# Patient Record
Sex: Female | Born: 1970 | Race: White | Hispanic: No | Smoking: Current every day smoker
Health system: Southern US, Community
[De-identification: ages and names within clinical notes are randomized; demographics above are authoritative.]

## PROBLEM LIST (undated history)

## (undated) DIAGNOSIS — F32A Depression, unspecified: Secondary | ICD-10-CM

## (undated) DIAGNOSIS — F329 Major depressive disorder, single episode, unspecified: Secondary | ICD-10-CM

## (undated) DIAGNOSIS — I214 Non-ST elevation (NSTEMI) myocardial infarction: Secondary | ICD-10-CM

## (undated) DIAGNOSIS — K08409 Partial loss of teeth, unspecified cause, unspecified class: Secondary | ICD-10-CM

## (undated) DIAGNOSIS — Z9889 Other specified postprocedural states: Secondary | ICD-10-CM

## (undated) DIAGNOSIS — F319 Bipolar disorder, unspecified: Secondary | ICD-10-CM

## (undated) DIAGNOSIS — J449 Chronic obstructive pulmonary disease, unspecified: Secondary | ICD-10-CM

## (undated) DIAGNOSIS — Z5189 Encounter for other specified aftercare: Secondary | ICD-10-CM

## (undated) DIAGNOSIS — F419 Anxiety disorder, unspecified: Secondary | ICD-10-CM

## (undated) DIAGNOSIS — K219 Gastro-esophageal reflux disease without esophagitis: Secondary | ICD-10-CM

## (undated) HISTORY — PX: OTHER SURGICAL HISTORY: SHX169

---

## 2001-05-16 ENCOUNTER — Other Ambulatory Visit: Admission: RE | Admit: 2001-05-16 | Discharge: 2001-05-16 | Payer: Self-pay | Admitting: Obstetrics and Gynecology

## 2001-10-25 ENCOUNTER — Ambulatory Visit (HOSPITAL_COMMUNITY): Admission: RE | Admit: 2001-10-25 | Discharge: 2001-10-25 | Payer: Self-pay | Admitting: Obstetrics and Gynecology

## 2001-11-22 ENCOUNTER — Ambulatory Visit (HOSPITAL_COMMUNITY): Admission: RE | Admit: 2001-11-22 | Discharge: 2001-11-22 | Payer: Self-pay | Admitting: Obstetrics and Gynecology

## 2001-12-05 ENCOUNTER — Inpatient Hospital Stay (HOSPITAL_COMMUNITY): Admission: AD | Admit: 2001-12-05 | Discharge: 2001-12-07 | Payer: Self-pay | Admitting: Obstetrics & Gynecology

## 2002-12-04 ENCOUNTER — Ambulatory Visit (HOSPITAL_COMMUNITY): Admission: RE | Admit: 2002-12-04 | Discharge: 2002-12-04 | Payer: Self-pay | Admitting: Family Medicine

## 2002-12-04 ENCOUNTER — Encounter: Payer: Self-pay | Admitting: Family Medicine

## 2004-01-08 ENCOUNTER — Ambulatory Visit (HOSPITAL_COMMUNITY): Admission: RE | Admit: 2004-01-08 | Discharge: 2004-01-08 | Payer: Self-pay | Admitting: Family Medicine

## 2004-01-23 ENCOUNTER — Ambulatory Visit (HOSPITAL_COMMUNITY): Admission: RE | Admit: 2004-01-23 | Discharge: 2004-01-23 | Payer: Self-pay | Admitting: Family Medicine

## 2004-06-22 DIAGNOSIS — Z5189 Encounter for other specified aftercare: Secondary | ICD-10-CM

## 2004-06-22 DIAGNOSIS — IMO0001 Reserved for inherently not codable concepts without codable children: Secondary | ICD-10-CM

## 2004-06-22 HISTORY — DX: Encounter for other specified aftercare: Z51.89

## 2004-06-22 HISTORY — DX: Reserved for inherently not codable concepts without codable children: IMO0001

## 2004-07-09 ENCOUNTER — Ambulatory Visit (HOSPITAL_COMMUNITY): Admission: AD | Admit: 2004-07-09 | Discharge: 2004-07-09 | Payer: Self-pay | Admitting: Obstetrics and Gynecology

## 2004-08-07 ENCOUNTER — Observation Stay (HOSPITAL_COMMUNITY): Admission: AD | Admit: 2004-08-07 | Discharge: 2004-08-08 | Payer: Self-pay | Admitting: Obstetrics and Gynecology

## 2004-08-19 ENCOUNTER — Ambulatory Visit (HOSPITAL_COMMUNITY): Admission: AD | Admit: 2004-08-19 | Discharge: 2004-08-19 | Payer: Self-pay | Admitting: Obstetrics and Gynecology

## 2004-09-01 ENCOUNTER — Observation Stay (HOSPITAL_COMMUNITY): Admission: AD | Admit: 2004-09-01 | Discharge: 2004-09-02 | Payer: Self-pay | Admitting: Obstetrics and Gynecology

## 2004-09-24 ENCOUNTER — Ambulatory Visit (HOSPITAL_COMMUNITY): Admission: RE | Admit: 2004-09-24 | Discharge: 2004-09-24 | Payer: Self-pay | Admitting: Obstetrics and Gynecology

## 2004-10-08 ENCOUNTER — Inpatient Hospital Stay (HOSPITAL_COMMUNITY): Admission: AD | Admit: 2004-10-08 | Discharge: 2004-10-10 | Payer: Self-pay | Admitting: Obstetrics and Gynecology

## 2004-11-09 ENCOUNTER — Emergency Department (HOSPITAL_COMMUNITY): Admission: EM | Admit: 2004-11-09 | Discharge: 2004-11-09 | Payer: Self-pay | Admitting: Emergency Medicine

## 2004-11-14 ENCOUNTER — Ambulatory Visit: Payer: Self-pay | Admitting: Family Medicine

## 2004-11-27 ENCOUNTER — Ambulatory Visit: Payer: Self-pay | Admitting: Orthopedic Surgery

## 2004-12-02 ENCOUNTER — Encounter (HOSPITAL_COMMUNITY): Admission: RE | Admit: 2004-12-02 | Discharge: 2005-01-01 | Payer: Self-pay | Admitting: Orthopedic Surgery

## 2004-12-25 ENCOUNTER — Ambulatory Visit: Payer: Self-pay | Admitting: Orthopedic Surgery

## 2005-01-06 ENCOUNTER — Encounter (HOSPITAL_COMMUNITY): Admission: RE | Admit: 2005-01-06 | Discharge: 2005-02-05 | Payer: Self-pay | Admitting: Orthopedic Surgery

## 2005-01-08 ENCOUNTER — Ambulatory Visit: Payer: Self-pay | Admitting: Family Medicine

## 2005-01-20 HISTORY — PX: ORIF PROXIMAL HUMERUS FRACTURE: SUR951

## 2005-02-05 ENCOUNTER — Ambulatory Visit: Payer: Self-pay | Admitting: Orthopedic Surgery

## 2005-02-11 ENCOUNTER — Encounter: Payer: Self-pay | Admitting: Orthopedic Surgery

## 2005-02-11 ENCOUNTER — Observation Stay (HOSPITAL_COMMUNITY): Admission: RE | Admit: 2005-02-11 | Discharge: 2005-02-12 | Payer: Self-pay | Admitting: Orthopedic Surgery

## 2005-02-12 ENCOUNTER — Ambulatory Visit: Payer: Self-pay | Admitting: Orthopedic Surgery

## 2005-02-16 ENCOUNTER — Ambulatory Visit: Payer: Self-pay | Admitting: Orthopedic Surgery

## 2005-02-18 ENCOUNTER — Encounter (HOSPITAL_COMMUNITY): Admission: RE | Admit: 2005-02-18 | Discharge: 2005-03-20 | Payer: Self-pay | Admitting: Orthopedic Surgery

## 2005-02-25 ENCOUNTER — Ambulatory Visit: Payer: Self-pay | Admitting: Orthopedic Surgery

## 2005-03-23 ENCOUNTER — Encounter (HOSPITAL_COMMUNITY): Admission: RE | Admit: 2005-03-23 | Discharge: 2005-04-22 | Payer: Self-pay | Admitting: Orthopedic Surgery

## 2005-04-01 ENCOUNTER — Ambulatory Visit: Payer: Self-pay | Admitting: Orthopedic Surgery

## 2005-04-28 ENCOUNTER — Encounter (HOSPITAL_COMMUNITY): Admission: RE | Admit: 2005-04-28 | Discharge: 2005-05-28 | Payer: Self-pay | Admitting: Orthopedic Surgery

## 2005-05-13 ENCOUNTER — Ambulatory Visit: Payer: Self-pay | Admitting: Orthopedic Surgery

## 2005-06-30 ENCOUNTER — Encounter (HOSPITAL_COMMUNITY): Admission: RE | Admit: 2005-06-30 | Discharge: 2005-07-30 | Payer: Self-pay | Admitting: Orthopedic Surgery

## 2005-08-10 ENCOUNTER — Ambulatory Visit: Payer: Self-pay | Admitting: Orthopedic Surgery

## 2005-09-09 ENCOUNTER — Ambulatory Visit: Payer: Self-pay | Admitting: Orthopedic Surgery

## 2005-10-16 ENCOUNTER — Ambulatory Visit (HOSPITAL_COMMUNITY): Admission: RE | Admit: 2005-10-16 | Discharge: 2005-10-16 | Payer: Self-pay | Admitting: Family Medicine

## 2005-10-16 ENCOUNTER — Ambulatory Visit: Payer: Self-pay | Admitting: Family Medicine

## 2006-03-08 ENCOUNTER — Ambulatory Visit: Payer: Self-pay | Admitting: Orthopedic Surgery

## 2006-03-22 DIAGNOSIS — K08409 Partial loss of teeth, unspecified cause, unspecified class: Secondary | ICD-10-CM

## 2006-03-22 HISTORY — PX: MULTIPLE TOOTH EXTRACTIONS: SHX2053

## 2006-03-22 HISTORY — DX: Partial loss of teeth, unspecified cause, unspecified class: K08.409

## 2006-04-22 ENCOUNTER — Ambulatory Visit: Payer: Self-pay | Admitting: Orthopedic Surgery

## 2006-04-27 ENCOUNTER — Ambulatory Visit: Payer: Self-pay | Admitting: Orthopedic Surgery

## 2006-04-27 ENCOUNTER — Ambulatory Visit (HOSPITAL_COMMUNITY): Admission: RE | Admit: 2006-04-27 | Discharge: 2006-04-27 | Payer: Self-pay | Admitting: Orthopedic Surgery

## 2006-04-27 HISTORY — PX: OTHER SURGICAL HISTORY: SHX169

## 2006-04-29 ENCOUNTER — Ambulatory Visit: Payer: Self-pay | Admitting: Orthopedic Surgery

## 2006-05-06 ENCOUNTER — Ambulatory Visit: Payer: Self-pay | Admitting: Orthopedic Surgery

## 2006-06-29 ENCOUNTER — Ambulatory Visit: Payer: Self-pay | Admitting: Orthopedic Surgery

## 2006-07-02 ENCOUNTER — Encounter (HOSPITAL_COMMUNITY): Admission: RE | Admit: 2006-07-02 | Discharge: 2006-08-01 | Payer: Self-pay | Admitting: Orthopedic Surgery

## 2006-08-02 ENCOUNTER — Encounter (HOSPITAL_COMMUNITY): Admission: RE | Admit: 2006-08-02 | Discharge: 2006-09-01 | Payer: Self-pay | Admitting: Orthopedic Surgery

## 2006-08-24 ENCOUNTER — Ambulatory Visit: Payer: Self-pay | Admitting: Orthopedic Surgery

## 2006-09-08 ENCOUNTER — Ambulatory Visit: Admission: RE | Admit: 2006-09-08 | Discharge: 2006-09-08 | Payer: Self-pay | Admitting: Orthopedic Surgery

## 2006-10-20 ENCOUNTER — Ambulatory Visit: Payer: Self-pay | Admitting: Orthopedic Surgery

## 2007-01-26 ENCOUNTER — Ambulatory Visit: Payer: Self-pay | Admitting: Family Medicine

## 2007-02-09 ENCOUNTER — Ambulatory Visit: Payer: Self-pay | Admitting: Orthopedic Surgery

## 2007-02-11 ENCOUNTER — Ambulatory Visit (HOSPITAL_COMMUNITY): Admission: RE | Admit: 2007-02-11 | Discharge: 2007-02-11 | Payer: Self-pay | Admitting: Orthopedic Surgery

## 2007-02-17 ENCOUNTER — Ambulatory Visit: Payer: Self-pay | Admitting: Orthopedic Surgery

## 2007-02-23 ENCOUNTER — Ambulatory Visit: Payer: Self-pay | Admitting: Family Medicine

## 2007-03-02 ENCOUNTER — Encounter (HOSPITAL_COMMUNITY): Admission: RE | Admit: 2007-03-02 | Discharge: 2007-03-22 | Payer: Self-pay | Admitting: Family Medicine

## 2007-03-24 ENCOUNTER — Encounter (HOSPITAL_COMMUNITY): Admission: RE | Admit: 2007-03-24 | Discharge: 2007-04-23 | Payer: Self-pay | Admitting: Family Medicine

## 2007-06-23 ENCOUNTER — Encounter: Payer: Self-pay | Admitting: Family Medicine

## 2007-11-28 ENCOUNTER — Ambulatory Visit: Payer: Self-pay | Admitting: Family Medicine

## 2007-12-02 ENCOUNTER — Encounter: Payer: Self-pay | Admitting: Family Medicine

## 2007-12-02 DIAGNOSIS — M542 Cervicalgia: Secondary | ICD-10-CM | POA: Insufficient documentation

## 2007-12-02 DIAGNOSIS — F411 Generalized anxiety disorder: Secondary | ICD-10-CM | POA: Insufficient documentation

## 2007-12-02 DIAGNOSIS — F319 Bipolar disorder, unspecified: Secondary | ICD-10-CM

## 2007-12-02 LAB — CONVERTED CEMR LAB
BUN: 8 mg/dL (ref 6–23)
CO2: 26 meq/L (ref 19–32)
Calcium: 9.2 mg/dL (ref 8.4–10.5)
Chloride: 102 meq/L (ref 96–112)
Creatinine, Ser: 0.7 mg/dL (ref 0.40–1.20)
Glucose, Bld: 111 mg/dL — ABNORMAL HIGH (ref 70–99)

## 2007-12-07 ENCOUNTER — Ambulatory Visit (HOSPITAL_COMMUNITY): Admission: RE | Admit: 2007-12-07 | Discharge: 2007-12-07 | Payer: Self-pay | Admitting: Family Medicine

## 2007-12-12 ENCOUNTER — Encounter (HOSPITAL_COMMUNITY): Admission: RE | Admit: 2007-12-12 | Discharge: 2008-01-11 | Payer: Self-pay | Admitting: Family Medicine

## 2008-02-07 ENCOUNTER — Ambulatory Visit: Payer: Self-pay | Admitting: Orthopedic Surgery

## 2008-02-07 DIAGNOSIS — S42213A Unspecified displaced fracture of surgical neck of unspecified humerus, initial encounter for closed fracture: Secondary | ICD-10-CM

## 2008-02-07 DIAGNOSIS — G5620 Lesion of ulnar nerve, unspecified upper limb: Secondary | ICD-10-CM

## 2008-02-07 DIAGNOSIS — M25529 Pain in unspecified elbow: Secondary | ICD-10-CM | POA: Insufficient documentation

## 2008-02-08 ENCOUNTER — Ambulatory Visit (HOSPITAL_COMMUNITY): Admission: RE | Admit: 2008-02-08 | Discharge: 2008-02-08 | Payer: Self-pay | Admitting: Orthopedic Surgery

## 2008-02-08 ENCOUNTER — Encounter: Payer: Self-pay | Admitting: Orthopedic Surgery

## 2008-02-16 ENCOUNTER — Other Ambulatory Visit: Admission: RE | Admit: 2008-02-16 | Discharge: 2008-02-16 | Payer: Self-pay | Admitting: Obstetrics and Gynecology

## 2008-02-21 ENCOUNTER — Encounter: Payer: Self-pay | Admitting: Orthopedic Surgery

## 2008-02-21 ENCOUNTER — Telehealth: Payer: Self-pay | Admitting: Orthopedic Surgery

## 2008-02-21 ENCOUNTER — Ambulatory Visit (HOSPITAL_COMMUNITY): Admission: RE | Admit: 2008-02-21 | Discharge: 2008-02-21 | Payer: Self-pay | Admitting: Orthopedic Surgery

## 2008-03-12 ENCOUNTER — Ambulatory Visit: Payer: Self-pay | Admitting: Orthopedic Surgery

## 2008-04-25 ENCOUNTER — Ambulatory Visit: Payer: Self-pay | Admitting: Orthopedic Surgery

## 2008-04-25 ENCOUNTER — Telehealth: Payer: Self-pay | Admitting: Orthopedic Surgery

## 2008-04-25 DIAGNOSIS — M7710 Lateral epicondylitis, unspecified elbow: Secondary | ICD-10-CM | POA: Insufficient documentation

## 2008-05-23 ENCOUNTER — Telehealth: Payer: Self-pay | Admitting: Orthopedic Surgery

## 2008-06-13 ENCOUNTER — Ambulatory Visit: Payer: Self-pay | Admitting: Orthopedic Surgery

## 2008-07-16 ENCOUNTER — Telehealth: Payer: Self-pay | Admitting: Orthopedic Surgery

## 2008-07-23 ENCOUNTER — Ambulatory Visit: Payer: Self-pay | Admitting: Family Medicine

## 2008-07-23 DIAGNOSIS — J209 Acute bronchitis, unspecified: Secondary | ICD-10-CM

## 2008-07-23 DIAGNOSIS — J019 Acute sinusitis, unspecified: Secondary | ICD-10-CM

## 2008-07-24 ENCOUNTER — Ambulatory Visit (HOSPITAL_COMMUNITY): Admission: RE | Admit: 2008-07-24 | Discharge: 2008-07-24 | Payer: Self-pay | Admitting: Family Medicine

## 2008-07-30 ENCOUNTER — Telehealth: Payer: Self-pay | Admitting: Family Medicine

## 2008-08-15 ENCOUNTER — Telehealth: Payer: Self-pay | Admitting: Orthopedic Surgery

## 2008-08-17 ENCOUNTER — Inpatient Hospital Stay (HOSPITAL_COMMUNITY): Admission: EM | Admit: 2008-08-17 | Discharge: 2008-08-25 | Payer: Self-pay | Admitting: Emergency Medicine

## 2008-08-17 ENCOUNTER — Ambulatory Visit: Payer: Self-pay | Admitting: Family Medicine

## 2008-08-17 DIAGNOSIS — R1011 Right upper quadrant pain: Secondary | ICD-10-CM | POA: Insufficient documentation

## 2008-08-22 ENCOUNTER — Ambulatory Visit: Payer: Self-pay | Admitting: Gastroenterology

## 2008-08-22 ENCOUNTER — Encounter: Payer: Self-pay | Admitting: Family Medicine

## 2008-08-23 ENCOUNTER — Ambulatory Visit: Payer: Self-pay | Admitting: Internal Medicine

## 2008-08-24 ENCOUNTER — Encounter: Payer: Self-pay | Admitting: Gastroenterology

## 2008-08-27 ENCOUNTER — Encounter: Payer: Self-pay | Admitting: Gastroenterology

## 2008-08-31 ENCOUNTER — Encounter: Payer: Self-pay | Admitting: Family Medicine

## 2008-08-31 ENCOUNTER — Encounter: Payer: Self-pay | Admitting: Orthopedic Surgery

## 2008-09-04 ENCOUNTER — Encounter: Payer: Self-pay | Admitting: Gastroenterology

## 2008-09-13 ENCOUNTER — Ambulatory Visit: Payer: Self-pay | Admitting: Gastroenterology

## 2008-09-13 DIAGNOSIS — R197 Diarrhea, unspecified: Secondary | ICD-10-CM

## 2008-09-13 DIAGNOSIS — R740 Nonspecific elevation of levels of transaminase and lactic acid dehydrogenase [LDH]: Secondary | ICD-10-CM

## 2008-09-13 DIAGNOSIS — R1084 Generalized abdominal pain: Secondary | ICD-10-CM

## 2008-09-13 DIAGNOSIS — R109 Unspecified abdominal pain: Secondary | ICD-10-CM | POA: Insufficient documentation

## 2008-09-13 DIAGNOSIS — D649 Anemia, unspecified: Secondary | ICD-10-CM

## 2008-09-14 ENCOUNTER — Encounter: Payer: Self-pay | Admitting: Gastroenterology

## 2008-09-18 ENCOUNTER — Ambulatory Visit (HOSPITAL_COMMUNITY): Admission: RE | Admit: 2008-09-18 | Discharge: 2008-09-18 | Payer: Self-pay | Admitting: Pulmonary Disease

## 2008-09-20 ENCOUNTER — Ambulatory Visit: Payer: Self-pay | Admitting: Gastroenterology

## 2008-09-20 ENCOUNTER — Ambulatory Visit (HOSPITAL_COMMUNITY): Admission: RE | Admit: 2008-09-20 | Discharge: 2008-09-20 | Payer: Self-pay | Admitting: Gastroenterology

## 2008-09-20 ENCOUNTER — Encounter: Payer: Self-pay | Admitting: Gastroenterology

## 2008-09-20 LAB — CONVERTED CEMR LAB
ALT: 13 units/L (ref 0–35)
AST: 19 units/L (ref 0–37)
Albumin: 4.6 g/dL (ref 3.5–5.2)
Alkaline Phosphatase: 71 units/L (ref 39–117)
CD4 T Helper %: 57 % (ref 32–62)
Total Bilirubin: 0.3 mg/dL (ref 0.3–1.2)
WBC, lymph enumeration: 10.4 10*3/uL (ref 4.0–10.5)

## 2009-01-16 ENCOUNTER — Ambulatory Visit: Payer: Self-pay | Admitting: Gastroenterology

## 2009-01-16 DIAGNOSIS — Z8639 Personal history of other endocrine, nutritional and metabolic disease: Secondary | ICD-10-CM

## 2009-01-16 DIAGNOSIS — Z862 Personal history of diseases of the blood and blood-forming organs and certain disorders involving the immune mechanism: Secondary | ICD-10-CM

## 2009-01-21 ENCOUNTER — Encounter: Payer: Self-pay | Admitting: Gastroenterology

## 2009-01-22 ENCOUNTER — Encounter: Payer: Self-pay | Admitting: Gastroenterology

## 2009-01-22 DIAGNOSIS — R7989 Other specified abnormal findings of blood chemistry: Secondary | ICD-10-CM | POA: Insufficient documentation

## 2009-01-22 LAB — CONVERTED CEMR LAB
AST: 13 units/L (ref 0–37)
Alkaline Phosphatase: 64 units/L (ref 39–117)
Basophils Relative: 1 % (ref 0–1)
Eosinophils Absolute: 0 10*3/uL (ref 0.0–0.7)
Hemoglobin: 13.3 g/dL (ref 12.0–15.0)
Lymphs Abs: 2 10*3/uL (ref 0.7–4.0)
MCHC: 33.3 g/dL (ref 30.0–36.0)
MCV: 101.8 fL — ABNORMAL HIGH (ref 78.0–100.0)
Monocytes Absolute: 0.4 10*3/uL (ref 0.1–1.0)
Monocytes Relative: 7 % (ref 3–12)
Neutro Abs: 4.1 10*3/uL (ref 1.7–7.7)
Neutrophils Relative %: 63 % (ref 43–77)
RBC: 3.93 M/uL (ref 3.87–5.11)
Total Bilirubin: 0.2 mg/dL — ABNORMAL LOW (ref 0.3–1.2)
WBC: 6.6 10*3/uL (ref 4.0–10.5)

## 2009-03-22 ENCOUNTER — Emergency Department (HOSPITAL_COMMUNITY): Admission: EM | Admit: 2009-03-22 | Discharge: 2009-03-22 | Payer: Self-pay | Admitting: Emergency Medicine

## 2009-04-05 ENCOUNTER — Ambulatory Visit (HOSPITAL_COMMUNITY): Admission: RE | Admit: 2009-04-05 | Discharge: 2009-04-05 | Payer: Self-pay | Admitting: Obstetrics and Gynecology

## 2009-04-05 ENCOUNTER — Other Ambulatory Visit: Admission: RE | Admit: 2009-04-05 | Discharge: 2009-04-05 | Payer: Self-pay | Admitting: Obstetrics and Gynecology

## 2010-03-08 IMAGING — CT CT ANGIO CHEST
1 of 6 series · 19 of 36 positions shown · IV contrast (Omnipaque 300)
Comparison: Plain films chest 08/17/2008 and 10/16/2005.

CLINICAL DATA: Right-sided chest pain.  Pneumonia.  Tobacco abuse.

CT ANGIOGRAPHY CHEST
TECHNIQUE: Multidetector CT imaging of the chest using the
standard protocol during bolus administration of intravenous
contrast. Multiplanar reconstructed images including MIPs were
obtained and reviewed to evaluate the vascular anatomy.
Contrast: 80 ml Smnipaque-AQQ.

[Series 11: thin pacs 1mm · axial · 0.59mm/px · z∈[-266,+12]mm · 19 of 310 slices shown]
[im 16/310  lung]
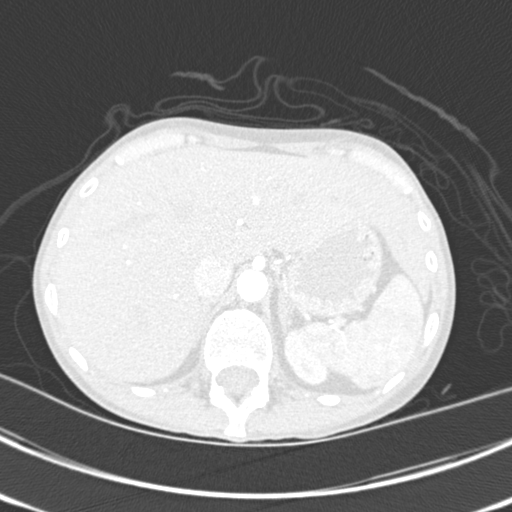
[im 31/310  mediastinal]
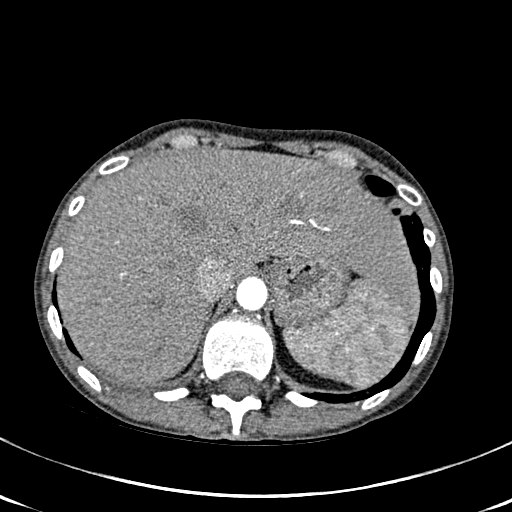
[im 47/310  lung]
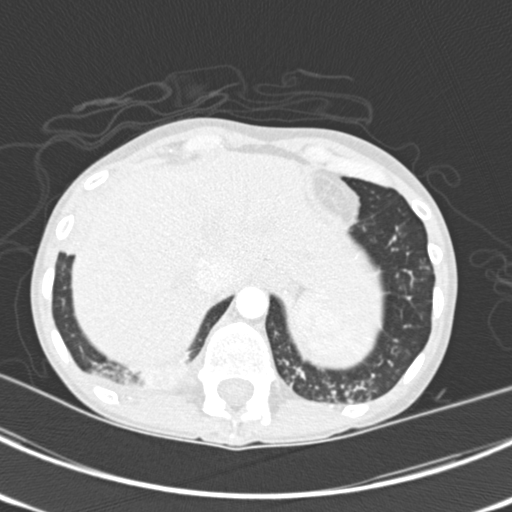
[im 62/310  mediastinal]
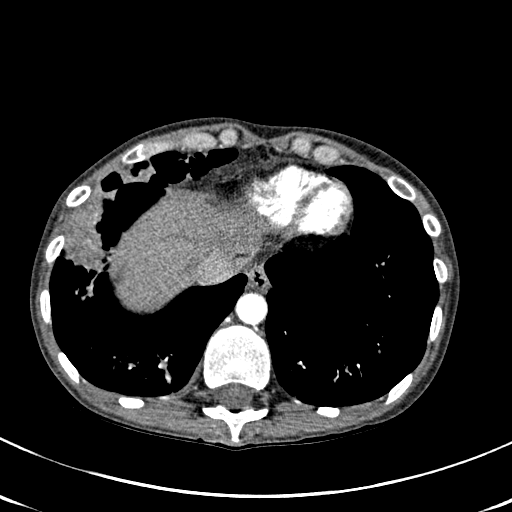
[im 78/310  lung]
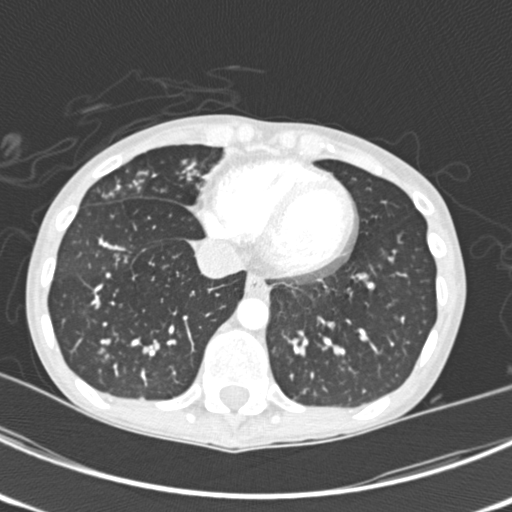
[im 93/310  mediastinal]
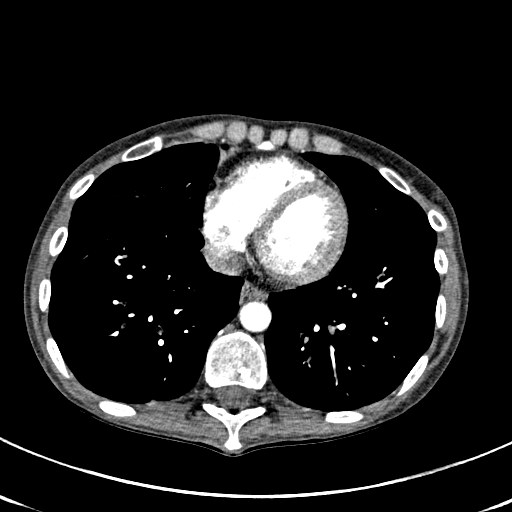
[im 109/310  lung]
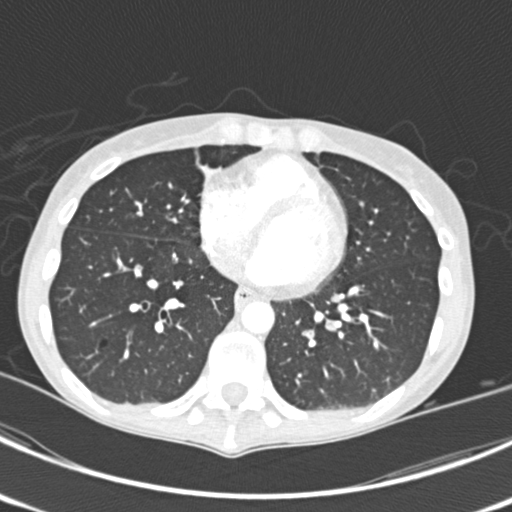
[im 124/310  mediastinal]
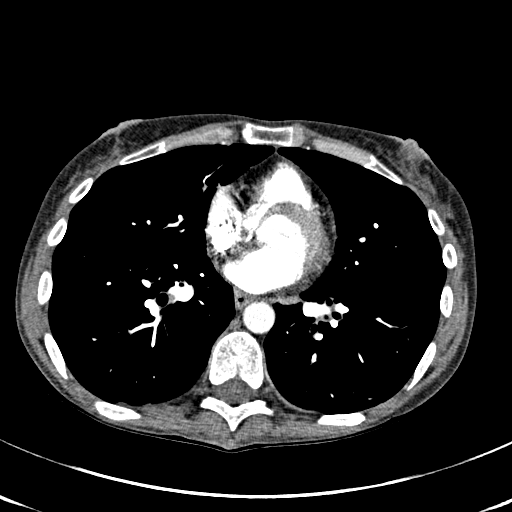
[im 140/310  lung]
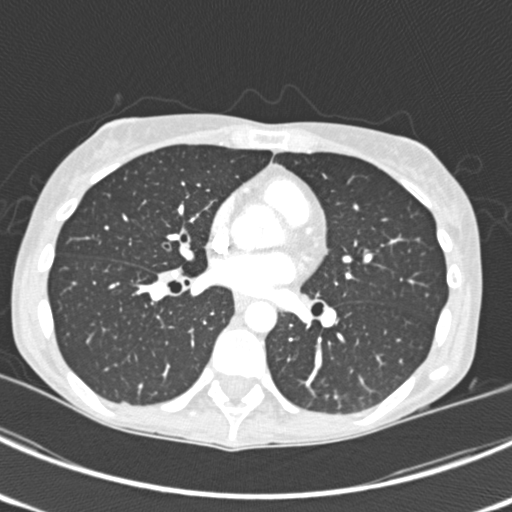
[im 155/310  mediastinal]
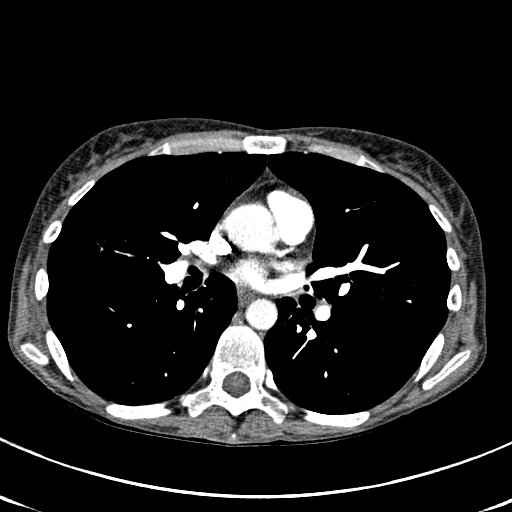
[im 170/310  lung]
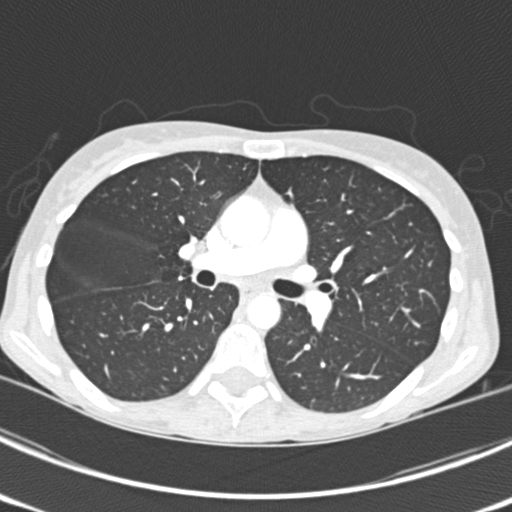
[im 186/310  mediastinal]
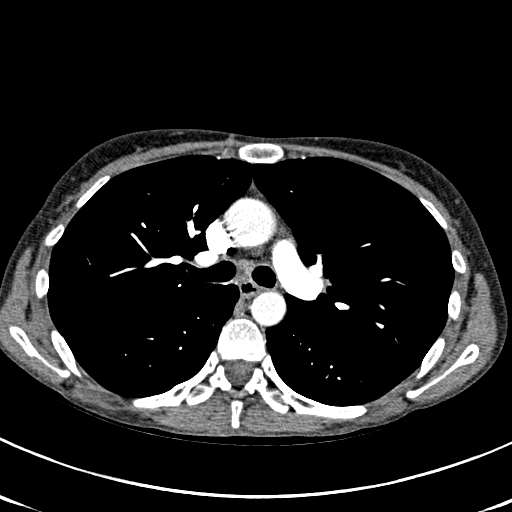
[im 201/310  lung]
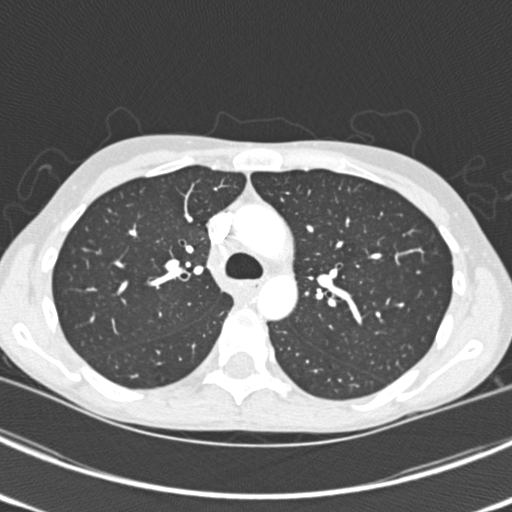
[im 217/310  mediastinal]
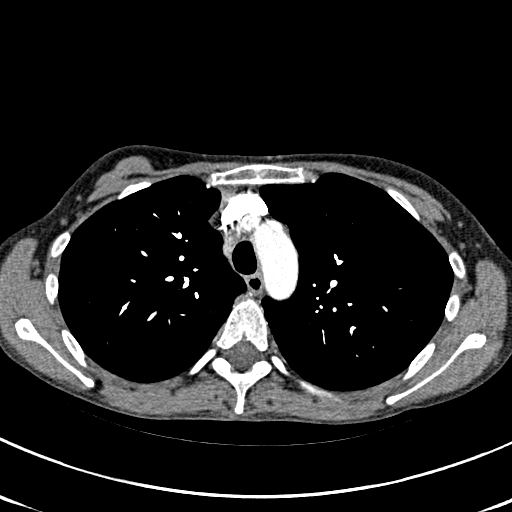
[im 232/310  lung]
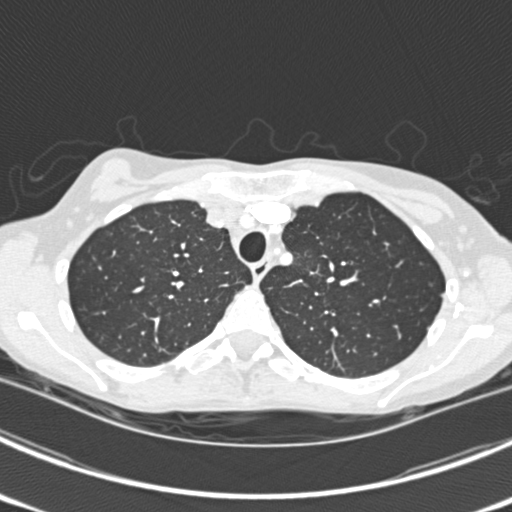
[im 248/310  mediastinal]
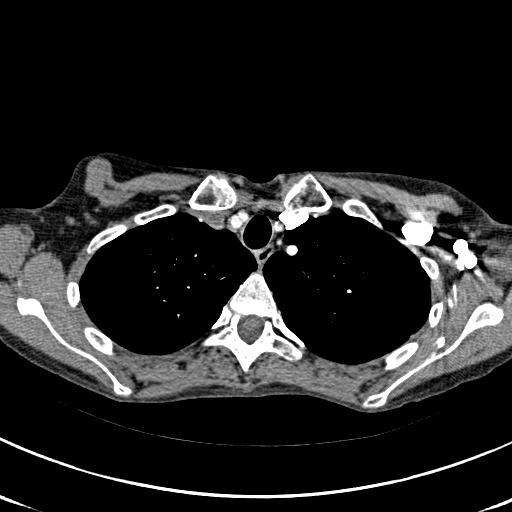
[im 263/310  lung]
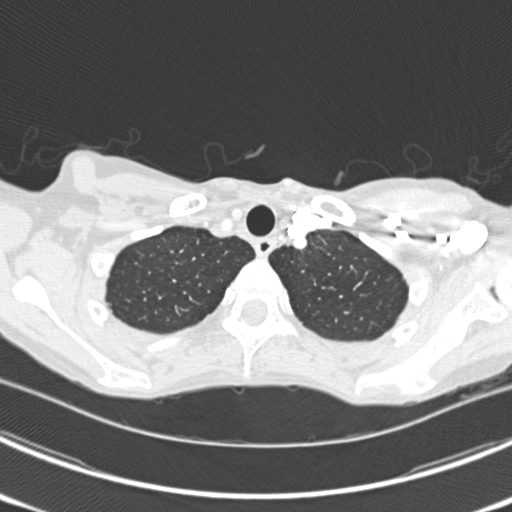
[im 279/310  mediastinal]
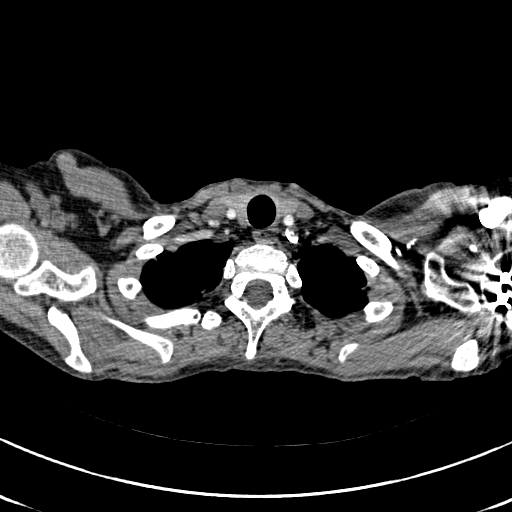
[im 294/310  lung]
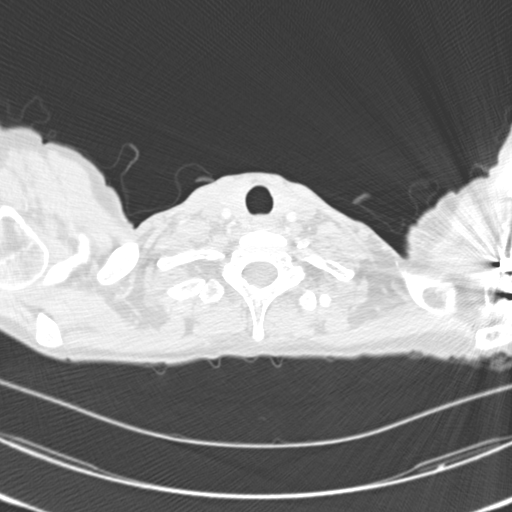

[19 of 36 positions shown; findings below may reference images not displayed]

FINDINGS: There is no CT evidence of pulmonary embolus.  No pleural
or pericardial effusion.  Heart size is normal.  No axillary, hilar
or mediastinal lymphadenopathy.

The lungs demonstrate changes of early emphysema.  There is
airspace disease in the right middle and lower lobes consistent
with pneumonia.  Mild degree of airspace opacity is seen in the
left lower lobe posteriorly.  Peribronchial thickening with mucous
plugging is best seen in the left lower lobe.

Incidentally imaged upper abdomen is unremarkable.
IMPRESSION: 1.  Negative for pulmonary embolus.
2.  Right middle lower lobe airspace disease consistent with
pneumonia.  Recommend plain film follow-up to clearing.
3.  Bronchitic change and mucus plugging best seen in the left
lower lobe.

## 2010-03-10 IMAGING — US US ABDOMEN COMPLETE
1 series · 13 of 25 positions shown · non-contrast
Comparison: 12/07/2007

CLINICAL DATA: Abdominal pain.

ABDOMEN ULTRASOUND
TECHNIQUE: Complete abdominal ultrasound examination was performed
including evaluation of the liver, gallbladder, bile ducts,
pancreas, kidneys, spleen, IVC, and abdominal aorta.

[Series 1: us abdomen complete · 0.28mm/px · 13 of 74 slices shown]
[im 1/74]
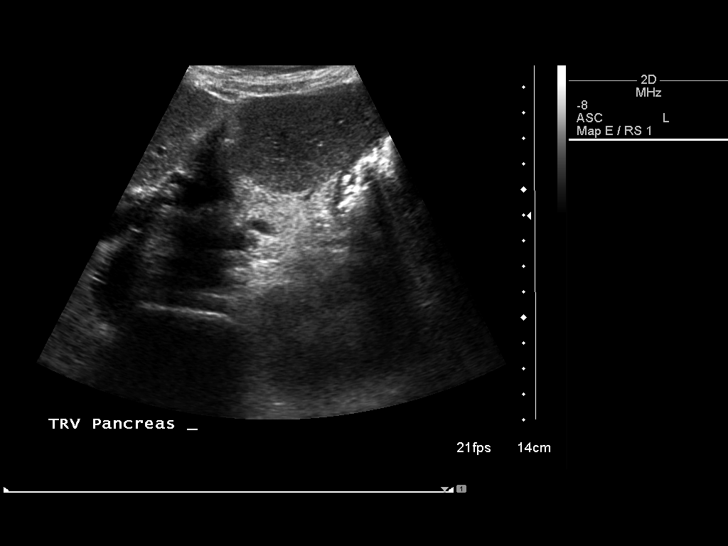
[im 7/74]
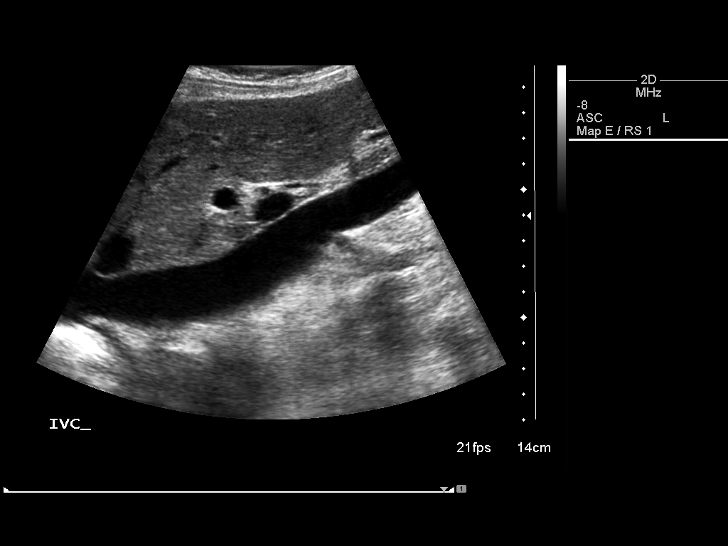
[im 13/74]
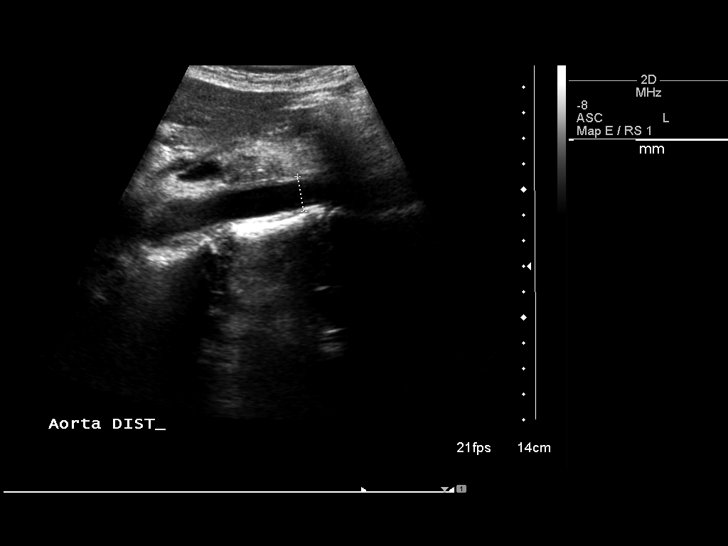
[im 19/74]
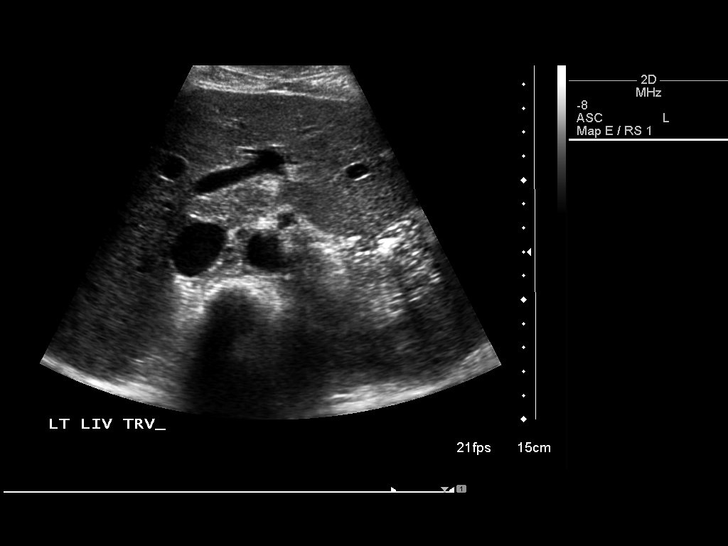
[im 25/74]
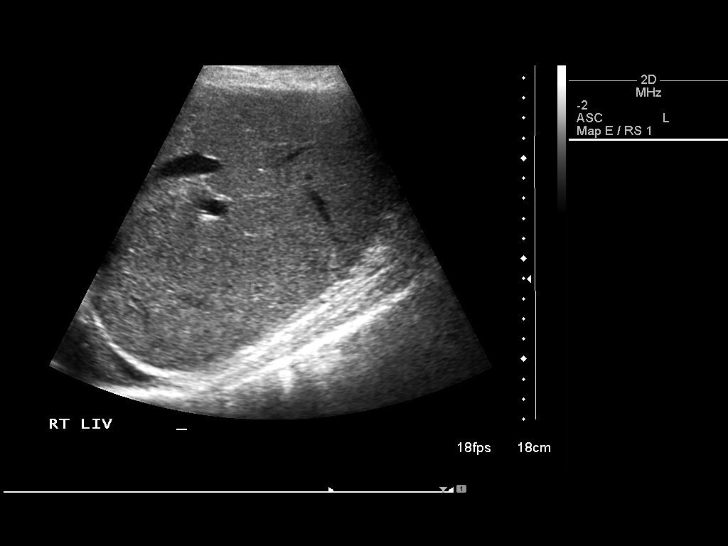
[im 31/74]
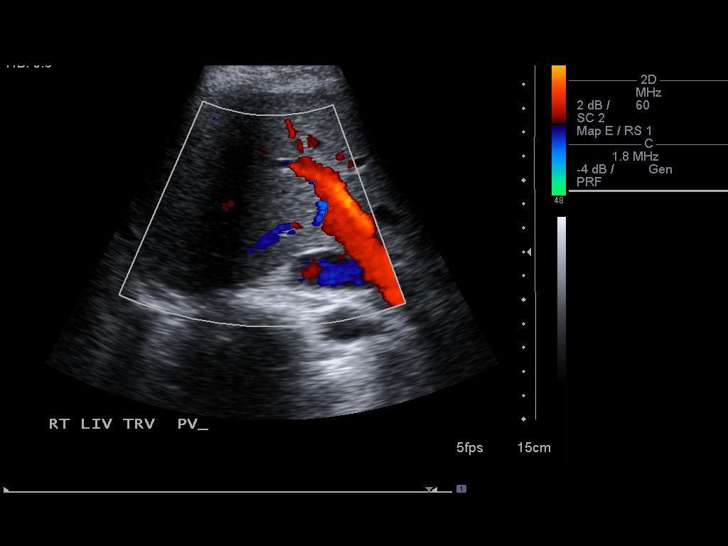
[im 37/74]
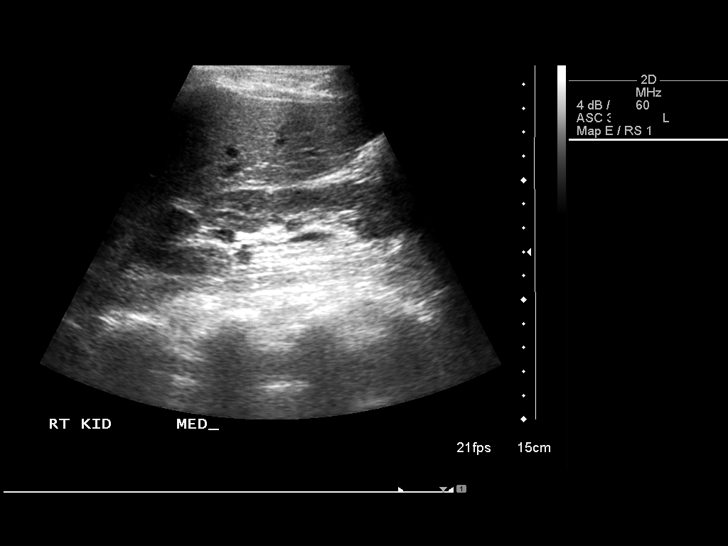
[im 43/74]
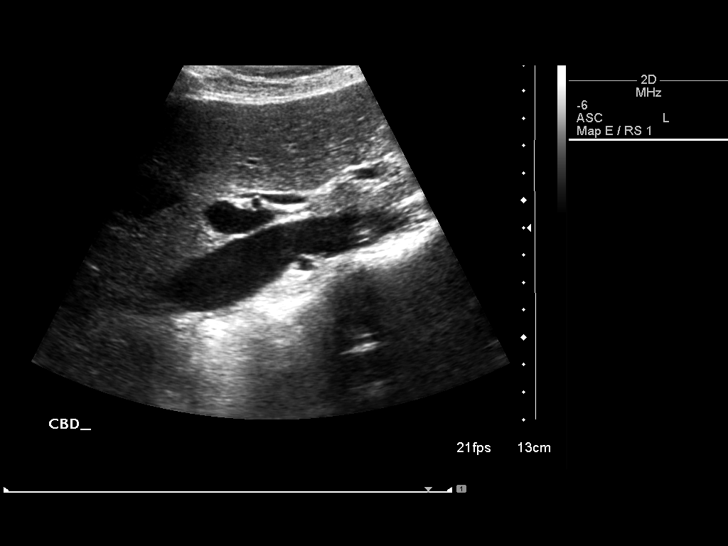
[im 49/74]
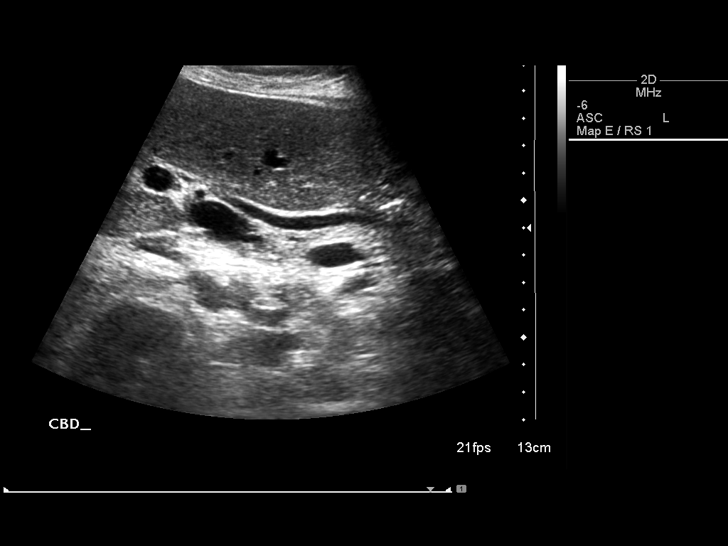
[im 55/74]
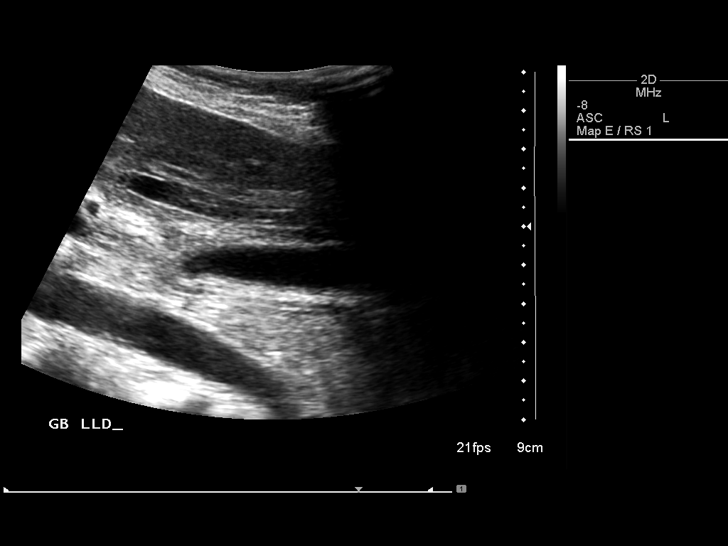
[im 61/74]
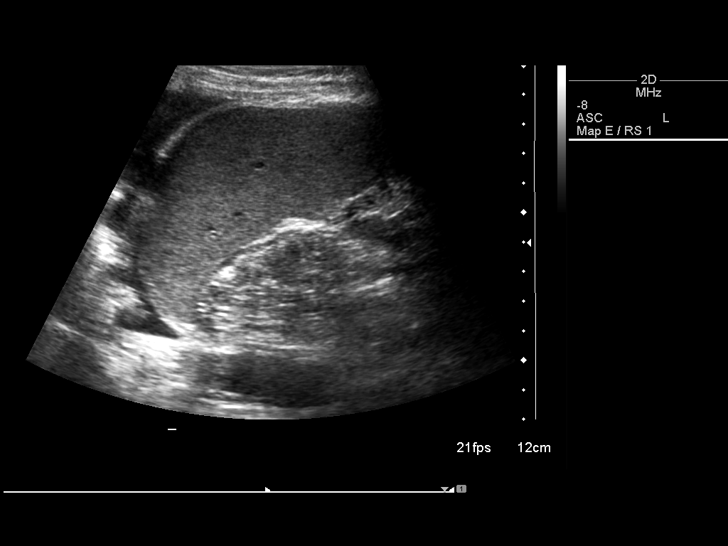
[im 67/74]
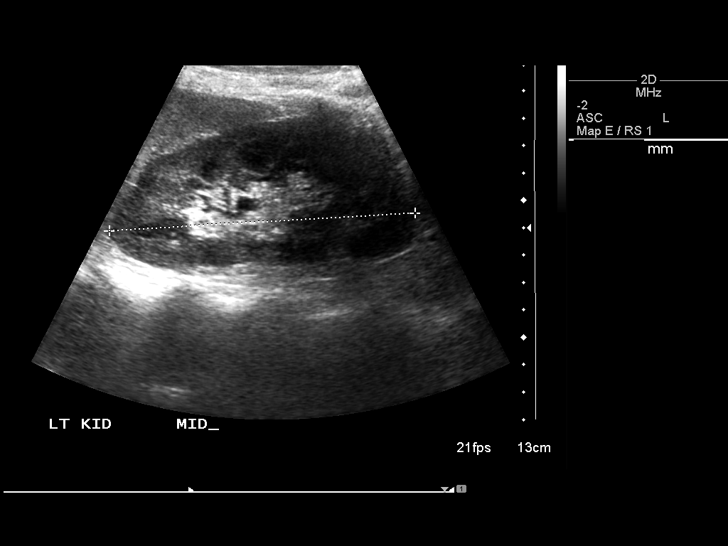
[im 74/74]
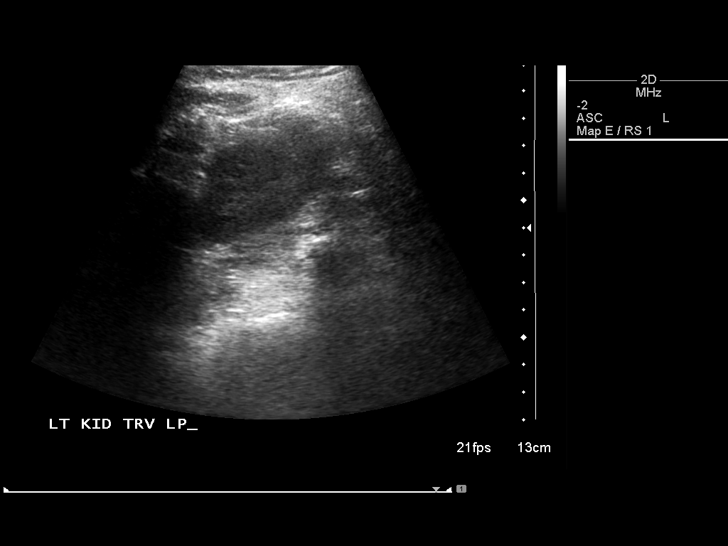

[13 of 25 positions shown; findings below may reference images not displayed]

FINDINGS: The liver is sonographically unremarkable.  No focal
lesions or intrahepatic biliary dilatation.  The common bile duct
is normal in caliber measuring 4.5mm.  The gallbladder appears
partially contracted.  The wall is mildly thickened measuring
mm.  No gallstones or pericholecystic fluid.  Negative sonographic
Murphy's sign.

The pancreas is fairly well visualized and demonstrates no
sonographic abnormalities.  The IVC and aorta are normal in
caliber.

The spleen is normal in size demonstrates normal echogenicity
without focal lesions.  The right kidney measures 12.8 cm and the
left kidney measures 11.2 cm.  Normal renal cortical thickness and
echogenicity.  A parapelvic cyst is noted on the left side.  No
hydronephrosis.
IMPRESSION: 1.  The contracted gallbladder with associated wall thickening.  No
gallstones or pericholecystic fluid.
2.  Normal caliber common bile duct.
3.  Normal sonographic appearance of the liver, pancreas and
spleen.
4.  Parapelvic cyst associated the left kidney.  No hydronephrosis.
5.  Bilateral pleural effusions.

## 2010-04-01 ENCOUNTER — Emergency Department (HOSPITAL_COMMUNITY): Admission: EM | Admit: 2010-04-01 | Discharge: 2010-04-01 | Payer: Self-pay | Admitting: Emergency Medicine

## 2010-07-24 NOTE — Letter (Signed)
Summary: demo  demo   Imported By: Lind Guest 02/05/2010 13:48:48  _____________________________________________________________________  External Attachment:    Type:   Image     Comment:   External Document

## 2010-07-24 NOTE — Letter (Signed)
Summary: history & physical  history & physical   Imported By: Lind Guest 02/05/2010 13:49:26  _____________________________________________________________________  External Attachment:    Type:   Image     Comment:   External Document

## 2010-07-24 NOTE — Letter (Signed)
Summary: phone notes  phone notes   Imported By: Lind Guest 02/05/2010 13:52:03  _____________________________________________________________________  External Attachment:    Type:   Image     Comment:   External Document

## 2010-07-24 NOTE — Letter (Signed)
Summary: misc  misc   Imported By: Lind Guest 02/05/2010 13:51:06  _____________________________________________________________________  External Attachment:    Type:   Image     Comment:   External Document

## 2010-07-24 NOTE — Letter (Signed)
Summary: office notes  office notes   Imported By: Lind Guest 02/05/2010 13:49:51  _____________________________________________________________________  External Attachment:    Type:   Image     Comment:   External Document

## 2010-07-24 NOTE — Letter (Signed)
Summary: labs  labs   Imported By: Lind Guest 02/05/2010 13:54:00  _____________________________________________________________________  External Attachment:    Type:   Image     Comment:   External Document

## 2010-07-24 NOTE — Letter (Signed)
Summary: x rays  x rays   Imported By: Lind Guest 02/05/2010 13:53:18  _____________________________________________________________________  External Attachment:    Type:   Image     Comment:   External Document

## 2010-07-24 NOTE — Letter (Signed)
Summary: consults  consults   Imported By: Lind Guest 02/05/2010 13:52:52  _____________________________________________________________________  External Attachment:    Type:   Image     Comment:   External Document

## 2010-09-25 LAB — DIFFERENTIAL
Basophils Relative: 0 % (ref 0–1)
Eosinophils Absolute: 0 10*3/uL (ref 0.0–0.7)
Eosinophils Relative: 0 % (ref 0–5)
Monocytes Absolute: 0.5 10*3/uL (ref 0.1–1.0)
Monocytes Relative: 5 % (ref 3–12)
Neutro Abs: 8.3 10*3/uL — ABNORMAL HIGH (ref 1.7–7.7)

## 2010-09-25 LAB — CBC
HCT: 34.1 % — ABNORMAL LOW (ref 36.0–46.0)
Hemoglobin: 12 g/dL (ref 12.0–15.0)
MCHC: 35.1 g/dL (ref 30.0–36.0)
MCV: 99.3 fL (ref 78.0–100.0)
RBC: 3.44 MIL/uL — ABNORMAL LOW (ref 3.87–5.11)
RDW: 13.5 % (ref 11.5–15.5)

## 2010-09-25 LAB — POCT CARDIAC MARKERS
CKMB, poc: <1 ng/mL — ABNORMAL LOW (ref 1.0–8.0)
Troponin i, poc: <0.05 ng/mL (ref 0.00–0.09)

## 2010-09-25 LAB — BASIC METABOLIC PANEL
CO2: 23 mEq/L (ref 19–32)
Chloride: 110 mEq/L (ref 96–112)
GFR calc Af Amer: >60 mL/min (ref >60)
Glucose, Bld: 79 mg/dL (ref 70–99)
Sodium: 141 mEq/L (ref 135–145)

## 2010-10-02 LAB — DIFFERENTIAL
Basophils Absolute: 0 10*3/uL (ref 0.0–0.1)
Basophils Absolute: 0 10*3/uL (ref 0.0–0.1)
Basophils Relative: 1 % (ref 0–1)
Basophils Relative: 1 % (ref 0–1)
Basophils Relative: 1 % (ref 0–1)
Eosinophils Absolute: 0.3 10*3/uL (ref 0.0–0.7)
Eosinophils Relative: 3 % (ref 0–5)
Eosinophils Relative: 5 % (ref 0–5)
Lymphocytes Relative: 31 % (ref 12–46)
Lymphs Abs: 1 10*3/uL (ref 0.7–4.0)
Monocytes Relative: 11 % (ref 3–12)
Monocytes Relative: 8 % (ref 3–12)
Neutro Abs: 2.8 10*3/uL (ref 1.7–7.7)
Neutro Abs: 4 10*3/uL (ref 1.7–7.7)
Neutrophils Relative %: 52 % (ref 43–77)
Neutrophils Relative %: 57 % (ref 43–77)
Neutrophils Relative %: 82 % — ABNORMAL HIGH (ref 43–77)

## 2010-10-02 LAB — FUNGUS CULTURE, BLOOD
Culture: NO GROWTH
Report Status: 3102010
Report Status: 3102010

## 2010-10-02 LAB — COMPREHENSIVE METABOLIC PANEL
ALT: 43 U/L — ABNORMAL HIGH (ref 0–35)
Alkaline Phosphatase: 120 U/L — ABNORMAL HIGH (ref 39–117)
Alkaline Phosphatase: 120 U/L — ABNORMAL HIGH (ref 39–117)
Alkaline Phosphatase: 147 U/L — ABNORMAL HIGH (ref 39–117)
BUN: 10 mg/dL (ref 6–23)
BUN: 6 mg/dL (ref 6–23)
CO2: 26 mEq/L (ref 19–32)
CO2: 26 mEq/L (ref 19–32)
Chloride: 102 mEq/L (ref 96–112)
Chloride: 108 mEq/L (ref 96–112)
Creatinine, Ser: 0.71 mg/dL (ref 0.4–1.2)
GFR calc non Af Amer: >60 mL/min (ref >60)
Glucose, Bld: 120 mg/dL — ABNORMAL HIGH (ref 70–99)
Glucose, Bld: 81 mg/dL (ref 70–99)
Glucose, Bld: 85 mg/dL (ref 70–99)
Potassium: 4.6 mEq/L (ref 3.5–5.1)
Potassium: 5.3 mEq/L — ABNORMAL HIGH (ref 3.5–5.1)
Potassium: 5.5 mEq/L — ABNORMAL HIGH (ref 3.5–5.1)
Sodium: 139 mEq/L (ref 135–145)
Total Bilirubin: 0.2 mg/dL — ABNORMAL LOW (ref 0.3–1.2)
Total Bilirubin: 0.3 mg/dL (ref 0.3–1.2)
Total Protein: 5.7 g/dL — ABNORMAL LOW (ref 6.0–8.3)
Total Protein: 6 g/dL (ref 6.0–8.3)

## 2010-10-02 LAB — GLIADIN ANTIBODIES, SERUM: Gliadin IgG: 0.1 U/mL (ref <7)

## 2010-10-02 LAB — CBC
HCT: 28 % — ABNORMAL LOW (ref 36.0–46.0)
HCT: 29.8 % — ABNORMAL LOW (ref 36.0–46.0)
HCT: 37.7 % (ref 36.0–46.0)
Hemoglobin: 10.3 g/dL — ABNORMAL LOW (ref 12.0–15.0)
Hemoglobin: 11 g/dL — ABNORMAL LOW (ref 12.0–15.0)
Hemoglobin: 12.4 g/dL (ref 12.0–15.0)
Hemoglobin: 9.6 g/dL — ABNORMAL LOW (ref 12.0–15.0)
MCV: 100.8 fL — ABNORMAL HIGH (ref 78.0–100.0)
MCV: 99.9 fL (ref 78.0–100.0)
Platelets: 187 10*3/uL (ref 150–400)
RBC: 2.77 MIL/uL — ABNORMAL LOW (ref 3.87–5.11)
RBC: 3.22 MIL/uL — ABNORMAL LOW (ref 3.87–5.11)
RBC: 3.74 MIL/uL — ABNORMAL LOW (ref 3.87–5.11)
RDW: 15.3 % (ref 11.5–15.5)
RDW: 15.3 % (ref 11.5–15.5)
WBC: 3.3 10*3/uL — ABNORMAL LOW (ref 4.0–10.5)
WBC: 4.9 10*3/uL (ref 4.0–10.5)

## 2010-10-02 LAB — BASIC METABOLIC PANEL
BUN: 2 mg/dL — ABNORMAL LOW (ref 6–23)
GFR calc Af Amer: >60 mL/min (ref >60)
GFR calc non Af Amer: >60 mL/min (ref >60)
Potassium: 4.1 mEq/L (ref 3.5–5.1)
Sodium: 140 mEq/L (ref 135–145)

## 2010-10-02 LAB — HIV ANTIBODY (ROUTINE TESTING W REFLEX): HIV: NONREACTIVE

## 2010-10-02 LAB — OCCULT BLOOD (STOOL CUP TO LAB): Fecal Occult Bld: POSITIVE

## 2010-10-02 LAB — IRON AND TIBC
Iron: 39 ug/dL — ABNORMAL LOW (ref 42–135)
TIBC: 312 ug/dL (ref 250–470)

## 2010-10-02 LAB — RETICULIN ANTIBODIES, IGA W TITER

## 2010-10-02 LAB — TISSUE TRANSGLUTAMINASE, IGA: Tissue Transglutaminase Ab, IgA: 0.2 U/mL (ref <7)

## 2010-10-02 LAB — VITAMIN B12: Vitamin B-12: 459 pg/mL (ref 211–911)

## 2010-10-02 LAB — T-HELPER CELLS (CD4) COUNT (NOT AT ARMC): CD4 % Helper T Cell: 37 % (ref 33–55)

## 2010-10-02 LAB — HEMOCCULT GUIAC POC 1CARD (OFFICE): Fecal Occult Bld: NEGATIVE

## 2010-10-02 LAB — LIPASE, BLOOD: Lipase: 23 U/L (ref 11–59)

## 2010-10-02 LAB — RPR: RPR Ser Ql: NONREACTIVE

## 2010-10-07 LAB — DIFFERENTIAL
Basophils Relative: 0 % (ref 0–1)
Basophils Relative: 1 % (ref 0–1)
Eosinophils Absolute: 0.1 10*3/uL (ref 0.0–0.7)
Eosinophils Absolute: 0.1 10*3/uL (ref 0.0–0.7)
Eosinophils Relative: 2 % (ref 0–5)
Eosinophils Relative: 2 % (ref 0–5)
Lymphs Abs: 0.7 10*3/uL (ref 0.7–4.0)
Monocytes Absolute: 0.4 10*3/uL (ref 0.1–1.0)
Monocytes Absolute: 0.4 10*3/uL (ref 0.1–1.0)
Monocytes Relative: 4 % (ref 3–12)
Monocytes Relative: 6 % (ref 3–12)
Monocytes Relative: 7 % (ref 3–12)
Neutro Abs: 7.8 10*3/uL — ABNORMAL HIGH (ref 1.7–7.7)
Neutrophils Relative %: 68 % (ref 43–77)
Neutrophils Relative %: 68 % (ref 43–77)
Neutrophils Relative %: 87 % — ABNORMAL HIGH (ref 43–77)

## 2010-10-07 LAB — BASIC METABOLIC PANEL
BUN: 3 mg/dL — ABNORMAL LOW (ref 6–23)
CO2: 24 mEq/L (ref 19–32)
Calcium: 8.9 mg/dL (ref 8.4–10.5)
Chloride: 105 mEq/L (ref 96–112)
Chloride: 107 mEq/L (ref 96–112)
Creatinine, Ser: 0.44 mg/dL (ref 0.4–1.2)
Creatinine, Ser: 0.59 mg/dL (ref 0.4–1.2)
GFR calc Af Amer: >60 mL/min (ref >60)
Potassium: 4.2 mEq/L (ref 3.5–5.1)
Sodium: 138 mEq/L (ref 135–145)

## 2010-10-07 LAB — LIPASE, BLOOD: Lipase: 13 U/L (ref 11–59)

## 2010-10-07 LAB — MAGNESIUM: Magnesium: 2.2 mg/dL (ref 1.5–2.5)

## 2010-10-07 LAB — CULTURE, RESPIRATORY W GRAM STAIN

## 2010-10-07 LAB — CULTURE, BLOOD (ROUTINE X 2)
Report Status: 3032010
Report Status: 3032010

## 2010-10-07 LAB — CBC
HCT: 29.1 % — ABNORMAL LOW (ref 36.0–46.0)
Hemoglobin: 11.7 g/dL — ABNORMAL LOW (ref 12.0–15.0)
MCHC: 33.5 g/dL (ref 30.0–36.0)
MCV: 100.9 fL — ABNORMAL HIGH (ref 78.0–100.0)
MCV: 102.5 fL — ABNORMAL HIGH (ref 78.0–100.0)
MCV: 102.7 fL — ABNORMAL HIGH (ref 78.0–100.0)
Platelets: 189 10*3/uL (ref 150–400)
RBC: 2.68 MIL/uL — ABNORMAL LOW (ref 3.87–5.11)
RBC: 3.43 MIL/uL — ABNORMAL LOW (ref 3.87–5.11)
WBC: 5.1 10*3/uL (ref 4.0–10.5)
WBC: 8.9 10*3/uL (ref 4.0–10.5)

## 2010-10-07 LAB — BLOOD GAS, ARTERIAL
Acid-base deficit: 5 mmol/L — ABNORMAL HIGH (ref 0.0–2.0)
FIO2: 21 %
O2 Saturation: 95.9 %
TCO2: 17.1 mmol/L (ref 0–100)
pCO2 arterial: 30.2 mmHg — ABNORMAL LOW (ref 35.0–45.0)

## 2010-10-07 LAB — FOLATE: Folate: 3.5 ng/mL

## 2010-10-07 LAB — VITAMIN B12: Vitamin B-12: 376 pg/mL (ref 211–911)

## 2010-10-07 LAB — HEPATIC FUNCTION PANEL
Indirect Bilirubin: 0.2 mg/dL — ABNORMAL LOW (ref 0.3–0.9)
Total Protein: 6.6 g/dL (ref 6.0–8.3)

## 2010-10-07 LAB — LEGIONELLA ANTIGEN, URINE

## 2010-10-07 LAB — COMPREHENSIVE METABOLIC PANEL
ALT: <8 U/L (ref 0–35)
AST: 9 U/L (ref 0–37)
Alkaline Phosphatase: 60 U/L (ref 39–117)
Glucose, Bld: 95 mg/dL (ref 70–99)
Potassium: 4.3 mEq/L (ref 3.5–5.1)
Sodium: 138 mEq/L (ref 135–145)
Total Protein: 5 g/dL — ABNORMAL LOW (ref 6.0–8.3)

## 2010-10-07 LAB — IRON AND TIBC: Iron: 11 ug/dL — ABNORMAL LOW (ref 42–135)

## 2010-10-07 LAB — EXPECTORATED SPUTUM ASSESSMENT W GRAM STAIN, RFLX TO RESP C

## 2010-10-07 LAB — FERRITIN: Ferritin: 47 ng/mL (ref 10–291)

## 2010-10-07 LAB — RAPID URINE DRUG SCREEN, HOSP PERFORMED

## 2010-10-07 LAB — CORTISOL: Cortisol, Plasma: 3.2 ug/dL

## 2010-11-04 NOTE — Group Therapy Note (Signed)
Crystal Stein, Crystal Stein             ACCOUNT NO.:  [id number]   MEDICAL RECORD NO.:  [id number]          PATIENT TYPE:  INP   LOCATION:  A330                          FACILITY:  [facility]   PHYSICIAN:  Edward L. Juanetta Gosling, M.D.DATE OF BIRTH:  Nov 07, 1970   DATE OF PROCEDURE:  DATE OF DISCHARGE:                                 PROGRESS NOTE   Ms. Crystal Stein is patient of the [employer] who was admitted  with COPD and pneumonia.  She also is a very thin and has somewhat dark  skin and she is being worked up for various forms of problems with her  adrenal glands which I think is appropriate.  She has had an HIV screen  which is negative.  She says that she feels better and has no new  complaints.   Her physical examination shows her vital signs are as recorded.  She  looks much more comfortable than she did yesterday.  She does not appear  to be dyspneic now.  She is of course very thin.  Her chest shows  rhonchi bilaterally, but not as much and there was more on the right  than on the left.   Assessment then that she is much more comfortable.  She looks like she  is feeling better and my plan would be for her to continue on her  medications and treatments with no changes.  I think she is getting  better.      Edward L. Juanetta Gosling, M.D.  Electronically Signed     ELH/MEDQ  D:  08/23/2008  T:  08/24/2008  Job:  [id number]

## 2010-11-04 NOTE — Group Therapy Note (Signed)
NAMETERRIANNE, Crystal Stein             ACCOUNT NO.:  [id number]   MEDICAL RECORD NO.:  [id number]          PATIENT TYPE:  INP   LOCATION:  A330                          FACILITY:  [facility]   PHYSICIAN:  Osvaldo Shipper, MD     DATE OF BIRTH:  19-Dec-1970   DATE OF PROCEDURE:  08/18/2008  DATE OF DISCHARGE:                                 PROGRESS NOTE   SUBJECTIVELY:  The patient is still experiencing a lot of pleuritic  chest pain on the right side.  She is experiencing cough, shortness of  breath, she is feeling weak.  Does not feel any better compared to  yesterday.  She denies any bleeding per rectum.  Denies any menstrual  periods at this time.  She denies any dizziness.   OBJECTIVELY:  Temperature 98.6, heart rate 90, respiratory rate is 22,  blood pressure is 83/68, it has been running low overnight, saturations  100% on room air.  GENERAL:  A thin, cachectic white female in discomfort but in no  distress.  HEENT:  There is no pallor, no icterus, oral mucous membranes moist,  oral lesions are noted.  NECK:  Soft and supple, no thyromegaly is appreciated.  LUNGS:  Reveal a few crackles at the right base but otherwise mostly  clear to auscultation.  CARDIOVASCULAR:  S1 - S2 is normal, regular, no murmurs appreciated, no  S3 - S4, no rubs, no bruits.  ABDOMEN:  Tender in the epigastric area, no rebound, rigidity or  guarding.  No tenderness in the right upper quadrant.  Bowel sounds are  present.  No masses or organomegaly is appreciated.  EXTREMITIES:  Show no edema.  MUSCULOSKELETAL:  Unremarkable.  NEUROLOGICALLY:  She is alert, oriented x3.  No focal neurological  deficits are present.   LABORATORY DATA:  Her white count is 5.1, hemoglobin is 9.1, a drop from  11.7 yesterday, MCV is 102.5, platelet count 174, potassium is 4.3,  renal function is normal, albumin is 2.3, total protein 5, LFTs are  normal, lipase is 14, TSH is normal, iron is 11, TIBC is 281, percent  sat is 4%,  B12 - 376, ferritin is 47.  Cultures are all pending.   ASSESSMENT AND PLAN:  1. Pleuritic chest pain with right lower lobe pneumonia.  She is      currently on Levaquin day two.  We will add parenteral narcotic      agent for pain control, although we have to be cautious with her      low blood pressure.  Cultures are all pending at this time.  She is      also on breathing treatments which we will continue.  2. Hypotension.  She is not really symptomatic, heart rate is not      elevated.  I wonder if she is chronically hypotensive.  I will give      her fluid bolus and increase the maintenance fluid.  I will also      check a cortisol level in the morning.  3. Anemia.  Quite a drop from yesterday, however, I think it was  mostly related to dilution.  We will recheck hemoglobin tomorrow.      There is no overt bleeding anywhere at this time.  H and H will be      repeated this afternoon.  4. Weight loss and hypoalbuminemia.  Urinalysis is pending to see if      she has proteinuria.  We will give her Ensure.  I think her low      albumin could be because of her acute illness.  We will follow this      carefully.  5. Macrocytosis.  TSH is normal, B12 is normal.  We will go ahead and      check folic acid level.   So patient still not improved much.  We will monitor her blood pressure  very closely, monitor her hemoglobins very closely.  Continue with  current treatment otherwise and reevaluate her on a daily basis.      Osvaldo Shipper, MD  Electronically Signed     GK/MEDQ  D:  08/18/2008  T:  08/18/2008  Job:  [id number]   cc:   Dr. Lodema Hong

## 2010-11-04 NOTE — Consult Note (Signed)
NAMEYESLIN, Crystal Stein             ACCOUNT NO.:  [id number]   MEDICAL RECORD NO.:  [id number]          PATIENT TYPE:  INP   LOCATION:  A330                          FACILITY:  [facility]   PHYSICIAN:  Barbaraann Barthel, M.D. DATE OF BIRTH:  1971/04/26   DATE OF CONSULTATION:  08/22/2008  DATE OF DISCHARGE:                                 CONSULTATION   NOTE:  Surgery was asked to see this 40 year old white female for right  upper quadrant pain.  The Medical Service was concerned that possibly  there was a gallbladder disease with this patient who presents with a  respiratory problem.   In essence chief complaint is that of essentially continuous right upper  quadrant pain, weight loss, and history of previous pneumonia in  February.  My clinical impression after examining the patient and  reviewing her labs and studies is that her right upper quadrant pain is  caused by her right lower lobe pneumonia.  Her pain is pleuritic in  nature, increases on deep inspiration, and she also has dullness to  percussion and some mildly diminished breath sounds at the right base.  The patient does have a history of pneumonia.  She was treated for this  with penicillin by Dr. Lodema Hong according to her in February.  She states  that she has never really quite got over these symptoms.  She has been  admitted now with an exacerbation of the same.  She did have a CT scan  which showed the right lower lobe pneumonia.  The sonogram of the  gallbladder shows a collapsed gallbladder without stones and a normal  biliary tree.  Liver function studies are also not impressive with AST  of 45, and an ALT of 43, and an alkaline phosphatase of 120.   PHYSICAL EXAMINATION:  GENERAL:  The patient looks and appears  chronically ill.  She is almost appears as if she might be immuno  compromised.  VITAL SIGNS:  Her temperature is 98.0.  Her blood pressure is 90/61,  heart rate is 70, and respirations are 16.  HEAD:   Normocephalic.  EYES:  There is bilateral conjunctive pallor.  NOSE:  Oral mucosa were somewhat dry.  She has dental prosthesis.  NECK:  Jugular veins are fine.  There are no bruits auscultated.  No  cervical adenopathy and no masses or thyromegaly.  CHEST:  As discussed, there is dullness to percussion in the right lower  chest with some mildly diminished breath sounds.  CARDIAC:  The heart rate is regular.  The breasts are small.  No masses  palpated in the axilla bilaterally.  ABDOMEN:  Bowel sounds are present.  There are no masses palpated.  RECTAL:  I did not repeat the rectal exam.  She had stool sent for  guaiac testing.  EXTREMITIES:  There is no peripheral edema.  The patient states she  suffers from the cold and actually has 2 pairs of socks [place]  although she is lying in bed.   REVIEW OF SYSTEMS:  CARDIORESPIRATORY SYSTEM:  The patient has a history  of asthma.  She does  smoke.  She had a recent history of pneumonia.  She  has had a productive cough.  Cultures have been sent.  Blood cultures  have been negative and sputum cultures have not been diagnostic.  She  did have some Candida albicans noted in 1 respiratory culture.  ENDOCRINE SYSTEM:  No history of diabetes or thyroid disease.  GU  SYSTEM:  She has had previous urinary tract infection.  She has no  dysuria at present.  No history of nephrolithiasis.  MUSCULOSKELETAL  SYSTEM:  Grossly within normal limits.  The patient has had left  shoulder and elbow operated on.  OB/GYN HISTORY.  She is a gravida 3,  para 3, abortus 0, cesarean 0 female whose last menstrual period she  states was last week.  She has no family history of carcinoma of the  breast.   IMPRESSION:  I have discussed this with Dr. Elige Radon.  I do not feel this  patient has any gallbladder problems.  I think her right upper quadrant  pain could be completely explained by her right lower lobe pneumonia.  She will be undergoing further diagnostic  testing.  I also shared my  thoughts about possibly needing an HIV test as this patient has been  chronically ill with a history of weight loss.  She states that she has  not been a drug user and she has had no previous transfusions.  At any  rate, the workup from this standpoint will be carried on by the  [facility].  She is not a surgical candidate, and I will follow  from a distance.  I thank you for the consultation.      Barbaraann Barthel, M.D.  Electronically Signed     WB/MEDQ  D:  08/22/2008  T:  08/23/2008  Job:  [id number]   cc:   [facility]   Dr. Lodema Hong

## 2010-11-04 NOTE — H&P (Signed)
Crystal Stein, Crystal Stein             ACCOUNT NO.:  [id number]   MEDICAL RECORD NO.:  [id number]          PATIENT TYPE:  EMS   LOCATION:  ED                            FACILITY:  [facility]   PHYSICIAN:  Osvaldo Shipper, MD     DATE OF BIRTH:  05-Mar-1971   DATE OF ADMISSION:  08/17/2008  DATE OF DISCHARGE:  LH                              HISTORY & PHYSICAL   PRIMARY CARE PHYSICIAN:  Dr. Milus Mallick. Crystal Stein.   ADMISSION DIAGNOSES:  1. Refractory community-acquired pneumonia.  2. Chest pain as a result of number one.  3. Weight loss, probably as the result of number one as well.   CHIEF COMPLAINT:  Right-sided chest pain for two days.   HISTORY OF PRESENT ILLNESS:  The patient is a 40 year old Caucasian  female who has a past medical history of asthma, who was in her usual  state of health until earlier this month, back on 07/23/2008, when she  was diagnosed with pneumonia, by her PMD.  She was given penicillin for  14 days, along with a prescription for prednisone and inhaler.  She  finished all of this medication and actually started feeling better.  At  that time she was having a cough with yellowish expectoration.  She did  have a son who was diagnosed with a Streptococcus throat about one week  ago.  Then about two nights ago she started experiencing chest pain in  the right chest.  The pain was a stabbing-like sensation, also noted in  the left side.  She had this pain more severely when breathing and when  coughing.  She has also noticed upper abdominal pain, as a result of her  cough.  Her symptoms are worse at night when she is lying down.  She is  also short of breath.  The cough has been dry this time.  Denies any  fever or chills.  No nausea or vomiting.   CURRENT HOME MEDICATIONS:  None.  She does have an inhaler that she can  use as needed.   ALLERGIES:  No known drug allergies.   PAST MEDICAL/SURGICAL HISTORY:  1. Diagnosed with chronic obstructive pulmonary disease and  asthma.  2. 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  3. She has also had tendon surgeries to the left arm.  No history of      diabetes or hypertension.   SOCIAL HISTORY:  Lives with her family in [place].  Is currently  unemployed.  Smokes one pack of cigarettes on a daily basis, with a 20-  pack-year history of smoking.  No alcohol use.  No illicit drug use.   FAMILY HISTORY:  Positive for peripheral artery disease, chronic  obstructive pulmonary disease and diabetes.   REVIEW OF SYSTEMS:  GENERAL:  Positive for weakness, malaise, weight  loss of over 6 pounds in the last two weeks.  HEENT:  Unremarkable.  CARDIOVASCULAR:  Unremarkable.  RESPIRATORY:  As in the HPI.  GI:  Unremarkable.  GENITOURINARY:  Unremarkable.  MUSCULOSKELETAL:  Unremarkable.  NEUROLOGIC:  Unremarkable.  PSYCHIATRIC:  Unremarkable.  DERMATOLOGY:  Unremarkable.  Other systems  unremarkable.   PHYSICAL EXAMINATION:  VITAL SIGNS:  Temperature 98.4 degrees, blood  pressure 103/73, heart rate 78, respirations 20, saturation 97% on room  air.  GENERAL:  This is a thin white female, in no distress, appears to be in  moderate discomfort.  HEENT:  No pallor, no icterus.  Oral mucous membranes moist.  No oral  lesions noticed.  NECK:  Soft, supple.  No thyromegaly is appreciated.  LUNGS:  Reveal a few rhonchi at the right base but no definite crackles  are heard.  No wheezing is present.  No dullness to percussion.  CARDIOVASCULAR:  S1 and S2 normal, regular, no murmurs appreciated.  No  rubs, no bruits.  ABDOMEN:  Soft, tenderness in the epigastric area.  No rebound, no  guarding.  No masses appreciated.  Bowel sounds are present.  GENITOURINARY:  Deferred.  EXTREMITIES:  No edema.  MUSCULOSKELETAL:  Unremarkable.  NEUROLOGIC:  She was alert, oriented x3.  No focal neurological deficits  at present.   LABORATORY DATA:  ABG shows a pH of 7.41, pCO2 of 30, pO2 of 81, bicarb  18, saturation 95%.  White  count 8.9, hemoglobin 11.7, MCV 100.9,  platelet count 233.  D-dimer 1.62, potassium 2.8.  Liver function tests:  Albumin, lipase and magnesium levels are pending.   She had a chest x-ray initially which showed evidence for increased  infiltration in the right lung base, suspect pneumonia.  She  subsequently had a CT angio which revealed right middle lower lobe air  space disease, consistent with pneumonia, bronchitic changes and mucus  plugging, also noted in the left lower lobe.   ASSESSMENT:  This is a 40 year old Caucasian female who presents with  right-sided chest pain and is found to have a pneumonia.  She was  apparently diagnosed with pneumonia on 07/23/2008, and was treated with  penicillin.  At this time she has refractory pneumonia which has failed  outpatient treatment.  She now needs to be admitted for IV antibiotics.   PLAN:  1. Community-acquired pneumonia, failing outpatient therapy:  Will      treat her with IV Levaquin.  She has already been given Avelox here      in the emergency department.  We will send urine for Streptococcus      and Legionella antigens.  Sputum cultures will be obtained if      possible; however, she does have a dry cough at this time.      Nebulizer treatments will be given q.6h.  2. Hypokalemia:  Replace via p.o. and IV routes.  Check a magnesium      level.  3. Anemia, macrocytic:  We will check iron profile studies, B12,      folate.  A TSH level will also be checked.  4. Upper abdominal pain:  Likely from her cough.  A PPI will be given.      Lipase will be checked.  Liver function tests will be checked.  5. Weight loss:  Likely a result of her infection.  In a 40 year old I      do not suspect any occult malignancy.  If she continues to lose      weight, this is something that the PMD will need to pursue.  6. Deep venous thrombosis prophylaxis:  Will be initiated.   Further management decisions will depend upon the results of further   testing and the patient's response to treatment.  For pain control we  will provide oxycodone.  Smoking cessation counseling was provided, and  I offered her nicotine patches; however, she is declining at this time.      Osvaldo Shipper, MD  Electronically Signed     GK/MEDQ  D:  08/17/2008  T:  08/17/2008  Job:  [id number]   cc:   Milus Mallick. Lodema Hong, M.D.  Fax: [phone]

## 2010-11-04 NOTE — Op Note (Signed)
NAMELAVANDA, Stein             ACCOUNT NO.:  [id number]   MEDICAL RECORD NO.:  [id number]          PATIENT TYPE:  AMB   LOCATION:  DAY                           FACILITY:  [facility]   PHYSICIAN:  Kassie Mends, M.D.      DATE OF BIRTH:  1971/03/13   DATE OF PROCEDURE:  09/20/2008  DATE OF DISCHARGE:                               OPERATIVE REPORT   REFERRING PHYSICIAN:  Edward L. Juanetta Gosling, M.D.   PROCEDURE:  1. Ileocolonoscopy.  2. Esophagogastroduodenoscopy with cold forceps biopsy of gastric      ulcer and duodenal mucosa.   INDICATIONS FOR PROCEDURE:  Crystal Stein is a 40 year old female who is  underweight and presents with a normocytic anemia.  She was recently  hospitalized for a pneumonia.   FINDINGS:  1. Normal terminal ileum, approximately 10 cm visualized.  2. Normal colon without evidence of polyps, masses, inflammatory      changes, diverticular AVMs.  3. Small internal hemorrhoids, otherwise normal retroflexed view of      the rectum.  4. Normal esophagus without evidence of Barrett's, mass, erosions,      ulceration or stricture.  5. Four gastric ulcers seen.  The gastric ulcers were 3 mm to 6 mm.      Biopsies were obtained via cold forceps to evaluate for      Helicobacter pylori gastritis.  6. Normal duodenal bulb and second portion of the duodenum.  Biopsies      obtained via cold forceps to evaluate for Helicobacter pylori,      celiac sprue as an etiology for her anemia.   DIAGNOSIS:  Gastric ulcers likely contributing to her anemia, biopsies  pending.  No other source for anemia identified.   RECOMMENDATIONS:  1. No aspirin or NSAIDs for 30 days.  She should continue her iron and      her omeprazole.  2. Will call her with the results of her biopsies.  She was given a      handout on ulcers.  3. She should follow a high-fiber diet.  She was given a handout on      high-fiber diet and hemorrhoids.  4. Screening colonoscopy at age 5.  Her next colonoscopy  should be      with propofol.  She required high doses of Demerol and Versed with      the addition of Phenergan to be adequately sedated.   MEDICATIONS:  1. Demerol 150 mg IV.  2. Versed 9 mg IV.  3. Phenergan 25 mg IV.   PROCEDURE TECHNIQUE:  Physical exam was performed.  Informed consent was  obtained from the patient after explaining the benefits, risks and  alternatives to the procedure.  The patient was connected to the monitor  and placed in left lateral position.  Continuous oxygen was provided by  nasal cannula and IV medicine administered through an indwelling  cannula.  After administration of sedation and rectal examination the  patient's rectum was intubated and the scope was advanced under direct  visualization to the distal terminal ileum.  The scope was removed  slowly by  carefully examining the color, texture, anatomy and integrity of the  mucosa on the way out.  The patient was recovered in endoscopy and  discharged home in satisfactory condition.   PATH:  Benign ulcers. Nl duodenum.      Kassie Mends, M.D.  Electronically Signed     SM/MEDQ  D:  09/20/2008  T:  09/20/2008  Job:  [id number]   cc:   Ramon Dredge L. Juanetta Gosling, M.D.  Fax: [phone]

## 2010-11-04 NOTE — Group Therapy Note (Signed)
Crystal Stein, Crystal Stein             ACCOUNT NO.:  [id number]   MEDICAL RECORD NO.:  [id number]          PATIENT TYPE:  INP   LOCATION:  A330                          FACILITY:  [facility]   PHYSICIAN:  Dorris Singh, DO    DATE OF BIRTH:  1971-01-16   DATE OF PROCEDURE:  08/23/2008  DATE OF DISCHARGE:  08/23/2008                                 PROGRESS NOTE   Clinically looks much improved.  She states she feels better.  We are  also testing her for adrenal insufficiency.  I asked questions about her  complexion, she said there have been some pigment changes but really she  has always been a darker complexed individual.  Her father has Native  American in him and he also has a darker complexion.  We will continue  to follow up on these tests.  GI is also seeing her and Dr. Juanetta Gosling is  also following her but clinically today she states she feels a little  bit better.  Her right-sided abdominal pain has improved some.   PHYSICAL EXAMINATION:  VITAL SIGNS:  Temperature 97.7, pulse 95,  respirations 16, blood pressure 82/58.  GENERAL:  The patient is a thin Caucasian female who is well-developed,  well-nourished, no acute stress.  HEART:  Regular rate and rhythm.  LUNGS:  Positive crackles right side with some pain with deep breathing.  ABDOMEN:  Flat, soft, nontender, nondistended.  No organomegaly noted.   LABS:  For today white count of 4.9, hemoglobin of 12.4, hematocrit  37.7, platelet count of 412.  Her sodium was 138, potassium 4.6,  chloride 102, CO2 - 26, glucose 120, BUN 10, creatinine 0.71.  Her AST  is 60 and her ALT is 59.  Her culture for fungus is negative, her HIV is  also negative and nonreactive and her hepatitis antibodies are all  negative and her B12 is negative.   ASSESSMENT/PLAN:  1. Pneumonia.  The patient seems to be improving.  2. Chronic obstructive pulmonary disease, will continue patient on the      nebulizer treatments as well as steroids.  Dr. Juanetta Gosling is on  her      case.  3. Her  malaise and weight loss.  We are checking her for adrenal      insufficiency.  4. Elevated liver enzymes.  The patient is being followed by      gastroenterology.   We will continue to monitor her and change therapy as necessary.      Dorris Singh, DO  Electronically Signed     CB/MEDQ  D:  08/23/2008  T:  08/23/2008  Job:  [id number]

## 2010-11-04 NOTE — Group Therapy Note (Signed)
NAMEBELLE, CHARLIE             ACCOUNT NO.:  [id number]   MEDICAL RECORD NO.:  [id number]         PATIENT TYPE:  PINP   LOCATION:  A330                          FACILITY:  [facility]   PHYSICIAN:  Edward L. Juanetta Gosling, M.D.DATE OF BIRTH:  September 28, 1970   DATE OF PROCEDURE:  08/24/2008  DATE OF DISCHARGE:                                 PROGRESS NOTE   Ms. Prak is admitted with COPD and pneumonia.  She is doing much  better.  She had some elevated liver function testing as well.  She is  very thin and has lost weight, and she is being noted for number of  potential causes of that.  She has had a death in the family and is  requesting to go the funeral and we have arranged for that to be done.  I think she is probably 24-48 hours being ready for discharge.  She does  need to continue with IV medications.      Edward L. Juanetta Gosling, M.D.  Electronically Signed     ELH/MEDQ  D:  08/24/2008  T:  08/24/2008  Job:  [id number]

## 2010-11-04 NOTE — Consult Note (Signed)
NAMEBRACHA, FRANKOWSKI             ACCOUNT NO.:  [id number]   MEDICAL RECORD NO.:  [id number]          PATIENT TYPE:  INP   LOCATION:  A330                          FACILITY:  [facility]   PHYSICIAN:  Edward L. Juanetta Gosling, M.D.DATE OF BIRTH:  Aug 10, 1970   DATE OF CONSULTATION:  DATE OF DISCHARGE:                                 CONSULTATION   A patient of the hospitalist.   REASON FOR CONSULTATION:  Pneumonia, COPD.   HISTORY:  Ms. Nobrega is a 40 year old who has had problems with cough  and congestion for about a month.  She had seen her primary care  physician on February 1 with cough and congestion and had a chest x-ray,  which showed a right basilar infiltrate.  She was started on antibiotics  at that time but did not seem to improve very much.  She had another  chest x-ray done on the 26th that showed increased infiltrate in the  right lower and right middle lobe.  She has continued to cough and be  congested and has continued to have trouble being short of breath.  She  has been hospitalized for this.  Chest x-ray now shows that she may have  some airspace opacity in the left lower lobe as well as the previous  right middle and lower lobe infiltrates.  She has grown Candida albicans  on her sputum culture.   Her past medical history is positive for bipolar disorder, but she is on  no medications.  She has had pneumonia 3 times in the past.  She has a  history of colon polyps.  She has had surgery on her shoulder and elbow.   Her family history is positive for COPD in both parents.  Her father  also has peripheral vascular disease.   SOCIAL HISTORY:  She has about a 30 pack-year smoking history.  She  stopped several days ago.  She has no significant use of alcohol.  No  use of any illicit drugs.   Her review of systems except as mentioned is negative.   Physical examination shows a very thin female who appears to be in  moderate distress and looks acutely ill.  Her pupils are  reactive.  Her  nose and throat are clear.  Her mucous membranes are moist.  Her chest  shows rhonchi bilaterally and minimal wheezes.  Her heart is regular  without gallop.  Her abdomen is soft.  No masses are felt.  Extremities  showed no edema.   Her laboratory work shows that her stool was heme positive.  Her  hemoglobin runs between about 9 and 11.  She has had mildly elevated  liver functions in her chest x-rays as mentioned.   My assessment is that she has multiple medical problems, mostly related  to her lungs now.  She is significantly underweight but now she has  pneumonia, which is on the left and right lung on multiple lobes.  I  agree with current antibiotic treatments, and I would have her get  started on some Mucinex and see if we can clear this up some,  continue  with all of her other treatments and because of her very  positive family history of COPD and her fairly young age with COPD, I  would see if we can get an alpha-1 antitrypsin level to make sure she  does not have deficiency.  Thanks for allowing me to see her with you.      Edward L. Juanetta Gosling, M.D.  Electronically Signed     ELH/MEDQ  D:  08/22/2008  T:  08/23/2008  Job:  [id number]

## 2010-11-04 NOTE — Group Therapy Note (Signed)
Crystal Stein, Crystal Stein             ACCOUNT NO.:  [id number]   MEDICAL RECORD NO.:  [id number]          PATIENT TYPE:  INP   LOCATION:  A330                          FACILITY:  [facility]   PHYSICIAN:  Dorris Singh, DO    DATE OF BIRTH:  [birth year]   DATE OF PROCEDURE:  08/22/2008  DATE OF DISCHARGE:                                 PROGRESS NOTE   The patient is a 40 year old Caucasian female who has been here  originally admitted for pneumonia.  On chest x-ray yesterday, they found  that she has more hyperinflation, possibly emphysema.  With this  knowledge, we will continue her on her current antibiotics but will also  start her on steroids as well.  She still complaining of having  difficulty breathing.  Also, the patient clinically looks very sick, and  she recently had her grandmother die, and there were some issues as to  whether not she wanted to go to see her.  She also mentioned that she  does have a history of bipolar disorder and has not been on any  medications and is asking to be put on something for anxiety as well as  her bipolar due to the fact that she is upset about her grandmother as  well.  I told the patient we were going to test her for some odd  diagnoses and check her for HIV and syphilis as well.  We will also get  Dr. Juanetta Gosling on her case due to her elevated liver enzymes.  She does  mention that she does take a lot of pain medications.  However, we will  get at a hepatitis panel as well as continuing to monitor her liver  enzymes in the morning.  I had a heart-to-heart discussion with the  patient and explained to her I was very concerned about how she  currently looks and that I was concerned that there might be something  more going on with her and explained to her that we needed to continue  investigation, if not here in the hospital, then would need to be  investigated outpatient with that Dr. Lodema Hong as well.  The patient  stated understanding and basically  apologizes for her actions over the  last couple of days.  She has had bouts she states of hat her bipolar  where she has gotten very angry and has been yelling at the staff and  wanting to leave, but today she states that if I can start her on some  medication that may help her as well, and she is ready to try to figure  out what is going on with her.  She is concerned about her weight loss  among many things.  We will continue to monitor her at this point in  time.   Her vitals for today are as follows:  Temperature 98, pulse 70,  respirations 16, blood pressure 90/61.  GENERAL:  The patient is a 38-year Caucasian female who looks clinically  sick.  HEAD:  Is normocephalic, atraumatic.  HEART:  Is regular rate and rhythm.  ABDOMEN:  Soft, nontender, nondistended, positive  CVA tenderness.  LUNGS:  Decreased breath sounds bilaterally.  EXTREMITIES:  Positive pulses.  No edema, ecchymosis or cyanosis.   Her labs for today are as follows:  White count of 5.3, hemoglobin 11.0,  hematocrit 32.5, platelet count 315.  Sodium 139, potassium 5.3,  chloride 107, CO2 26, glucose 85, BUN 8 and creatinine 0.63.  Alkaline  phosphatase 120, AST 45, ALT 43.  Fecal occult is positive.   ASSESSMENT AND PLAN:  1. Exacerbation of chronic obstructive pulmonary disease.  Will start      the patient on IV steroids and will continue to monitor her.  Will      also get Dr. Juanetta Gosling to consult and participate for any new      recommendations he may have regarding her.  2. Abnormal ultrasound of gallbladder.  Surgery has seen her.  They do      not feel like it is surgical at this point in time.  3. Hyperkalemia.  Will go ahead and give her 15 mg of Kayexalate and      change her IV to half normal saline.  4. Anemia.  She has heme positive.  However, the patient has mentioned      that she is on Depo-Provera and does bleed periodically and unsure      if she is bleeding now but will have GI come see her to  evaluate      her, also because of her elevated LFTs and alkaline phosphatase.      Will continue to monitor and change therapy as necessary.      Dorris Singh, DO  Electronically Signed     CB/MEDQ  D:  08/22/2008  T:  08/22/2008  Job:  [phone] [id number]

## 2010-11-04 NOTE — Group Therapy Note (Signed)
Crystal Stein, Crystal Stein             ACCOUNT NO.:  [id number]   MEDICAL RECORD NO.:  [id number]          PATIENT TYPE:  INP   LOCATION:  A330                          FACILITY:  [facility]   PHYSICIAN:  Edward L. Juanetta Gosling, M.D.DATE OF BIRTH:  10-31-1970   DATE OF PROCEDURE:  08/25/2008  DATE OF DISCHARGE:  08/25/2008                                 PROGRESS NOTE   Crystal Stein seems to be better.  She is still somewhat short of breath  when she moves around, but that is not too surprising considering that  she has COPD and pneumonia.   PHYSICAL EXAMINATION:  VITAL SIGNS:  This morning, her vital signs are  as recorded.  CHEST:  Clearer than it has been.  She still has some rhonchi.  ABDOMEN:  Soft.   ASSESSMENT:  She has chronic obstructive pulmonary disease and  pneumonia.  She is much improved and she wants to go home.  I have told  her that I think she is probably okay to go, if okay with attending  hospitalist team.  She is still being worked up for some other problems  and she need to be here for some of that, but at this point, I think  from a strictly pulmonary point of view, she can probably be discharged.      Edward L. Juanetta Gosling, M.D.  Electronically Signed     ELH/MEDQ  D:  08/26/2008  T:  08/26/2008  Job:  [id number]

## 2010-11-04 NOTE — Consult Note (Signed)
NAMEJAKHIA, BUXTON             ACCOUNT NO.:  [id number]   MEDICAL RECORD NO.:  [id number]          PATIENT TYPE:  INP   LOCATION:  A330                          FACILITY:  [facility]   PHYSICIAN:  Kassie Mends, M.D.      DATE OF BIRTH:  1970/12/24   DATE OF CONSULTATION:  08/22/2008  DATE OF DISCHARGE:                                 CONSULTATION   REFERRING PHYSICIAN:  Dorris Singh, DO   PRIMARY CARE PHYSICIAN:  Kerri Perches, MD   REASON FOR CONSULTATION:  Elevated liver enzymes.   HISTORY OF PRESENT ILLNESS:  Ms. Calmes is a 40 year old female who  presented with a week and a half of right-sided pain.  She had a cough  since July 23, 2008.  It was productive.  Chest x-ray on February 2  showed a questionable right basilar infiltrate.  She states she was  placed on penicillin and potassium.  She was also told she had a urinary  tract infection.  She had an ultrasound on February 18 which showed a  normal pancreas and liver with a common bile duct of 4.5 mm.  Her  gallbladder was partially contracted and she had mild thickening of the  wall.  Surgery was consulted and did not feel that surgical intervention  was necessary. On February 26 chest x-ray showed increased infiltrate at  the right lung base and infiltrate involving the right middle lobe.  She  had a CT angiogram of the chest due to her right-sided chest pain and  the CT scan showed no evidence of pulmonary embolus.  She had changes of  early emphysema and airspace disease in the right middle and lower lobe  consistent with pneumonia.  She had a mild degree of airspace opacity in  the left lower lobe as well as peribronchial thickening and mucus  plugging.  She was placed on Levaquin 750 mg daily for her community  acquired pneumonia.  On admission her hemoglobin was 8.9, white count  was 11.7 with an MCV of 100.9 and her liver enzymes were normal except  for an albumin of 3.1.  She had a normal lipase.  TSH and  her ferritin  was 47.  Sputum culture on day two of hospitalization revealed moderate  WBCs, no organisms and moderate Candida albicans.  On March 1 her white  count was noted to be 3.1.  Her MCV was as high as 102.7.  On March 2  she was noted to have an elevated potassium and a total bilirubin of  0.2, alk phos of 120, AST of 46 and ALT of 38 and an albumin of 2.6.  Her lipase was 23.   She reports weighing 96 pounds in Dr. Anthony Sar office on Friday.  She  was measured at 83 pounds in the hospital.  Her body mass index is 14.  Her maximum lifetime weight is 120 pounds.  She currently denies any  fever, chills, nausea or vomiting.  She is having lower abdominal pain.  She had a formed bowel movement yesterday and a formed bowel movement  today.  She has had  no bright red blood per rectum, vomiting blood or  diarrhea.  She has moderate shortness of breath which is better with  breathing treatments.  Her cough is a little more productive.  She has  never had Levaquin in the past.  She is on the depot shot and her last  menstrual period was January 10.  She denies any vaginal bleeding.  She  denies any heartburn, indigestion or problem swallowing.   PAST MEDICAL HISTORY:  1. Bipolar disorder but she is on no medications and receiving no      management because her psychiatrist left.  2. Pneumonia (x3), last episode in 2000.  3. Reported history of colon polyps.   PAST SURGICAL HISTORY:  Left shoulder and left elbow surgery.   ALLERGIES:  No known drug allergies.   MEDICATIONS:  1. Ventolin.  2. Lovenox.  3. Ensure t.i.d.  4. Atrovent.  5. Levaquin 750 mg IV q.24.  6. Nicoderm patch.  7. Protonix 40 mg IV q.24.  8. Senna.   FAMILY HISTORY:  She denies any family history of colon cancer or colon  polyps.   SOCIAL HISTORY:  She is married.  She quit smoking Friday.  She denies  any alcohol use.  She is a stay-at-home mom.  She received blood  transfusions 4 years ago during  childbirth.  She had tattoos placed at  age 89.  She denies snorting cocaine or IV drug use.  She denies sex  with multiple partners.  She says she does not like sex.   REVIEW OF SYSTEMS:  Per the HPI otherwise all systems are negative.   PHYSICAL EXAMINATION:  VITAL SIGNS:  T max 98.7, systolic blood pressure  78-95.GENERAL:  In general she appears ill.  She is alert and  interactive.  HEENT:  Exam is atraumatic, normocephalic.  Pupils equal  and reactive to light.  Her mouth, she has multiple erythematous lesions  in the posterior pharynx.  She does have some white discoloration on her  tongue.  NECK:  Her neck has full range of motion and no  lymphadenopathy.  LUNGS:  Her lungs have decreased breath sounds at the  bases bilaterally.  No end expiratory wheezes.  CARDIOVASCULAR:  Shows  regular rhythm, no murmur.  ABDOMEN:  Bowel sounds are present.  Soft.  Mild tenderness to palpation in the right upper quadrant without rebound  or guarding.  Nontender in the bilateral lower quadrants.  EXTREMITIES:  Have no cyanosis or edema.NEUROLOGICAL:  She has no focal neurologic  deficits.   LABS:  Hemoglobin 9.1-11.7 (no transfusions), heme occult positive  stools x2, potassium 5.3, creatinine 0.63, total bilirubin 0.2, alkaline  phosphatase 120, AST 45, ALT 43, albumin 2.8, lipase 23.   ASSESSMENT:  Ms. Stokely is a 40 year old female who is extremely  underweight.  She does not have any obvious risk factors for HIV.  She  has a sputum culture which had no bacteria but yeast.  It may be that  the findings on the sputum culture were related to prior antibiotic use.  She has a macrocytic anemia but heme positive stools.  The differential  diagnosis includes vitamin B12 deficiency, heme positive stools from  hemorrhoids or gastritis.  She has a low likelihood of having an occult  gastrointestinal malignancy causing her to be underweight.  The  differential diagnosis for her elevated liver  enzymes include medication  side effect (Levaquin), cholestasis associated with infection,  perihepatitis from pneumonia and a low likelihood of  cholecystitis or  micro abscesses in the liver from candidemia.   Thank you for allowing me to see Ms. Agan in consultation.  My  recommendations follow:   RECOMMENDATIONS:  1. Would obtain a CT scan of the abdomen and pelvis with IV and oral      contrast.  2. Continue ensure and increase to q.i.d.  3. Would check a vitamin B12 level as well as a CD-4 count.  Agree      with checking HIV antibody.  4. She should have fungal blood cultures drawn.  5. Will await her acute hepatitis panel.  6. Stop the Levaquin and change to Rocephin and Zithromax.  Agree with      pulmonary consult.  7. Weigh now and then weekly.  8. Colonoscopy plus or minus an EGD as an outpatient.   ADDENDUM 16109:  CD4 Ct 310 (<400, abnl) in presence of multilobar PNA,  HIV neg, B12 nl, Viral and celiac serologies: nl. Fungal Cx pending. OPV  3/25 w/ [facility] and will recheck liver enzymes, HIV viral load and consider  Allergy/Immunology v. ID Consult. Schedule endo after issues with low  CD4 ct resolved.Kassie Mends, M.D.  Electronically Signed     SM/MEDQ  D:  08/22/2008  T:  08/22/2008  Job:  [id number]   cc:   Milus Mallick. Lodema Hong, M.D.  Fax: [phone]

## 2010-11-04 NOTE — Group Therapy Note (Signed)
[name], [name]             ACCOUNT NO.:  [id number]   MEDICAL RECORD NO.:  [id number]          PATIENT TYPE:  INP   LOCATION:  A330                          FACILITY:  [facility]   PHYSICIAN:  Dorris Singh, DO    DATE OF BIRTH:  1970-10-22   DATE OF PROCEDURE:  08/19/2008  DATE OF DISCHARGE:                                 PROGRESS NOTE   The patient is seen today, very uncomfortable, having a lot of  complaints of pain, looks like it is more pleurisy, every time she takes  a deep breath it hurts and any time she moves her right lower chest it  hurts.  She also had an ultrasound scheduled for today which was  completed, which demonstrated contracted gallbladder with associated  wall thickening, no gallstones or pericolic fluid, normal caliber bile  duct, normal sonographic appearance of the liver, pancreas and spleen,  parapelvic cyst noted in the left kidney.  No hydronephrosis.  Bilateral  pleural effusions.   PHYSICAL EXAMINATION:  VITAL SIGNS:  Her vitals are as follows,  temperature 99.1, pulse 73, respirations 18, blood pressure 78/51.  GENERAL:  The patient is a 40 year old Caucasian female who is well-  developed, well-nourished and in no acute distress.  She is thin.  HEART:  Regular rate and rhythm.  LUNGS:  Decreased breath sounds and hard to take a deep breath due to  pain.  ABDOMEN:  Soft, nontender, nondistended.  EXTREMITIES:  Positive pulses.  No edema, ecchymosis, cyanosis.   CURRENT LABS:  White count of 4.4, hemoglobin 9.8, hematocrit 29.1,  platelet count 189 and her BMET is within normal limits.   ASSESSMENT AND PLAN:  1. Chest pain with right lower lobe pneumonia.  The patient is on IV      Levaquin and the patient is still on IV narcotic pain medications.      Will try Tylenol No. 3 to see if that helps due to the side effects      of the pain medicine and see how she does.  2. Hypotension.  The patient states that her blood pressure is  typically low but will continue to monitor her as well.  3. Anemia.  This might be due to dilution.  She seemed to be doing      okay.  4. Weight loss and hypoalbuminemia.  The patient states over the last      couple of weeks she has been eating less.  Has noticed that she      lost 6 pounds over the last 3 weeks but I explained to her to that      if she is not eating a lot      due to stress and then she gets sick that that would probably be      the cause so she currently is drinking a lot of Ensure shakes.  We      will continue to monitor as soon as we get her pleuritic chest pain      under control she will start to feel better so we will try to work  and do that and continue to monitor her with labs.      Dorris Singh, DO  Electronically Signed     CB/MEDQ  D:  08/19/2008  T:  08/19/2008  Job:  [id number]

## 2010-11-04 NOTE — Discharge Summary (Signed)
NAMEJENILLE, Crystal Stein             ACCOUNT NO.:  [id number]   MEDICAL RECORD NO.:  [id number]          PATIENT TYPE:  INP   LOCATION:  A330                          FACILITY:  [facility]   PHYSICIAN:  Dorris Singh, DO    DATE OF BIRTH:  10/28/1970   DATE OF ADMISSION:  08/17/2008  DATE OF DISCHARGE:  03/06/2010LH                               DISCHARGE SUMMARY   PRIMARY CARE PHYSICIAN:  Dr. Lodema Hong.   ADMITTING DIAGNOSES:  1. Refractory community acquired pneumonia.  2. Chest pain as a result of #1.  3. Weight loss probably a result of #1 as well.   DISCHARGE DIAGNOSES:  1. Community acquired pneumonia.  2. Chronic obstructive pulmonary disease.  3. Bipolar disorder.  4. Chest pain.  5. Gastroesophageal reflux disease.  6. Low cortisol levels.  7. Tobacco abuse.  8. Weight loss.   Her H and P was done by Dr. Rito Ehrlich and her hospital course is as  follows.   HOSPITAL COURSE:  The patient was admitted with the above diagnoses in  the service of [facility].  She was started on Levaquin.  She had already  failed outpatient therapy from the emergency department and she was  cultured for Streptococcus and Legionella which were negative.  Her  sputum cultures were also negative and she was started on nebulizer  treatments.  She was found to be hypokalemic which was replaced and also  anemic and it was recommended that she be placed on NuIron as well.  She  had chest pain and upper right abdominal pain.  There was some concern  as to whether or not she had had gallbladder issues or this was the  result of her pneumonia.  On February 26 she had a chest x-ray which  demonstrated increased infiltrate at the right base, suspect right  middle lobe, underlying chronic COPD and asthmatic changes.  She had CT  angiogram on that same day which showed negative for pulmonary embolus,  right lower lobe airspace disease consistent pneumonia.  Recommend a  plain film followup for clearing,  bronchitic changes and mucous plugging  to the left lower lobe.  Due to her abdominal upper abdominal pain she  had an ultrasound of her abdomen which showed a contracted gallbladder  with associated wall thickening.  No gallstones or pericolic fluid.  Normal caliber common bile duct.  Normal sonogram with appearance of  liver, pancreas and spleen, parapelvic cyst associated with left kidney,  no hydronephrosis, bilateral pleural effusions.  She had a two view  chest on March 3 which showed small bilateral pleural effusions,  hyperinflated inflated lungs.  She had a CT of the abdomen with contrast  and increased pleural effusions, right greater than left.  No acute  abdominal process.  Right renal cyst.  She had of her pelvis no acute  pelvic process.  The patient continued to complain about weight loss  that had been going on for the last few months.  There was some concern  as to whether or not she had an adrenal disorder.  She has darker skin  and according to her  her skin tone has not changed but she has always  been one of the darker family members due to she states Native American  heritage but her mom did comment on her skin color so there was some  concern about possible adrenal insufficiency.  So cortisone levels were  checked which were low.  However, we will have her follow up with this  outpatient with her primary care doctor.   She continued on, on February 24 she actually looked more clinically  improved.  Due to her having some blood in her stool we also got GI to  see her and also surgery saw her for the possibility of having  gallbladder disease.  They concluded that they would continue to monitor  her and at this point in time did not feel that her gallbladder needed  to be taken out.  GI has concluded they would like to see her outpatient  for possible colonoscopy in the future once her pneumonia is resolved.  She actually left on the March 5 to go to her grandmother's  funeral who  died while she was in the hospital.  She returned for 1 more day of IV  antibiotics.  Today she looks clinically improved.   PHYSICAL EXAMINATION:  VITAL SIGNS:  Temperature 98.1, pulse 69,  respirations 18, blood pressure 105/66.  GENERAL:  She is well-developed, well-nourished, thin, but in no acute  distress.  HEART:  Regular rate and rhythm.  LUNGS:  Clear to auscultation bilaterally.  ABDOMEN:  Soft and nontender, nondistended.  EXTREMITIES:  Positive pulses.  No edema, ecchymosis or cyanosis.   MEDICATIONS:  1. Her medications she will be sent home on include NuIron 150 mg p.o.      daily.  2. Albuterol 2.5 solution used with nebs q.i.d. #25.  3. Medrol Dosepak use as directed.  4. Levaquin 750 p.o. daily times 7 days.  5. Tessalon Perles 100 mg p.o. t.i.d. with cough.  6. Seroquel 25 mg p.o. b.i.d.  7. Xanax 0.5 mg p.o. t.i.d. #20.  8. Omeprazole 20 mg p.o. daily #30.  9. Vicodin HP one p.o. q.4-6 hours #20.   DISCHARGE INSTRUCTIONS:  1. She is to increase her activity slowly.  2. She is to have no restrictions for eating.  3. She is recommended to stop smoking.   We will give her the number of the following doctors.  Dr. Juanetta Gosling for  followup on pneumonia and COPD.  Dr. Margo Aye.  She has asked for another  name for a primary care doctor and to follow up with endocrinologist to  be evaluated for adrenal insufficiency and also for GI to set up for a  colonoscopy in the future.  She needs to use her medications as  recommended and we also recommend for her to continue with the Ensure  shakes for weight gain.  The patient understands how important it is to  follow up on the adrenal aspect of this and she has agreed to and follow  up with her primary care physician and all her other recommended  physicians.     Dorris Singh, DO  Electronically Signed    CB/MEDQ  D:  08/25/2008  T:  08/25/2008  Job:  [id number]

## 2010-11-21 HISTORY — PX: TUBAL LIGATION: SHX77

## 2010-11-21 HISTORY — PX: DILATION AND CURETTAGE OF UTERUS: SHX78

## 2010-11-27 ENCOUNTER — Other Ambulatory Visit: Payer: Self-pay | Admitting: Obstetrics and Gynecology

## 2010-11-27 ENCOUNTER — Other Ambulatory Visit: Payer: Self-pay | Admitting: Infectious Diseases

## 2010-11-27 ENCOUNTER — Encounter (HOSPITAL_COMMUNITY): Payer: Medicaid Other

## 2010-11-27 LAB — HCG, QUANTITATIVE, PREGNANCY: hCG, Beta Chain, Quant, S: <1 m[IU]/mL (ref <5)

## 2010-11-27 LAB — COMPREHENSIVE METABOLIC PANEL
Albumin: 3.8 g/dL (ref 3.5–5.2)
Alkaline Phosphatase: 91 U/L (ref 39–117)
BUN: 11 mg/dL (ref 6–23)
Calcium: 9.6 mg/dL (ref 8.4–10.5)
Glucose, Bld: 90 mg/dL (ref 70–99)
Potassium: 3.8 mEq/L (ref 3.5–5.1)
Total Protein: 6.9 g/dL (ref 6.0–8.3)

## 2010-11-27 LAB — CBC
HCT: 37.8 % (ref 36.0–46.0)
MCHC: 33.6 g/dL (ref 30.0–36.0)
MCV: 95 fL (ref 78.0–100.0)
Platelets: 276 10*3/uL (ref 150–400)
RDW: 13.4 % (ref 11.5–15.5)
WBC: 10.7 10*3/uL — ABNORMAL HIGH (ref 4.0–10.5)

## 2010-11-27 LAB — SURGICAL PCR SCREEN
MRSA, PCR: NEGATIVE
Staphylococcus aureus: NEGATIVE

## 2010-12-02 ENCOUNTER — Ambulatory Visit (HOSPITAL_COMMUNITY)
Admission: RE | Admit: 2010-12-02 | Discharge: 2010-12-02 | Disposition: A | Discharge status: Home / Self Care | Payer: Medicaid Other | Source: Ambulatory Visit | Attending: Obstetrics and Gynecology | Admitting: Obstetrics and Gynecology

## 2010-12-02 DIAGNOSIS — Z01812 Encounter for preprocedural laboratory examination: Secondary | ICD-10-CM | POA: Insufficient documentation

## 2010-12-02 DIAGNOSIS — J449 Chronic obstructive pulmonary disease, unspecified: Secondary | ICD-10-CM | POA: Insufficient documentation

## 2010-12-02 DIAGNOSIS — Z01818 Encounter for other preprocedural examination: Secondary | ICD-10-CM | POA: Insufficient documentation

## 2010-12-02 DIAGNOSIS — Z302 Encounter for sterilization: Secondary | ICD-10-CM | POA: Insufficient documentation

## 2010-12-02 DIAGNOSIS — J4489 Other specified chronic obstructive pulmonary disease: Secondary | ICD-10-CM | POA: Insufficient documentation

## 2010-12-05 NOTE — H&P (Addendum)
  NAMEMarland Kitchen  ZANOVIA, ROTZ NO.:  [id number]  MEDICAL RECORD NO.:  [id number]  LOCATION:                                 FACILITY:  PHYSICIAN:  Tilda Burrow, M.D. DATE OF BIRTH:  [birth year]  DATE OF ADMISSION: DATE OF DISCHARGE:  LH                             HISTORY & PHYSICAL   December 02, 2010 at 7:30 a.m. at [facility].  ADMISSION DIAGNOSIS:  Menorrhagia, desire for permanent sterilization.  HISTORY OF PRESENT ILLNESS:  This is a 40 year old female, gravida 3, para 3, status post 8 years of continuous Depo-Provera utilization, is admitted for hysteroscopy, D and C, endometrial ablation, to be combined with laparoscopic tubal sterilization and Falope rings to address her eversion to her heavy periods and to eliminate the use of long-term Depo- Provera suppression.  Permanency of the tubal sterilization is explained, reviewed, and meets the patient's desires.  Technical success rate for endometrial ablation is quoted at 90%.  Ultrasound is performed in the office Nov 20, 2010, which shows an anteflexed uterus 6.3 cm length, 1.8 cm AP depth, 4.6 cm transverse, with a 2-mm endometrial stripe with no suspicion of polyps or other abnormalities.  The left ovary is 2.8 x 1.4 x 1.2 cm and right ovary is tiny and not distinctly identifiable.  There is no cul-de-sac fluid or other gross abnormalities.  PAST MEDICAL HISTORY:  Notable for panic attacks and carpal tunnel syndrome.  She has had a history of condyloma.  SURGICAL HISTORY:  Positive for left elbow surgery x2, old shoulder surgery replacement, Dr. Romeo Apple.  The patient has restricted movement of the left shoulder status post surgery.  PHYSICAL EXAMINATION:  GENERAL:  She is a healthy, alert, oriented Caucasian female, weight 125, height 5 feet 5 inches, blood pressure 110/60. HEENT:  Pupils are equal, round, and reactive.  Extraocular movements intact. NECK:   Supple. CARDIOVASCULAR:  Unremarkable. ABDOMEN:  Nontender without masses or hernias. EXTERNAL GENITALIA:  Multiparous female with relaxed introitus, status post deliveries vaginal.  Pelvic support is considered adequate.  The patient denies any rectocele complaints.  Uterus is anteflexed, tiny, normal as previously described.  See HPI. EXTREMITIES:  Normal.  IMPRESSION:  Menorrhagia, desire permanent sterilization.  PLAN:  Laparoscopic tubal sterilization and Falope rings followed by hysteroscopy, D and C, endometrial ablation at 7:30 a.m. on December 02, 2010.     Tilda Burrow, M.D.     JVF/MEDQ  D:  11/20/2010  T:  11/21/2010  Job:  [id number]  cc:   [facility]  Electronically Signed by Christin Bach M.D. on 12/05/2010 07:36:31 PM

## 2010-12-05 NOTE — Op Note (Signed)
Crystal Stein, Crystal Stein             ACCOUNT NO.:  [id number]  MEDICAL RECORD NO.:  [id number]  LOCATION:  DAYP                          FACILITY:  [facility]  PHYSICIAN:  Tilda Burrow, M.D. DATE OF BIRTH:  10-11-1970  DATE OF PROCEDURE:  12/02/2010 DATE OF DISCHARGE:                              OPERATIVE REPORT   PREOPERATIVE DIAGNOSES:  Desire for elective sterilization, menometrorrhagia.  POSTOPERATIVE DIAGNOSES:  Desire for elective sterilization, menometrorrhagia.  PROCEDURE:  Laparoscopic tubal sterilization Falope rings hysteroscopy, discontinued with discontinuation of plans for endometrial ablation.  SURGEON:  Tilda Burrow, MD  ASSISTANT:  None.  ANESTHESIA:  General.  COMPLICATIONS:  Uterine perforation with uterine sound during preparation for endometrial ablation proceeding with ablation at this time.  INDICATIONS:  A 40 year old female with long-term Depo-Provera use due to avoidance of unacceptably heavy periods who was scheduled for both endometrial was scheduled for both endometrial ablation to address her periods and tubal sterilization to allow her to get off all hormone therapies.  DETAILS OF THE PROCEDURE:  The patient was taken to the operating room, prepped and draped for combined abdominal and vaginal procedure.  A Hulka uterine manipulator was placed on the uterus and attention directed to the abdomen where an infraumbilical 1-cm incision was made as well as a transverse suprapubic 8-mm incision.  Attention was directed to the umbilicus.  The Veress needle was used to achieve pneumoperitoneum with water droplet testing performed prior to insufflation and 3 liters of CO2 was insufflated and the 5-mm umbilical trocar inserted without difficulty.  Inspection of the abdomen showed no evidence of trauma or bleeding.  A suprapubic trocar was placed under direct visualization and attention directed to the tubes.  With uterine Hulka manipulator, we  were able to elevate the uterus anteriorly and grasped the left fallopian tube apply the Falope ring and confirm its proper location and then infiltrated in the incarcerated knuckle of tube with Marcaine solution as well as in the mesosalpinx below the Falope ring.  Attention was then directed to the other side where similar Falope ring technique was performed with the uterus deviated to the patient's left by the Hulka manipulator.  Deflation of the abdomen followed and removal of laparoscopic instruments and then subcuticular closure of the skin incisions were performed.  At no time during the manipulation did we appreciate a perforation of the uterine cavity though the uterus was noted to be quite small and the fundus the when manipulated could show that the Hulka manipulator was visible by its upper tip as a bump on the uterus itself.  Again, there was no perforation.  Attention was then directed to the vaginal portion of the case.  The cervix was grasped with single-tooth tenaculum.  Uterus sounded to 6.5 cm, small and was then dilated to with 11, 13, 15, 17, and 19 mm dilators.  Upon passage of a 19 mm dilator of the anticipated resistance of the uterine fundus was not encountered and then dilator extended a couple of centimeters longer than I expected.  The uterine perforation was suspected.  No curettage was performed.  The hysteroscope was carefully introduced during the photo document that there was a cornual perforation  just cephalad to the left tubal egress site on the patient's left cornual area of the uterus.  This was visualized under low pressure and showed no evidence of bleeding.  Nonetheless, it was felt that proceeding with an endometrial ablation was contraindicated.  Inspection of the uterine endometrial cavity showed very thin atrophic endometrium throughout.  Removal of the equipment was then followed by allowing the patient to go to recovery room in good  condition without discomfort.  Sponge and needle counts were correct.  ADDENDUM:  Procedure, complications, and anticipated future plans as well as plans for monitoring of this evening at home were discussed with family.     Tilda Burrow, M.D.     JVF/MEDQ  D:  12/02/2010  T:  12/03/2010  Job:  [id number]  Electronically Signed by Christin Bach M.D. on 12/05/2010 07:35:46 PM

## 2011-01-19 ENCOUNTER — Other Ambulatory Visit: Payer: Self-pay | Admitting: Obstetrics and Gynecology

## 2011-01-20 ENCOUNTER — Encounter (HOSPITAL_COMMUNITY): Payer: Self-pay

## 2011-01-20 NOTE — Patient Instructions (Addendum)
20 Crystal Stein  01/20/2011   Your procedure is scheduled on:  Tuesday, 01/27/11  Report to [facility] at 10:00 AM.  Call this number if you have problems the morning of surgery: [phone]   Remember:   Do not eat food:After Midnight.  Do not drink clear liquids: After Midnight.  Take these medicines the morning of surgery with A SIP OF WATER: xanax, paxil, cogentin, remeron, prilosec, geodon   Do not wear jewelry, make-up or nail polish.  Do not wear lotions, powders, or perfumes. You may wear deodorant.  Do not shave 48 hours prior to surgery.  Do not bring valuables to the hospital.  Contacts, dentures or bridgework may not be worn into surgery.  Leave suitcase in the car. After surgery it may be brought to your room.  For patients admitted to the hospital, checkout time is 11:00 AM the day of discharge.   Patients discharged the day of surgery will not be allowed to drive home.  Name and phone number of your driver: driver  Special Instructions: CHG Shower Use Special Wash: 1/2 bottle night before surgery and 1/2 bottle morning of surgery.   Please read over the following fact sheets that you were given: Pain Booklet, MRSA Information, Surgical Site Infection Prevention, Anesthesia Post-op Instructions and Care and Recovery After Surgery   General Instructions for Surgery These instructions are generic. They cover multiple surgeries and are a general pre-surgical guideline. They do not apply to all procedures. You may have questions. Answers will be provided by your caregiver. PREPARING FOR SURGERY  Stop smoking at least two weeks prior to surgery. This lowers risk during surgery. Ask your caregiver for help with this if needed. The benefits are well worth it. This is a good time for a healthy lifestyle change.   Your caregiver may advise that you stop taking certain medications that may affect the outcome of the surgery and your ability to heal. For example, you may need to  stop taking anti-inflammatories, such as aspirin or ibuprofen because of possible bleeding problems. Other medications may have interactions with anesthesia.   BE SURE TO LET YOUR CAREGIVER KNOW IF YOU HAVE BEEN ON STEROIDS FOR LONG PERIODS OF TIME. THIS IS CRITICAL.   Your caregiver will discuss possible risks and complications with you before surgery. In addition to the usual risks of anesthesia, other common risks and complications include blood loss and replacement (not applicable to minor surgical procedures), temporary increase in pain due to surgery, uncorrected pain or problems the surgery was meant to correct, infection, or new damage.  BEFORE SURGERY Arrive @  prior to your procedure or as your caregiver suggests. Check in at the admissions desk to fill out necessary forms if you are not pre-registered. There will be consent forms to sign prior to the procedure. There is a waiting area for your family while you are having your procedure.  LET YOUR CAREGIVERS KNOW ABOUT THE FOLLOWING:  Allergies.  Medications taken including herbs, eye drops, over the counter medications, and creams.   Use of steroids (by mouth or creams).   Previous problems with anesthetics or novocaine.   Possibility of pregnancy, if this applies.  History of blood clots (thrombophlebitis).   History of bleeding or blood problems.   Previous surgery.   Other health problems.   FOLLOWING SURGERY You will be taken to the recovery area where a nurse will watch and check your progress. Once you are awake, stable, and taking fluids well,  barring other problems you will be allowed to go home. Once home, an ice pack wrapped in a light towel applied to your operative site may help with discomfort and keep the swelling down. Follow instructions as suggested by your caregiver. HOME CARE INSTRUCTIONS  Follow your caregiver's instructions as to activities, exercises, physical therapy, or driving a car.   Weight  reduction may be helpful depending upon the type of surgery.   Daily exercise is helpful. Maintain strength and range of motion as instructed.   Only take over-the-counter or prescription medicines for pain, discomfort, or fever as directed by your caregiver.  SEEK MEDICAL CARE IF:  You notice increased bleeding (more than a small spot) from the wound.   You soak more than four pads following a uterine procedure unless instructed otherwise.   There is redness, swelling, or increasing pain in the wound.   You notice pus coming from wound.   An unexplained oral temperature over 101 develops, or as your caregiver suggests.   You notice a foul smell coming from the wound or dressing.   You become light headed or if you pass out (faint).  SEEK IMMEDIATE MEDICAL CARE IF:  You develop a rash.   You have difficulty breathing.   You develop any allergic problems.  Document Released: 09/14/2000 Document Re-Released: 09/04/2008 [facility] Patient Information 2011 [place], [place].

## 2011-01-21 ENCOUNTER — Encounter (HOSPITAL_COMMUNITY): Payer: Self-pay

## 2011-01-21 ENCOUNTER — Encounter (HOSPITAL_COMMUNITY)
Admission: RE | Admit: 2011-01-21 | Discharge: 2011-01-21 | Disposition: A | Discharge status: Home / Self Care | Payer: Medicaid Other | Source: Ambulatory Visit | Attending: Obstetrics and Gynecology | Admitting: Obstetrics and Gynecology

## 2011-01-21 HISTORY — DX: Encounter for other specified aftercare: Z51.89

## 2011-01-21 HISTORY — DX: Gastro-esophageal reflux disease without esophagitis: K21.9

## 2011-01-21 HISTORY — DX: Anxiety disorder, unspecified: F41.9

## 2011-01-21 HISTORY — DX: Bipolar disorder, unspecified: F31.9

## 2011-01-21 HISTORY — DX: Major depressive disorder, single episode, unspecified: F32.9

## 2011-01-21 HISTORY — DX: Partial loss of teeth, unspecified cause, unspecified class: K08.409

## 2011-01-21 HISTORY — DX: Chronic obstructive pulmonary disease, unspecified: J44.9

## 2011-01-21 HISTORY — DX: Depression, unspecified: F32.A

## 2011-01-21 LAB — SURGICAL PCR SCREEN
MRSA, PCR: NEGATIVE
Staphylococcus aureus: POSITIVE — AB

## 2011-01-21 LAB — BASIC METABOLIC PANEL
BUN: 8 mg/dL (ref 6–23)
Chloride: 103 mEq/L (ref 96–112)
GFR calc Af Amer: >60 mL/min (ref >60)
GFR calc non Af Amer: >60 mL/min (ref >60)
Potassium: 4.6 mEq/L (ref 3.5–5.1)
Sodium: 141 mEq/L (ref 135–145)

## 2011-01-21 LAB — CBC
MCHC: 33.7 g/dL (ref 30.0–36.0)
Platelets: 337 10*3/uL (ref 150–400)
RDW: 13.7 % (ref 11.5–15.5)
WBC: 13.9 10*3/uL — ABNORMAL HIGH (ref 4.0–10.5)

## 2011-01-21 LAB — HCG, SERUM, QUALITATIVE: Preg, Serum: NEGATIVE

## 2011-01-21 MED ORDER — LACTATED RINGERS IV SOLN: INTRAVENOUS | Status: DC

## 2011-01-27 ENCOUNTER — Encounter (HOSPITAL_COMMUNITY)
Admission: RE | Disposition: A | Discharge status: Home / Self Care | Payer: Self-pay | Source: Ambulatory Visit | Attending: Obstetrics and Gynecology

## 2011-01-27 ENCOUNTER — Encounter (HOSPITAL_COMMUNITY): Payer: Self-pay

## 2011-01-27 ENCOUNTER — Other Ambulatory Visit: Payer: Self-pay | Admitting: Obstetrics and Gynecology

## 2011-01-27 ENCOUNTER — Encounter (HOSPITAL_COMMUNITY): Payer: Self-pay | Admitting: Anesthesiology

## 2011-01-27 ENCOUNTER — Ambulatory Visit (HOSPITAL_COMMUNITY): Payer: Medicaid Other | Admitting: Anesthesiology

## 2011-01-27 ENCOUNTER — Ambulatory Visit (HOSPITAL_COMMUNITY)
Admission: RE | Admit: 2011-01-27 | Discharge: 2011-01-27 | Disposition: A | Discharge status: Home / Self Care | Payer: Medicaid Other | Source: Ambulatory Visit | Attending: Obstetrics and Gynecology | Admitting: Obstetrics and Gynecology

## 2011-01-27 ENCOUNTER — Encounter (HOSPITAL_COMMUNITY): Payer: Self-pay | Admitting: Obstetrics and Gynecology

## 2011-01-27 ENCOUNTER — Other Ambulatory Visit (HOSPITAL_COMMUNITY)
Admission: RE | Admit: 2011-01-27 | Discharge: 2011-01-27 | Disposition: A | Discharge status: Home / Self Care | Payer: Medicaid Other | Source: Ambulatory Visit | Attending: Obstetrics and Gynecology | Admitting: Obstetrics and Gynecology

## 2011-01-27 DIAGNOSIS — Z01419 Encounter for gynecological examination (general) (routine) without abnormal findings: Secondary | ICD-10-CM | POA: Insufficient documentation

## 2011-01-27 DIAGNOSIS — Z8742 Personal history of other diseases of the female genital tract: Secondary | ICD-10-CM

## 2011-01-27 DIAGNOSIS — J449 Chronic obstructive pulmonary disease, unspecified: Secondary | ICD-10-CM | POA: Insufficient documentation

## 2011-01-27 DIAGNOSIS — Z79899 Other long term (current) drug therapy: Secondary | ICD-10-CM | POA: Insufficient documentation

## 2011-01-27 DIAGNOSIS — J4489 Other specified chronic obstructive pulmonary disease: Secondary | ICD-10-CM | POA: Insufficient documentation

## 2011-01-27 DIAGNOSIS — N920 Excessive and frequent menstruation with regular cycle: Secondary | ICD-10-CM | POA: Insufficient documentation

## 2011-01-27 DIAGNOSIS — Z01812 Encounter for preprocedural laboratory examination: Secondary | ICD-10-CM | POA: Insufficient documentation

## 2011-01-27 HISTORY — PX: HYSTEROSCOPY: SHX211

## 2011-01-27 SURGERY — HYSTEROSCOPY
Anesthesia: General | Wound class: Clean Contaminated

## 2011-01-27 MED ORDER — FENTANYL CITRATE 0.05 MG/ML IJ SOLN
INTRAMUSCULAR | Status: AC
  Filled 2011-01-27: qty 2

## 2011-01-27 MED ORDER — SUCCINYLCHOLINE CHLORIDE 20 MG/ML IJ SOLN
INTRAMUSCULAR | Status: AC
  Filled 2011-01-27: qty 1

## 2011-01-27 MED ORDER — ONDANSETRON HCL 4 MG/2ML IJ SOLN
4.0000 mg | Freq: Once | INTRAMUSCULAR | Status: AC
  Administered 2011-01-27: 4 mg via INTRAVENOUS

## 2011-01-27 MED ORDER — CEFAZOLIN SODIUM 1-5 GM-% IV SOLN
INTRAVENOUS | Status: AC
  Filled 2011-01-27: qty 50

## 2011-01-27 MED ORDER — MIDAZOLAM HCL 2 MG/2ML IJ SOLN
INTRAMUSCULAR | Status: AC
  Filled 2011-01-27: qty 2

## 2011-01-27 MED ORDER — LACTATED RINGERS IV SOLN
INTRAVENOUS | Status: DC
  Administered 2011-01-27: 10:00:00 via INTRAVENOUS

## 2011-01-27 MED ORDER — FENTANYL CITRATE 0.05 MG/ML IJ SOLN
INTRAMUSCULAR | Status: DC | PRN
  Administered 2011-01-27 (×2): 50 ug via INTRAVENOUS

## 2011-01-27 MED ORDER — PROPOFOL 10 MG/ML IV EMUL
INTRAVENOUS | Status: DC | PRN
  Administered 2011-01-27: 120 mg via INTRAVENOUS

## 2011-01-27 MED ORDER — GLYCOPYRROLATE 0.2 MG/ML IJ SOLN
INTRAMUSCULAR | Status: DC | PRN
  Administered 2011-01-27: .4 mg via INTRAVENOUS

## 2011-01-27 MED ORDER — BUPIVACAINE-EPINEPHRINE PF 0.5-1:200000 % IJ SOLN
INTRAMUSCULAR | Status: AC
  Filled 2011-01-27: qty 10

## 2011-01-27 MED ORDER — PROPOFOL 10 MG/ML IV EMUL
INTRAVENOUS | Status: AC
  Filled 2011-01-27: qty 20

## 2011-01-27 MED ORDER — NEOSTIGMINE METHYLSULFATE 1 MG/ML IJ SOLN
INTRAMUSCULAR | Status: AC
  Filled 2011-01-27: qty 10

## 2011-01-27 MED ORDER — NEOSTIGMINE METHYLSULFATE 1 MG/ML IJ SOLN
INTRAMUSCULAR | Status: DC | PRN
  Administered 2011-01-27: 3 mg via INTRAMUSCULAR

## 2011-01-27 MED ORDER — CEFAZOLIN SODIUM 1-5 GM-% IV SOLN
INTRAVENOUS | Status: DC | PRN
  Administered 2011-01-27: 1 g via INTRAVENOUS

## 2011-01-27 MED ORDER — ONDANSETRON HCL 4 MG/2ML IJ SOLN: 4.0000 mg | Freq: Once | INTRAMUSCULAR | Status: DC | PRN

## 2011-01-27 MED ORDER — LIDOCAINE HCL (PF) 1 % IJ SOLN
INTRAMUSCULAR | Status: AC
  Filled 2011-01-27: qty 5

## 2011-01-27 MED ORDER — LACTATED RINGERS IV SOLN
INTRAVENOUS | Status: DC | PRN
  Administered 2011-01-27: 10:00:00 via INTRAVENOUS

## 2011-01-27 MED ORDER — LIDOCAINE HCL 1 % IJ SOLN
INTRAMUSCULAR | Status: DC | PRN
  Administered 2011-01-27 (×2): 50 mg via INTRADERMAL

## 2011-01-27 MED ORDER — ONDANSETRON HCL 4 MG/2ML IJ SOLN
INTRAMUSCULAR | Status: AC
  Administered 2011-01-27: 4 mg via INTRAVENOUS
  Filled 2011-01-27: qty 2

## 2011-01-27 MED ORDER — SUCCINYLCHOLINE CHLORIDE 20 MG/ML IJ SOLN
INTRAMUSCULAR | Status: DC | PRN
  Administered 2011-01-27: 100 mg via INTRAVENOUS

## 2011-01-27 MED ORDER — MIDAZOLAM HCL 2 MG/2ML IJ SOLN
1.0000 mg | INTRAMUSCULAR | Status: DC | PRN
  Administered 2011-01-27: 2 mg via INTRAVENOUS

## 2011-01-27 MED ORDER — ROCURONIUM BROMIDE 50 MG/5ML IV SOLN
INTRAVENOUS | Status: AC
  Filled 2011-01-27: qty 1

## 2011-01-27 MED ORDER — BUPIVACAINE-EPINEPHRINE 0.5% -1:200000 IJ SOLN
INTRAMUSCULAR | Status: DC | PRN
  Administered 2011-01-27: 10 mL

## 2011-01-27 MED ORDER — ROCURONIUM BROMIDE 100 MG/10ML IV SOLN
INTRAVENOUS | Status: DC | PRN
  Administered 2011-01-27: 20 mg via INTRAVENOUS

## 2011-01-27 MED ORDER — CEFAZOLIN SODIUM 1-5 GM-% IV SOLN: 1.0000 g | INTRAVENOUS | Status: DC

## 2011-01-27 MED ORDER — GLYCOPYRROLATE 0.2 MG/ML IJ SOLN
INTRAMUSCULAR | Status: AC
  Filled 2011-01-27: qty 1

## 2011-01-27 MED ORDER — FENTANYL CITRATE 0.05 MG/ML IJ SOLN: 25.0000 ug | INTRAMUSCULAR | Status: DC | PRN

## 2011-01-27 SURGICAL SUPPLY — 27 items
BAG DECANTER FOR FLEXI CONT (MISCELLANEOUS) ×1 IMPLANT
BAG HAMPER (MISCELLANEOUS) ×2 IMPLANT
CATH ROBINSON RED A/P 16FR (CATHETERS) ×1 IMPLANT
CATH THERMACHOICE III (CATHETERS) IMPLANT
CLOTH BEACON ORANGE TIMEOUT ST (SAFETY) ×2 IMPLANT
COVER LIGHT HANDLE STERIS (MISCELLANEOUS) ×4 IMPLANT
FORMALIN 10 PREFIL 480ML (MISCELLANEOUS) IMPLANT
GLOVE BIOGEL PI IND STRL 7.5 (GLOVE) IMPLANT
GLOVE BIOGEL PI INDICATOR 7.5 (GLOVE) ×1
GLOVE ECLIPSE 7.0 STRL STRAW (GLOVE) ×1 IMPLANT
GLOVE ECLIPSE 7.5 STRL STRAW (GLOVE) ×1 IMPLANT
GLOVE ECLIPSE 9.0 STRL (GLOVE) ×2 IMPLANT
GLOVE INDICATOR STER SZ 9 (GLOVE) ×2 IMPLANT
GOWN BRE IMP SLV AUR XL STRL (GOWN DISPOSABLE) ×2 IMPLANT
GOWN STRL REIN 3XL LVL4 (GOWN DISPOSABLE) ×2 IMPLANT
INST SET HYSTEROSCOPY (KITS) ×2 IMPLANT
IV D5W 500ML (IV SOLUTION) ×1 IMPLANT
IV NS IRRIG 3000ML ARTHROMATIC (IV SOLUTION) ×2 IMPLANT
KIT ROOM TURNOVER AP CYSTO (KITS) ×2 IMPLANT
MANIFOLD NEPTUNE II (INSTRUMENTS) ×2 IMPLANT
MANIFOLD NEPTUNE WASTE (CANNULA) ×2 IMPLANT
NS IRRIG 1000ML POUR BTL (IV SOLUTION) ×2 IMPLANT
PACK PERI GYN (CUSTOM PROCEDURE TRAY) ×2 IMPLANT
PAD ARMBOARD 7.5X6 YLW CONV (MISCELLANEOUS) ×2 IMPLANT
PAD TELFA 3X4 1S STER (GAUZE/BANDAGES/DRESSINGS) ×2 IMPLANT
SET BASIN LINEN APH (SET/KITS/TRAYS/PACK) ×2 IMPLANT
SET IRRIG Y TYPE TUR BLADDER L (SET/KITS/TRAYS/PACK) ×2 IMPLANT

## 2011-01-27 NOTE — H&P (Signed)
Crystal Stein is an 40 y.o. female. Admitted for hysteroscopy, Dilation and Currettage, Endometrial ablation.   She has been rescheduled after June 12 combined tubal ligation and Hysteroscopy, Dilation and CUrrettage, Endometrial Ablation was complicated by uterine fundus perforation by uterine manipulator during Tubal ligation portion of procedure, making it necessary to reschedule endometrial ablation to this later date. Patient has had preoperative placement of a Cervical Laminaria in the office this morning to assist and ease dilation of the cervix.   Patient is aware that ablation will be completed only if the uterine perforation site shows full healing at time of hysteroscopy.  Risks of procedure such as uterine perforation, inability to complete procedure, injury to adjacent organs have been discussed with patient. Pertinent Gynecological History: Menses: recent menses suppressed with long-term use of Depo-Provera. Ablation is planned to allow patient to eliminate long term use of Depo-Provera. Bleeding: none Contraception: tubal ligation DES exposure: denies Blood transfusions: none Sexually transmitted diseases: no past history Previous GYN Procedures: tubal ligation,12/02/2010  Last mammogram: n/a Date:  Last pap: normal Date: 03/2009 OB History: G3, P3   Menstrual History: Menarche age:  No LMP recorded.    Past Medical History  Diagnosis Date  . Anxiety   . Depression   . Asthma   . 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 COPD (chronic obstructive pulmonary disease)   . Blood transfusion   . GERD (gastroesophageal reflux disease)   . Bipolar disorder   . Bipolar disorder   . Bipolar disorder     Past Surgical History  Procedure Date  . Tennis elbow release 04/27/06    left; Crystal Stein, [facility]  . Multiple tooth extractions 10/07    full upper  . Orif proximal humerus fracture 08/06    left,  . Dilation and curettage of uterus June 2012    failed  ablation  . Tubal ligation June 2012    Crystal Stein  . Bipolar disorder   Carpal Tunnel syndrome, s/p shoulder surgery-Crystal [facility] at [facility]  Family History  Problem Relation Age of Onset  . Anesthesia problems Neg Hx   . Hypotension Neg Hx   . Malignant hyperthermia Neg Hx   . Pseudochol deficiency Neg Hx     Social History:  reports that she has been smoking Cigarettes.  She has been smoking about 1 pack per day. She does not have any smokeless tobacco history on file. She reports that she does not drink alcohol or use illicit drugs.  Allergies: No Known Allergies  No prescriptions prior to admission    ROSReview of Systems - Negative except for hx of panic attacks,   There were no vitals taken for this visit. Physical ExamPhysical Examination: General appearance - alert, well appearing, and in no distress, oriented to person, place, and time, normal appearing weight and mental status Heart - normal rate and regular rhythm Abdomen - soft, nontender, nondistended, no masses or organomegaly Well healed umbilical scar Pelvic - normal external genitalia, vulva, vagina, cervix, uterus and adnexa, CERVIX: normal appearing cervix without discharge or lesions, no discharge noted, laminaria placed this am in office, Uterus:  Small, anteflexed nontender,  Adnexa:  No masses or tenderness Extremities - peripheral pulses normal, no pedal edema, no clubbing or cyanosis Skin - normal coloration and turgor, no rashes, no suspicious skin lesions noted    Assess:  Menorrhagia, long term depo-Provera Use, status post discontinued ablation procedure 12/02/2010  Due to uterine perforation at time of  uterine manipulation during concurrent tubal ligation.  Cervical laminaria in place.   Plan:    Hysteroscopy, dilation and Currettage, Endometrial ablation.  No results found for this or any previous visit (from the past 24 hour(s)).  No results found.  Assessment/Plan: See above  Crystal Stein  V 01/27/2011, 8:01 AM

## 2011-01-27 NOTE — Op Note (Signed)
Crystal Stein, [name]             ACCOUNT NO.:  [id number]  MEDICAL RECORD NO.:  [id number]  LOCATION:  [facility]                          FACILITY:  [facility]  PHYSICIAN:  Tilda Burrow, M.D. DATE OF BIRTH:  [birth year]  DATE OF PROCEDURE:  01/27/2011 DATE OF DISCHARGE:  01/27/2011                              OPERATIVE REPORT   PREOPERATIVE DIAGNOSIS:  Menorrhagia, long-term use of Depo-Provera.  POSTOPERATIVE DIAGNOSIS:  Menorrhagia, long-term use of Depo-Provera.  PROCEDURE:  Hysteroscopy, discontinued without endometrial ablation.  SURGEON:  Tilda Burrow, MD  ASSISTANT:  Marlinda Mike, RN and [place], [place].  ANESTHESIA:  General with intubation, paracervical block, Marcaine 10 mL.  COMPLICATIONS:  Right cornual perforation during cervical dilation, photo documented, resulting in discontinuation of endometrial ablation procedure.  DETAILS OF PROCEDURE:  The patient was taken to the operating room, prepped and draped in usual fashion for vaginal procedure.  Time-out was conducted.  IV Ancef 1 g administered and procedure confirmed by involved parties.  Low lithotomy position, leg support were in place. After placement of a bivalve speculum, the cervix could be identified and grasped with single-tooth tenaculum.  Prior to the onset of the procedure, the laminaria that had been placed in the office earlier this a.m. was easily extracted.  The laminaria was 5 cm in length and had dilated to during its 2 hours of insertion to approximately 15 mm maximum diameter.  Once the cervix was grasped with single-tooth tenaculum, the uterus was gently sounded using a 15-mm uterine sound to a depth of 6 cm.  Since the previous perforation had been in the left underlying toward left cornual area, care was taken to orient the tip of the sound to the patient's right side.  The depth of 6 cm was confirmed and then dilation of the cervix easily and slowly and carefully performed with both the  17 and 19-mm dilator.  There was minimal lateral resistance encountered and the anticipated resistance of the uterine fundus was encountered and the upward motion immediately stopped.  We then proceeded with the 21-mm dilator and upon reaching 6 cm in depth again orienting the tip slightly toward the right cornual area, there was virtually no resistance met at 6 cm past quite significantly greater depth.  Uterine perforation was suspected once again.  Hysteroscopy was then performed to assess the endometrial cavity. Hysteroscopy revealed a smooth endometrial cavity consistent with her chronic suppression with Depo-Provera and showed the right tubal ostia area to be dilated with evidence of a perforation.  Photo documentation was done of this right cornual area.  The left cornual area was also photo documented.  The previous perforation during the prior case, which had occurred at the left cornual area was healed and there was no opening other than the tubal ostia itself, which was quite small.  The hysteroscopy pressure was let off and the uterine fundus inspected, and there was no bleeding coming from the right cornual area.  Photo #3 documented the absence of oozing or bleeding from the right cornual area.  Photo 4 once again confirms the defect in the right cornual area. Given the very thin-appearing thickness to the uterine fundus in this area, it was  felt that once again endometrial ablation could not be considered.  Consideration was given to perform the procedure in the laparoscopic guidance, but concern over potential for thermal damage occurring deeper than was desired into the tissues possibly affecting the surface of the uterus was considered too high to proceed with ablation.  Decision was made to abandon the plans to proceed with endometrial ablation.  Photo 5 shows the uterine cavity once again in a smooth form. There was no bleeding.  Procedure was then discontinued and  the patient inspected and there was no bleeding per the cervix.  The patient will be allowed to go to the recovery room and then go from there home as per previous instructions.  The patient had prior to this procedure stated that if ablation could not be performed at this time that she wished to be considered for hysterectomy.  We will assess this as an outpatient basis once the Depo-Provera has worn out of her system and we assess her menstrual flow in the future.     Tilda Burrow, M.D.     JVF/MEDQ  D:  01/27/2011  T:  01/27/2011  Job:  [id number]  cc:   [facility]

## 2011-01-27 NOTE — Brief Op Note (Signed)
01/27/2011  11:42 AM  PATIENT:  Crystal Stein  40 y.o. female  PRE-OPERATIVE DIAGNOSIS:  menometrorrhagia  POST-OPERATIVE DIAGNOSIS:  menometrorrhagia; procedure complication right cornual perforation of uterus  PROCEDURE:  Procedure(s): HYSTEROSCOPY  SURGEON:  Surgeon(s): Tilda Burrow, MD  PHYSICIAN ASSISTANT:   ASSISTANTS: none   ANESTHESIA:   local and General  ESTIMATED BLOOD LOSS: minimal   BLOOD ADMINISTERED:none  DRAINS: none   LOCAL MEDICATIONS USED:  MARCAINE 10CC  SPECIMEN:  No Specimen  DISPOSITION OF SPECIMEN:  N/A  COUNTS:  YES  TOURNIQUET:  * No tourniquets in log *  DICTATION #: dictattion number lost, will add later  PLAN OF CARE: home as outpatient  PATIENT DISPOSITION:  PACU - hemodynamically stable.   Delay start of Pharmacological VTE agent (>24hrs) due to surgical blood loss or risk of bleeding:  yes

## 2011-01-27 NOTE — Op Note (Addendum)
See dictated operative note 01/27/2011

## 2011-01-27 NOTE — Anesthesia Preprocedure Evaluation (Addendum)
Anesthesia Evaluation  Name, MR# and DOB Patient awake  General Assessment Comment  Reviewed: Allergy & Precautions, H&P , NPO status , Patient's Chart, lab work & pertinent test results  History of Anesthesia Complications Negative for: history of anesthetic complications  Airway Mallampati: I  Neck ROM: Full    Dental  (+) Edentulous Upper and Edentulous Lower   Pulmonary  asthmaCOPD  clear to auscultation  breath sounds clear to auscultation none    Cardiovascular Regular Normal    Neuro/Psych    (+) Anxiety, Depression, Bipolar Disorder,    GI/Hepatic/Renal (+)  GERD Medicated and Controlled     Endo/Other    Abdominal   Musculoskeletal   Hematology   Peds  Reproductive/Obstetrics    Anesthesia Other Findings             Anesthesia Physical Anesthesia Plan  ASA: II  Anesthesia Plan: General   Post-op Pain Management:    Induction: Intravenous, Rapid sequence and Cricoid pressure planned  Airway Management Planned: Oral ETT  Additional Equipment:   Intra-op Plan:   Post-operative Plan:   Informed Consent: I have reviewed the patients History and Physical, chart, labs and discussed the procedure including the risks, benefits and alternatives for the proposed anesthesia with the patient or authorized representative who has indicated his/her understanding and acceptance.     Plan Discussed with: CRNA  Anesthesia Plan Comments:         Anesthesia Quick Evaluation

## 2011-01-27 NOTE — Anesthesia Procedure Notes (Signed)
Procedure Name: Intubation Date/Time: 01/27/2011 10:54 AM Performed by: Despina Hidden Pre-anesthesia Checklist: Patient identified, Patient being monitored, Emergency Drugs available, Timeout performed and Suction available Patient Re-evaluated:Patient Re-evaluated prior to inductionOxygen Delivery Method: Circle System Utilized Preoxygenation: Pre-oxygenation with 100% oxygen Intubation Type: IV induction, Rapid sequence and Circoid Pressure applied Ventilation: Mask ventilation without difficulty Laryngoscope Size: Mac and 3 Grade View: Grade I Tube type: Oral Tube size: 7.0 mm Number of attempts: 1 Airway Equipment and Method: stylet Placement Confirmation: ETT inserted through vocal cords under direct vision,  breath sounds checked- equal and bilateral and positive ETCO2 Secured at: 22 cm Tube secured with: Tape Dental Injury: Teeth and Oropharynx as per pre-operative assessment

## 2011-01-27 NOTE — Transfer of Care (Signed)
Immediate Anesthesia Transfer of Care Note  Patient: Crystal Stein  Procedure(s) Performed:  HYSTEROSCOPY  Patient Location: PACU  Anesthesia Type: General  Level of Consciousness: awake, alert , oriented and patient cooperative  Airway & Oxygen Therapy: Patient Spontanous Breathing and Patient connected to face mask oxygen  Post-op Assessment: Report given to PACU RN, Post -op Vital signs reviewed and stable and Patient moving all extremities  Post vital signs: stable  Complications: No apparent anesthesia complications

## 2011-01-27 NOTE — Brief Op Note (Signed)
Pt states she doesn't have to urinate and doesn't want to stay. Will call or come to ED for difficulty.

## 2011-01-27 NOTE — Anesthesia Postprocedure Evaluation (Signed)
  Anesthesia Post-op Note  Patient: Crystal Stein  Procedure(s) Performed:  HYSTEROSCOPY  Patient Location: PACU  Anesthesia Type: General  Level of Consciousness: awake, alert , oriented and patient cooperative  Airway and Oxygen Therapy: Patient Spontanous Breathing and Patient connected to face mask oxygen  Post-op Pain: none  Post-op Assessment: Post-op Vital signs reviewed, Patient's Cardiovascular Status Stable, Respiratory Function Stable and No signs of Nausea or vomiting  Post-op Vital Signs: stable  Complications: No apparent anesthesia complications

## 2011-01-28 NOTE — Brief Op Note (Signed)
Pt states she is not having trouble urinating. Only having mild abd. Cramping. No vaginal bleeding.

## 2011-02-04 ENCOUNTER — Encounter (HOSPITAL_COMMUNITY): Payer: Self-pay | Admitting: Obstetrics and Gynecology

## 2011-02-06 ENCOUNTER — Other Ambulatory Visit: Payer: Self-pay | Admitting: Obstetrics and Gynecology

## 2011-02-06 DIAGNOSIS — Z79899 Other long term (current) drug therapy: Secondary | ICD-10-CM

## 2011-02-11 ENCOUNTER — Ambulatory Visit (HOSPITAL_COMMUNITY)
Admission: RE | Admit: 2011-02-11 | Discharge: 2011-02-11 | Disposition: A | Discharge status: Home / Self Care | Payer: Medicaid Other | Source: Ambulatory Visit | Attending: Obstetrics and Gynecology | Admitting: Obstetrics and Gynecology

## 2011-02-11 DIAGNOSIS — Z79899 Other long term (current) drug therapy: Secondary | ICD-10-CM | POA: Insufficient documentation

## 2011-02-11 DIAGNOSIS — Z13820 Encounter for screening for osteoporosis: Secondary | ICD-10-CM | POA: Insufficient documentation

## 2011-05-12 ENCOUNTER — Other Ambulatory Visit: Payer: Self-pay | Admitting: Obstetrics and Gynecology

## 2011-05-13 ENCOUNTER — Encounter (HOSPITAL_COMMUNITY): Payer: Self-pay | Admitting: Pharmacy Technician

## 2011-05-13 ENCOUNTER — Encounter (HOSPITAL_COMMUNITY): Payer: Self-pay

## 2011-05-13 ENCOUNTER — Encounter (HOSPITAL_COMMUNITY)
Admission: RE | Admit: 2011-05-13 | Discharge: 2011-05-13 | Disposition: A | Discharge status: Home / Self Care | Payer: Medicaid Other | Source: Ambulatory Visit | Attending: Obstetrics and Gynecology | Admitting: Obstetrics and Gynecology

## 2011-05-13 LAB — URINALYSIS, ROUTINE W REFLEX MICROSCOPIC
Glucose, UA: NEGATIVE mg/dL
Ketones, ur: NEGATIVE mg/dL
pH: 6 (ref 5.0–8.0)

## 2011-05-13 LAB — URINE MICROSCOPIC-ADD ON

## 2011-05-13 LAB — CBC
Hemoglobin: 12.8 g/dL (ref 12.0–15.0)
MCH: 32.1 pg (ref 26.0–34.0)
MCV: 99 fL (ref 78.0–100.0)
RBC: 3.99 MIL/uL (ref 3.87–5.11)

## 2011-05-13 LAB — BASIC METABOLIC PANEL
CO2: 30 mEq/L (ref 19–32)
Chloride: 103 mEq/L (ref 96–112)
Creatinine, Ser: 0.61 mg/dL (ref 0.50–1.10)
Potassium: 3.7 mEq/L (ref 3.5–5.1)

## 2011-05-13 NOTE — Patient Instructions (Addendum)
20 Crystal Stein  05/13/2011   Your procedure is scheduled on:  05/19/2011  Report to [facility] at 06:15 AM.  Call this number if you have problems the morning of surgery: [phone]   Remember:   Do not eat food:After Midnight.  Do not drink clear liquids: After Midnight.  Take these medicines the morning of surgery with A SIP OF WATER: Xanax, Paxil, Lorcet, Prilosec, Geodon, and Remeron.   Do not wear jewelry, make-up or nail polish.  Do not wear lotions, powders, or perfumes. You may wear deodorant.  Do not shave 48 hours prior to surgery.  Do not bring valuables to the hospital.  Contacts, dentures or bridgework may not be worn into surgery.  Leave suitcase in the car. After surgery it may be brought to your room.  For patients admitted to the hospital, checkout time is 11:00 AM the day of discharge.   Patients discharged the day of surgery will not be allowed to drive home.  Name and phone number of your driver:   Special Instructions: CHG Shower Use Special Wash: 1/2 bottle night before surgery and 1/2 bottle morning of surgery.   Please read over the following fact sheets that you were given: Pain Booklet, MRSA Information, Surgical Site Infection Prevention, Anesthesia Post-op Instructions and Care and Recovery After Surgery      Hysterectomy A hysterectomy is a procedure where your womb (uterus) is surgically taken out. It will no longer be possible to have menstrual periods or to become pregnant. Removal of the tubes and ovaries (bilateral salpingo-oopherectomy) can be done during this operation as well.   An abdominal hysterectomy is done through a large cut (incision) in the abdomen made by the surgeon.   A vaginal hysterectomy is done through the vagina. There are no abdominal incisions, but there will be incisions on the inside of the vagina.   A laparoscopic assisted vaginal hysterectomy is done through 2 or 3 small incisions in the abdomen, but the uterus is  removed and passed through the vagina.  Women who are going to have a hysterectomy should be tested first to make sure there is no cancer of the cervix or in the uterus. INDICATIONS FOR HYSTERECTOMY:  Persistent abnormal bleeding.   Lasting (chronic) pelvic pain.   Endometriosis. This is when the lining of the uterus (endometrium) is misplaced outside of its normal location.   Adenomyosis. This is when the endometrium tissue grows in the muscle of the uterus.   Uterine prolapse. This is when the uterus falls down into the vagina.   Cancer of the uterus or cervix that requires a radical hysterectomy, removal of the uterus, tubes, ovaries, and surrounding lymph nodes.  LET YOUR CAREGIVER KNOW ABOUT:  Allergies (especially to medicines).   Medications taken including herbs, eye drops, over the counter medications, and creams.   Use of steroids (by mouth or creams).   Past problems with anesthetics or numbing medication.   Possibility of pregnancy, if this applies.   History of blood clots (thrombophlebitis).   History of bleeding or blood problems.   Past surgery.   Other health problems.  RISKS AND COMPLICATIONS All surgeries can have risks. Some of these risks are:  A lot of bleeding.   Injury to surrounding organs.   Infection.   Blood clots of the leg, heart, or lung.   Problems with anesthesia.   Early menopause.  BEFORE THE PROCEDURE  Do not take aspirin or blood thinners for a week  before surgery, or as directed by your caregiver.   Do not eat or drink anything after midnight the night before surgery, or as directed by your caregiver.   Let your caregiver know if you get a cold or other infectious problems before surgery.   If you are being admitted the day of surgery, you should be present 60 minutes before your procedure or as told by your caregiver.  PROCEDURE   An IV (intravenous) will be placed in your arm. You will be given a drug to make you sleep  (anesthetic) during surgery. You may be given a shot in the spine (spinal anesthesia) that will numb your body from the waist down. This will keep you pain-free during surgery.   When you wake from surgery, you will have the IV and a long, narrow, hollow tube (urinary catheter) draining the bladder for 1 or 2 days after surgery. This will make passing your urine easier. It also helps by keeping your bladder empty during surgery.   After surgery, you will be taken to the recovery area where a nurse will watch and check your progress. Once you wake up, stable and taking fluids well, without other problems, you will be allowed to return to your room. Usually you will remain in the hospital 3 to 5 days. You may be given an antibiotic during and after the surgery and when you go home. Pain medication will be ordered by your caregiver while you are in the hospital and when you go home.  HOME CARE INSTRUCTIONS  Healing will take time. You will have discomfort, tenderness, swelling, and bruising at the operative site for a couple of weeks. This is normal and will get better as time goes on.   Only take over-the-counter or prescription medicines for pain, discomfort, or fever as directed by your caregiver.   Do not take aspirin. It can cause bleeding.   Do not drive when taking pain medication.   Follow your caregiver's advice regarding diet, exercise, lifting, driving, and general activities.   Resume your usual diet as directed and allowed.   Get plenty of rest and sleep.   Do not douche, use tampons, or have sexual intercourse until your caregiver gives you permission.   Change your bandages (dressings) as directed.   Take your temperature twice a day. Write it down.   Your caregiver may recommend showers instead of baths for a few weeks.   Do not drink alcohol until your caregiver gives you permission.   If you develop constipation, you may take a mild laxative with your caregiver's  permission. Bran foods and drinking fluids helps with constipation problems.   Try to have someone home with you for a week or two to help with the household activities.   Make sure you and your family understands everything about your operation and recovery.   Do not sign any legal documents until you feel normal again.   Keep all your follow-up appointments as recommended by your caregiver.  SEEK MEDICAL CARE IF:   There is swelling, redness, or increasing pain in the wound area.   Pus is coming from the wound.   You notice a bad smell from the wound or surgical dressing.   You have pain, redness, and swelling from the intravenous site.   The wound is breaking open (the edges are not staying together).   You feel dizzy or feel like fainting.   You develop pain or bleeding when you urinate.   You  develop diarrhea.   You develop nausea and vomiting.   You develop abnormal vaginal discharge.   You develop a rash.   You have any type of abnormal reaction or develop an allergy to your medication.   You need stronger pain medication for your pain.  SEEK IMMEDIATE MEDICAL CARE IF:  You have a fever.   You develop abdominal pain.   You develop chest pain.   You develop shortness of breath.   You pass out.   You develop pain, swelling, or redness of your leg.   You develop heavy vaginal bleeding with or without blood clots.  Document Released: 12/02/2000 Document Revised: 02/18/2011 Document Reviewed: 10/20/2007 [facility] Patient Information 2012 [place], [place].

## 2011-05-18 NOTE — H&P (Addendum)
Crystal Stein is an 40 y.o. female. She is admitted for vaginal hysterectomy. She is now 3 months status post-year-old endometrial ablation attempted 01/27/2011 when the very small uterus was perforated at the cornual area requiring discontinuation of procedure. The patient has a long history of unacceptably heavy periods which she is managed for years with long-term Depo-Provera utilization. Tubal sterilization has been performed she has recently resumed having periods which she finds unacceptably heavy and uncomfortable. After discussion of treatment options, she has decided to proceed with vaginal hysterectomy. Alternatives are well discussed with the patient and she rejects sure solutions. She is a recent Pap smear normal 01/27/2011  Pertinent Gynecological History: Menses: flow is moderate and Unacceptable to the patient Bleeding: Has resumed since discontinuation of Depo-Provera Contraception: abstinence and tubal ligation DES exposure: denies Blood transfusions: none Sexually transmitted diseases: no past history Previous GYN Procedures: Tubal ligation 12/02/2010 with discontinued endometrial ablation attempt to 2 left cornual uterine perforation  Last mammogram: normal Date:? Last pap: normal Date: 01/27/2011 OB History: G3, P3   Menstrual History: Menarche age: ? No LMP recorded. Patient has had an injection.   LMP: 04/11/2011  Past Medical History  Diagnosis Date  . Anxiety   . Depression   . Asthma   . 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 COPD (chronic obstructive pulmonary disease)   . GERD (gastroesophageal reflux disease)   . Bipolar disorder   . Blood transfusion 2006    Past Surgical History  Procedure Date  . Tennis elbow release 04/27/06    left; Romeo Apple, [facility]  . Multiple tooth extractions 10/07    full upper  . Orif proximal humerus fracture 08/06    left,  . Dilation and curettage of uterus June 2012    failed ablation  . Tubal  ligation June 2012    Crystal Stein  . Bipolar disorder   . Hysteroscopy 01/27/2011    Procedure: HYSTEROSCOPY;  Surgeon: Tilda Burrow, MD;  Location: [facility];  Service: Gynecology;  Laterality: N/A;    Family History  Problem Relation Age of Onset  . Anesthesia problems Neg Hx   . Hypotension Neg Hx   . Malignant hyperthermia Neg Hx   . Pseudochol deficiency Neg Hx     Social History:  reports that she has been smoking Cigarettes.  She has a 22 pack-year smoking history. She does not have any smokeless tobacco history on file. She reports that she does not drink alcohol or use illicit drugs.  Allergies: No Known Allergies  No prescriptions prior to admission    ROS negative as listed above in history of present illness  There were no vitals taken for this visit. Physical ExamPhysical Examination: General appearance - alert, well appearing, and in no distress and oriented to person, place, and time Mental status - alert, oriented to person, place, and time, normal mood, behavior, speech, dress, motor activity, and thought processes Mouth - mucous membranes moist, pharynx normal without lesions and Good oral airway Chest - clear to auscultation, no wheezes, rales or rhonchi, symmetric air entry Heart - normal rate and regular rhythm Abdomen - soft, nontender, nondistended, no masses or organomegaly scars from previous incisions intact Pelvic - VULVA: normal appearing vulva with no masses, tenderness or lesions, VAGINA: normal appearing vagina with normal color and discharge, no lesions, CERVIX: normal appearing cervix without discharge or lesions, UTERUS: uterus is normal size, shape, consistency and nontender, anteverted, ADNEXA: normal adnexa in size, nontender  and no masses, no masses Extremities - peripheral pulses normal, no pedal edema, no clubbing or cyanosis  No results found for this or any previous visit (from the past 24 hour(s)).  No results  found.  Assessment/Plan: Dysmenorrhea, menorrhagia unacceptable to the patient requesting hysterectomy planned as vaginal hysterectomy. Risks of procedure including bleeding infection or need for subsequent surgeries well discussed with patient plans for ovarian preservation are planneed. Plan vaginal hysterectomy [id number] 2012 Crystal Stein 05/18/2011, 9:02 PM I have evaluated Crystal Stein preoperatively, and have identified no interval change in the medical condition or plan of care since the history and physical of record. She insists that if any difficulties are encountered with vaginal approach, that abdominal approach be utilized to complete procedure. Risks of procedure well known to patient from prior outpatient discussions

## 2011-05-19 ENCOUNTER — Other Ambulatory Visit: Payer: Self-pay | Admitting: Obstetrics and Gynecology

## 2011-05-19 ENCOUNTER — Ambulatory Visit (HOSPITAL_COMMUNITY): Payer: Medicaid Other | Admitting: Anesthesiology

## 2011-05-19 ENCOUNTER — Encounter (HOSPITAL_COMMUNITY)
Admission: RE | Disposition: A | Discharge status: Home / Self Care | Payer: Self-pay | Source: Ambulatory Visit | Attending: Obstetrics and Gynecology

## 2011-05-19 ENCOUNTER — Ambulatory Visit (HOSPITAL_COMMUNITY)
Admission: RE | Admit: 2011-05-19 | Discharge: 2011-05-20 | Disposition: A | Discharge status: Home / Self Care | Payer: Medicaid Other | Source: Ambulatory Visit | Attending: Obstetrics and Gynecology | Admitting: Obstetrics and Gynecology

## 2011-05-19 ENCOUNTER — Encounter (HOSPITAL_COMMUNITY): Payer: Self-pay | Admitting: Anesthesiology

## 2011-05-19 ENCOUNTER — Encounter (HOSPITAL_COMMUNITY): Payer: Self-pay | Admitting: *Deleted

## 2011-05-19 DIAGNOSIS — N949 Unspecified condition associated with female genital organs and menstrual cycle: Secondary | ICD-10-CM | POA: Insufficient documentation

## 2011-05-19 DIAGNOSIS — N938 Other specified abnormal uterine and vaginal bleeding: Secondary | ICD-10-CM | POA: Insufficient documentation

## 2011-05-19 DIAGNOSIS — N944 Primary dysmenorrhea: Secondary | ICD-10-CM | POA: Diagnosis present

## 2011-05-19 DIAGNOSIS — N946 Dysmenorrhea, unspecified: Secondary | ICD-10-CM | POA: Insufficient documentation

## 2011-05-19 HISTORY — PX: VAGINAL HYSTERECTOMY: SHX2639

## 2011-05-19 SURGERY — HYSTERECTOMY, VAGINAL
Anesthesia: General | Wound class: Clean Contaminated

## 2011-05-19 MED ORDER — PAROXETINE HCL ER 37.5 MG PO TB24
75.0000 mg | ORAL_TABLET | ORAL | Status: DC
Start: 2011-05-20 — End: 2011-05-20
  Administered 2011-05-20: 75 mg via ORAL
  Filled 2011-05-19: qty 2

## 2011-05-19 MED ORDER — BUPIVACAINE-EPINEPHRINE 0.5% -1:200000 IJ SOLN
INTRAMUSCULAR | Status: DC | PRN
  Administered 2011-05-19: 18 mL
  Administered 2011-05-19: 9 mL

## 2011-05-19 MED ORDER — MIDAZOLAM HCL 5 MG/5ML IJ SOLN
INTRAMUSCULAR | Status: DC | PRN
  Administered 2011-05-19: 2 mg via INTRAVENOUS

## 2011-05-19 MED ORDER — HYDROMORPHONE 0.3 MG/ML IV SOLN
INTRAVENOUS | Status: DC
  Administered 2011-05-19: 6 mg via INTRAVENOUS
  Administered 2011-05-19: 5 mg via INTRAVENOUS
  Administered 2011-05-19: 7.5 mg via INTRAVENOUS
  Administered 2011-05-20: 0.3 mg via INTRAVENOUS
  Administered 2011-05-20: 2 mg via INTRAVENOUS

## 2011-05-19 MED ORDER — LIDOCAINE HCL 1 % IJ SOLN
INTRAMUSCULAR | Status: DC | PRN
  Administered 2011-05-19: 25 mg via INTRADERMAL

## 2011-05-19 MED ORDER — MIRTAZAPINE 30 MG PO TABS
30.0000 mg | ORAL_TABLET | Freq: Every day | ORAL | Status: DC
  Administered 2011-05-19: 30 mg via ORAL
  Filled 2011-05-19: qty 1

## 2011-05-19 MED ORDER — BIOTENE DRY MOUTH MT LIQD
15.0000 mL | Freq: Two times a day (BID) | OROMUCOSAL | Status: DC
  Administered 2011-05-19 – 2011-05-20 (×2): 15 mL via OROMUCOSAL

## 2011-05-19 MED ORDER — HYDROMORPHONE 0.3 MG/ML IV SOLN
INTRAVENOUS | Status: AC
  Filled 2011-05-19: qty 25

## 2011-05-19 MED ORDER — PANTOPRAZOLE SODIUM 40 MG IV SOLR
40.0000 mg | Freq: Every day | INTRAVENOUS | Status: DC
  Administered 2011-05-19: 40 mg via INTRAVENOUS
  Filled 2011-05-19: qty 40

## 2011-05-19 MED ORDER — LIDOCAINE HCL (PF) 1 % IJ SOLN
INTRAMUSCULAR | Status: AC
  Filled 2011-05-19: qty 5

## 2011-05-19 MED ORDER — FENTANYL CITRATE 0.05 MG/ML IJ SOLN
25.0000 ug | INTRAMUSCULAR | Status: DC | PRN
  Administered 2011-05-19 (×3): 25 ug via INTRAVENOUS

## 2011-05-19 MED ORDER — ONDANSETRON HCL 4 MG/2ML IJ SOLN: 4.0000 mg | Freq: Once | INTRAMUSCULAR | Status: DC | PRN

## 2011-05-19 MED ORDER — FENTANYL CITRATE 0.05 MG/ML IJ SOLN
INTRAMUSCULAR | Status: DC | PRN
  Administered 2011-05-19 (×3): 50 ug via INTRAVENOUS

## 2011-05-19 MED ORDER — BUPIVACAINE-EPINEPHRINE PF 0.5-1:200000 % IJ SOLN
INTRAMUSCULAR | Status: AC
  Filled 2011-05-19: qty 10

## 2011-05-19 MED ORDER — LORAZEPAM 2 MG/ML IJ SOLN: 0.5000 mg | Freq: Once | INTRAMUSCULAR | Status: DC

## 2011-05-19 MED ORDER — GLYCOPYRROLATE 0.2 MG/ML IJ SOLN
0.2000 mg | Freq: Once | INTRAMUSCULAR | Status: AC | PRN
  Administered 2011-05-19: 0.2 mg via INTRAVENOUS

## 2011-05-19 MED ORDER — KCL IN DEXTROSE-NACL 20-5-0.45 MEQ/L-%-% IV SOLN
INTRAVENOUS | Status: DC
  Administered 2011-05-19 (×2): via INTRAVENOUS
  Administered 2011-05-20: 1000 mL via INTRAVENOUS

## 2011-05-19 MED ORDER — MIDAZOLAM HCL 2 MG/2ML IJ SOLN
INTRAMUSCULAR | Status: AC
  Administered 2011-05-19: 2 mg via INTRAVENOUS
  Filled 2011-05-19: qty 2

## 2011-05-19 MED ORDER — ALPRAZOLAM 1 MG PO TABS: 1.0000 mg | ORAL_TABLET | Freq: Four times a day (QID) | ORAL | Status: DC | PRN

## 2011-05-19 MED ORDER — ZOLPIDEM TARTRATE 5 MG PO TABS: 5.0000 mg | ORAL_TABLET | Freq: Every evening | ORAL | Status: DC | PRN

## 2011-05-19 MED ORDER — SODIUM CHLORIDE 0.9 % IJ SOLN: 9.0000 mL | INTRAMUSCULAR | Status: DC | PRN

## 2011-05-19 MED ORDER — FENTANYL CITRATE 0.05 MG/ML IJ SOLN
INTRAMUSCULAR | Status: AC
  Administered 2011-05-19: 25 ug via INTRAVENOUS
  Filled 2011-05-19: qty 2

## 2011-05-19 MED ORDER — KETOROLAC TROMETHAMINE 30 MG/ML IJ SOLN: 30.0000 mg | Freq: Four times a day (QID) | INTRAMUSCULAR | Status: DC

## 2011-05-19 MED ORDER — KETOROLAC TROMETHAMINE 30 MG/ML IJ SOLN
30.0000 mg | Freq: Four times a day (QID) | INTRAMUSCULAR | Status: DC
  Administered 2011-05-19 – 2011-05-20 (×2): 30 mg via INTRAVENOUS
  Filled 2011-05-19 (×3): qty 1

## 2011-05-19 MED ORDER — MIDAZOLAM HCL 2 MG/2ML IJ SOLN
1.0000 mg | INTRAMUSCULAR | Status: DC | PRN
  Administered 2011-05-19: 2 mg via INTRAVENOUS

## 2011-05-19 MED ORDER — OXYCODONE-ACETAMINOPHEN 5-325 MG PO TABS: 1.0000 | ORAL_TABLET | ORAL | Status: DC | PRN

## 2011-05-19 MED ORDER — GLYCOPYRROLATE 0.2 MG/ML IJ SOLN
INTRAMUSCULAR | Status: AC
  Administered 2011-05-19: 0.2 mg via INTRAVENOUS
  Filled 2011-05-19: qty 1

## 2011-05-19 MED ORDER — LACTATED RINGERS IV SOLN
INTRAVENOUS | Status: DC
  Administered 2011-05-19 (×2): via INTRAVENOUS

## 2011-05-19 MED ORDER — CEFAZOLIN SODIUM 1-5 GM-% IV SOLN: 1.0000 g | INTRAVENOUS | Status: DC

## 2011-05-19 MED ORDER — DIPHENHYDRAMINE HCL 12.5 MG/5ML PO ELIX: 12.5000 mg | ORAL_SOLUTION | Freq: Four times a day (QID) | ORAL | Status: DC | PRN

## 2011-05-19 MED ORDER — ONDANSETRON HCL 4 MG/2ML IJ SOLN: 4.0000 mg | Freq: Four times a day (QID) | INTRAMUSCULAR | Status: DC | PRN

## 2011-05-19 MED ORDER — ROCURONIUM BROMIDE 50 MG/5ML IV SOLN
INTRAVENOUS | Status: AC
  Filled 2011-05-19: qty 1

## 2011-05-19 MED ORDER — CEFAZOLIN SODIUM 1-5 GM-% IV SOLN
INTRAVENOUS | Status: AC
  Filled 2011-05-19: qty 50

## 2011-05-19 MED ORDER — PROMETHAZINE HCL 25 MG/ML IJ SOLN: 12.5000 mg | INTRAMUSCULAR | Status: DC | PRN

## 2011-05-19 MED ORDER — PROPOFOL 10 MG/ML IV EMUL
INTRAVENOUS | Status: DC | PRN
  Administered 2011-05-19: 130 mL via INTRAVENOUS

## 2011-05-19 MED ORDER — MIDAZOLAM HCL 2 MG/2ML IJ SOLN
INTRAMUSCULAR | Status: AC
  Filled 2011-05-19: qty 2

## 2011-05-19 MED ORDER — CEFAZOLIN SODIUM 1-5 GM-% IV SOLN
INTRAVENOUS | Status: DC | PRN
  Administered 2011-05-19: 1 g via INTRAVENOUS

## 2011-05-19 MED ORDER — FENTANYL CITRATE 0.05 MG/ML IJ SOLN
INTRAMUSCULAR | Status: AC
  Administered 2011-05-19: 25 ug via INTRAVENOUS
  Filled 2011-05-19: qty 5

## 2011-05-19 MED ORDER — PROPOFOL 10 MG/ML IV EMUL
INTRAVENOUS | Status: AC
  Filled 2011-05-19: qty 20

## 2011-05-19 MED ORDER — DIPHENHYDRAMINE HCL 50 MG/ML IJ SOLN: 12.5000 mg | Freq: Four times a day (QID) | INTRAMUSCULAR | Status: DC | PRN

## 2011-05-19 MED ORDER — ZIPRASIDONE HCL 40 MG PO CAPS
40.0000 mg | ORAL_CAPSULE | Freq: Two times a day (BID) | ORAL | Status: DC
  Administered 2011-05-19 – 2011-05-20 (×2): 40 mg via ORAL
  Filled 2011-05-19 (×2): qty 1

## 2011-05-19 MED ORDER — NALOXONE HCL 0.4 MG/ML IJ SOLN: 0.4000 mg | INTRAMUSCULAR | Status: DC | PRN

## 2011-05-19 MED ORDER — KETOROLAC TROMETHAMINE 30 MG/ML IJ SOLN: 30.0000 mg | Freq: Once | INTRAMUSCULAR | Status: DC

## 2011-05-19 MED ORDER — ONDANSETRON HCL 4 MG/2ML IJ SOLN
4.0000 mg | Freq: Once | INTRAMUSCULAR | Status: AC
  Administered 2011-05-19: 4 mg via INTRAVENOUS

## 2011-05-19 MED ORDER — ROCURONIUM BROMIDE 100 MG/10ML IV SOLN
INTRAVENOUS | Status: DC | PRN
  Administered 2011-05-19: 35 mg via INTRAVENOUS

## 2011-05-19 MED ORDER — ONDANSETRON HCL 4 MG/2ML IJ SOLN
INTRAMUSCULAR | Status: AC
  Administered 2011-05-19: 4 mg via INTRAVENOUS
  Filled 2011-05-19: qty 2

## 2011-05-19 SURGICAL SUPPLY — 39 items
BAG HAMPER (MISCELLANEOUS) ×2 IMPLANT
CLOTH BEACON ORANGE TIMEOUT ST (SAFETY) ×2 IMPLANT
COVER LIGHT HANDLE STERIS (MISCELLANEOUS) ×4 IMPLANT
COVER MAYO STAND XLG (DRAPE) ×1 IMPLANT
DECANTER SPIKE VIAL GLASS SM (MISCELLANEOUS) ×2 IMPLANT
DRAPE STERI URO 9X17 APER PCH (DRAPES) ×2 IMPLANT
ELECT REM PT RETURN 9FT ADLT (ELECTROSURGICAL) ×2
ELECTRODE REM PT RTRN 9FT ADLT (ELECTROSURGICAL) ×1 IMPLANT
FORMALIN 10 PREFIL 480ML (MISCELLANEOUS) ×2 IMPLANT
GAUZE PACKING 2X5 YD STERILE (GAUZE/BANDAGES/DRESSINGS) ×2 IMPLANT
GLOVE BIOGEL PI IND STRL 7.0 (GLOVE) IMPLANT
GLOVE BIOGEL PI INDICATOR 7.0 (GLOVE) ×2
GLOVE ECLIPSE 6.5 STRL STRAW (GLOVE) ×1 IMPLANT
GLOVE ECLIPSE 7.0 STRL STRAW (GLOVE) ×4 IMPLANT
GLOVE ECLIPSE 9.0 STRL (GLOVE) ×3 IMPLANT
GLOVE INDICATOR STER SZ 9 (GLOVE) ×2 IMPLANT
GOWN STRL REIN 3XL LVL4 (GOWN DISPOSABLE) ×2 IMPLANT
GOWN STRL REIN XL XLG (GOWN DISPOSABLE) ×3 IMPLANT
IV NS IRRIG 3000ML ARTHROMATIC (IV SOLUTION) ×2 IMPLANT
KIT ROOM TURNOVER AP CYSTO (KITS) ×2 IMPLANT
MANIFOLD NEPTUNE II (INSTRUMENTS) ×2 IMPLANT
NS IRRIG 1000ML POUR BTL (IV SOLUTION) ×2 IMPLANT
PACK PERI GYN (CUSTOM PROCEDURE TRAY) ×2 IMPLANT
PAD ARMBOARD 7.5X6 YLW CONV (MISCELLANEOUS) ×2 IMPLANT
RETRACTOR LONRSTAR 16.6X16.6CM (MISCELLANEOUS) ×1 IMPLANT
RETRACTOR STAY HOOK 5MM (MISCELLANEOUS) ×1 IMPLANT
RETRACTOR STER APS 16.6X16.6CM (MISCELLANEOUS)
SET BASIN LINEN APH (SET/KITS/TRAYS/PACK) ×2 IMPLANT
SET IV ADMIN VERSALIGHT (MISCELLANEOUS) ×2 IMPLANT
SUT CHROMIC 0 CT 1 (SUTURE) ×16 IMPLANT
SUT CHROMIC 2 0 CT 1 (SUTURE) ×4 IMPLANT
SUT CHROMIC GUT BROWN 0 54 (SUTURE) IMPLANT
SUT CHROMIC GUT BROWN 0 54IN (SUTURE)
SUT PROLENE 2 0 SH 30 (SUTURE) ×1 IMPLANT
SUT VIC AB 0 CT2 8-18 (SUTURE) IMPLANT
SYR CONTROL 10ML LL (SYRINGE) ×1 IMPLANT
TRAY FOLEY CATH 14FR (SET/KITS/TRAYS/PACK) ×1 IMPLANT
VERSALIGHT (MISCELLANEOUS) ×2 IMPLANT
WATER STERILE IRR 1000ML POUR (IV SOLUTION) ×2 IMPLANT

## 2011-05-19 NOTE — Op Note (Signed)
POSTOP NOTE: Please refer to the brief OP note which includes the dictation of all surgical details. This constitutes the entire operative note.

## 2011-05-19 NOTE — OR Nursing (Signed)
RN witnessed surgeon update/re-examination performed this morning but appears in notes as done last evening at 9pm.  Surgeon aware.

## 2011-05-19 NOTE — Anesthesia Procedure Notes (Signed)
Procedure Name: Intubation Date/Time: 05/19/2011 7:55 AM Performed by: Despina Hidden Pre-anesthesia Checklist: Emergency Drugs available, Suction available, Patient identified and Patient being monitored Patient Re-evaluated:Patient Re-evaluated prior to inductionOxygen Delivery Method: Circle System Utilized Preoxygenation: Pre-oxygenation with 100% oxygen Intubation Type: IV induction and Circoid Pressure applied Ventilation: Mask ventilation without difficulty Laryngoscope Size: 3 and Mac Grade View: Grade I Tube type: Oral Tube size: 7.0 mm Number of attempts: 1 Airway Equipment and Method: stylet Placement Confirmation: ETT inserted through vocal cords under direct vision,  breath sounds checked- equal and bilateral and positive ETCO2 Secured at: 21 cm Tube secured with: Tape Dental Injury: Teeth and Oropharynx as per pre-operative assessment

## 2011-05-19 NOTE — Brief Op Note (Signed)
05/19/2011  10:05 AM  PATIENT:  Crystal Stein  40 y.o. female  PRE-OPERATIVE DIAGNOSIS:  Dysmenorrhea Dysfunctional Uterine Bleeding  POST-OPERATIVE DIAGNOSIS:  Dysmenorrhea; Dysfunctional Uterine Bleeding  PROCEDURE:  Procedure(s): HYSTERECTOMY VAGINAL  SURGEON:  Surgeon(s): Tilda Burrow, MD  PHYSICIAN ASSISTANT:   ASSISTANTS: Pervis Hocking, RnFA   ANESTHESIA:   local and general  EBL:  Total I/O In: 1400 [I.V.:1400] Out: 100 [Blood:100]  BLOOD ADMINISTERED:none  DRAINS: Urinary Catheter (Foley)   LOCAL MEDICATIONS USED:  MARCAINE 27CC  SPECIMEN:  Source of Specimen:  uterus and cervix  DISPOSITION OF SPECIMEN:  PATHOLOGY  COUNTS:  YES  TOURNIQUET:  * No tourniquets in log *  DICTATION: .Dragon Dictation patient was taken to the operating room prepped and draped for vaginal procedure timeout was conducted and procedure confirmed by  operative team. Ancef 1 g IV administered preprocedure. Weighted speculum was inserted in the vagina and cervix circumscribed with the cautery after Marcaine was injected in the cervico- vesicle Space.    Bladder was easily elevated off of the the lower uterine segment. Posterior colpotomy incision was easily performed left uterosacral ligament was clamped cut and suture ligated as was the right ligament. His were tagged and held, being of 0 chromic suture lower cardinal ligaments were clamped cut and suture ligated in similar fashion. Upper cardinal ligaments were similarly treated and hemostasis was good. Many portions of broad ligament were marched up in small bites clamping cutting and suture ligating using Zeppelin clamps, Mayo scissors and 0 chromic suture ligature. Utero-ovarian ligament and fallopian tube were clamped cut and suture ligated as was the round ligament. These pedicles were tagged. Bowel was held out of the way during the upper portion of the case by a moist laparotomy tape. Upon complete removal of the uterus the  pedicles were inspected and hemostasis was quite good except for some bleeding from the posterior cuff which was able to be addressed during cuff closure. The laparotomy clip was removed and, to 0 Prolene used to incorporate a small portion of the cul-de-sac to the medial aspect of the uterosacral ligaments sewing from lateral to medial on each side, to reduce potential for enterocele. Bladder flap peritoneum was loosely tagged to the cul-de-sac, and then the cuff closed in a transverse fashion rapid sutures of 0 chromic. Foley catheter inserted draining 200 cc of clear urine sponge and needle counts were correct patient went to recovery in stable condition Marcaine was injected into the uterosacral ligament prior to vaginal packing sponge and needle counts correct. PLAN OF CARE: Admit for overnight observation  PATIENT DISPOSITION:  PACU - hemodynamically stable.   Delay start of Pharmacological VTE agent (>24hrs) due to surgical blood loss or risk of bleeding:  {YES/NO/NOT APPLICABLE:20182

## 2011-05-19 NOTE — Transfer of Care (Signed)
Immediate Anesthesia Transfer of Care Note  Patient: Crystal Stein  Procedure(s) Performed:  HYSTERECTOMY VAGINAL  Patient Location: PACU  Anesthesia Type: General  Level of Consciousness: sedated and patient cooperative  Airway & Oxygen Therapy: Patient Spontanous Breathing and Patient connected to face mask oxygen  Post-op Assessment: Report given to PACU RN, Post -op Vital signs reviewed and stable and Patient moving all extremities  Post vital signs: Reviewed and stable  Complications: No apparent anesthesia complications

## 2011-05-19 NOTE — Anesthesia Postprocedure Evaluation (Addendum)
  Anesthesia Post-op Note  Patient: Crystal Stein  Procedure(s) Performed:  HYSTERECTOMY VAGINAL  Patient Location: PACU  Anesthesia Type: General  Level of Consciousness: awake, alert , oriented and patient cooperative  Airway and Oxygen Therapy: Patient Spontanous Breathing  Post-op Pain: 2 /10, mild  Post-op Assessment: Post-op Vital signs reviewed, Patient's Cardiovascular Status Stable, Respiratory Function Stable, Patent Airway, No signs of Nausea or vomiting and Pain level controlled  Post-op Vital Signs: Reviewed and stable  Complications: No apparent anesthesia complications 05/20/11  1130,  Patient discharged today.  No apparent anesthesia complications.

## 2011-05-19 NOTE — Anesthesia Preprocedure Evaluation (Signed)
Anesthesia Evaluation  Patient identified by MRN, date of birth, ID band Patient awake    Reviewed: Allergy & Precautions, H&P , NPO status , Patient's Chart, lab work & pertinent test results  History of Anesthesia Complications Negative for: history of anesthetic complications  Airway Mallampati: I  Neck ROM: Full    Dental  (+) Edentulous Upper and Edentulous Lower   Pulmonary asthma , COPD (am cough) clear to auscultation        Cardiovascular neg cardio ROS Regular Normal    Neuro/Psych PSYCHIATRIC DISORDERS Anxiety Depression Bipolar Disorder    GI/Hepatic GERD-  Medicated and Controlled,  Endo/Other    Renal/GU      Musculoskeletal   Abdominal   Peds  Hematology   Anesthesia Other Findings   Reproductive/Obstetrics                           Anesthesia Physical Anesthesia Plan  ASA: II  Anesthesia Plan: General   Post-op Pain Management:    Induction: Intravenous, Rapid sequence and Cricoid pressure planned  Airway Management Planned:   Additional Equipment:   Intra-op Plan:   Post-operative Plan: Extubation in OR  Informed Consent: I have reviewed the patients History and Physical, chart, labs and discussed the procedure including the risks, benefits and alternatives for the proposed anesthesia with the patient or authorized representative who has indicated his/her understanding and acceptance.     Plan Discussed with:   Anesthesia Plan Comments:         Anesthesia Quick Evaluation

## 2011-05-20 LAB — CBC
HCT: 36.9 % (ref 36.0–46.0)
Hemoglobin: 11.9 g/dL — ABNORMAL LOW (ref 12.0–15.0)
MCH: 32.2 pg (ref 26.0–34.0)
MCHC: 32.2 g/dL (ref 30.0–36.0)
MCV: 100 fL (ref 78.0–100.0)

## 2011-05-20 MED ORDER — HYDROMORPHONE 0.3 MG/ML IV SOLN
INTRAVENOUS | Status: AC
  Administered 2011-05-20: 7.5 mg
  Filled 2011-05-20: qty 25

## 2011-05-20 MED ORDER — HYDROCODONE-ACETAMINOPHEN 7.5-650 MG PO TABS: 1.0000 | ORAL_TABLET | Freq: Three times a day (TID) | ORAL | Status: DC | PRN

## 2011-05-20 NOTE — Progress Notes (Signed)
UR Chart Review Completed  

## 2011-05-20 NOTE — Addendum Note (Signed)
Addendum  created 05/20/11 1133 by Glynn Octave   Modules edited:Notes Section

## 2011-05-20 NOTE — Discharge Summary (Signed)
Physician Discharge Summary  Patient ID: Crystal Stein MRN: [id number] DOB/AGE: 12/14/1970 40 y.o.  Admit date: 05/19/2011 Discharge date: 05/20/2011  Admission Diagnoses:dysmenorrhea  Discharge Diagnoses: dysmenorrhea, resolved, s/p hysterectomy Active Problems:  * No active hospital problems. *    Discharged Condition: good  Hospital Course: Admitted thru day surgery, uncomplicated vaginal hysterectomy, discharge on day 1. Low O2 sats thru the night due to smoking history. CBC    Component Value Date/Time   WBC 11.4* 05/20/2011 0533   RBC 3.69* 05/20/2011 0533   HGB 11.9* 05/20/2011 0533   HCT 36.9 05/20/2011 0533   PLT 285 05/20/2011 0533   MCV 100.0 05/20/2011 0533   MCH 32.2 05/20/2011 0533   MCHC 32.2 05/20/2011 0533   RDW 14.4 05/20/2011 0533   LYMPHSABS 1.8 03/22/2009 1320   MONOABS 0.5 03/22/2009 1320   EOSABS 0.0 03/22/2009 1320   BASOSABS 0.0 03/22/2009 1320      Consults: none  Significant Diagnostic Studies: none  Treatments: surgery: Vaginal Hysterectomy  Discharge Exam: Blood pressure 90/60, pulse 71, temperature 98.6 F (37 C), temperature source Oral, resp. rate 16, height 5\' 4"  (1.626 m), weight 63.957 kg (141 lb), SpO2 96.00%. Resp: clear to auscultation bilaterally GI: soft, non-tender; bowel sounds normal; no masses,  no organomegaly   Disposition: Home or Self Care  Discharge Orders    Future Orders Please Complete By Expires   Diet - low sodium heart healthy      Increase activity slowly      Discharge instructions      Comments:   General Gynecological Post-Operative Instructions You may expect to feel dizzy, weak, and drowsy for as long as 24 hours after receiving the medicine that made you sleep (anesthetic). The following information pertains to your recovery period for the first 24 hours following surgery.  Do not drive a car, ride a bicycle, participate in physical activities, or take public transportation until you are done  taking narcotic pain  medicines or as directed by your caregiver.  Do not drink alcohol or take tranquilizers.  Do not take medicine that has not been prescribed by your caregiver.  Do not sign important papers or make important decisions while on narcotic pain medicines.  Have a responsible person with you.  CARE OF INCISION  Keep incision clean and dry. Take showers instead of baths until your caregiver gives you permission to take baths. Check with your caregiver if you have tubes coming from the wound site.  Avoid heavy lifting (more than 10 pounds/4.5 kilograms), pushing, or pulling.  Avoid activities that may risk injury to your surgical site.  Only take over-the-counter or prescription medicines for pain, discomfort, or fever as directed by your caregiver. Do not take aspirin. It can make you bleed. Take medicines (antibiotics) that kill germs as directed.  Call the office or go to the MAU if:  You feel sick to your stomach (nauseous).  You start to throw up (vomit).  You have trouble eating or drinking.  You have an oral temperature above 100.4.  You have constipation that is not helped by adjusting diet or increasing fluid intake. Pain medicines are a common cause of constipation.  SEEK IMMEDIATE MEDICAL CARE IF:  You have persistent dizziness.  You have difficulty breathing or a congested sounding (croupy) cough.  You have an oral temperature above 102.5, not controlled by medicine.  There is increasing pain or tenderness near or in the surgical site.  ExitCare Patient Information 2011 [place], [place].  YOUR CIGARETTE SMOKING IS DAMAGING YOUR LUNGS SIGNIFICANTLY.  PLEASE WORK WITH YOUR HUSBAND AND Crystal Stein TO STOP SMOKING.  NICOTINE PATCHES AND CHANTIX ARE TWO WAY TO REDUCE THE DIFFICULTY OF QUITTING SMOKING.  PLEASE LET me KNOW IF WE AT [facility] CAN ASSIST.   Driving Restrictions      Comments:   1 WEEK   Lifting restrictions      Comments:   NO LIFTING OVER 15 POUNDS X  4 WEEKS   Sexual Activity Restrictions      Comments:   NO SEX X 6 WEEKS   No wound care      Call MD for:      Scheduling Instructions:   PROBLEMS LISTED ABOVE     Current Discharge Medication List    CONTINUE these medications which have CHANGED   Details  hydrocodone-acetaminophen (LORCET PLUS) 7.5-650 MG per tablet Take 1 tablet by mouth every 8 (eight) hours as needed for pain. Pain  Qty: 40 tablet, Refills: 1      CONTINUE these medications which have NOT CHANGED   Details  ALPRAZolam (XANAX) 1 MG tablet Take 1 mg by mouth every 6 (six) hours as needed. anxiety     guaifenesin (HUMIBID E) 400 MG TABS Take 400 mg by mouth every 4 (four) hours. congestion     mirtazapine (REMERON) 30 MG tablet Take 30 mg by mouth at bedtime.      omeprazole (PRILOSEC) 20 MG capsule Take 20 mg by mouth daily.      PARoxetine (PAXIL-CR) 37.5 MG 24 hr tablet Take 75 mg by mouth every morning.      ziprasidone (GEODON) 40 MG capsule Take 40 mg by mouth 2 (two) times daily with a meal.         Follow-up Information    Follow up with Crystal Burrow, MD. (APPOINTMENT Jun 26 2010)    Contact information:   [facility] [address], [address] [place] [address] [phone]          Signed: [name] 05/20/2011, 9:08 AM

## 2011-05-20 NOTE — Progress Notes (Signed)
Pt discharged home via family; Pt and family given and explained all discharge instructions, carenotes, and prescriptions, especially on cigarrette smoking; pt and family stated understanding and denied questions/concerns; all f/u appointments in place; IV removed without complicaitons; pt stable at time of discharge

## 2011-05-20 NOTE — Progress Notes (Signed)
1 Day Post-Op Procedure(s): HYSTERECTOMY VAGINAL  Subjective: Patient reports incisional pain and tolerating PO.    Objective: I have reviewed patient's vital signs, intake and output and labs. Hemoglobin & Hematocrit     Component Value Date/Time   HGB 11.9* 05/20/2011 0533   HCT 36.9 05/20/2011 0533    General: alert, mild distress and rates pain as variable, but appears calm, mildly sedated GI: soft, non-tender; bowel sounds normal; no masses,  no organomegaly and vag pack still in place. foley to be removed Extremities: extremities normal, atraumatic, no cyanosis or edema and Homans sign is negative, no sign of DVT requiring o2 due to sedation and cigarrette history  Assessment: s/p Procedure(s): HYSTERECTOMY VAGINAL: stable, progressing well and cigarette abuse effects noticable  Plan: Advance diet Advance to PO medication Discontinue IV fluids Discharge home once voiding documented  LOS: 1 day    Crystal Stein 05/20/2011, 8:46 AM

## 2011-05-20 NOTE — Progress Notes (Signed)
Removed patients foley and vaginal packing, pt tolerated well.  Removed patients oxygen and ambulated in hallway, patient is very congested, O2 sats 85%. Pt encouraged to cough and deep breath, pt began coughing, sats 88-91%; pt coughed up large amount of mucus, pts sats up to 94%. Encouraged patient to deep breath, cough, and quit smoking. Pts sats maintained 88-93%. Pt wants to go home; notified MD who agreed patient was stable, but needed encouragement to quit smoking. MD wrote prescription for nicotine patches and gave care notes on smoking.

## 2011-05-22 LAB — TYPE AND SCREEN
DAT, IgG: NEGATIVE
PT AG Type: NEGATIVE
Unit division: 0
Unit division: 0
Unit division: 0

## 2011-05-26 ENCOUNTER — Encounter (HOSPITAL_COMMUNITY): Payer: Self-pay | Admitting: Obstetrics and Gynecology

## 2011-08-23 ENCOUNTER — Encounter (HOSPITAL_COMMUNITY): Payer: Self-pay

## 2011-08-23 ENCOUNTER — Emergency Department (HOSPITAL_COMMUNITY): Payer: Medicaid Other

## 2011-08-23 ENCOUNTER — Emergency Department (HOSPITAL_COMMUNITY)
Admission: EM | Admit: 2011-08-23 | Discharge: 2011-08-23 | Disposition: A | Discharge status: Home / Self Care | Payer: Medicaid Other | Attending: Emergency Medicine | Admitting: Emergency Medicine

## 2011-08-23 DIAGNOSIS — Z79899 Other long term (current) drug therapy: Secondary | ICD-10-CM | POA: Insufficient documentation

## 2011-08-23 DIAGNOSIS — R059 Cough, unspecified: Secondary | ICD-10-CM | POA: Insufficient documentation

## 2011-08-23 DIAGNOSIS — IMO0001 Reserved for inherently not codable concepts without codable children: Secondary | ICD-10-CM | POA: Insufficient documentation

## 2011-08-23 DIAGNOSIS — R000 Tachycardia, unspecified: Secondary | ICD-10-CM | POA: Insufficient documentation

## 2011-08-23 DIAGNOSIS — R05 Cough: Secondary | ICD-10-CM | POA: Insufficient documentation

## 2011-08-23 DIAGNOSIS — J4489 Other specified chronic obstructive pulmonary disease: Secondary | ICD-10-CM | POA: Insufficient documentation

## 2011-08-23 DIAGNOSIS — R51 Headache: Secondary | ICD-10-CM | POA: Insufficient documentation

## 2011-08-23 DIAGNOSIS — F319 Bipolar disorder, unspecified: Secondary | ICD-10-CM | POA: Insufficient documentation

## 2011-08-23 DIAGNOSIS — R6883 Chills (without fever): Secondary | ICD-10-CM | POA: Insufficient documentation

## 2011-08-23 DIAGNOSIS — K219 Gastro-esophageal reflux disease without esophagitis: Secondary | ICD-10-CM | POA: Insufficient documentation

## 2011-08-23 DIAGNOSIS — R070 Pain in throat: Secondary | ICD-10-CM | POA: Insufficient documentation

## 2011-08-23 DIAGNOSIS — F341 Dysthymic disorder: Secondary | ICD-10-CM | POA: Insufficient documentation

## 2011-08-23 DIAGNOSIS — J449 Chronic obstructive pulmonary disease, unspecified: Secondary | ICD-10-CM

## 2011-08-23 DIAGNOSIS — J3489 Other specified disorders of nose and nasal sinuses: Secondary | ICD-10-CM | POA: Insufficient documentation

## 2011-08-23 DIAGNOSIS — J189 Pneumonia, unspecified organism: Secondary | ICD-10-CM | POA: Insufficient documentation

## 2011-08-23 MED ORDER — ALBUTEROL SULFATE HFA 108 (90 BASE) MCG/ACT IN AERS
2.0000 | INHALATION_SPRAY | RESPIRATORY_TRACT | Status: DC
  Administered 2011-08-23: 2 via RESPIRATORY_TRACT
  Filled 2011-08-23: qty 6.7

## 2011-08-23 MED ORDER — LEVOFLOXACIN 500 MG PO TABS
ORAL_TABLET | ORAL | Status: AC
  Administered 2011-08-23: 500 mg
  Filled 2011-08-23: qty 1

## 2011-08-23 MED ORDER — ALBUTEROL SULFATE (5 MG/ML) 0.5% IN NEBU
5.0000 mg | INHALATION_SOLUTION | Freq: Once | RESPIRATORY_TRACT | Status: AC
  Administered 2011-08-23: 5 mg via RESPIRATORY_TRACT
  Filled 2011-08-23: qty 1

## 2011-08-23 MED ORDER — CEFIXIME 400 MG PO TABS: 400.0000 mg | ORAL_TABLET | Freq: Every day | ORAL | Status: AC

## 2011-08-23 MED ORDER — HYDROCOD POLST-CHLORPHEN POLST 10-8 MG/5ML PO LQCR
5.0000 mL | Freq: Once | ORAL | Status: AC
  Administered 2011-08-23: 5 mL via ORAL
  Filled 2011-08-23: qty 5

## 2011-08-23 MED ORDER — PREDNISONE 10 MG PO TABS: ORAL_TABLET | ORAL | Status: DC

## 2011-08-23 MED ORDER — PSEUDOEPHEDRINE HCL 60 MG PO TABS: ORAL_TABLET | ORAL | Status: DC

## 2011-08-23 MED ORDER — LEVOFLOXACIN 500 MG PO TABS: 500.0000 mg | ORAL_TABLET | Freq: Once | ORAL | Status: DC

## 2011-08-23 MED ORDER — IPRATROPIUM BROMIDE 0.02 % IN SOLN
0.5000 mg | Freq: Once | RESPIRATORY_TRACT | Status: AC
  Administered 2011-08-23: 0.5 mg via RESPIRATORY_TRACT
  Filled 2011-08-23: qty 2.5

## 2011-08-23 MED ORDER — PREDNISONE 20 MG PO TABS
60.0000 mg | ORAL_TABLET | Freq: Once | ORAL | Status: AC
  Administered 2011-08-23: 60 mg via ORAL
  Filled 2011-08-23: qty 3

## 2011-08-23 MED ORDER — PROMETHAZINE-CODEINE 6.25-10 MG/5ML PO SYRP: ORAL_SOLUTION | ORAL | Status: DC

## 2011-08-23 NOTE — ED Provider Notes (Signed)
History     CSN: [id number]  Arrival date & time 08/23/11  1847   None     Chief Complaint  Patient presents with  . Cough  . Nasal Congestion  . Wheezing  . Chills  . Generalized Body Aches    (Consider location/radiation/quality/duration/timing/severity/associated sxs/prior treatment) Patient is a 41 y.o. female presenting with cough and wheezing. The history is provided by the patient.  Cough This is a new problem. The current episode started more than 1 week ago. The problem occurs hourly. The problem has been gradually worsening. The cough is productive of sputum. There has been no fever. Associated symptoms include chills, headaches, rhinorrhea, sore throat and wheezing. Pertinent negatives include no chest pain and no shortness of breath. Her past medical history is significant for bronchitis and pneumonia.  Wheezing  Associated symptoms include rhinorrhea, sore throat, cough and wheezing. Pertinent negatives include no chest pain and no shortness of breath.    Past Medical History  Diagnosis Date  . Anxiety   . Depression   . Asthma   . 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 COPD (chronic obstructive pulmonary disease)   . GERD (gastroesophageal reflux disease)   . Bipolar disorder   . Blood transfusion 2006    Past Surgical History  Procedure Date  . Tennis elbow release 04/27/06    left; Romeo Apple, [facility]  . Multiple tooth extractions 10/07    full upper  . Orif proximal humerus fracture 08/06    left,  . Dilation and curettage of uterus June 2012    failed ablation  . Tubal ligation June 2012    [place]  . Bipolar disorder   . Hysteroscopy 01/27/2011    Procedure: HYSTEROSCOPY;  Surgeon: Tilda Burrow, MD;  Location: [facility];  Service: Gynecology;  Laterality: N/A;  . Vaginal hysterectomy 05/19/2011    Procedure: HYSTERECTOMY VAGINAL;  Surgeon: Tilda Burrow, MD;  Location: [facility];  Service: Gynecology;  Laterality: N/A;    Family  History  Problem Relation Age of Onset  . Anesthesia problems Neg Hx   . Hypotension Neg Hx   . Malignant hyperthermia Neg Hx   . Pseudochol deficiency Neg Hx     History  Substance Use Topics  . Smoking status: Current Everyday Smoker -- 1.0 packs/day for 22 years    Types: Cigarettes  . Smokeless tobacco: Not on file  . Alcohol Use: No    OB History    Grav Para Term Preterm Abortions TAB SAB Ect Mult Living                  Review of Systems  Constitutional: Positive for chills. Negative for activity change.       All ROS Neg except as noted in HPI  HENT: Positive for sore throat and rhinorrhea. Negative for nosebleeds and neck pain.   Eyes: Negative for photophobia and discharge.  Respiratory: Positive for cough and wheezing. Negative for shortness of breath.   Cardiovascular: Negative for chest pain and palpitations.  Gastrointestinal: Negative for abdominal pain and blood in stool.  Genitourinary: Negative for dysuria, frequency and hematuria.  Musculoskeletal: Negative for back pain and arthralgias.  Skin: Negative.   Neurological: Positive for headaches. Negative for dizziness, seizures and speech difficulty.  Psychiatric/Behavioral: Negative for hallucinations and confusion.    Allergies  Review of patient's allergies indicates no known allergies.  Home Medications   Current Outpatient Rx  Name Route Sig Dispense  Refill  . ALPRAZOLAM 1 MG PO TABS Oral Take 1 mg by mouth every 6 (six) hours as needed. anxiety     . GUAIFENESIN 400 MG PO TABS Oral Take 400 mg by mouth every 4 (four) hours. congestion     . HYDROCODONE-ACETAMINOPHEN 7.5-650 MG PO TABS Oral Take 1 tablet by mouth every 8 (eight) hours as needed for pain. Pain  40 tablet 1  . MIRTAZAPINE 30 MG PO TABS Oral Take 30 mg by mouth at bedtime.      . OMEPRAZOLE 20 MG PO CPDR Oral Take 20 mg by mouth daily.      Marland Kitchen PAROXETINE HCL ER 37.5 MG PO TB24 Oral Take 75 mg by mouth every morning.      Marland Kitchen  ZIPRASIDONE HCL 40 MG PO CAPS Oral Take 40 mg by mouth 2 (two) times daily with a meal.        BP 115/76  Pulse 102  Temp(Src) 98.1 F (36.7 C) (Oral)  Resp 20  Ht 5' 4.5" (1.638 m)  Wt 145 lb (65.772 kg)  BMI 24.50 kg/m2  SpO2 96%  LMP 04/23/2011  Physical Exam  Nursing note and vitals reviewed. Constitutional: She is oriented to person, place, and time. She appears well-developed and well-nourished.  Non-toxic appearance.  HENT:  Head: Normocephalic.  Right Ear: Tympanic membrane and external ear normal.  Left Ear: Tympanic membrane and external ear normal.  Eyes: EOM and lids are normal. Pupils are equal, round, and reactive to light.  Neck: Normal range of motion. Neck supple. Carotid bruit is not present.  Cardiovascular: Regular rhythm, normal heart sounds, intact distal pulses and normal pulses.  Tachycardia present.   Pulmonary/Chest: No respiratory distress. She has wheezes. She has rhonchi.       Inspiratory and expiratory wheezes bilaterally. Deep and prolonged cough present.  Abdominal: Soft. Bowel sounds are normal. There is no tenderness. There is no guarding.  Musculoskeletal: Normal range of motion.  Lymphadenopathy:       Head (right side): No submandibular adenopathy present.       Head (left side): No submandibular adenopathy present.    She has no cervical adenopathy.  Neurological: She is alert and oriented to person, place, and time. She has normal strength. No cranial nerve deficit or sensory deficit.  Skin: Skin is warm and dry.  Psychiatric: She has a normal mood and affect. Her speech is normal.    ED Course  Procedures (including critical care time) Oximetry 96% on room air. Within normal limits by my interpretation. Labs Reviewed - No data to display No results found.  Dx: Bilateral Pneumonia    MDM  I have reviewed nursing notes, vital signs, and all appropriate lab and imaging results for this patient.  Patient continues to have  significant amount of cough,wheezing, and congestion even after narcotic cough medication, and albuterol inhaler (2 puffs). Albuterol and Atrovent nebulizer treatment has been ordered. Chest x-ray suggestive bronchitis changes. There is also noted a vague mass in the right lung. In spite of albuterol inhaler, Atrovent and albuterol nebulizer, and cough medication, the patient continues to have inspiratory and expiratory wheezing, some shortness of breath at times, as well as a deep prolonged cough. X-ray discussed with radiologist, and CT scan has been ordered for this patient. CT scans suggest the unusual area on the right was an infiltrate. There is also an early infiltrate on the left. The CT scan results have been discussed with the patient. The case  has been reviewed with Dr Estell Harpin. Ambulated pt 150 ft in hall with out change in wheezing or cough. No weakness or syncope. Prescription for Suprax 400 mg one daily, Deltasone tapering dose, Sudafed 60 mg 3 times daily, and promethazine cough medicine every 6 hours given to the patient. Albuterol 2 puffs q4h given to pt. Patient strongly advised to stop smoking. Patient to to see her primary physician in 48 hours. Patient to return to the emergency department immediately if any changes, problems, or concerns.    Kathie Dike, PA 08/23/11 2148  Kathie Dike, [place] 08/23/11 2149

## 2011-08-23 NOTE — ED Notes (Signed)
Pt presents with cough, chills, nasal congestion, wheezing, chills, and body aches since 08/07/2011

## 2011-08-23 NOTE — Discharge Instructions (Signed)
Your x-rays suggest bilateral pneumonia. It is very important that she stop smoking police. Please use promethazine cough medications 10 mL every 6 hours as needed for cough, this medication contains narcotic, and may cause drowsiness. Please use with caution. Deltasone as directed daily, please take with food. Suprax 400 mg daily until all taken. Sudafed 60 mg 3 times daily for congestion. Please increase fluids. Please wash hands frequently. Please see her primary physician in the next 48 hours. Please return to the emergency department immediately if any changes, problems, or concerns.Pneumonia, Adult Pneumonia is an infection of the lungs.  CAUSES Pneumonia may be caused by bacteria or a virus. Usually, these infections are caused by breathing infectious particles into the lungs (respiratory tract). SYMPTOMS   Cough.   Fever.   Chest pain.   Increased rate of breathing.   Wheezing.   Mucus production.  DIAGNOSIS  If you have the common symptoms of pneumonia, your caregiver will typically confirm the diagnosis with a chest X-ray. The X-ray will show an abnormality in the lung (pulmonary infiltrate) if you have pneumonia. Other tests of your blood, urine, or sputum may be done to find the specific cause of your pneumonia. Your caregiver may also do tests (blood gases or pulse oximetry) to see how well your lungs are working. TREATMENT  Some forms of pneumonia may be spread to other people when you cough or sneeze. You may be asked to wear a mask before and during your exam. Pneumonia that is caused by bacteria is treated with antibiotic medicine. Pneumonia that is caused by the influenza virus may be treated with an antiviral medicine. Most other viral infections must run their course. These infections will not respond to antibiotics.  PREVENTION A pneumococcal shot (vaccine) is available to prevent a common bacterial cause of pneumonia. This is usually suggested for:  People over 8 years  old.   Patients on chemotherapy.   People with chronic lung problems, such as bronchitis or emphysema.   People with immune system problems.  If you are over 65 or have a high risk condition, you may receive the pneumococcal vaccine if you have not received it before. In some countries, a routine influenza vaccine is also recommended. This vaccine can help prevent some cases of pneumonia.You may be offered the influenza vaccine as part of your care. If you smoke, it is time to quit. You may receive instructions on how to stop smoking. Your caregiver can provide medicines and counseling to help you quit. HOME CARE INSTRUCTIONS   Cough suppressants may be used if you are losing too much rest. However, coughing protects you by clearing your lungs. You should avoid using cough suppressants if you can.   Your caregiver may have prescribed medicine if he or she thinks your pneumonia is caused by a bacteria or influenza. Finish your medicine even if you start to feel better.   Your caregiver may also prescribe an expectorant. This loosens the mucus to be coughed up.   Only take over-the-counter or prescription medicines for pain, discomfort, or fever as directed by your caregiver.   Do not smoke. Smoking is a common cause of bronchitis and can contribute to pneumonia. If you are a smoker and continue to smoke, your cough may last several weeks after your pneumonia has cleared.   A cold steam vaporizer or humidifier in your room or home may help loosen mucus.   Coughing is often worse at night. Sleeping in a semi-upright position in  a recliner or using a couple pillows under your head will help with this.   Get rest as you feel it is needed. Your body will usually let you know when you need to rest.  SEEK IMMEDIATE MEDICAL CARE IF:   Your illness becomes worse. This is especially true if you are elderly or weakened from any other disease.   You cannot control your cough with suppressants and  are losing sleep.   You begin coughing up blood.   You develop pain which is getting worse or is uncontrolled with medicines.   You have a fever.   Any of the symptoms which initially brought you in for treatment are getting worse rather than better.   You develop shortness of breath or chest pain.  MAKE SURE YOU:   Understand these instructions.   Will watch your condition.   Will get help right away if you are not doing well or get worse.  Document Released: 06/08/2005 Document Revised: 05/28/2011 Document Reviewed: 08/28/2010 [facility] Patient Information 2012 [place], [place].

## 2011-08-23 NOTE — ED Provider Notes (Signed)
Medical screening examination/treatment/procedure(s) were performed by non-physician practitioner and as supervising physician I was immediately available for consultation/collaboration.   Benny Lennert, MD 08/23/11 2250

## 2011-10-18 ENCOUNTER — Emergency Department (HOSPITAL_COMMUNITY)
Admission: EM | Admit: 2011-10-18 | Discharge: 2011-10-19 | Disposition: A | Discharge status: Home / Self Care | Payer: Medicaid Other | Attending: Emergency Medicine | Admitting: Emergency Medicine

## 2011-10-18 ENCOUNTER — Emergency Department (HOSPITAL_COMMUNITY): Payer: Medicaid Other

## 2011-10-18 ENCOUNTER — Encounter (HOSPITAL_COMMUNITY): Payer: Self-pay | Admitting: *Deleted

## 2011-10-18 DIAGNOSIS — Y92009 Unspecified place in unspecified non-institutional (private) residence as the place of occurrence of the external cause: Secondary | ICD-10-CM | POA: Insufficient documentation

## 2011-10-18 DIAGNOSIS — W108XXA Fall (on) (from) other stairs and steps, initial encounter: Secondary | ICD-10-CM | POA: Insufficient documentation

## 2011-10-18 DIAGNOSIS — S93402A Sprain of unspecified ligament of left ankle, initial encounter: Secondary | ICD-10-CM

## 2011-10-18 DIAGNOSIS — J4489 Other specified chronic obstructive pulmonary disease: Secondary | ICD-10-CM | POA: Insufficient documentation

## 2011-10-18 DIAGNOSIS — J449 Chronic obstructive pulmonary disease, unspecified: Secondary | ICD-10-CM | POA: Insufficient documentation

## 2011-10-18 DIAGNOSIS — S93409A Sprain of unspecified ligament of unspecified ankle, initial encounter: Secondary | ICD-10-CM | POA: Insufficient documentation

## 2011-10-18 DIAGNOSIS — F319 Bipolar disorder, unspecified: Secondary | ICD-10-CM | POA: Insufficient documentation

## 2011-10-18 DIAGNOSIS — Z79899 Other long term (current) drug therapy: Secondary | ICD-10-CM | POA: Insufficient documentation

## 2011-10-18 DIAGNOSIS — F341 Dysthymic disorder: Secondary | ICD-10-CM | POA: Insufficient documentation

## 2011-10-18 MED ORDER — OXYCODONE-ACETAMINOPHEN 5-325 MG PO TABS
1.0000 | ORAL_TABLET | Freq: Once | ORAL | Status: AC
  Administered 2011-10-18: 1 via ORAL
  Filled 2011-10-18: qty 1

## 2011-10-18 MED ORDER — IBUPROFEN 800 MG PO TABS
800.0000 mg | ORAL_TABLET | Freq: Once | ORAL | Status: AC
  Administered 2011-10-18: 800 mg via ORAL
  Filled 2011-10-18 (×2): qty 1

## 2011-10-18 NOTE — ED Provider Notes (Signed)
History     CSN: [id number]  Arrival date & time 10/18/11  2229   First MD Initiated Contact with Patient 10/18/11 2238      Chief Complaint  Patient presents with  . Ankle Pain    (Consider location/radiation/quality/duration/timing/severity/associated sxs/prior treatment) Patient is a 41 y.o. female presenting with ankle pain. The history is provided by the patient. No language interpreter was used.  Ankle Pain  The incident occurred less than 1 hour ago. The incident occurred at home. There was no injury mechanism (walking down stairs and slipped inverting L ankle.  no other injuries.). The pain is present in the left ankle. The pain is severe. The pain has been constant since onset. Associated symptoms include inability to bear weight. Pertinent negatives include no numbness, no loss of motion, no loss of sensation and no tingling. She reports no foreign bodies present. The symptoms are aggravated by bearing weight and palpation. She has tried nothing for the symptoms.    Past Medical History  Diagnosis Date  . Anxiety   . Depression   . Asthma   . 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 COPD (chronic obstructive pulmonary disease)   . GERD (gastroesophageal reflux disease)   . Bipolar disorder   . Blood transfusion 2006    Past Surgical History  Procedure Date  . Tennis elbow release 04/27/06    left; Romeo Apple, [facility]  . Multiple tooth extractions 10/07    full upper  . Orif proximal humerus fracture 08/06    left,  . Dilation and curettage of uterus June 2012    failed ablation  . Tubal ligation June 2012    [place]  . Bipolar disorder   . Hysteroscopy 01/27/2011    Procedure: HYSTEROSCOPY;  Surgeon: Tilda Burrow, MD;  Location: [facility];  Service: Gynecology;  Laterality: N/A;  . Vaginal hysterectomy 05/19/2011    Procedure: HYSTERECTOMY VAGINAL;  Surgeon: Tilda Burrow, MD;  Location: [facility];  Service: Gynecology;  Laterality: N/A;    Family  History  Problem Relation Age of Onset  . Anesthesia problems Neg Hx   . Hypotension Neg Hx   . Malignant hyperthermia Neg Hx   . Pseudochol deficiency Neg Hx     History  Substance Use Topics  . Smoking status: Current Everyday Smoker -- 1.0 packs/day for 22 years    Types: Cigarettes  . Smokeless tobacco: Not on file  . Alcohol Use: No    OB History    Grav Para Term Preterm Abortions TAB SAB Ect Mult Living                  Review of Systems  Musculoskeletal:       Ankle injury  Neurological: Negative for tingling, weakness and numbness.  All other systems reviewed and are negative.    Allergies  Review of patient's allergies indicates no known allergies.  Home Medications   Current Outpatient Rx  Name Route Sig Dispense Refill  . ALPRAZOLAM 1 MG PO TABS Oral Take 2 mg by mouth 3 (three) times daily as needed. anxiety    . GUAIFENESIN 400 MG PO TABS Oral Take 400 mg by mouth every 4 (four) hours. congestion     . HYDROCODONE-ACETAMINOPHEN 7.5-650 MG PO TABS Oral Take 1 tablet by mouth every 8 (eight) hours as needed for pain. Pain  40 tablet 1  . MIRTAZAPINE 30 MG PO TABS Oral Take 30 mg by mouth at  bedtime.      . OMEPRAZOLE 20 MG PO CPDR Oral Take 20 mg by mouth daily.      Marland Kitchen PAROXETINE HCL ER 37.5 MG PO TB24 Oral Take 75 mg by mouth every morning.      Marland Kitchen PREDNISONE 10 MG PO TABS  6,5,4,3,2,1 - take with food 21 tablet 0  . PROMETHAZINE-CODEINE 6.25-10 MG/5ML PO SYRP  10ml po q6h prn cough 200 mL 0  . PSEUDOEPHEDRINE HCL 60 MG PO TABS  1 po tid for congestion 30 tablet 0  . ZIPRASIDONE HCL 40 MG PO CAPS Oral Take 40 mg by mouth 2 (two) times daily with a meal.        BP 124/83  Pulse 102  Temp(Src) 98.5 F (36.9 C) (Oral)  Resp 20  Ht 5\' 4"  (1.626 m)  Wt 140 lb (63.504 kg)  BMI 24.03 kg/m2  SpO2 99%  LMP 04/23/2011  Physical Exam  Nursing note and vitals reviewed. Constitutional: She is oriented to person, place, and time. She appears well-developed  and well-nourished. No distress.  HENT:  Head: Normocephalic and atraumatic.  Eyes: EOM are normal.  Neck: Normal range of motion.  Cardiovascular: Normal rate, regular rhythm and normal heart sounds.   Pulmonary/Chest: Effort normal and breath sounds normal.  Abdominal: Soft. She exhibits no distension. There is no tenderness.  Musculoskeletal: She exhibits tenderness.       Left ankle: She exhibits decreased range of motion and swelling. She exhibits no ecchymosis, no deformity, no laceration and normal pulse. tenderness. Lateral malleolus tenderness found.       Feet:  Neurological: She is alert and oriented to person, place, and time.  Skin: Skin is warm and dry.  Psychiatric: She has a normal mood and affect. Judgment normal.    ED Course  Procedures (including critical care time)  Labs Reviewed - No data to display Dg Ankle Complete Left  10/19/2011  *RADIOLOGY REPORT*  Clinical Data: Post fall, now with swelling of the lateral aspect of the left ankle  LEFT ANKLE COMPLETE - 3+ VIEW  Comparison: None.  Findings: There is soft tissue swelling about the anterior and lateral aspect of the ankle without associated fracture.  The ankle mortise is preserved.  Joint spaces are preserved.  No definite joint effusion.  No radiopaque foreign body.  IMPRESSION: Soft tissue swelling about the anterior lateral aspect of the ankle without associated fracture.  Original Report Authenticated By: Waynard Reeds, M.D.     1. Left ankle sprain       MDM  X-rays are normal.  F/u with dr. Aviva Kluver Paul Half, PA 10/19/11 [phone]

## 2011-10-18 NOTE — ED Notes (Signed)
Pt reports she slipped on the wet steps and twisted her left ankle, obvious swelling noted

## 2011-10-19 NOTE — ED Provider Notes (Signed)
Medical screening examination/treatment/procedure(s) were performed by non-physician practitioner and as supervising physician I was immediately available for consultation/collaboration.  Cote Mayabb S. Danaye Sobh, MD 10/19/11 1610 

## 2011-10-19 NOTE — Discharge Instructions (Signed)
Ankle Sprain An ankle sprain is an injury to the strong, fibrous tissues (ligaments) that hold the bones of your ankle joint together.  CAUSES Ankle sprain usually is caused by a fall or by twisting your ankle. People who participate in sports are more prone to these types of injuries.  SYMPTOMS  Symptoms of ankle sprain include:  Pain in your ankle. The pain may be present at rest or only when you are trying to stand or walk.   Swelling.   Bruising. Bruising may develop immediately or within 1 to 2 days after your injury.   Difficulty standing or walking.  DIAGNOSIS  Your caregiver will ask you details about your injury and perform a physical exam of your ankle to determine if you have an ankle sprain. During the physical exam, your caregiver will press and squeeze specific areas of your foot and ankle. Your caregiver will try to move your ankle in certain ways. An X-ray exam may be done to be sure a bone was not broken or a ligament did not separate from one of the bones in your ankle (avulsion).  TREATMENT  Certain types of braces can help stabilize your ankle. Your caregiver can make a recommendation for this. Your caregiver may recommend the use of medication for pain. If your sprain is severe, your caregiver may refer you to a surgeon who helps to restore function to parts of your skeletal system (orthopedist) or a physical therapist. HOME CARE INSTRUCTIONS  Apply ice to your injury for 1 to 2 days or as directed by your caregiver. Applying ice helps to reduce inflammation and pain.  Put ice in a plastic bag.   Place a towel between your skin and the bag.   Leave the ice on for 15 to 20 minutes at a time, every 2 hours while you are awake.   Take over-the-counter or prescription medicines for pain, discomfort, or fever only as directed by your caregiver.   Keep your injured leg elevated, when possible, to lessen swelling.   If your caregiver recommends crutches, use them as  instructed. Gradually, put weight on the affected ankle. Continue to use crutches or a cane until you can walk without feeling pain in your ankle.   If you have a plaster splint, wear the splint as directed by your caregiver. Do not rest it on anything harder than a pillow the first 24 hours. Do not put weight on it. Do not get it wet. You may take it off to take a shower or bath.   You may have been given an elastic bandage to wear around your ankle to provide support. If the elastic bandage is too tight (you have numbness or tingling in your foot or your foot becomes cold and blue), adjust the bandage to make it comfortable.   If you have an air splint, you may blow more air into it or let air out to make it more comfortable. You may take your splint off at night and before taking a shower or bath.   Wiggle your toes in the splint several times per day if you are able.  SEEK MEDICAL CARE IF:   You have an increase in bruising, swelling, or pain.   Your toes feel cold.   Pain relief is not achieved with medication.  SEEK IMMEDIATE MEDICAL CARE IF: Your toes are numb or blue or you have severe pain. MAKE SURE YOU:   Understand these instructions.   Will watch your condition.     Will get help right away if you are not doing well or get worse.  Document Released: 06/08/2005 Document Revised: 05/28/2011 Document Reviewed: 01/11/2008 [facility] Patient Information 2012 [place], [place].Crutch Use You have been prescribed crutches to take weight off one of your lower legs or feet (extremities). When using crutches, make sure you are not putting pressure on the armpit (axilla). This could cause damage to the nerves that extend from your axilla to the hand and arm. When fitted properly the crutches should be 2 to 3 finger widths below the axilla. Your weight should be supported by your hand, and not by resting upon the crutch with the axilla. When walking, first step with the crutches, then swing  the healthy leg through and slightly ahead. When going up stairs, first step up with the healthy leg and then follow with the crutches and injured leg up to the same step, and so forth. If there is a handrail, hold both crutches in one hand, place your other hand on the handrail, and while placing your weight on your arms, lift your good leg to the step, then bring the crutches and the injured leg up to that step. Repeat for each step. When going down stairs, first step with the injured leg and crutches, following down with the healthy leg to the same step. Be very careful, as going down stairs with crutches is very challenging. If you feel wobbly or nervous, sit down and inch yourself down the stairs on your butt. To get up from a chair, hold injured leg forward, grab armrest with one hand and the top of the crutches with the other hand. Using these supports, pull yourself up to a standing position. Reverse this procedure for sitting. See your caregiver for follow up as suggested. If you are discharged in an ace wrap and develop numbness, tingling, swelling, or increased pain, loosen the ace wrap and re-wrap looser. If these problems persist, see your caregiver as needed. If you have been instructed to use partial weight bearing, bear (apply) the amount of weight as suggested by your caregiver. Do not bear weight in an amount that causes pain on the area of injury. Document Released: 06/05/2000 Document Revised: 05/28/2011 Document Reviewed: 08/13/2008 [facility] Patient Information 2012 [place], [place].Cryotherapy Cryotherapy means treatment with cold. Ice or gel packs can be used to reduce both pain and swelling. Ice is the most helpful within the first 24 to 48 hours after an injury or flareup from overusing a muscle or joint. Sprains, strains, spasms, burning pain, shooting pain, and aches can all be eased with ice. Ice can also be used when recovering from surgery. Ice is effective, has very few side  effects, and is safe for most people to use. PRECAUTIONS  Ice is not a safe treatment option for people with:  Raynaud's phenomenon. This is a condition affecting small blood vessels in the extremities. Exposure to cold may cause your problems to return.   Cold hypersensitivity. There are many forms of cold hypersensitivity, including:   Cold urticaria. Red, itchy hives appear on the skin when the tissues begin to warm after being iced.   Cold erythema. This is a red, itchy rash caused by exposure to cold.   Cold hemoglobinuria. Red blood cells break down when the tissues begin to warm after being iced. The hemoglobin that carry oxygen are passed into the urine because they cannot combine with blood proteins fast enough.   Numbness or altered sensitivity in the area being iced.  If you have any of the following conditions, do not use ice until you have discussed cryotherapy with your caregiver:  Heart conditions, such as arrhythmia, angina, or chronic heart disease.   High blood pressure.   Healing wounds or open skin in the area being iced.   Current infections.   Rheumatoid arthritis.   Poor circulation.   Diabetes.  Ice slows the blood flow in the region it is applied. This is beneficial when trying to stop inflamed tissues from spreading irritating chemicals to surrounding tissues. However, if you expose your skin to cold temperatures for too long or without the proper protection, you can damage your skin or nerves. Watch for signs of skin damage due to cold. HOME CARE INSTRUCTIONS Follow these tips to use ice and cold packs safely.  Place a dry or damp towel between the ice and skin. A damp towel will cool the skin more quickly, so you may need to shorten the time that the ice is used.   For a more rapid response, add gentle compression to the ice.   Ice for no more than 10 to 20 minutes at a time. The bonier the area you are icing, the less time it will take to get the  benefits of ice.   Check your skin after 5 minutes to make sure there are no signs of a poor response to cold or skin damage.   Rest 20 minutes or more in between uses.   Once your skin is numb, you can end your treatment. You can test numbness by very lightly touching your skin. The touch should be so light that you do not see the skin dimple from the pressure of your fingertip. When using ice, most people will feel these normal sensations in this order: cold, burning, aching, and numbness.   Do not use ice on someone who cannot communicate their responses to pain, such as small children or people with dementia.  HOW TO MAKE AN ICE PACK Ice packs are the most common way to use ice therapy. Other methods include ice massage, ice baths, and cryo-sprays. Muscle creams that cause a cold, tingly feeling do not offer the same benefits that ice offers and should not be used as a substitute unless recommended by your caregiver. To make an ice pack, do one of the following:  Place crushed ice or a bag of frozen vegetables in a sealable plastic bag. Squeeze out the excess air. Place this bag inside another plastic bag. Slide the bag into a pillowcase or place a damp towel between your skin and the bag.   Mix 3 parts water with 1 part rubbing alcohol. Freeze the mixture in a sealable plastic bag. When you remove the mixture from the freezer, it will be slushy. Squeeze out the excess air. Place this bag inside another plastic bag. Slide the bag into a pillowcase or place a damp towel between your skin and the bag.  SEEK MEDICAL CARE IF:  You develop white spots on your skin. This may give the skin a blotchy (mottled) appearance.   Your skin turns blue or pale.   Your skin becomes waxy or hard.   Your swelling gets worse.  MAKE SURE YOU:   Understand these instructions.   Will watch your condition.   Will get help right away if you are not doing well or get worse.  Document Released: 02/02/2011  Document Revised: 05/28/2011 Document Reviewed: 02/02/2011 [facility] Patient Information 2012 [place], [place].  Take your pain medicine as directed.  Take ibuprofen up to 600 mg every 8 hrs with food.  Wear the aso splint for 2-3 weeks.  Apply ice several times daily and elevate foot as much as possible.  Use the crutches as needed and bear weight as tolerated.  Follow up with dr. Romeo Apple.

## 2011-11-04 ENCOUNTER — Encounter: Payer: Self-pay | Admitting: Orthopedic Surgery

## 2011-11-04 ENCOUNTER — Ambulatory Visit (INDEPENDENT_AMBULATORY_CARE_PROVIDER_SITE_OTHER): Payer: Medicaid Other | Admitting: Orthopedic Surgery

## 2011-11-04 VITALS — BP 92/64 | Ht 64.0 in | Wt 140.0 lb

## 2011-11-04 DIAGNOSIS — S93409A Sprain of unspecified ligament of unspecified ankle, initial encounter: Secondary | ICD-10-CM | POA: Insufficient documentation

## 2011-11-04 NOTE — Patient Instructions (Signed)
WEAR ASO BRACE  X 6 WEEKS

## 2011-11-04 NOTE — Progress Notes (Signed)
  Subjective:    Crystal Stein is a 41 y.o. female who presents with pain and swelling in the left ankle after injuring it on April 28 stepping off a step at her home. She has severe swelling and presents with dull throbbing 8/10 constant pain which is worse when she's walking. She tried an ASO brace she did not like the way it made her foot look and it was causing some blistering. She is on ibuprofen and oxycodone for pain.  She has a history of waking cough swelling of the joints itching of the skin anxiety and depression easy bruising and seasonal allergies.   The following portions of the patient's history were reviewed and updated as appropriate: allergies, current medications, past family history, past medical history, past social history, past surgical history and problem list.    Objective:    BP 92/64  Ht 5\' 4"  (1.626 m)  Wt 140 lb (63.504 kg)  BMI 24.03 kg/m2  LMP 04/23/2011  Vital signs are stable as recorded  General appearance is normal  The patient is alert and oriented x3  The patient's mood and affect are normal  Gait assessment: Ambulatory with a slight limp The cardiovascular exam reveals normal pulses and temperature without edema swelling.  The lymphatic system is negative for palpable lymph nodes  The sensory exam is normal.  There are no pathologic reflexes.  Balance is normal.  Skin ankle area intact  Right ankle:   normal  Left ankle:   Swelling is noted still with moderate. Tenderness anterolateral ligaments with a +1 for sign restricted passive and active range of motion and weakness in plantarflexion dorsiflexion and eversion   Imaging: X-ray of left ankle show soft tissue swelling no fracture is done at the hospital  Assessment:    Ankle sprain    Plan:    Recommend ASO brace for 6 weeks in the brace can be removed

## 2012-06-12 ENCOUNTER — Emergency Department (HOSPITAL_COMMUNITY)
Admission: EM | Admit: 2012-06-12 | Discharge: 2012-06-13 | Disposition: A | Discharge status: Home / Self Care | Payer: Medicaid Other | Attending: Emergency Medicine | Admitting: Emergency Medicine

## 2012-06-12 ENCOUNTER — Encounter (HOSPITAL_COMMUNITY): Payer: Self-pay | Admitting: *Deleted

## 2012-06-12 ENCOUNTER — Emergency Department (HOSPITAL_COMMUNITY): Payer: Medicaid Other

## 2012-06-12 DIAGNOSIS — J449 Chronic obstructive pulmonary disease, unspecified: Secondary | ICD-10-CM | POA: Insufficient documentation

## 2012-06-12 DIAGNOSIS — J45909 Unspecified asthma, uncomplicated: Secondary | ICD-10-CM | POA: Insufficient documentation

## 2012-06-12 DIAGNOSIS — Y929 Unspecified place or not applicable: Secondary | ICD-10-CM | POA: Insufficient documentation

## 2012-06-12 DIAGNOSIS — Z9889 Other specified postprocedural states: Secondary | ICD-10-CM | POA: Insufficient documentation

## 2012-06-12 DIAGNOSIS — J4489 Other specified chronic obstructive pulmonary disease: Secondary | ICD-10-CM | POA: Insufficient documentation

## 2012-06-12 DIAGNOSIS — Z79899 Other long term (current) drug therapy: Secondary | ICD-10-CM | POA: Insufficient documentation

## 2012-06-12 DIAGNOSIS — F319 Bipolar disorder, unspecified: Secondary | ICD-10-CM | POA: Insufficient documentation

## 2012-06-12 DIAGNOSIS — F411 Generalized anxiety disorder: Secondary | ICD-10-CM | POA: Insufficient documentation

## 2012-06-12 DIAGNOSIS — Z7901 Long term (current) use of anticoagulants: Secondary | ICD-10-CM | POA: Insufficient documentation

## 2012-06-12 DIAGNOSIS — S52502A Unspecified fracture of the lower end of left radius, initial encounter for closed fracture: Secondary | ICD-10-CM

## 2012-06-12 DIAGNOSIS — K219 Gastro-esophageal reflux disease without esophagitis: Secondary | ICD-10-CM | POA: Insufficient documentation

## 2012-06-12 DIAGNOSIS — Y939 Activity, unspecified: Secondary | ICD-10-CM | POA: Insufficient documentation

## 2012-06-12 DIAGNOSIS — S52599A Other fractures of lower end of unspecified radius, initial encounter for closed fracture: Secondary | ICD-10-CM | POA: Insufficient documentation

## 2012-06-12 DIAGNOSIS — F17200 Nicotine dependence, unspecified, uncomplicated: Secondary | ICD-10-CM | POA: Insufficient documentation

## 2012-06-12 DIAGNOSIS — W19XXXA Unspecified fall, initial encounter: Secondary | ICD-10-CM | POA: Insufficient documentation

## 2012-06-12 MED ORDER — OXYCODONE-ACETAMINOPHEN 5-325 MG PO TABS
2.0000 | ORAL_TABLET | Freq: Once | ORAL | Status: AC
  Administered 2012-06-12: 2 via ORAL
  Filled 2012-06-12: qty 2

## 2012-06-12 MED ORDER — LIDOCAINE-EPINEPHRINE (PF) 2 %-1:200000 IJ SOLN
INTRAMUSCULAR | Status: AC
  Administered 2012-06-12
  Filled 2012-06-12: qty 20

## 2012-06-12 NOTE — ED Notes (Signed)
Pt c/o left wrist pain.

## 2012-06-12 NOTE — ED Notes (Signed)
Pt given an ice pack  °

## 2012-06-12 NOTE — ED Provider Notes (Signed)
History   This chart was scribed for Lyanne Co, MD by Leone Payor, ED Scribe. This patient was seen in room APA12/APA12 and the patient's care was started at 2305.   CSN: [id number]  Arrival date & time 06/12/12  1949   First MD Initiated Contact with Patient 06/12/12 2305      Chief Complaint  Patient presents with  . Wrist Pain     The history is provided by the patient. No language interpreter was used.   Crystal Stein is a 41 y.o. female who presents to the Emergency Department complaining of a new, constant, gradually worsening left wrist pain starting 4 hours ago. Pt has pain to her left hand after a fall earlier this evening. Pt does not have any associated symptoms. She reports being other wise healthy. Pt denies having any numbness, tingling.   Pt has h/o COPD, GERD, asthma.  Pt is a current everyday smoker but denies alcohol use. Past Medical History  Diagnosis Date  . Anxiety   . Depression   . Asthma   . 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 COPD (chronic obstructive pulmonary disease)   . GERD (gastroesophageal reflux disease)   . Bipolar disorder   . Blood transfusion 2006    Past Surgical History  Procedure Date  . Tennis elbow release 04/27/06    left; Romeo Apple, [facility]  . Multiple tooth extractions 10/07    full upper  . Orif proximal humerus fracture 08/06    left,  . Dilation and curettage of uterus June 2012    failed ablation  . Tubal ligation June 2012    [facility]  . Bipolar disorder   . Hysteroscopy 01/27/2011    Procedure: HYSTEROSCOPY;  Surgeon: Tilda Burrow, MD;  Location: [facility];  Service: Gynecology;  Laterality: N/A;  . Vaginal hysterectomy 05/19/2011    Procedure: HYSTERECTOMY VAGINAL;  Surgeon: Tilda Burrow, MD;  Location: [facility];  Service: Gynecology;  Laterality: N/A;    Family History  Problem Relation Age of Onset  . Anesthesia problems Neg Hx   . Hypotension Neg Hx   . Malignant hyperthermia Neg Hx    . Pseudochol deficiency Neg Hx   . Diabetes      History  Substance Use Topics  . Smoking status: Current Every Day Smoker -- 1.0 packs/day for 22 years    Types: Cigarettes  . Smokeless tobacco: Not on file  . Alcohol Use: No    No OB history provided.   Review of Systems A complete 10 system review of systems was obtained and all systems are negative except as noted in the HPI and PMH.   Allergies  Review of patient's allergies indicates no known allergies.  Home Medications   Current Outpatient Rx  Name  Route  Sig  Dispense  Refill  . ALPRAZOLAM 1 MG PO TABS   Oral   Take 2 mg by mouth 3 (three) times daily as needed. anxiety         . GUAIFENESIN 400 MG PO TABS   Oral   Take 400 mg by mouth every 4 (four) hours. congestion          . HYDROCODONE-ACETAMINOPHEN 7.5-650 MG PO TABS   Oral   Take 1 tablet by mouth every 8 (eight) hours as needed for pain. Pain    40 tablet   1   . MIRTAZAPINE 30 MG PO TABS   Oral  Take 30 mg by mouth at bedtime.           . OMEPRAZOLE 20 MG PO CPDR   Oral   Take 20 mg by mouth daily.           Marland Kitchen PAROXETINE HCL ER 37.5 MG PO TB24   Oral   Take 75 mg by mouth every morning.           Marland Kitchen PREDNISONE 10 MG PO TABS      6,5,4,3,2,1 - take with food   21 tablet   0   . PROMETHAZINE-CODEINE 6.25-10 MG/5ML PO SYRP      10ml po q6h prn cough   200 mL   0   . PSEUDOEPHEDRINE HCL 60 MG PO TABS      1 po tid for congestion   30 tablet   0   . ZIPRASIDONE HCL 40 MG PO CAPS   Oral   Take 40 mg by mouth 2 (two) times daily with a meal.           . AMBIEN PO   Oral   Take by mouth.           BP 106/65  Pulse 113  Temp 98.4 F (36.9 C)  Resp 20  Ht 5' 4.5" (1.638 m)  Wt 149 lb (67.586 kg)  BMI 25.18 kg/m2  SpO2 98%  LMP 04/23/2011  Physical Exam  Constitutional: She is oriented to person, place, and time. She appears well-developed and well-nourished.  HENT:  Head: Normocephalic.  Eyes: EOM  are normal.  Neck: Normal range of motion.  Cardiovascular:       Normal left radial pulse.  Pulmonary/Chest: Effort normal.  Abdominal: She exhibits no distension.  Musculoskeletal: Normal range of motion.       Swelling over left distal radius.  Normal grip over left hand.  Small abrasions over 3rd and 5th distal metacarpals.   Neurological: She is alert and oriented to person, place, and time.  Psychiatric: She has a normal mood and affect.    ED Course  Reduction of fracture Performed by: Lyanne Co Authorized by: Lyanne Co Consent: Verbal consent obtained. Risks and benefits: risks, benefits and alternatives were discussed Consent given by: patient Patient identity confirmed: verbally with patient Time out: Immediately prior to procedure a "time out" was called to verify the correct patient, procedure, equipment, support staff and site/side marked as required. Preparation: Patient was prepped and draped in the usual sterile fashion. Local anesthesia used: Hematoma block (see note) Patient sedated: no Patient tolerance: Patient tolerated the procedure well with no immediate complications. Comments: Reduction of left distal radius angulated fracture.  Performed using manipulation and traction  SPLINT APPLICATION Performed by: Lyanne Co Authorized by: Lyanne Co Consent: Verbal consent obtained. Consent given by: patient Location details: left arm Splint type: sugar tong Supplies used: cotton padding, elastic bandage and Ortho-Glass Post-procedure: The splinted body part was neurovascularly unchanged following the procedure. Patient tolerance: Patient tolerated the procedure well with no immediate complications.   HEMATOMA BLOCK Verbal consent obtained Sterile technique utilized Anesthesia: 2% Lidocaine with epi Amount: 10 cc Procedure: 25-gauge needle used to inject the hematoma around the fracture I entered the wrist at the dorsal surface of  the left distal radius Patient tolerated the procedure well No complications   DIAGNOSTIC STUDIES: Oxygen Saturation is 98% on room air, normal by my interpretation.    COORDINATION OF CARE:  11:26 PM Discussed treatment  plan which includes pain medication with pt at bedside and pt agreed to plan.     Labs Reviewed - No data to display Dg Wrist Complete Left  06/12/2012  *RADIOLOGY REPORT*  Clinical Data: Fall, pain.  LEFT WRIST - COMPLETE 3+ VIEW  Comparison: None.  Findings: The patient has an impacted distal radius fracture with mild dorsal angulation and displacement.  No other fracture is identified.  No dislocation.  Soft tissue swelling noted.  IMPRESSION: Mildly impacted distal radius fracture.   Original Report Authenticated By: Holley Dexter, M.D.    I personally reviewed the imaging tests through PACS system I reviewed available ER/hospitalization records through the EMR   1. Distal radius fracture, left       MDM  Patient with evidence of left distal radius angulated and impacted fracture.  Hematoma block at the bedside.  Reduced at bedside.  Splinted at the bedside.  The patient will followup with orthopedic surgeon.  Post reduction films pending.  Patient has a pain contract and therefore use her pain medicine at home.  She understands return to ER for new or worsening symptoms      I personally performed the services described in this documentation, which was scribed in my presence. The recorded information has been reviewed and is accurate.      Lyanne Co, MD 06/13/12 [phone]

## 2012-06-13 ENCOUNTER — Emergency Department (HOSPITAL_COMMUNITY): Payer: Medicaid Other

## 2012-06-16 ENCOUNTER — Encounter (HOSPITAL_COMMUNITY): Payer: Self-pay

## 2012-06-16 ENCOUNTER — Ambulatory Visit (INDEPENDENT_AMBULATORY_CARE_PROVIDER_SITE_OTHER): Payer: Medicaid Other | Admitting: Orthopedic Surgery

## 2012-06-16 ENCOUNTER — Encounter: Payer: Self-pay | Admitting: Orthopedic Surgery

## 2012-06-16 VITALS — BP 100/70 | Ht 64.5 in | Wt 150.0 lb

## 2012-06-16 DIAGNOSIS — M24549 Contracture, unspecified hand: Secondary | ICD-10-CM

## 2012-06-16 DIAGNOSIS — S52509A Unspecified fracture of the lower end of unspecified radius, initial encounter for closed fracture: Secondary | ICD-10-CM

## 2012-06-16 DIAGNOSIS — S52599A Other fractures of lower end of unspecified radius, initial encounter for closed fracture: Secondary | ICD-10-CM

## 2012-06-16 NOTE — Patient Instructions (Addendum)
Surgery Friday  Be at the hospital at 730  Do not eat after midnight except for medications and sip of water

## 2012-06-16 NOTE — Progress Notes (Signed)
Patient ID: Crystal Stein, female   DOB: 02-13-1971, 41 y.o.   MRN: [id number] Chief Complaint  Patient presents with  . Arm Pain    left wrist pain since injury 06/12/12    Past Medical History  Diagnosis Date  . Anxiety   . Depression   . Asthma   . 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 COPD (chronic obstructive pulmonary disease)   . GERD (gastroesophageal reflux disease)   . Bipolar disorder   . Blood transfusion 2006    Past Surgical History  Procedure Date  . Tennis elbow release 04/27/06    left; Romeo Apple, [facility]  . Multiple tooth extractions 10/07    full upper  . Orif proximal humerus fracture 08/06    left,  . Dilation and curettage of uterus June 2012    failed ablation  . Tubal ligation June 2012    [place]  . Bipolar disorder   . Hysteroscopy 01/27/2011    Procedure: HYSTEROSCOPY;  Surgeon: Tilda Burrow, MD;  Location: [facility];  Service: Gynecology;  Laterality: N/A;  . Vaginal hysterectomy 05/19/2011    Procedure: HYSTERECTOMY VAGINAL;  Surgeon: Tilda Burrow, MD;  Location: [facility];  Service: Gynecology;  Laterality: N/A;    History   Social History  . Marital Status: Married    Spouse Name: N/A    Number of Children: N/A  . Years of Education: N/A   Occupational History  . Not on file.   Social History Main Topics  . Smoking status: Current Every Day Smoker -- 1.0 packs/day for 22 years    Types: Cigarettes  . Smokeless tobacco: Not on file  . Alcohol Use: No  . Drug Use: No  . Sexually Active:    Other Topics Concern  . Not on file   Social History Narrative  . No narrative on file   History   Social History Narrative  . No narrative on file   Family History  Problem Relation Age of Onset  . Anesthesia problems Neg Hx   . Hypotension Neg Hx   . Malignant hyperthermia Neg Hx   . Pseudochol deficiency Neg Hx   . Diabetes     BP 100/70  Ht 5' 4.5" (1.638 m)  Wt 150 lb (68.04 kg)  BMI 25.35 kg/m2  LMP  04/23/2011 Physical Exam  Nursing note and vitals reviewed. Constitutional: She is oriented to person, place, and time. She appears well-developed and well-nourished.       Thin, small  HENT:  Head: Normocephalic and atraumatic.  Eyes: Pupils are equal, round, and reactive to light.  Neck: Normal range of motion. Neck supple. No JVD present. No tracheal deviation present. No thyromegaly present.  Cardiovascular: Normal rate and intact distal pulses.   Respiratory: Effort normal.  GI: Soft.  Lymphadenopathy:    She has no cervical adenopathy.  Neurological: She is alert and oriented to person, place, and time. She has normal reflexes.  Skin: Skin is warm and dry. No rash noted. No erythema. No pallor.  Psychiatric: She has a normal mood and affect. Her behavior is normal. Judgment and thought content normal.   Ortho Exam Lower extremity exam  Ambulation is normal.  The right and left lower extremity:  Inspection and palpation revealed no tenderness or abnormality in alignment in the lower extremities. Range of motion is full.  Strength is grade 5.  All joints are stable.   Upper extremity exam  The  right upper extremity:   Inspection and palpation revealed no abnormalities in the upper extremities.   Range of motion is full without contracture.  Motor exam is normal with grade 5 strength.  The joints are fully reduced without subluxation.  There is no atrophy or tremor and muscle tone is normal.  All joints are stable.   LEFT upper extremity is in a splint. Fingers are very pink, painful range of motion. Further examination deferred. Skin is intact.  X-ray show dorsally angulated, shortened distal radius fracture LEFT wrist.  The patient will require open reduction, internal fixation. Risk benefit ratio, explained to the patient.  Patient now for surgical treatment.  Recommend volar plating, LEFT wrist for LEFT distal radius fracture, closed, metaphyseal,  diaphyseal area, mild comminution with dorsal tilt and apex volar angulation

## 2012-06-17 ENCOUNTER — Ambulatory Visit (HOSPITAL_COMMUNITY): Payer: Medicaid Other | Admitting: Anesthesiology

## 2012-06-17 ENCOUNTER — Encounter (HOSPITAL_COMMUNITY)
Admission: RE | Disposition: A | Discharge status: Home / Self Care | Payer: Self-pay | Source: Ambulatory Visit | Attending: Orthopedic Surgery

## 2012-06-17 ENCOUNTER — Ambulatory Visit (HOSPITAL_COMMUNITY): Payer: Medicaid Other

## 2012-06-17 ENCOUNTER — Ambulatory Visit (HOSPITAL_COMMUNITY)
Admission: RE | Admit: 2012-06-17 | Discharge: 2012-06-17 | Disposition: A | Discharge status: Home / Self Care | Payer: Medicaid Other | Source: Ambulatory Visit | Attending: Orthopedic Surgery | Admitting: Orthopedic Surgery

## 2012-06-17 ENCOUNTER — Encounter (HOSPITAL_COMMUNITY): Payer: Self-pay | Admitting: Anesthesiology

## 2012-06-17 ENCOUNTER — Encounter (HOSPITAL_COMMUNITY): Payer: Self-pay

## 2012-06-17 DIAGNOSIS — S52599A Other fractures of lower end of unspecified radius, initial encounter for closed fracture: Secondary | ICD-10-CM | POA: Insufficient documentation

## 2012-06-17 DIAGNOSIS — S52509A Unspecified fracture of the lower end of unspecified radius, initial encounter for closed fracture: Secondary | ICD-10-CM

## 2012-06-17 DIAGNOSIS — X58XXXA Exposure to other specified factors, initial encounter: Secondary | ICD-10-CM | POA: Insufficient documentation

## 2012-06-17 DIAGNOSIS — G5600 Carpal tunnel syndrome, unspecified upper limb: Secondary | ICD-10-CM

## 2012-06-17 DIAGNOSIS — J45909 Unspecified asthma, uncomplicated: Secondary | ICD-10-CM | POA: Insufficient documentation

## 2012-06-17 HISTORY — PX: CARPAL TUNNEL RELEASE: SHX101

## 2012-06-17 HISTORY — PX: ORIF WRIST FRACTURE: SHX2133

## 2012-06-17 LAB — BASIC METABOLIC PANEL
BUN: 8 mg/dL (ref 6–23)
GFR calc Af Amer: >90 mL/min (ref >90)
GFR calc non Af Amer: >90 mL/min (ref >90)
Potassium: 2.6 mEq/L — CL (ref 3.5–5.1)

## 2012-06-17 LAB — CBC
Hemoglobin: 13.4 g/dL (ref 12.0–15.0)
MCHC: 35.3 g/dL (ref 30.0–36.0)
Platelets: 180 10*3/uL (ref 150–400)
RDW: 15.3 % (ref 11.5–15.5)

## 2012-06-17 SURGERY — OPEN REDUCTION INTERNAL FIXATION (ORIF) WRIST FRACTURE
Anesthesia: General | Site: Wrist | Laterality: Left | Wound class: Clean

## 2012-06-17 MED ORDER — FENTANYL CITRATE 0.05 MG/ML IJ SOLN
25.0000 ug | INTRAMUSCULAR | Status: DC | PRN
  Administered 2012-06-17 (×2): 50 ug via INTRAVENOUS

## 2012-06-17 MED ORDER — MIDAZOLAM HCL 2 MG/2ML IJ SOLN
1.0000 mg | INTRAMUSCULAR | Status: DC | PRN
  Administered 2012-06-17: 2 mg via INTRAVENOUS

## 2012-06-17 MED ORDER — MUPIROCIN 2 % EX OINT
TOPICAL_OINTMENT | Freq: Once | CUTANEOUS | Status: AC
  Administered 2012-06-17: 1 via NASAL

## 2012-06-17 MED ORDER — MIDAZOLAM HCL 2 MG/2ML IJ SOLN
INTRAMUSCULAR | Status: AC
  Filled 2012-06-17: qty 2

## 2012-06-17 MED ORDER — SUCCINYLCHOLINE CHLORIDE 20 MG/ML IJ SOLN
INTRAMUSCULAR | Status: AC
  Filled 2012-06-17: qty 1

## 2012-06-17 MED ORDER — LIDOCAINE HCL (PF) 1 % IJ SOLN
INTRAMUSCULAR | Status: AC
  Filled 2012-06-17: qty 5

## 2012-06-17 MED ORDER — ONDANSETRON HCL 4 MG/2ML IJ SOLN
4.0000 mg | Freq: Once | INTRAMUSCULAR | Status: AC
Start: 2012-06-17 — End: 2012-06-17
  Administered 2012-06-17: 4 mg via INTRAVENOUS

## 2012-06-17 MED ORDER — FENTANYL CITRATE 0.05 MG/ML IJ SOLN
INTRAMUSCULAR | Status: AC
  Filled 2012-06-17: qty 2

## 2012-06-17 MED ORDER — CEFAZOLIN SODIUM-DEXTROSE 2-3 GM-% IV SOLR
INTRAVENOUS | Status: AC
  Filled 2012-06-17: qty 50

## 2012-06-17 MED ORDER — ARTIFICIAL TEARS OP OINT
TOPICAL_OINTMENT | OPHTHALMIC | Status: AC
  Filled 2012-06-17: qty 3.5

## 2012-06-17 MED ORDER — LACTATED RINGERS IV SOLN
INTRAVENOUS | Status: DC | PRN
  Administered 2012-06-17 (×2): via INTRAVENOUS

## 2012-06-17 MED ORDER — FENTANYL CITRATE 0.05 MG/ML IJ SOLN
INTRAMUSCULAR | Status: DC | PRN
  Administered 2012-06-17 (×3): 50 ug via INTRAVENOUS
  Administered 2012-06-17: 100 ug via INTRAVENOUS
  Administered 2012-06-17: 50 ug via INTRAVENOUS

## 2012-06-17 MED ORDER — LIDOCAINE HCL (CARDIAC) 10 MG/ML IV SOLN
INTRAVENOUS | Status: DC | PRN
  Administered 2012-06-17: 50 mg via INTRAVENOUS

## 2012-06-17 MED ORDER — ALBUTEROL SULFATE HFA 108 (90 BASE) MCG/ACT IN AERS
INHALATION_SPRAY | RESPIRATORY_TRACT | Status: AC
  Filled 2012-06-17: qty 6.7

## 2012-06-17 MED ORDER — PROPOFOL 10 MG/ML IV EMUL
INTRAVENOUS | Status: AC
  Filled 2012-06-17: qty 20

## 2012-06-17 MED ORDER — CHLORHEXIDINE GLUCONATE 4 % EX LIQD: 60.0000 mL | Freq: Once | CUTANEOUS | Status: DC

## 2012-06-17 MED ORDER — ONDANSETRON HCL 4 MG/2ML IJ SOLN
4.0000 mg | Freq: Once | INTRAMUSCULAR | Status: AC | PRN
  Administered 2012-06-17: 4 mg via INTRAVENOUS

## 2012-06-17 MED ORDER — CEFAZOLIN SODIUM-DEXTROSE 2-3 GM-% IV SOLR
INTRAVENOUS | Status: DC | PRN
  Administered 2012-06-17: 2 g via INTRAVENOUS

## 2012-06-17 MED ORDER — ONDANSETRON HCL 4 MG/2ML IJ SOLN
INTRAMUSCULAR | Status: AC
  Filled 2012-06-17: qty 2

## 2012-06-17 MED ORDER — PROPOFOL 10 MG/ML IV EMUL
INTRAVENOUS | Status: DC | PRN
  Administered 2012-06-17: 120 mg via INTRAVENOUS
  Administered 2012-06-17: 30 mg via INTRAVENOUS

## 2012-06-17 MED ORDER — MUPIROCIN 2 % EX OINT
TOPICAL_OINTMENT | CUTANEOUS | Status: AC
  Filled 2012-06-17: qty 22

## 2012-06-17 MED ORDER — CEFAZOLIN SODIUM-DEXTROSE 2-3 GM-% IV SOLR: 2.0000 g | INTRAVENOUS | Status: DC

## 2012-06-17 MED ORDER — BUPIVACAINE-EPINEPHRINE PF 0.5-1:200000 % IJ SOLN
INTRAMUSCULAR | Status: AC
  Filled 2012-06-17: qty 10

## 2012-06-17 MED ORDER — LACTATED RINGERS IV SOLN
INTRAVENOUS | Status: DC
  Administered 2012-06-17: 11:00:00 via INTRAVENOUS

## 2012-06-17 MED ORDER — MIDAZOLAM HCL 5 MG/5ML IJ SOLN
INTRAMUSCULAR | Status: DC | PRN
  Administered 2012-06-17: 2 mg via INTRAVENOUS

## 2012-06-17 MED ORDER — SODIUM CHLORIDE 0.9 % IR SOLN
Status: DC | PRN
  Administered 2012-06-17: 1000 mL

## 2012-06-17 MED ORDER — SUCCINYLCHOLINE CHLORIDE 20 MG/ML IJ SOLN
INTRAMUSCULAR | Status: DC | PRN
  Administered 2012-06-17: 100 mg via INTRAVENOUS

## 2012-06-17 MED ORDER — ALBUTEROL SULFATE HFA 108 (90 BASE) MCG/ACT IN AERS
INHALATION_SPRAY | RESPIRATORY_TRACT | Status: DC | PRN
  Administered 2012-06-17: 6 via RESPIRATORY_TRACT

## 2012-06-17 SURGICAL SUPPLY — 52 items
BAG HAMPER (MISCELLANEOUS) ×3 IMPLANT
BANDAGE ELASTIC 3 VELCRO NS (GAUZE/BANDAGES/DRESSINGS) ×2 IMPLANT
BANDAGE ELASTIC 3 VELCRO ST LF (GAUZE/BANDAGES/DRESSINGS) IMPLANT
BANDAGE ESMARK 4X12 BL STRL LF (DISPOSABLE) ×2 IMPLANT
BIT DRILL 2 FAST STEP (BIT) ×1 IMPLANT
BIT DRILL 2.5X4 QC (BIT) ×1 IMPLANT
BLADE SURG SZ10 CARB STEEL (BLADE) ×3 IMPLANT
BNDG CMPR 12X4 ELC STRL LF (DISPOSABLE) ×2
BNDG COHESIVE 4X5 TAN NS LF (GAUZE/BANDAGES/DRESSINGS) ×3 IMPLANT
BNDG ESMARK 4X12 BLUE STRL LF (DISPOSABLE) ×3
CHLORAPREP W/TINT 26ML (MISCELLANEOUS) ×3 IMPLANT
CLOTH BEACON ORANGE TIMEOUT ST (SAFETY) ×3 IMPLANT
COVER LIGHT HANDLE STERIS (MISCELLANEOUS) ×6 IMPLANT
COVER MAYO STAND XLG (DRAPE) ×2 IMPLANT
COVER PROBE W GEL 5X96 (DRAPES) ×3 IMPLANT
CUFF TOURNIQUET SINGLE 18IN (TOURNIQUET CUFF) ×3 IMPLANT
DRAPE C-ARM FOLDED MOBILE STRL (DRAPES) ×3 IMPLANT
DRSG XEROFORM 1X8 (GAUZE/BANDAGES/DRESSINGS) ×1 IMPLANT
ELECT REM PT RETURN 9FT ADLT (ELECTROSURGICAL) ×3
ELECTRODE REM PT RTRN 9FT ADLT (ELECTROSURGICAL) ×2 IMPLANT
GLOVE BIOGEL PI IND STRL 7.0 (GLOVE) IMPLANT
GLOVE BIOGEL PI INDICATOR 7.0 (GLOVE) ×1
GLOVE EXAM NITRILE MD LF STRL (GLOVE) ×2 IMPLANT
GLOVE SKINSENSE NS SZ8.0 LF (GLOVE) ×1
GLOVE SKINSENSE STRL SZ8.0 LF (GLOVE) ×2 IMPLANT
GLOVE SS N UNI LF 8.5 STRL (GLOVE) ×3 IMPLANT
GOWN STRL REIN XL XLG (GOWN DISPOSABLE) ×12 IMPLANT
INST SET MINOR BONE (KITS) ×3 IMPLANT
KIT ROOM TURNOVER APOR (KITS) ×3 IMPLANT
MANIFOLD NEPTUNE II (INSTRUMENTS) ×3 IMPLANT
NEEDLE HYPO 21X1.5 SAFETY (NEEDLE) ×3 IMPLANT
NS IRRIG 1000ML POUR BTL (IV SOLUTION) ×3 IMPLANT
PACK BASIC LIMB (CUSTOM PROCEDURE TRAY) ×3 IMPLANT
PAD ARMBOARD 7.5X6 YLW CONV (MISCELLANEOUS) ×3 IMPLANT
PAD CAST 3X4 CTTN HI CHSV (CAST SUPPLIES) ×2 IMPLANT
PADDING CAST COTTON 3X4 STRL (CAST SUPPLIES) ×3
PEG THREADED 2.5MMX16MM LONG (Peg) ×2 IMPLANT
PEG THREADED 2.5MMX20MM LONG (Peg) ×6 IMPLANT
PLATE SHORT 21.6X48.9 NRRW LT (Plate) ×1 IMPLANT
SCREW BN 12X3.5XNS CORT TI (Screw) ×2 IMPLANT
SCREW CORT 3.5X12 (Screw) ×6 IMPLANT
SET BASIN LINEN APH (SET/KITS/TRAYS/PACK) ×3 IMPLANT
SPLINT IMMOBILIZER J 3INX20FT (CAST SUPPLIES)
SPLINT J IMMOBILIZER 3X20FT (CAST SUPPLIES) ×2 IMPLANT
SPONGE GAUZE 4X4 12PLY (GAUZE/BANDAGES/DRESSINGS) ×3 IMPLANT
STAPLER VISISTAT (STAPLE) ×3 IMPLANT
STAPLER VISISTAT 35W (STAPLE) ×1 IMPLANT
SUT ETHILON 3 0 FSL (SUTURE) ×1 IMPLANT
SUT MON AB 2-0 CT1 36 (SUTURE) ×1 IMPLANT
SYR BULB IRRIGATION 50ML (SYRINGE) ×1 IMPLANT
SYR CONTROL 10ML LL (SYRINGE) ×3 IMPLANT
WATER STERILE IRR 1000ML POUR (IV SOLUTION) ×3 IMPLANT

## 2012-06-17 NOTE — Brief Op Note (Addendum)
06/17/2012  12:01 PM  PATIENT:  Crystal Stein  41 y.o. female  PRE-OPERATIVE DIAGNOSIS:  left wrist fracture  POST-OPERATIVE DIAGNOSIS:  left wrist fracture  PROCEDURE:  Procedure(s) (LRB) with comments: OPEN REDUCTION INTERNAL FIXATION (ORIF) WRIST FRACTURE (Left) CARPAL TUNNEL RELEASE (Left)  FINDINGS: Comminuted fracture of the left distal radius with angulation and displacement. The fracture was closed and secondary to trauma, secondary to a fall.  SU RGEON:  Surgeon(s) and Role:    * Vickki Hearing, MD - Primary  PHYSICIAN ASSISTANT:   ASSISTANTS: Maceo Pro   ANESTHESIA:   general  EBL:  Total I/O In: 1000 [I.V.:1000] Out: -   BLOOD ADMINISTERED:none  DRAINS: none   LOCAL MEDICATIONS USED:  MARCAINE   , Amount: 30 ml and OTHER epi  SPECIMEN:  No Specimen  DISPOSITION OF SPECIMEN:  N/A  COUNTS:  YES  TOURNIQUET:   Total Tourniquet Time Documented: Upper Arm (Left) - 53 minutes  DICTATION: .[name] Dictation  PLAN OF CARE: Discharge to home after PACU  PATIENT DISPOSITION:  PACU - hemodynamically stable.   Delay start of Pharmacological VTE agent (>24hrs) due to surgical blood loss or risk of bleeding: not applicable

## 2012-06-17 NOTE — Transfer of Care (Signed)
  Anesthesia Post-op Note  Patient: Crystal Stein  Procedure(s) Performed: Procedure(s) (LRB) with comments: OPEN REDUCTION INTERNAL FIXATION (ORIF) WRIST FRACTURE (Left) CARPAL TUNNEL RELEASE (Left)  Patient Location: PACU  Anesthesia Type: General  Level of Consciousness: awake, alert , oriented and patient cooperative  Airway and Oxygen Therapy: Patient Spontanous Breathing and Patient connected to face mask oxygen  Post-op Pain: mild  Post-op Assessment: Post-op Vital signs reviewed, Patient's Cardiovascular Status Stable, Respiratory Function Stable, Patent Airway and No signs of Nausea or vomiting  Post-op Vital Signs: Reviewed and stable  Complications: No apparent anesthesia complications

## 2012-06-17 NOTE — Anesthesia Preprocedure Evaluation (Addendum)
Anesthesia Evaluation  Patient identified by MRN, date of birth, ID band Patient awake    Reviewed: Allergy & Precautions, H&P , NPO status , Patient's Chart, lab work & pertinent test results  History of Anesthesia Complications Negative for: history of anesthetic complications  Airway Mallampati: I  Neck ROM: Full    Dental  (+) Edentulous Upper and Edentulous Lower   Pulmonary asthma , COPD (am cough) breath sounds clear to auscultation        Cardiovascular negative cardio ROS  Rhythm:Regular Rate:Normal     Neuro/Psych PSYCHIATRIC DISORDERS Anxiety Depression Bipolar Disorder    GI/Hepatic GERD-  Medicated and Controlled,  Endo/Other    Renal/GU      Musculoskeletal   Abdominal   Peds  Hematology   Anesthesia Other Findings   Reproductive/Obstetrics                           Anesthesia Physical Anesthesia Plan  ASA: II  Anesthesia Plan: General   Post-op Pain Management:    Induction: Intravenous, Rapid sequence and Cricoid pressure planned  Airway Management Planned:   Additional Equipment:   Intra-op Plan:   Post-operative Plan: Extubation in OR  Informed Consent: I have reviewed the patients History and Physical, chart, labs and discussed the procedure including the risks, benefits and alternatives for the proposed anesthesia with the patient or authorized representative who has indicated his/her understanding and acceptance.     Plan Discussed with:   Anesthesia Plan Comments:         Anesthesia Quick Evaluation

## 2012-06-17 NOTE — Preoperative (Signed)
Beta Blockers   Reason not to administer Beta Blockers:Not Applicable 

## 2012-06-17 NOTE — Anesthesia Postprocedure Evaluation (Signed)
  Anesthesia Post-op Note  Patient: Crystal Stein  Procedure(s) Performed: Procedure(s) (LRB) with comments: OPEN REDUCTION INTERNAL FIXATION (ORIF) WRIST FRACTURE (Left) CARPAL TUNNEL RELEASE (Left)  Patient Location: PACU  Anesthesia Type: General  Level of Consciousness: awake, alert , oriented and patient cooperative  Airway and Oxygen Therapy: Patient Spontanous Breathing and Patient connected to face mask oxygen  Post-op Pain: mild  Post-op Assessment: Post-op Vital signs reviewed, Patient's Cardiovascular Status Stable, Respiratory Function Stable, Patent Airway and No signs of Nausea or vomiting  Post-op Vital Signs: Reviewed and stable  Complications: No apparent anesthesia complications  

## 2012-06-17 NOTE — H&P (Signed)
Chief Complaint   Patient presents with   .  Arm Pain       left wrist pain since injury 06/12/12   She c/o severe non radiating aching left wrist pain after fall at home and she has a closed left distal radius fracture,( distal, metaphyseal, angulated mildly displaced, comminuted).  ROS: ANXIETY, NERVOUSNESS   Past Medical History   Diagnosis  Date   .  Anxiety     .  Depression     .  Asthma     .  Edentulism, partial  10/07       full upper extraction    .  COPD (chronic obstructive pulmonary disease)     .  GERD (gastroesophageal reflux disease)     .  Bipolar disorder     .  Blood transfusion  2006     Past Surgical History   Procedure  Date   .  Tennis elbow release  04/27/06       left; Romeo Apple, [facility]   .  Multiple tooth extractions  10/07       full upper   .  Orif proximal humerus fracture  08/06       left,   .  Dilation and curettage of uterus  June 2012       failed ablation   .  Tubal ligation  June 2012       [facility]   .  Bipolar disorder     .  Hysteroscopy  01/27/2011       Procedure: HYSTEROSCOPY;  Surgeon: Tilda Burrow, MD;  Location: [facility];  Service: Gynecology;  Laterality: N/A;   .  Vaginal hysterectomy  05/19/2011       Procedure: HYSTERECTOMY VAGINAL;  Surgeon: Tilda Burrow, MD;  Location: [facility];  Service: Gynecology;  Laterality: N/A;    History       Social History   .  Marital Status:  Married       Spouse Name:  N/A       Number of Children:  N/A   .  Years of Education:  N/A      Occupational History   .  Not on file.     Social History Main Topics   .  Smoking status:  Current Every Day Smoker -- 1.0 packs/day for 22 years       Types:  Cigarettes   .  Smokeless tobacco:  Not on file   .  Alcohol Use:  No   .  Drug Use:  No   .  Sexually Active:      Family History   Problem  Relation  Age of Onset   .  Anesthesia problems  Neg Hx     .  Hypotension  Neg Hx     .  Malignant hyperthermia  Neg Hx     .  Pseudochol  deficiency  Neg Hx     .  Diabetes         PHYSICAL EXAM  BP 100/70  Ht 5' 4.5" (1.638 m)  Wt 150 lb (68.04 kg)  BMI 25.35 kg/m2  LMP 04/23/2011 Physical Exam  Nursing note and vitals reviewed. Constitutional: She is oriented to person, place, and time. She appears well-developed and well-nourished.       Thin, small  HENT:   Head: Normocephalic and atraumatic.  Eyes: Pupils are equal, round, and reactive to light.  Neck: Normal range of motion. Neck supple.  No JVD present. No tracheal deviation present. No thyromegaly present.  Cardiovascular: Normal rate and intact distal pulses.   Respiratory: Effort normal.  GI: Soft.  Lymphadenopathy:    She has no cervical adenopathy.  Neurological: She is alert and oriented to person, place, and time. She has normal reflexes.  Skin: Skin is warm and dry. No rash noted. No erythema. No pallor.  Psychiatric: She has a normal mood and affect. Her behavior is normal. Judgment and thought content normal.    Ortho Exam Lower extremity exam  Ambulation is normal.  The right and left lower extremity:  Inspection and palpation revealed no tenderness or abnormality in alignment in the lower extremities. Range of motion is full.  Strength is grade 5.  All joints are stable.  Upper extremity exam  The right upper extremity:    Inspection and palpation revealed no abnormalities in the upper extremities.   Range of motion is full without contracture. Motor exam is normal with grade 5 strength. The joints are fully reduced without subluxation. There is no atrophy or tremor and muscle tone is normal.  All joints are stable.  LEFT upper extremity is in a splint. Fingers are very pink, painful range of motion. Further examination deferred. Skin is intact.   X-ray show dorsally angulated, shortened distal radius fracture LEFT wrist.   The patient will require open reduction, internal fixation. Risk benefit ratio, explained to the  patient.   Patient now for surgical treatment.   Recommend volar plating, LEFT wrist for LEFT distal radius fracture, closed, metaphyseal, diaphyseal area, mild comminution with dorsal tilt and apex volar angulation

## 2012-06-17 NOTE — Anesthesia Procedure Notes (Signed)
Procedure Name: Intubation Date/Time: 06/17/2012 10:44 AM Performed by: Carolyne Littles, Cardelia Sassano L Pre-anesthesia Checklist: Patient identified, Patient being monitored, Timeout performed, Emergency Drugs available and Suction available Patient Re-evaluated:Patient Re-evaluated prior to inductionOxygen Delivery Method: Circle System Utilized Preoxygenation: Pre-oxygenation with 100% oxygen Intubation Type: IV induction, Rapid sequence and Cricoid Pressure applied Laryngoscope Size: 3 and Miller Grade View: Grade I Tube type: Oral Tube size: 7.0 mm Number of attempts: 1 Airway Equipment and Method: stylet Placement Confirmation: ETT inserted through vocal cords under direct vision,  positive ETCO2 and breath sounds checked- equal and bilateral Secured at: 21 cm Tube secured with: Tape Dental Injury: Teeth and Oropharynx as per pre-operative assessment

## 2012-06-17 NOTE — Op Note (Signed)
06/17/2012  12:01 PM  PATIENT:  Crystal Stein  41 y.o. female  PRE-OPERATIVE DIAGNOSIS:  left wrist fracture  POST-OPERATIVE DIAGNOSIS:  left wrist fracture  PROCEDURE:  Procedure(s) (LRB) with comments: OPEN REDUCTION INTERNAL FIXATION (ORIF) WRIST FRACTURE (Left) CARPAL TUNNEL RELEASE (Left)  FINDINGS: Comminuted fracture of the left distal radius with angulation and displacement. The fracture was closed and secondary to trauma, secondary to a fall.  SU RGEON:  Surgeon(s) and Role:    * Vickki Hearing, MD - Primary  PHYSICIAN ASSISTANT:   ASSISTANTS: Maceo Pro   ANESTHESIA:   general  EBL:  Total I/O In: 1000 [I.V.:1000] Out: -   BLOOD ADMINISTERED:none  DRAINS: none   LOCAL MEDICATIONS USED:  MARCAINE   , Amount: 30 ml and OTHER epi  SPECIMEN:  No Specimen  DISPOSITION OF SPECIMEN:  N/A  COUNTS:  YES  TOURNIQUET:   Total Tourniquet Time Documented: Upper Arm (Left) - 53 minutes  DICTATION: .[name] Dictation  PLAN OF CARE: Discharge to home after PACU  PATIENT DISPOSITION:  PACU - hemodynamically stable.   Delay start of Pharmacological VTE agent (>24hrs) due to surgical blood loss or risk of bleeding: not applicable  Details of procedure. The patient was seen in the preoperative area and the surgical site was confirmed and marked. Proper chart update was completed. Patient was taken to the operating room and given weight appropriate dose of IV Ancef. After general anesthesia the left upper extremity was prepped and draped sterilely.  A midline incision was made over the carpal tunnel extended across the wrist into the distal forearm subcutaneous tissue was divided. The median nerve was identified after carpal tunnel release and retracted and protected.  Blunt dissection was carried down to the fracture site. The pronator quadratus was incised and retracted with a periosteal elevator. The fracture was then manipulated into place and a DVR narrow  plate was placed using 5 takes distally and 2 screws proximally.  The first peg was placed in and was marginal in terms of its position related to the joint lines of the plate was move proximally under the pedicle was drilled and then sequential pedicles were placed. The angular screw from the radial styloid was not placed in that peg guide was removed. The final cortical screw was placed. Final x-rays showed fracture reduction with restoration of length and radial a quantitation angle as well as articular surface angle.  The wound is irrigated and closed with 2-0 nylon and staples. We used 30 cc of Marcaine with epinephrine and the wound edges for assistance with postoperative anesthesia  After sterile dressing and volar splint was placed with the wrist at 20 extension  The patient be discharged home

## 2012-06-17 NOTE — Interval H&P Note (Signed)
History and Physical Interval Note:  06/17/2012 9:11 AM  Crystal Stein  has presented today for surgery, with the diagnosis of left wrist fracture  The various methods of treatment have been discussed with the patient and family. After consideration of risks, benefits and other options for treatment, the patient has consented to  Procedure(s) (LRB) with comments: OPEN REDUCTION INTERNAL FIXATION (ORIF) WRIST FRACTURE (Left) as a surgical intervention .  The patient's history has been reviewed, patient examined, no change in status, stable for surgery.  I have reviewed the patient's chart and labs.  Questions were answered to the patient's satisfaction.     Fuller Canada

## 2012-06-20 ENCOUNTER — Encounter (HOSPITAL_COMMUNITY): Payer: Self-pay | Admitting: Orthopedic Surgery

## 2012-06-20 ENCOUNTER — Ambulatory Visit (INDEPENDENT_AMBULATORY_CARE_PROVIDER_SITE_OTHER): Payer: Medicaid Other | Admitting: Orthopedic Surgery

## 2012-06-20 VITALS — BP 90/60 | Ht 64.5 in | Wt 150.0 lb

## 2012-06-20 DIAGNOSIS — S52509A Unspecified fracture of the lower end of unspecified radius, initial encounter for closed fracture: Secondary | ICD-10-CM

## 2012-06-20 DIAGNOSIS — S52599A Other fractures of lower end of unspecified radius, initial encounter for closed fracture: Secondary | ICD-10-CM

## 2012-06-20 NOTE — Patient Instructions (Signed)
START FINGER EXERCISES AND KEEP HAND ELEVATED

## 2012-06-20 NOTE — Progress Notes (Signed)
Patient ID: Crystal Stein, female   DOB: 11-04-1970, 41 y.o.   MRN: [id number] Chief Complaint  Patient presents with  . Follow-up    post op #1 left wrist fracture from injury 06/12/12 DOS 06/17/12   Current Outpatient Prescriptions on File Prior to Visit  Medication Sig Dispense Refill  . alprazolam (XANAX) 2 MG tablet Take 2 mg by mouth 3 (three) times daily.      Marland Kitchen omeprazole (PRILOSEC) 20 MG capsule Take 20 mg by mouth daily.        Marland Kitchen PARoxetine (PAXIL-CR) 12.5 MG 24 hr tablet Take 12.5 mg by mouth daily. Takes every morning along with a 37.5mg  tablet      . PARoxetine (PAXIL-CR) 37.5 MG 24 hr tablet Take 75 mg by mouth 2 (two) times daily.       Marland Kitchen tiZANidine (ZANAFLEX) 4 MG capsule Take 4 mg by mouth 3 (three) times daily.      . ziprasidone (GEODON) 40 MG capsule Take 40 mg by mouth daily.        1. Radius distal fracture     And internal fixation, LEFT distal radius fracture with DVR plate. 3 carpal tunnel approach with carpal tunnel release.  Dressing change and splint reapplication.  The bone looks clean except for some sanguinous drainage. Her fingers are very much swollen and stiff.  We reviewed passive finger extension exercises.  Return for staple removal and x-ray and re\re splinting

## 2012-06-30 ENCOUNTER — Telehealth: Payer: Self-pay | Admitting: *Deleted

## 2012-06-30 ENCOUNTER — Ambulatory Visit (INDEPENDENT_AMBULATORY_CARE_PROVIDER_SITE_OTHER): Payer: Medicaid Other | Admitting: Orthopedic Surgery

## 2012-06-30 ENCOUNTER — Ambulatory Visit (INDEPENDENT_AMBULATORY_CARE_PROVIDER_SITE_OTHER): Payer: Medicaid Other

## 2012-06-30 VITALS — BP 94/60 | Ht 64.5 in | Wt 150.0 lb

## 2012-06-30 DIAGNOSIS — S52509A Unspecified fracture of the lower end of unspecified radius, initial encounter for closed fracture: Secondary | ICD-10-CM

## 2012-06-30 DIAGNOSIS — S52599A Other fractures of lower end of unspecified radius, initial encounter for closed fracture: Secondary | ICD-10-CM

## 2012-06-30 DIAGNOSIS — S62109A Fracture of unspecified carpal bone, unspecified wrist, initial encounter for closed fracture: Secondary | ICD-10-CM

## 2012-06-30 NOTE — Patient Instructions (Addendum)
Splint   Elevate the hand more, elevate elevate elevate !  Start OT   You have to move your fingers ! Straighten them out 10 x a day

## 2012-06-30 NOTE — Telephone Encounter (Signed)
Patient called and wanted to know when she would be able to start driving?

## 2012-06-30 NOTE — Telephone Encounter (Signed)
When she can hold the wheel

## 2012-07-01 ENCOUNTER — Telehealth: Payer: Self-pay | Admitting: *Deleted

## 2012-07-01 ENCOUNTER — Ambulatory Visit (HOSPITAL_COMMUNITY)
Admission: RE | Admit: 2012-07-01 | Discharge: 2012-07-01 | Disposition: A | Discharge status: Home / Self Care | Payer: Medicaid Other | Source: Ambulatory Visit | Attending: Orthopedic Surgery | Admitting: Orthopedic Surgery

## 2012-07-01 DIAGNOSIS — S52509A Unspecified fracture of the lower end of unspecified radius, initial encounter for closed fracture: Secondary | ICD-10-CM

## 2012-07-01 DIAGNOSIS — M6281 Muscle weakness (generalized): Secondary | ICD-10-CM | POA: Insufficient documentation

## 2012-07-01 DIAGNOSIS — M25532 Pain in left wrist: Secondary | ICD-10-CM | POA: Insufficient documentation

## 2012-07-01 DIAGNOSIS — IMO0001 Reserved for inherently not codable concepts without codable children: Secondary | ICD-10-CM | POA: Insufficient documentation

## 2012-07-01 DIAGNOSIS — M25639 Stiffness of unspecified wrist, not elsewhere classified: Secondary | ICD-10-CM | POA: Insufficient documentation

## 2012-07-01 DIAGNOSIS — M25539 Pain in unspecified wrist: Secondary | ICD-10-CM | POA: Insufficient documentation

## 2012-07-01 DIAGNOSIS — T8140XA Infection following a procedure, unspecified, initial encounter: Secondary | ICD-10-CM | POA: Insufficient documentation

## 2012-07-01 DIAGNOSIS — Y838 Other surgical procedures as the cause of abnormal reaction of the patient, or of later complication, without mention of misadventure at the time of the procedure: Secondary | ICD-10-CM | POA: Insufficient documentation

## 2012-07-01 DIAGNOSIS — R600 Localized edema: Secondary | ICD-10-CM | POA: Insufficient documentation

## 2012-07-01 NOTE — Telephone Encounter (Signed)
[name] from occupational therapy had a question regarding a splint for Crystal Stein DOB [birth year]. Said she was going to overhead page you but wanted [place] to put a note up to make sure you spoke with her.

## 2012-07-01 NOTE — Telephone Encounter (Signed)
Called patient and informed her she could start driving when she was able to hold the wheel with both hands.

## 2012-07-01 NOTE — Evaluation (Signed)
Occupational Therapy Evaluation  Patient Details  Name: Crystal Stein MRN: [id number] Date of Birth: September 17, 1970  Today's Date: 07/01/2012 Time: 4098-1191 OT Time Calculation (min): 38 min OT Evaluation 38' Visit#: 1  of 18   Re-eval: 07/29/12   Diagnosis: S/P Left Distal Radius Fracture with ORIF Surgical Date: 06/17/12 Next MD Visit: 07/07/12 Prior Therapy: not for this diagnosis  Authorization: Medicaid requested 3 visits  Authorization Time Period: 3 visits  Authorization Visit#: 0  of 3    Past Medical History:  Past Medical History  Diagnosis Date  . Anxiety   . Depression   . Asthma   . 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 COPD (chronic obstructive pulmonary disease)   . GERD (gastroesophageal reflux disease)   . Bipolar disorder   . Blood transfusion 2006   Past Surgical History:  Past Surgical History  Procedure Date  . Tennis elbow release 04/27/06    left; Romeo Apple, [facility]  . Multiple tooth extractions 10/07    full upper  . Orif proximal humerus fracture 08/06    left,  . Dilation and curettage of uterus June 2012    failed ablation  . Tubal ligation June 2012    [facility]  . Bipolar disorder   . Hysteroscopy 01/27/2011    Procedure: HYSTEROSCOPY;  Surgeon: Tilda Burrow, MD;  Location: [facility];  Service: Gynecology;  Laterality: N/A;  . Vaginal hysterectomy 05/19/2011    Procedure: HYSTERECTOMY VAGINAL;  Surgeon: Tilda Burrow, MD;  Location: [facility];  Service: Gynecology;  Laterality: N/A;  . Orif wrist fracture 06/17/2012    Procedure: OPEN REDUCTION INTERNAL FIXATION (ORIF) WRIST FRACTURE;  Surgeon: Vickki Hearing, MD;  Location: [facility];  Service: Orthopedics;  Laterality: Left;  . Carpal tunnel release 06/17/2012    Procedure: CARPAL TUNNEL RELEASE;  Surgeon: Vickki Hearing, MD;  Location: [facility];  Service: Orthopedics;  Laterality: Left;    Subjective Symptoms/Limitations Symptoms: S:  I fell leaving church and  landed on my left arm.  I broke my wrist.  The MD wants me to move it more.  Pertinent History: Crystal Stein states that she tripped and fell, suffering a left distal radius fracture on 06/12/12.  She went to the ER and followed up with Dr. Romeo Apple.  She had surgery on 06/17/12 to repair the fracture.  She has been referred to occupational therapy for AA/PROM of her digits and for edema control.   Limitations: AA/PROM of digits into flexion and extension.  Patient must come to therapy 3 times a week, Call MD if she does not attend therapy. Patient Stated Goals: "I want to be able to straighten my fingers out." Pain Assessment Currently in Pain?: Yes Pain Score: 10-Worst pain ever Pain Location: Hand Pain Orientation: Left Pain Type: Acute pain  Precautions/Restrictions  Precautions Precautions:  (AA/PROM of digits only)  Prior Functioning  Home Living Lives With: Family Prior Function Level of Independence: Independent with basic ADLs;Independent with homemaking with ambulation Driving: Yes Vocation: Unemployed Leisure: Hobbies-yes (Comment) Comments: spending time with her family  Assessment ADL/Vision/Perception ADL ADL Comments: She is not able to use her LUE as an active assist with daily activities.  She is not able to straighten her left index and long fingers.  Dominant Hand: Right  Cognition/Observation Cognition Orientation Level: Oriented X4  Sensation/Coordination/Edema Sensation Light Touch: Appears Intact Coordination Gross Motor Movements are Fluid and Coordinated: Yes Fine Motor Movements are Fluid and Coordinated:  No (Deferred Nine Hole Peg Test) Edema Edema: assessed at MCPJ right 17.0 cm left 18.8 cm and at IPJ of thumb right 5.7 cm left 5.7 cm  Additional Assessments LUE AROM (degrees) Left Composite Finger Extension:  (t 0/0, i +26/+40/+12, l +24/+50/0, r +34/+24/0, s 0/0/0) Left Composite Finger Flexion:  (t 18/30, i 30/70/32, l 50/72/22, r  50/80/28, s 30/72/40) Palpation Palpation: max edema, flexor tendons of thumb, index, and long digits are very tight     Exercise/Treatments    Manual Therapy Manual Therapy: Massage Edema Management: Retograde massage and elevation of left hand Massage: Massage and PROM/joint blocking at each digit and each joint to increase mobility and decrease edema in left hand.  Splinting Splinting: Fabricate left wrist extension splint at next visit.  Fabricate gutter splint for IF and LF for sleeping.  Occupational Therapy Assessment and Plan OT Assessment and Plan Clinical Impression Statement: A:  42 year old patient presents with increased pain, edema, fascial restrictions and decreased A/PROM of left hand digits and functional use of LUE s/p left wrist fracture.  Skilled OT intervention is indicated to increase functional use of left hand and decrease pain, edema, fascial restriction, and tendon restrictions. Pt will benefit from skilled therapeutic intervention in order to improve on the following deficits: Decreased range of motion;Decreased coordination;Decreased skin integrity;Increased fascial restricitons;Increased muscle spasms;Decreased strength;Impaired UE functional use;Pain Rehab Potential: Good OT Frequency: Min 3X/week OT Duration: 6 weeks OT Treatment/Interventions: Self-care/ADL training;Therapeutic exercise;Therapeutic activities;Splinting;Manual therapy;Patient/family education OT Plan: P:  Skilled OT intervention to increase A/PROM, functional use, coordination of left hand and decrease pain, fascial restrictions, edema.  Treatment Plan:  Massage, edema reduction, AA/PROM of left hand digits.  Functional tasks with left hand.  Fabricated wrist splint and IF and LF gutter splint at next treatment session.    Goals Short Term Goals Time to Complete Short Term Goals: 4 weeks Short Term Goal 1: Patient will be educated on HEP for left hand AAROM and edema control. Short Term Goal  2: Patient will decrease edema in her left hand by .75 cm for increased ability to move her hand during functional activities.   Short Term Goal 3: Fabricate and educate patient on use of left wrist extension and IF and LF gutter splints.   Short Term Goal 4: Patient will increase PROM of digit flexion and extension in left hand to [facility] for increased ability to use left hand as an active assist during functional activities.  Long Term Goals Time to Complete Long Term Goals: 8 weeks Long Term Goal 1: Patient will use left hand actively with all daily activities.  Long Term Goal 2: Patient will decrease edema in her left hand by 2 cm for increased mobility in left hand during functional activtiies. Long Term Goal 3: Patient will increase AAROM in her left hand to [facility] for use of left hand with grasping items when completing household chores.   Long Term Goal 4: Assess left wrist AROM, strength, grip strength, pinch strength, and coordination when indicated by MD.  Problem List Patient Active Problem List  Diagnosis  . ANEMIA  . BIPOLAR DISORDER UNSPECIFIED  . ANXIETY, CHRONIC  . NICOTINE ADDICTION  . CUBITAL TUNNEL SYNDROME  . ACUTE SINUSITIS, UNSPECIFIED  . ACUTE BRONCHITIS  . Pain in Joint, Upper Arm  . NECK PAIN, CHRONIC  . LATERAL EPICONDYLITIS  . DIARRHEA NOS  . RUQ PAIN  . ABDOMINAL PAIN, GENERALIZED  . ABDOMINAL PAIN, LOWER  . ABNORMAL TRANSAMINASE, (LFT'S)  .  OTHER ABNORMAL BLOOD CHEMISTRY  . CLOSED FRACTURE OF SURGICAL NECK OF HUMERUS  . LIVER FUNCTION TESTS, ABNORMAL, HX OF  . Ankle sprain  . Radius distal fracture  . Wrist pain, left  . Edema of hand    End of Session Activity Tolerance: Patient tolerated treatment well General Behavior During Session: [facility] for tasks performed Cognition: [facility] for tasks performed OT Plan of Care OT Home Exercise Plan: edema management, tendon glildes, joint blocking Consulted and Agree with Plan of Care: Patient  GO    Shirlean Mylar, OTR/L  07/01/2012, 1:25 PM  Physician Documentation Your signature is required to indicate approval of the treatment plan as stated above.  Please sign and either send electronically or make a copy of this report for your files and return this physician signed original.  Please mark one 1.__approve of plan  2. ___approve of plan with the following conditions.   ______________________________                                                          _____________________ Physician Signature                                                                                                             Date

## 2012-07-04 ENCOUNTER — Ambulatory Visit (HOSPITAL_COMMUNITY)
Admission: RE | Admit: 2012-07-04 | Discharge: 2012-07-04 | Disposition: A | Discharge status: Home / Self Care | Payer: Medicaid Other | Source: Ambulatory Visit | Attending: Orthopedic Surgery | Admitting: Orthopedic Surgery

## 2012-07-04 DIAGNOSIS — M25532 Pain in left wrist: Secondary | ICD-10-CM

## 2012-07-04 DIAGNOSIS — R600 Localized edema: Secondary | ICD-10-CM

## 2012-07-04 NOTE — Progress Notes (Signed)
Occupational Therapy Treatment Patient Details  Name: Crystal Stein MRN: [id number] Date of Birth: February 08, 1971  Today's Date: 07/04/2012 Time: 1340-1420 OT Time Calculation (min): 40 min Splinting 1340-1400 20' Manual Therapy 1400-1410 10' Therapeutic Exercises 1410-1420 10' Visit#: 2  of 18   Re-eval: 07/29/12    Authorization: Medicaid requested 3 visits   Authorization Time Period: 3 visits   Authorization Visit#: 1  of 3   Subjective  S:  I did my exercises this weekend.  They hurt, but I did them.   Limitations: AA/PROM of digits into flexion and extension. Patient must come to therapy 3 times a week, Call MD if she does not attend therapy.  Pain Assessment Currently in Pain?: Yes Pain Score:   4 Pain Location: Hand Pain Orientation: Left Pain Type: Acute pain  Precautions/Restrictions   AA/PROM of digits into flexion and extension. Patient must come to therapy 3 times a week, Call MD if she does not attend therapy.   Exercise/Treatments Hand Exercises MCPJ Flexion: PROM;AAROM;5 reps MCPJ Extension: PROM;AAROM;5 reps PIPJ Flexion: PROM;AAROM;5 reps PIPJ Extension: PROM;AAROM;5 reps DIPJ Flexion: PROM;AAROM;5 reps DIPJ Extension: PROM;AAROM;5 reps Joint Blocking Exercises: A/PROM at each joint x 5 Thumb Opposition: to ring finger x 5 reps each digit Digit Abduction/Adduction: AROM x 10 Sponges: picked up 5, 6, 6 Tendon Glides: x 10 reps     Manual Therapy Edema Management: Retograde massage and elevation of left hand  Massage: Massage and PROM/joint blocking at each digit and each joint to increase mobility and decrease edema in left hand.  Splinting Splinting: Fabricated left dorsal based wrist splint with wrist extended approximately 20 degrees, as half cast was positioned.  Splint allows for full use of all digits and thumb.  Patient was educated to wear splint at all times, removing for hygiene purposes only.  Patient was educated on proper donning and  doffing technique as well as contraindications  Patient was educated to wear splint over top of edema glove that was also issued this date.  Did not fabricate gutter splints for IF and LF, as patient now has WFL extension at MCPJ with 0 tendon tightness noted.    Occupational Therapy Assessment and Plan OT Assessment and Plan Clinical Impression Statement: A:  Patient presents with significant decrease in edema this date, increased PROM of digits to [facility], and increased AAROM to 90%.  Patient had significant decrease in pain level this date, as well.    Patient demonstrated I with donning and doffing splint this date. OT Plan: P:  Increase AAROM to WNL, decrease edema in digits and hand by .5 cm for increased mobility and decreased pain.  Follow up on compliance with use of splint.   Goals Short Term Goals Time to Complete Short Term Goals: 4 weeks Short Term Goal 1: Patient will be educated on HEP for left hand AAROM and edema control. Short Term Goal 1 Progress: Progressing toward goal Short Term Goal 2: Patient will decrease edema in her left hand by .75 cm for increased ability to move her hand during functional activities.   Short Term Goal 2 Progress: Progressing toward goal Short Term Goal 3: Fabricate and educate patient on use of left wrist extension and IF and LF gutter splints.   Short Term Goal 3 Progress: Progressing toward goal Short Term Goal 4: Patient will increase PROM of digit flexion and extension in left hand to [facility] for increased ability to use left hand as an active assist during functional activities.  Short Term Goal 4 Progress: Progressing toward goal Long Term Goals Time to Complete Long Term Goals: 8 weeks Long Term Goal 1: Patient will use left hand actively with all daily activities.  Long Term Goal 1 Progress: Progressing toward goal Long Term Goal 2: Patient will decrease edema in her left hand by 2 cm for increased mobility in left hand during functional  activtiies. Long Term Goal 2 Progress: Progressing toward goal Long Term Goal 3: Patient will increase AAROM in her left hand to [facility] for use of left hand with grasping items when completing household chores.   Long Term Goal 3 Progress: Progressing toward goal Long Term Goal 4: Assess left wrist AROM, strength, grip strength, pinch strength, and coordination when indicated by MD. Long Term Goal 4 Progress: Progressing toward goal  Problem List Patient Active Problem List  Diagnosis  . ANEMIA  . BIPOLAR DISORDER UNSPECIFIED  . ANXIETY, CHRONIC  . NICOTINE ADDICTION  . CUBITAL TUNNEL SYNDROME  . ACUTE SINUSITIS, UNSPECIFIED  . ACUTE BRONCHITIS  . Pain in Joint, Upper Arm  . NECK PAIN, CHRONIC  . LATERAL EPICONDYLITIS  . DIARRHEA NOS  . RUQ PAIN  . ABDOMINAL PAIN, GENERALIZED  . ABDOMINAL PAIN, LOWER  . ABNORMAL TRANSAMINASE, (LFT'S)  . OTHER ABNORMAL BLOOD CHEMISTRY  . CLOSED FRACTURE OF SURGICAL NECK OF HUMERUS  . LIVER FUNCTION TESTS, ABNORMAL, HX OF  . Ankle sprain  . Radius distal fracture  . Wrist pain, left  . Edema of hand    End of Session Activity Tolerance: Patient tolerated treatment well General Behavior During Session: [facility] for tasks performed Cognition: [facility] for tasks performed  GO    Shirlean Mylar, OTR/L  07/04/2012, 3:56 PM

## 2012-07-06 ENCOUNTER — Ambulatory Visit (HOSPITAL_COMMUNITY)
Admission: RE | Admit: 2012-07-06 | Discharge: 2012-07-06 | Disposition: A | Discharge status: Home / Self Care | Payer: Medicaid Other | Source: Ambulatory Visit | Attending: Orthopedic Surgery | Admitting: Orthopedic Surgery

## 2012-07-06 DIAGNOSIS — R600 Localized edema: Secondary | ICD-10-CM

## 2012-07-06 DIAGNOSIS — M25532 Pain in left wrist: Secondary | ICD-10-CM

## 2012-07-06 NOTE — Progress Notes (Signed)
Occupational Therapy Treatment Patient Details  Name: SHELBIE FRANKEN MRN: [id number] Date of Birth: 13-Apr-1971  Today's Date: 07/06/2012 Time: 4403-4742 OT Time Calculation (min): 40 min Massage 800-815 15' Therex 595-638 25"  Visit#: 3  of 18   Re-eval: 07/29/12    Authorization: Medicaid requested 3 visits  Authorization Time Period: 3 visits  Authorization Visit#:   of    Subjective Symptoms/Limitations Symptoms: S: My fingers feel so weird.  Pain Assessment Currently in Pain?: No/denies  Precautions/Restrictions  Precautions Precautions:  (AA/PROM of digits only)  Exercise/Treatments Hand Exercises MCPJ Flexion: PROM;AAROM;5 reps MCPJ Extension: PROM;AAROM;5 reps PIPJ Flexion: PROM;AAROM;5 reps PIPJ Extension: PROM;AAROM;5 reps DIPJ Flexion: PROM;AAROM;5 reps DIPJ Extension: PROM;AAROM;5 reps Joint Blocking Exercises: A/PROM at each joint x 5 Theraputty: Roll;Grip (very gentle) Thumb Opposition: to ring finger x 5 reps each digit Digit Abduction/Adduction: AROM x 10 Sponges: picked up 5, 6, 6 Tendon Glides: x 10 reps     Manual Therapy Manual Therapy: Edema management Edema Management: Retograde massage and elevation of left hand  Massage: Massage and PROM/joint blocking at each digit and each joint to increase mobility and decrease edema in left hand.   Occupational Therapy Assessment and Plan OT Assessment and Plan Clinical Impression Statement: A: No pain this visit. Decreased edema this date. Patient did not wear splint yesterday due to velcro anchor coming off. Velcro has been reapplied this date. OT Plan: P: Increase AAROM to WNL, decrease edema in digits and hand by .5 cm for increased mobility and decreased pain.    Goals Short Term Goals Time to Complete Short Term Goals: 4 weeks Short Term Goal 1: Patient will be educated on HEP for left hand AAROM and edema control. Short Term Goal 1 Progress: Met Short Term Goal 2: Patient will decrease  edema in her left hand by .75 cm for increased ability to move her hand during functional activities.   Short Term Goal 2 Progress: Progressing toward goal Short Term Goal 3: Fabricate and educate patient on use of left wrist extension and IF and LF gutter splints.   Short Term Goal 3 Progress: Progressing toward goal Short Term Goal 4: Patient will increase PROM of digit flexion and extension in left hand to [facility] for increased ability to use left hand as an active assist during functional activities.  Short Term Goal 4 Progress: Progressing toward goal Long Term Goals Time to Complete Long Term Goals: 8 weeks Long Term Goal 1: Patient will use left hand actively with all daily activities.  Long Term Goal 1 Progress: Progressing toward goal Long Term Goal 2: Patient will decrease edema in her left hand by 2 cm for increased mobility in left hand during functional activtiies. Long Term Goal 2 Progress: Progressing toward goal Long Term Goal 3: Patient will increase AAROM in her left hand to [facility] for use of left hand with grasping items when completing household chores.   Long Term Goal 3 Progress: Progressing toward goal Long Term Goal 4: Assess left wrist AROM, strength, grip strength, pinch strength, and coordination when indicated by MD. Long Term Goal 4 Progress: Progressing toward goal  Problem List Patient Active Problem List  Diagnosis  . ANEMIA  . BIPOLAR DISORDER UNSPECIFIED  . ANXIETY, CHRONIC  . NICOTINE ADDICTION  . CUBITAL TUNNEL SYNDROME  . ACUTE SINUSITIS, UNSPECIFIED  . ACUTE BRONCHITIS  . Pain in Joint, Upper Arm  . NECK PAIN, CHRONIC  . LATERAL EPICONDYLITIS  . DIARRHEA NOS  . RUQ PAIN  .  ABDOMINAL PAIN, GENERALIZED  . ABDOMINAL PAIN, LOWER  . ABNORMAL TRANSAMINASE, (LFT'S)  . OTHER ABNORMAL BLOOD CHEMISTRY  . CLOSED FRACTURE OF SURGICAL NECK OF HUMERUS  . LIVER FUNCTION TESTS, ABNORMAL, HX OF  . Ankle sprain  . Radius distal fracture  . Wrist pain, left  .  Edema of hand    End of Session Activity Tolerance: Patient tolerated treatment well General Behavior During Session: [facility] for tasks performed Cognition: [facility] for tasks performed   Limmie Patricia, OTR/L 07/06/2012, 8:46 AM

## 2012-07-07 ENCOUNTER — Ambulatory Visit (INDEPENDENT_AMBULATORY_CARE_PROVIDER_SITE_OTHER): Payer: Medicaid Other | Admitting: Orthopedic Surgery

## 2012-07-07 VITALS — Ht 64.5 in | Wt 150.0 lb

## 2012-07-07 DIAGNOSIS — T8149XA Infection following a procedure, other surgical site, initial encounter: Secondary | ICD-10-CM

## 2012-07-07 DIAGNOSIS — T8140XA Infection following a procedure, unspecified, initial encounter: Secondary | ICD-10-CM

## 2012-07-07 MED ORDER — CEPHALEXIN 500 MG PO CAPS: 500.0000 mg | ORAL_CAPSULE | Freq: Two times a day (BID) | ORAL | Status: DC

## 2012-07-07 NOTE — Progress Notes (Signed)
Patient ID: Crystal Stein, female   DOB: [birth year], 42 y.o.   MRN: [id number]  Chief Complaint  Patient presents with  . Follow-up    1 week recheck on left wrist wound. DOS 06-17-12.   Patient volar plating via carpal tunnel approach, she's developed wound infection with eschar forming over the volar aspect of the carpal tunnel. She is improved. Her range of motion significantly and is continued in a, brace. We'll start wet-to-dry dressing changes and Keflex. Come back one week for wound check

## 2012-07-07 NOTE — Patient Instructions (Signed)
Start keflex 500 mg twice a day

## 2012-07-08 ENCOUNTER — Encounter: Payer: Self-pay | Admitting: Orthopedic Surgery

## 2012-07-08 ENCOUNTER — Ambulatory Visit (HOSPITAL_COMMUNITY)
Admission: RE | Admit: 2012-07-08 | Discharge: 2012-07-08 | Disposition: A | Discharge status: Home / Self Care | Payer: Medicaid Other | Source: Ambulatory Visit | Attending: Orthopedic Surgery | Admitting: Orthopedic Surgery

## 2012-07-08 DIAGNOSIS — R600 Localized edema: Secondary | ICD-10-CM

## 2012-07-08 DIAGNOSIS — M25532 Pain in left wrist: Secondary | ICD-10-CM

## 2012-07-08 NOTE — Progress Notes (Signed)
Occupational Therapy Treatment Patient Details  Name: TAMBRA MULLER MRN: [id number] Date of Birth: August 27, 1970  Today's Date: 07/08/2012 Time: 1019-1100 OT Time Calculation (min): 41 min Wound care 1019-1050 31' Therapeutic Exercises 1050-1100 10' Visit#: 4  of 18   Re-eval: 07/29/12    Authorization: Medicaid 3 visits - Patient signed responsibility of payment form this date, as she has utilized the 3 visits authorized by [employer].  Authorization Time Period: 3 visits  Authorization Visit#: 3  of 3   Subjective  S:  I went to the MD, and he removed the staples.  I have an infection, and he wants me to have every other day dressing changes.   Pertinent History: Reviewed MD note from 07/07/12, ordered every other day dressing changes with wet to dry dressing.   Limitations: AA/PROM of digits into flexion and extension. Patient must come to therapy 3 times a week, Call MD if she does not attend therapy.  Pain Assessment Currently in Pain?: Yes Pain Score:   5 Pain Location: Wrist Pain Orientation: Left Pain Type: Acute pain  Precautions/Restrictions   AA/PROM of digits into flexion and extension. Patient must come to therapy 3 times a week, Call MD if she does not attend therapy.    Exercise/Treatments Hand Exercises MCPJ Flexion: PROM;AROM;5 reps MCPJ Extension: PROM;AROM;5 reps PIPJ Flexion: PROM;AROM;5 reps PIPJ Extension: PROM;AROM;5 reps DIPJ Flexion: PROM;AROM;5 reps DIPJ Extension: PROM;AROM;5 reps Joint Blocking Exercises: A/PROM at each joint x 5 Thumb Opposition: to small finger PIPJ x 5 reps Digit Abduction/Adduction: AROM x 10     Manual Therapy Manual Therapy: Massage Edema Management: Use of edema glove.   Massage: Massage and PROM/joint blocking at each digit and each joint to increase mobility and decrease edema in left hand. Woundcare:  Assessed wound:  wound is on left volar wrist over carpal tunnel.  Wound size is 4 cm in length, 1 cm in width, and  .5 cm in depth.  Wound has moderate yellow slough.  OTR/L debrided wound and surrounding area, removing dead/dry skin, loosened yellow slough with scalpel, was not able to remove any slough this date.  Applied medihoney gel to wound bed, vaseline to periarea of wound and entire surgical scar.  Placed dry 2x2 over wound and wrapped with guaze.  Educated patient on contraidications, precautions of wound and dressing.    Occupational Therapy Assessment and Plan OT Assessment and Plan Clinical Impression Statement: A:  Evaluated wound and initiated wound care this date to left volar wrist wound per MD order.  Patient wilth minimal to trace edema and full PROM of digits this date.  OT Plan: P:  Continue wound care, decreasing wound size by .25 cm squared or better.  Improve AROM of digits to WNL.    Goals Short Term Goals Time to Complete Short Term Goals: 4 weeks Short Term Goal 1: Patient will be educated on HEP for left hand AAROM and edema control. Short Term Goal 2: Patient will decrease edema in her left hand by .75 cm for increased ability to move her hand during functional activities.   Short Term Goal 3: Fabricate and educate patient on use of left wrist extension and IF and LF gutter splints.   Short Term Goal 4: Patient will increase PROM of digit flexion and extension in left hand to [facility] for increased ability to use left hand as an active assist during functional activities.  Short Term Goal 5: New Goal 07/08/12:  Patients wound size will decrease to  3cmx.5cm with a .25 cm depth. Additional Short Term Goals?: Yes Short Term Goal 6: New Goal 07/08/12:  Patient will be educated on precautions and contraindications of wound care. Long Term Goals Time to Complete Long Term Goals: 8 weeks Long Term Goal 1: Patient will use left hand actively with all daily activities.  Long Term Goal 2: Patient will decrease edema in her left hand by 2 cm for increased mobility in left hand during functional  activtiies. Long Term Goal 3: Patient will increase AAROM in her left hand to [facility] for use of left hand with grasping items when completing household chores.   Long Term Goal 4: Assess left wrist AROM, strength, grip strength, pinch strength, and coordination when indicated by MD. Long Term Goal 5: New goal 07/08/12:  Patients wound will be 100% healed with 100% ganualation.   Problem List Patient Active Problem List  Diagnosis  . ANEMIA  . BIPOLAR DISORDER UNSPECIFIED  . ANXIETY, CHRONIC  . NICOTINE ADDICTION  . CUBITAL TUNNEL SYNDROME  . ACUTE SINUSITIS, UNSPECIFIED  . ACUTE BRONCHITIS  . Pain in Joint, Upper Arm  . NECK PAIN, CHRONIC  . LATERAL EPICONDYLITIS  . DIARRHEA NOS  . RUQ PAIN  . ABDOMINAL PAIN, GENERALIZED  . ABDOMINAL PAIN, LOWER  . ABNORMAL TRANSAMINASE, (LFT'S)  . OTHER ABNORMAL BLOOD CHEMISTRY  . CLOSED FRACTURE OF SURGICAL NECK OF HUMERUS  . LIVER FUNCTION TESTS, ABNORMAL, HX OF  . Ankle sprain  . Radius distal fracture  . Wrist pain, left  . Edema of hand    End of Session Activity Tolerance: Patient tolerated treatment well General Behavior During Session: [facility] for tasks performed Cognition: [facility] for tasks performed  GO    Shirlean Mylar, OTR/L  07/08/2012, 11:33 AM

## 2012-07-08 NOTE — Progress Notes (Signed)
Patient ID: Crystal Stein, female   DOB: 10-19-1970, 42 y.o.   MRN: [id number] Chief Complaint  Patient presents with  . Follow-up    post op left wrist DOS 06/17/12    Postop visit staples are removed. X-rays will be taken next visit.  Mid palmar portion of wound opening up    Treat with dressing changes if no improvement add ATBX

## 2012-07-11 ENCOUNTER — Inpatient Hospital Stay (HOSPITAL_COMMUNITY): Admission: RE | Admit: 2012-07-11 | Payer: Medicaid Other | Source: Ambulatory Visit

## 2012-07-11 ENCOUNTER — Telehealth: Payer: Self-pay | Admitting: Orthopedic Surgery

## 2012-07-11 ENCOUNTER — Ambulatory Visit (HOSPITAL_COMMUNITY)
Admission: RE | Admit: 2012-07-11 | Discharge: 2012-07-11 | Disposition: A | Discharge status: Home / Self Care | Payer: Medicaid Other | Source: Ambulatory Visit | Attending: Orthopedic Surgery | Admitting: Orthopedic Surgery

## 2012-07-11 NOTE — Telephone Encounter (Signed)
Mandy from [facility] Department, Physical Therapy, ph # [phone],  states patient did not show for her appointment today, and that they have not yet heard from her. States Dr Romeo Apple requested to be notified if she did not attend therapy.  Her next scheduled appointment is Thursday, 07/14/12. Patient's ph# is [phone] (Home)

## 2012-07-11 NOTE — Progress Notes (Signed)
Occupational Therapy Treatment Patient Details  Name: FAIRY ASHLOCK MRN: [id number] Date of Birth: 1971/04/01  Today's Date: 07/11/2012 Time: 1515-1600 OT Time Calculation (min): 45 min Wound Care 1478-2956 30' Therex 2130-8657 15'  Visit#: 5  of 18   Re-eval: 07/29/12    Authorization: Medicaid 3 visits - Patient signed responsibility of payment form this date, as she has utilized the 3 visits authorized by [employer].  Authorization Time Period:    Authorization Visit#:   of    Subjective Symptoms/Limitations Symptoms: S: My guaze fell off yesterday so my momma and daddy had to help re-wrap it. They didn't do a very good job. Pain Assessment Currently in Pain?: No/denies  Precautions/Restrictions  Precautions Precautions:  (AA/PROM of digits only.)  Exercise/Treatments Hand Exercises MCPJ Flexion: AROM;5 reps MCPJ Extension: AROM;5 reps PIPJ Flexion: AROM;5 reps PIPJ Extension: AROM;5 reps DIPJ Flexion: AROM;5 reps DIPJ Extension: AROM;5 reps Joint Blocking Exercises: A/PROM at each joint x 5 Thumb Opposition: to small finger PIPJ x 5 reps Digit Abduction/Adduction: AROM x 10 Sponges: 5,6,8 Tendon Glides: x 10 reps     Manual Therapy Other Manual Therapy: Woundcare: Wound has moderate yellow slough. PT debrided wound and surrounding area, removing dead/dry skin, loosened yellow slough with scalpel and removed min-mod amount this date. Applied Acticoat Silcryst to wound bed, vaseline to periarea of wound and entire surgical scar. Placed moistened 2x2 over wound and wrapped with gauze.   Occupational Therapy Assessment and Plan OT Assessment and Plan Clinical Impression Statement: A: Able to remove yellow slough from wound this date.  OT Plan: P: Continue wound care, decreasing wound size by .25 cm squared or better. Improve AROM of digits to WNL.    Goals Short Term Goals Time to Complete Short Term Goals: 4 weeks Short Term Goal 1: Patient will be educated on  HEP for left hand AAROM and edema control. Short Term Goal 2: Patient will decrease edema in her left hand by .75 cm for increased ability to move her hand during functional activities.   Short Term Goal 2 Progress: Progressing toward goal Short Term Goal 3: Fabricate and educate patient on use of left wrist extension and IF and LF gutter splints.   Short Term Goal 3 Progress: Progressing toward goal Short Term Goal 4: Patient will increase PROM of digit flexion and extension in left hand to [facility] for increased ability to use left hand as an active assist during functional activities.  Short Term Goal 4 Progress: Progressing toward goal Short Term Goal 5: New Goal 07/08/12:  Patients wound size will decrease to 3cmx.5cm with a .25 cm depth. Short Term Goal 5 Progress: Progressing toward goal Additional Short Term Goals?: Yes Short Term Goal 6: New Goal 07/08/12:  Patient will be educated on precautions and contraindications of wound care. Short Term Goal 6 Progress: Progressing toward goal Long Term Goals Time to Complete Long Term Goals: 8 weeks Long Term Goal 1: Patient will use left hand actively with all daily activities.  Long Term Goal 1 Progress: Progressing toward goal Long Term Goal 2: Patient will decrease edema in her left hand by 2 cm for increased mobility in left hand during functional activtiies. Long Term Goal 2 Progress: Progressing toward goal Long Term Goal 3: Patient will increase AAROM in her left hand to [facility] for use of left hand with grasping items when completing household chores.   Long Term Goal 3 Progress: Progressing toward goal Long Term Goal 4: Assess left wrist AROM,  strength, grip strength, pinch strength, and coordination when indicated by MD. Long Term Goal 4 Progress: Progressing toward goal Long Term Goal 5: New goal 07/08/12:  Patients wound will be 100% healed with 100% ganualation.  Long Term Goal 5 Progress: Progressing toward goal  Problem List Patient  Active Problem List  Diagnosis  . ANEMIA  . BIPOLAR DISORDER UNSPECIFIED  . ANXIETY, CHRONIC  . NICOTINE ADDICTION  . CUBITAL TUNNEL SYNDROME  . ACUTE SINUSITIS, UNSPECIFIED  . ACUTE BRONCHITIS  . Pain in Joint, Upper Arm  . NECK PAIN, CHRONIC  . LATERAL EPICONDYLITIS  . DIARRHEA NOS  . RUQ PAIN  . ABDOMINAL PAIN, GENERALIZED  . ABDOMINAL PAIN, LOWER  . ABNORMAL TRANSAMINASE, (LFT'S)  . OTHER ABNORMAL BLOOD CHEMISTRY  . CLOSED FRACTURE OF SURGICAL NECK OF HUMERUS  . LIVER FUNCTION TESTS, ABNORMAL, HX OF  . Ankle sprain  . Radius distal fracture  . Wrist pain, left  . Edema of hand    End of Session Activity Tolerance: Patient tolerated treatment well General Behavior During Session: [facility] for tasks performed Cognition: [facility] for tasks performed   Limmie Patricia, OTR/L 07/11/2012, 4:12 PM

## 2012-07-13 ENCOUNTER — Ambulatory Visit (HOSPITAL_COMMUNITY)
Admission: RE | Admit: 2012-07-13 | Discharge: 2012-07-13 | Disposition: A | Discharge status: Home / Self Care | Payer: Medicaid Other | Source: Ambulatory Visit | Attending: Orthopedic Surgery | Admitting: Orthopedic Surgery

## 2012-07-13 DIAGNOSIS — M25532 Pain in left wrist: Secondary | ICD-10-CM

## 2012-07-13 DIAGNOSIS — R600 Localized edema: Secondary | ICD-10-CM

## 2012-07-13 NOTE — Progress Notes (Addendum)
Occupational Therapy Treatment Patient Details  Name: Crystal Stein MRN: [id number] Date of Birth: 10-28-1970  Today's Date: 07/13/2012 Time: 1030-1100 OT Time Calculation (min): 30 min Manual Therapy 10' Wound Care 15' Therapeutic Exercises 5' Visit#: 6  of 18   Re-eval: 07/29/12    Authorization: medicaid visits utilized has signed responsibility of payment form.  Authorization Time Period: n/a  Authorization Visit#:   of    Subjective Symptoms/Limitations Symptoms: S:  I have been a little itchy on my hand, I guess that means its healing. Pain Assessment Currently in Pain?: No/denies  Precautions/Restrictions   AA/PROM of digits into flexion and extension. Patient must come to therapy 3 times a week, Call MD if she does not attend therapy. Exercise/Treatments Hand Exercises MCPJ Flexion: AROM;5 reps MCPJ Extension: AROM;5 reps PIPJ Flexion: AROM;5 reps PIPJ Extension: AROM;5 reps DIPJ Flexion: AROM;5 reps DIPJ Extension: AROM;5 reps Joint Blocking Exercises: A/PROM at each joint x 5 Thumb Opposition: time Digit Abduction/Adduction: AROM x 10 Sponges: 6, 7, 7 Tendon Glides: time     Manual Therapy Manual Therapy: Other (comment) Massage: Massage and PROM/joint blocking at each digit and each joint to increase mobility and decrease edema in left hand. Other Manual Therapy: Woundcare:  Wound has moderate yellow soft slough.  OT debrided wound and surround area, removing dead/dry skin, loosened yellow slough with forceps and removed min-mod amounts with forceps and scalpel.  Applied acticoat silicryst to wound care to promote softening of slough.  applied vaseline to periarea of wound and entire surgical scar.  Place moistened 2 x 2 over wound area and wrapped with guaze.    Occupational Therapy Assessment and Plan OT Assessment and Plan Clinical Impression Statement: A:  WFL PROM and AAROM this date.  Wound is 3 cm in length by .8 cm in width, .3 cm depth.   OT  Plan: P:  Increase wound healing and decrease wound size by 25%.  Add large pegboard with wrist splint on for increased functional use of left hand with functional activities.     Goals Short Term Goals Time to Complete Short Term Goals: 4 weeks Short Term Goal 1: Patient will be educated on HEP for left hand AAROM and edema control. Short Term Goal 2: Patient will decrease edema in her left hand by .75 cm for increased ability to move her hand during functional activities.   Short Term Goal 3: Fabricate and educate patient on use of left wrist extension and IF and LF gutter splints.   Short Term Goal 4: Patient will increase PROM of digit flexion and extension in left hand to [facility] for increased ability to use left hand as an active assist during functional activities.  Short Term Goal 5: New Goal 07/08/12:  Patients wound size will decrease to 3cmx.5cm with a .25 cm depth. Additional Short Term Goals?: Yes Short Term Goal 6: New Goal 07/08/12:  Patient will be educated on precautions and contraindications of wound care. Long Term Goals Time to Complete Long Term Goals: 8 weeks Long Term Goal 1: Patient will use left hand actively with all daily activities.  Long Term Goal 2: Patient will decrease edema in her left hand by 2 cm for increased mobility in left hand during functional activtiies. Long Term Goal 3: Patient will increase AAROM in her left hand to [facility] for use of left hand with grasping items when completing household chores.   Long Term Goal 4: Assess left wrist AROM, strength, grip strength, pinch strength,  and coordination when indicated by MD. Long Term Goal 5: New goal 07/08/12:  Patients wound will be 100% healed with 100% ganualation.   Problem List Patient Active Problem List  Diagnosis  . ANEMIA  . BIPOLAR DISORDER UNSPECIFIED  . ANXIETY, CHRONIC  . NICOTINE ADDICTION  . CUBITAL TUNNEL SYNDROME  . ACUTE SINUSITIS, UNSPECIFIED  . ACUTE BRONCHITIS  . Pain in Joint, Upper Arm   . NECK PAIN, CHRONIC  . LATERAL EPICONDYLITIS  . DIARRHEA NOS  . RUQ PAIN  . ABDOMINAL PAIN, GENERALIZED  . ABDOMINAL PAIN, LOWER  . ABNORMAL TRANSAMINASE, (LFT'S)  . OTHER ABNORMAL BLOOD CHEMISTRY  . CLOSED FRACTURE OF SURGICAL NECK OF HUMERUS  . LIVER FUNCTION TESTS, ABNORMAL, HX OF  . Ankle sprain  . Radius distal fracture  . Wrist pain, left  . Edema of hand    End of Session Activity Tolerance: Patient tolerated treatment well General Behavior During Session: [facility] for tasks performed Cognition: [facility] for tasks performed   Shirlean Mylar, OTR/L  07/13/2012, 11:57 AM

## 2012-07-14 ENCOUNTER — Ambulatory Visit (INDEPENDENT_AMBULATORY_CARE_PROVIDER_SITE_OTHER): Payer: Medicaid Other | Admitting: Orthopedic Surgery

## 2012-07-14 VITALS — BP 70/58 | Ht 65.5 in | Wt 150.0 lb

## 2012-07-14 DIAGNOSIS — S52599A Other fractures of lower end of unspecified radius, initial encounter for closed fracture: Secondary | ICD-10-CM

## 2012-07-14 DIAGNOSIS — S52509A Unspecified fracture of the lower end of unspecified radius, initial encounter for closed fracture: Secondary | ICD-10-CM

## 2012-07-14 NOTE — Progress Notes (Signed)
Patient ID: Crystal Stein, female   DOB: 08/24/1970, 42 y.o.   MRN: [id number] Chief Complaint  Patient presents with  . Wound Check    Surgery date December 27 of treatment internal fixation left distal radius fracture/wound check    1. Radius distal fracture     Status post wound care doing well range of motion is improved to near normal.  There is a linear incision with a linear wound about a centimeter and a half to 2 cm long by 3-4 mm wide which is shrinking  Continue wound care splinting followup in a week for wound check

## 2012-07-14 NOTE — Telephone Encounter (Signed)
Patient was seen for appointment today. Noted.

## 2012-07-14 NOTE — Patient Instructions (Signed)
Continue splint  Continue therapy

## 2012-07-15 ENCOUNTER — Ambulatory Visit (HOSPITAL_COMMUNITY)
Admission: RE | Admit: 2012-07-15 | Discharge: 2012-07-15 | Disposition: A | Discharge status: Home / Self Care | Payer: Medicaid Other | Source: Ambulatory Visit | Attending: Orthopedic Surgery | Admitting: Orthopedic Surgery

## 2012-07-15 DIAGNOSIS — R600 Localized edema: Secondary | ICD-10-CM

## 2012-07-15 DIAGNOSIS — M25532 Pain in left wrist: Secondary | ICD-10-CM

## 2012-07-15 NOTE — Progress Notes (Signed)
Occupational Therapy Treatment Patient Details  Name: TRANIYA PRICHETT MRN: [id number] Date of Birth: [birth year]  Today's Date: 07/15/2012 Time: 4098-1191 OT Time Calculation (min): 25 min Wound care 25' Visit#: 7  of 18   Re-eval: 07/29/12    Authorization: medicaid visits utilized has signed responsibility of payment form.  Subjective  S:  I went to the MD he said keep wearing my splint.  (patient does not have splint on this date and I reminded her of the importance of use of splint to allow for wrist fracture to heal correctly.) Pain Assessment Currently in Pain?: No/denies  Precautions/Restrictions   splint to be worn at all times.  Exercise/Treatments    Wound Care: Wound has moderate dry, tought yellow slough this date.  OT debrided wound and surrounding area, removing dead/dry skin, loosed  min yellow slough and removed with forcps and scalpel.  Applied acticoat silicryst with hydragel to promote softening of slough.  Applied vaseline to periarea of wound and entire surgical scar.  Placed moistened  2 x 2 over wound area and wrapped with gauze.    Occupational Therapy Assessment and Plan OT Assessment and Plan Clinical Impression Statement: A:  AROM of digits is WFL this date.  Patient did not bring splint and did not complete functional activities as planned to avoid injuring her wrist.  Slough no longer moist, applied silver dressing in hopes of softening slough for easier removal to promote 100% granulation.  OT Plan: P: Increase wound healing and decrease wound size by 25%. Add large pegboard with wrist splint on for increased functional use of left hand with functional activities.   Goals Short Term Goals Time to Complete Short Term Goals: 4 weeks Short Term Goal 1: Patient will be educated on HEP for left hand AAROM and edema control. Short Term Goal 2: Patient will decrease edema in her left hand by .75 cm for increased ability to move her hand during functional  activities.   Short Term Goal 3: Fabricate and educate patient on use of left wrist extension and IF and LF gutter splints.   Short Term Goal 4: Patient will increase PROM of digit flexion and extension in left hand to [facility] for increased ability to use left hand as an active assist during functional activities.  Short Term Goal 5: New Goal 07/08/12:  Patients wound size will decrease to 3cmx.5cm with a .25 cm depth. Additional Short Term Goals?: Yes Short Term Goal 6: New Goal 07/08/12:  Patient will be educated on precautions and contraindications of wound care. Long Term Goals Time to Complete Long Term Goals: 8 weeks Long Term Goal 1: Patient will use left hand actively with all daily activities.  Long Term Goal 2: Patient will decrease edema in her left hand by 2 cm for increased mobility in left hand during functional activtiies. Long Term Goal 3: Patient will increase AAROM in her left hand to [facility] for use of left hand with grasping items when completing household chores.   Long Term Goal 4: Assess left wrist AROM, strength, grip strength, pinch strength, and coordination when indicated by MD. Long Term Goal 5: New goal 07/08/12:  Patients wound will be 100% healed with 100% ganualation.   Problem List Patient Active Problem List  Diagnosis  . ANEMIA  . BIPOLAR DISORDER UNSPECIFIED  . ANXIETY, CHRONIC  . NICOTINE ADDICTION  . CUBITAL TUNNEL SYNDROME  . ACUTE SINUSITIS, UNSPECIFIED  . ACUTE BRONCHITIS  . Pain in Joint, Upper Arm  .  NECK PAIN, CHRONIC  . LATERAL EPICONDYLITIS  . DIARRHEA NOS  . RUQ PAIN  . ABDOMINAL PAIN, GENERALIZED  . ABDOMINAL PAIN, LOWER  . ABNORMAL TRANSAMINASE, (LFT'S)  . OTHER ABNORMAL BLOOD CHEMISTRY  . CLOSED FRACTURE OF SURGICAL NECK OF HUMERUS  . LIVER FUNCTION TESTS, ABNORMAL, HX OF  . Ankle sprain  . Radius distal fracture  . Wrist pain, left  . Edema of hand    End of Session Activity Tolerance: Patient tolerated treatment  well General Behavior During Session: [facility] for tasks performed Cognition: [facility] for tasks performed  GO    Shirlean Mylar, OTR/L  07/15/2012, 3:46 PM

## 2012-07-17 ENCOUNTER — Encounter (HOSPITAL_COMMUNITY): Payer: Self-pay | Admitting: *Deleted

## 2012-07-17 ENCOUNTER — Emergency Department (HOSPITAL_COMMUNITY)
Admission: EM | Admit: 2012-07-17 | Discharge: 2012-07-18 | Disposition: A | Discharge status: Home / Self Care | Payer: Medicaid Other | Attending: Emergency Medicine | Admitting: Emergency Medicine

## 2012-07-17 DIAGNOSIS — Z8719 Personal history of other diseases of the digestive system: Secondary | ICD-10-CM | POA: Insufficient documentation

## 2012-07-17 DIAGNOSIS — R112 Nausea with vomiting, unspecified: Secondary | ICD-10-CM | POA: Insufficient documentation

## 2012-07-17 DIAGNOSIS — J449 Chronic obstructive pulmonary disease, unspecified: Secondary | ICD-10-CM | POA: Insufficient documentation

## 2012-07-17 DIAGNOSIS — J4489 Other specified chronic obstructive pulmonary disease: Secondary | ICD-10-CM | POA: Insufficient documentation

## 2012-07-17 DIAGNOSIS — R55 Syncope and collapse: Secondary | ICD-10-CM | POA: Insufficient documentation

## 2012-07-17 DIAGNOSIS — F17200 Nicotine dependence, unspecified, uncomplicated: Secondary | ICD-10-CM | POA: Insufficient documentation

## 2012-07-17 DIAGNOSIS — K219 Gastro-esophageal reflux disease without esophagitis: Secondary | ICD-10-CM | POA: Insufficient documentation

## 2012-07-17 DIAGNOSIS — F319 Bipolar disorder, unspecified: Secondary | ICD-10-CM | POA: Insufficient documentation

## 2012-07-17 DIAGNOSIS — F411 Generalized anxiety disorder: Secondary | ICD-10-CM | POA: Insufficient documentation

## 2012-07-17 DIAGNOSIS — J45909 Unspecified asthma, uncomplicated: Secondary | ICD-10-CM | POA: Insufficient documentation

## 2012-07-17 DIAGNOSIS — R5381 Other malaise: Secondary | ICD-10-CM | POA: Insufficient documentation

## 2012-07-17 DIAGNOSIS — Z8781 Personal history of (healed) traumatic fracture: Secondary | ICD-10-CM | POA: Insufficient documentation

## 2012-07-17 DIAGNOSIS — N390 Urinary tract infection, site not specified: Secondary | ICD-10-CM

## 2012-07-17 DIAGNOSIS — Z3202 Encounter for pregnancy test, result negative: Secondary | ICD-10-CM | POA: Insufficient documentation

## 2012-07-17 DIAGNOSIS — E860 Dehydration: Secondary | ICD-10-CM

## 2012-07-17 DIAGNOSIS — Z79899 Other long term (current) drug therapy: Secondary | ICD-10-CM | POA: Insufficient documentation

## 2012-07-17 LAB — CBC WITH DIFFERENTIAL/PLATELET
Basophils Relative: 1 % (ref 0–1)
Eosinophils Absolute: 0.3 10*3/uL (ref 0.0–0.7)
Eosinophils Relative: 5 % (ref 0–5)
Lymphs Abs: 2.1 10*3/uL (ref 0.7–4.0)
MCH: 33.6 pg (ref 26.0–34.0)
MCHC: 33.4 g/dL (ref 30.0–36.0)
MCV: 100.6 fL — ABNORMAL HIGH (ref 78.0–100.0)
Monocytes Relative: 7 % (ref 3–12)
Platelets: 314 10*3/uL (ref 150–400)
RBC: 3.45 MIL/uL — ABNORMAL LOW (ref 3.87–5.11)

## 2012-07-17 LAB — URINALYSIS, ROUTINE W REFLEX MICROSCOPIC
Bilirubin Urine: NEGATIVE
Hgb urine dipstick: NEGATIVE
Ketones, ur: NEGATIVE mg/dL
Protein, ur: NEGATIVE mg/dL
Urobilinogen, UA: 0.2 mg/dL (ref 0.0–1.0)

## 2012-07-17 LAB — BASIC METABOLIC PANEL
BUN: 6 mg/dL (ref 6–23)
Calcium: 8.6 mg/dL (ref 8.4–10.5)
GFR calc non Af Amer: >90 mL/min (ref >90)
Glucose, Bld: 75 mg/dL (ref 70–99)
Sodium: 133 mEq/L — ABNORMAL LOW (ref 135–145)

## 2012-07-17 LAB — GLUCOSE, CAPILLARY: Glucose-Capillary: 80 mg/dL (ref 70–99)

## 2012-07-17 MED ORDER — ONDANSETRON HCL 4 MG/2ML IJ SOLN
4.0000 mg | Freq: Once | INTRAMUSCULAR | Status: AC
  Administered 2012-07-17: 4 mg via INTRAVENOUS
  Filled 2012-07-17: qty 2

## 2012-07-17 MED ORDER — ONDANSETRON HCL 4 MG/2ML IJ SOLN: 4.0000 mg | Freq: Once | INTRAMUSCULAR | Status: AC

## 2012-07-17 MED ORDER — SODIUM CHLORIDE 0.9 % IV SOLN
INTRAVENOUS | Status: DC
  Administered 2012-07-17: 22:00:00 via INTRAVENOUS

## 2012-07-17 MED ORDER — SODIUM CHLORIDE 0.9 % IV BOLUS (SEPSIS)
1000.0000 mL | Freq: Once | INTRAVENOUS | Status: AC
  Administered 2012-07-17: 1000 mL via INTRAVENOUS

## 2012-07-17 NOTE — ED Notes (Signed)
Pt states she has been nauseated and vomiting x 3 wks. Pt states she has seen her PCP but wasn't given any nausea meds. Pt also c/o being disoriented while driving to the hospital, also c/o being "achy" and hot ans cold. Pt had surgery in her left wrist, states she had a  plate placed in wrist. Area where staples were, looks infected. Pt states she is being treated for an infection in her left wrist.

## 2012-07-17 NOTE — ED Notes (Signed)
Asked patient to collect urine sample. Patient states she is unable to urinate at this time because she "just went to the bathroom before she got called back" RN notified

## 2012-07-17 NOTE — ED Notes (Signed)
Pt c/o nausea and vomiting x 3 weeks.  States the symptoms started about the same time she got a plate placed in her left wrist.  Her wrist became infected and she was placed on antibiotics roughly a week ago.  States she is now experiencing episodes of chills and states "I feel real disoriented". Pt is alert and orientated x4.

## 2012-07-17 NOTE — ED Provider Notes (Signed)
History   This chart was scribed for Flint Melter, MD by Leone Payor, ED Scribe. This patient was seen in room APA19/[facility]19 and the patient's care was started 9:48 PM.   CSN: [id number]  Arrival date & time 07/17/12  1919   None     Chief Complaint  Patient presents with  . Emesis  . Cough  . Urinary Retention    The history is provided by the patient. No language interpreter was used.    Crystal Stein is a 42 y.o. female who presents to the Emergency Department complaining of ongoing, unchanged 1-2 episodes of nausea and vomiting daily starting 3 weeks ago. Pt has associated dizziness, weakness, cough. She denies sneezing, coughing, diarrhea, abdominal pain. Pt has a left wrist fracture for which she had surgery performed by Dr. Romeo Apple on 06/17/12. Pt is currently taking antibiotics because of an infection associated with the surgery.   Pt has h/o asthma, GERD, COPD.  Pt is a current everyday smoker but denies alcohol use.  Past Medical History  Diagnosis Date  . Anxiety   . Depression   . Asthma   . 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 COPD (chronic obstructive pulmonary disease)   . GERD (gastroesophageal reflux disease)   . Bipolar disorder   . Blood transfusion 2006    Past Surgical History  Procedure Date  . Tennis elbow release 04/27/06    left; Romeo Apple, [facility]  . Multiple tooth extractions 10/07    full upper  . Orif proximal humerus fracture 08/06    left,  . Dilation and curettage of uterus June 2012    failed ablation  . Tubal ligation June 2012    [place]  . Bipolar disorder   . Hysteroscopy 01/27/2011    Procedure: HYSTEROSCOPY;  Surgeon: Tilda Burrow, MD;  Location: [facility];  Service: Gynecology;  Laterality: N/A;  . Vaginal hysterectomy 05/19/2011    Procedure: HYSTERECTOMY VAGINAL;  Surgeon: Tilda Burrow, MD;  Location: [facility];  Service: Gynecology;  Laterality: N/A;  . Orif wrist fracture 06/17/2012    Procedure: OPEN  REDUCTION INTERNAL FIXATION (ORIF) WRIST FRACTURE;  Surgeon: Vickki Hearing, MD;  Location: [facility];  Service: Orthopedics;  Laterality: Left;  . Carpal tunnel release 06/17/2012    Procedure: CARPAL TUNNEL RELEASE;  Surgeon: Vickki Hearing, MD;  Location: [facility];  Service: Orthopedics;  Laterality: Left;    Family History  Problem Relation Age of Onset  . Anesthesia problems Neg Hx   . Hypotension Neg Hx   . Malignant hyperthermia Neg Hx   . Pseudochol deficiency Neg Hx   . Diabetes      History  Substance Use Topics  . Smoking status: Current Every Day Smoker -- 1.0 packs/day for 22 years    Types: Cigarettes  . Smokeless tobacco: Not on file  . Alcohol Use: No    No OB history provided.   Review of Systems  A complete 10 system review of systems was obtained and all systems are negative except as noted in the HPI and PMH.    Allergies  Review of patient's allergies indicates no known allergies.  Home Medications   Current Outpatient Rx  Name  Route  Sig  Dispense  Refill  . ALPRAZOLAM 2 MG PO TABS   Oral   Take 2 mg by mouth 3 (three) times daily.         . CEPHALEXIN 500 MG  PO CAPS   Oral   Take 1 capsule (500 mg total) by mouth 2 (two) times daily.   32 capsule   0   . HYDROCODONE-ACETAMINOPHEN 10-325 MG PO TABS   Oral   Take 1 tablet by mouth every 4 (four) hours as needed. Pain         . OMEPRAZOLE 20 MG PO CPDR   Oral   Take 20 mg by mouth daily.           Marland Kitchen PAROXETINE HCL ER 12.5 MG PO TB24   Oral   Take 12.5 mg by mouth daily. Takes every morning along with a 37.5mg  tablet         . PAROXETINE HCL ER 37.5 MG PO TB24   Oral   Take 75 mg by mouth 2 (two) times daily.          Marland Kitchen TIZANIDINE HCL 4 MG PO CAPS   Oral   Take 4 mg by mouth 3 (three) times daily.         Marland Kitchen ZIPRASIDONE HCL 40 MG PO CAPS   Oral   Take 40 mg by mouth daily.            BP 113/79  Pulse 104  Temp 98 F (36.7 C) (Oral)  Resp 20  Ht 5' 4.5"  (1.638 m)  Wt 134 lb (60.782 kg)  BMI 22.65 kg/m2  SpO2 98%  LMP 04/23/2011  Physical Exam  Nursing note and vitals reviewed. Constitutional: She is oriented to person, place, and time. She appears well-developed and well-nourished.  HENT:  Head: Normocephalic and atraumatic.  Eyes: Conjunctivae normal and EOM are normal. Pupils are equal, round, and reactive to light.  Neck: Normal range of motion and phonation normal. Neck supple.  Cardiovascular: Normal rate, regular rhythm and intact distal pulses.   Pulmonary/Chest: Effort normal and breath sounds normal. She exhibits no tenderness.  Abdominal: Soft. She exhibits no distension. There is no tenderness. There is no guarding.  Musculoskeletal: Normal range of motion.       Upper left back tenderness to palpation.   Left hand there is a slight dehissed wound with mild swelling but no erythema, fluctuance, drainage, or streaking. Fair ROM hand and wrist.   Neurological: She is alert and oriented to person, place, and time. She has normal strength. She exhibits normal muscle tone.  Skin: Skin is warm and dry.  Psychiatric: She has a normal mood and affect. Her behavior is normal. Judgment and thought content normal.    ED Course  Procedures (including critical care time)  DIAGNOSTIC STUDIES: Oxygen Saturation is 98% on room air, normal by my interpretation.    COORDINATION OF CARE:  10:00 PM Discussed treatment plan which includes CBC panel, basic metabolic panel, UA with pt at bedside and pt agreed to plan.      Reevaluation: 00:20- she feels better, and has a sense of needing to void  Labs Reviewed  URINALYSIS, ROUTINE W REFLEX MICROSCOPIC - Abnormal; Notable for the following:    Specific Gravity, Urine <1.005 (*)     Leukocytes, UA SMALL (*)     All other components within normal limits  CBC WITH DIFFERENTIAL - Abnormal; Notable for the following:    RBC 3.45 (*)     Hemoglobin 11.6 (*)     HCT 34.7 (*)     MCV  100.6 (*)     RDW 16.6 (*)     All other components within normal  limits  BASIC METABOLIC PANEL - Abnormal; Notable for the following:    Sodium 133 (*)     All other components within normal limits  URINE MICROSCOPIC-ADD ON - Abnormal; Notable for the following:    Squamous Epithelial / LPF MANY (*)     Bacteria, UA FEW (*)     All other components within normal limits  GLUCOSE, CAPILLARY  POCT PREGNANCY, URINE  URINE CULTURE   Nursing notes, applicable records and vitals reviewed.  Radiologic Images/Reports reviewed.    1. Dehydration   2. UTI (lower urinary tract infection)       MDM  Evaluation is consistent with dehydration. Patient improved, with treatment in the ED.Doubt metabolic instability, serious bacterial infection or impending vascular collapse; the patient is stable for discharge. Patient is currently taking Keflex. She's been on for 2 days. If she has a UTI, Keflex, likely, treated. Will not add antibiotics until urine culture returned      I personally performed the services described in this documentation, which was scribed in my presence. The recorded information has been reviewed and is accurate.      Plan: Home Medications- Zofran; Home Treatments-push fluids; Recommended follow up- PCP, when necessary    Flint Melter, MD 07/18/12 [phone]

## 2012-07-18 ENCOUNTER — Ambulatory Visit (HOSPITAL_COMMUNITY)
Admission: RE | Admit: 2012-07-18 | Discharge: 2012-07-18 | Disposition: A | Discharge status: Home / Self Care | Payer: Medicaid Other | Source: Ambulatory Visit | Attending: Orthopedic Surgery | Admitting: Orthopedic Surgery

## 2012-07-18 DIAGNOSIS — M25532 Pain in left wrist: Secondary | ICD-10-CM

## 2012-07-18 DIAGNOSIS — R600 Localized edema: Secondary | ICD-10-CM

## 2012-07-18 NOTE — Progress Notes (Signed)
Occupational Therapy Treatment Patient Details  Name: Crystal Stein MRN: [id number] Date of Birth: 08-24-1970  Today's Date: 07/18/2012 Time: 0950-1010 OT Time Calculation (min): 20 min Debridement 20' Visit#: 8  of 18   Re-eval: 07/29/12    Authorization: medicaid visits utilized has signed responsibility of payment form.   Subjective S:  I had to go to the ED last night.  I am dehydrated because of a urinary tract infection.  (Patient did not bring her splint, therefore, could not complete hand exercises) Pain Assessment Currently in Pain?: No/denies  Precautions/Restrictions   Hand AROM and woundcare only  Exercise/Treatments    Woundcare: Wound size is .8 cm in width by 2.0 cm in length, .2cm depth.  Wound has moderate dry tough slough this date.  OT debrided wound and surrounding area, removing dead/dry skin, a loosened min-mod slough, which was removed iwth forceps and scissors.  Applied medihoney gel to wound bed, vaseline to entire surgical scar and wound periarea, placed moistened 2x2 over wound bed, and wrapped with gauze.    Occupational Therapy Assessment and Plan OT Assessment and Plan Clinical Impression Statement: A:  Wound has decreased in size to .8 cm x 2.0 cm.  Reiterated the importance of wearing her splint at all times, bringing/wearing to therapy sessions so that we can continue to work on digit AROM. OT Plan: P:  Complete Hand AROM exercises if patient brings splint.    Goals Short Term Goals Time to Complete Short Term Goals: 4 weeks Short Term Goal 1: Patient will be educated on HEP for left hand AAROM and edema control. Short Term Goal 1 Progress: Met Short Term Goal 2: Patient will decrease edema in her left hand by .75 cm for increased ability to move her hand during functional activities.   Short Term Goal 2 Progress: Met Short Term Goal 3: Fabricate and educate patient on use of left wrist extension and IF and LF gutter splints.   Short Term  Goal 3 Progress: Met Short Term Goal 4: Patient will increase PROM of digit flexion and extension in left hand to [facility] for increased ability to use left hand as an active assist during functional activities.  Short Term Goal 4 Progress: Met Short Term Goal 5: New Goal 07/08/12:  Patients wound size will decrease to 3cmx.5cm with a .25 cm depth. Short Term Goal 5 Progress: Met Additional Short Term Goals?: Yes Short Term Goal 6: New Goal 07/08/12:  Patient will be educated on precautions and contraindications of wound care. Short Term Goal 6 Progress: Met Long Term Goals Time to Complete Long Term Goals: 8 weeks Long Term Goal 1: Patient will use left hand actively with all daily activities.  Long Term Goal 1 Progress: Progressing toward goal Long Term Goal 2: Patient will decrease edema in her left hand by 2 cm for increased mobility in left hand during functional activtiies. Long Term Goal 2 Progress: Progressing toward goal Long Term Goal 3: Patient will increase AAROM in her left hand to [facility] for use of left hand with grasping items when completing household chores.   Long Term Goal 3 Progress: Progressing toward goal Long Term Goal 4: Assess left wrist AROM, strength, grip strength, pinch strength, and coordination when indicated by MD. Long Term Goal 4 Progress: Progressing toward goal Long Term Goal 5: New goal 07/08/12:  Patients wound will be 100% healed with 100% ganualation.  Long Term Goal 5 Progress: Progressing toward goal  Problem List Patient Active Problem  List  Diagnosis  . ANEMIA  . BIPOLAR DISORDER UNSPECIFIED  . ANXIETY, CHRONIC  . NICOTINE ADDICTION  . CUBITAL TUNNEL SYNDROME  . ACUTE SINUSITIS, UNSPECIFIED  . ACUTE BRONCHITIS  . Pain in Joint, Upper Arm  . NECK PAIN, CHRONIC  . LATERAL EPICONDYLITIS  . DIARRHEA NOS  . RUQ PAIN  . ABDOMINAL PAIN, GENERALIZED  . ABDOMINAL PAIN, LOWER  . ABNORMAL TRANSAMINASE, (LFT'S)  . OTHER ABNORMAL BLOOD CHEMISTRY  . CLOSED  FRACTURE OF SURGICAL NECK OF HUMERUS  . LIVER FUNCTION TESTS, ABNORMAL, HX OF  . Ankle sprain  . Radius distal fracture  . Wrist pain, left  . Edema of hand    End of Session Activity Tolerance: Patient tolerated treatment well General Behavior During Session: [facility] for tasks performed Cognition: [facility] for tasks performed  GO    Jacqualine Code 07/18/2012, 10:17 AM

## 2012-07-19 LAB — URINE CULTURE

## 2012-07-20 ENCOUNTER — Ambulatory Visit (HOSPITAL_COMMUNITY): Payer: Medicaid Other | Admitting: Specialist

## 2012-07-21 ENCOUNTER — Ambulatory Visit: Payer: Medicaid Other | Admitting: Orthopedic Surgery

## 2012-07-21 ENCOUNTER — Encounter: Payer: Self-pay | Admitting: Orthopedic Surgery

## 2012-07-22 ENCOUNTER — Ambulatory Visit (HOSPITAL_COMMUNITY)
Admission: RE | Admit: 2012-07-22 | Discharge: 2012-07-22 | Disposition: A | Discharge status: Home / Self Care | Payer: Medicaid Other | Source: Ambulatory Visit | Attending: Orthopedic Surgery | Admitting: Orthopedic Surgery

## 2012-07-22 DIAGNOSIS — M25532 Pain in left wrist: Secondary | ICD-10-CM

## 2012-07-22 DIAGNOSIS — R600 Localized edema: Secondary | ICD-10-CM

## 2012-07-22 NOTE — Progress Notes (Signed)
Occupational Therapy Treatment Patient Details  Name: Crystal Stein MRN: [id number] Date of Birth: [birth year]  Today's Date: 07/22/2012 Time: 9147-8295 OT Time Calculation (min): 30 min Wound Care 1115-1130 15' Therapeutic Exercises 1130-1145 15' Visit#: 9  of 18   Re-eval: 07/29/12    Authorization: medicaid visits utilized has signed responsibility of payment form.     Subjective S:  I can use my hand alot more, its not nearly as sore.   Pain Assessment Currently in Pain?: No/denies  Precautions/Restrictions   Digit AROM only  Exercise/Treatments Hand Exercises Sponges: with splint on completed x 3 attempts picking up 7 sponges each time.  Large Pegboard: 20 pegs with moderate difficulty, once removed glove and was able to complete easily .       Manual Therapy Other Manual Therapy: Wound size is .6 cm in width by 1.6 cm in length.  Wound has moderate soft yellow slough this date.  OT debrided wound and surrounding area, removing dead/dry skin, removed  moderate soft yellow slough with forceps and scissors.  Applied medihoney gel to wound bed, vaseline to entire surgical scar and wound periarea.  Placed moistened 2 x 2 over wound bed and wrapped with gauze.    Occupational Therapy Assessment and Plan OT Assessment and Plan Clinical Impression Statement: A:  Wound size has decreased to .6cm x .16 cm.  Resumed hand exercises, as patient brought splint with her today.  OTR/L placed foam on webspace of splint to decrease amount of irritation on her thumb from the splint.  OT Plan: P: Decrease wound size by 25% and increase ability to complete functional activities with left hand to WNL.    Goals Short Term Goals Time to Complete Short Term Goals: 4 weeks Short Term Goal 1: Patient will be educated on HEP for left hand AAROM and edema control. Short Term Goal 2: Patient will decrease edema in her left hand by .75 cm for increased ability to move her hand during functional  activities.   Short Term Goal 3: Fabricate and educate patient on use of left wrist extension and IF and LF gutter splints.   Short Term Goal 4: Patient will increase PROM of digit flexion and extension in left hand to [facility] for increased ability to use left hand as an active assist during functional activities.  Short Term Goal 5: New Goal 07/08/12:  Patients wound size will decrease to 3cmx.5cm with a .25 cm depth. Additional Short Term Goals?: Yes Short Term Goal 6: New Goal 07/08/12:  Patient will be educated on precautions and contraindications of wound care. Long Term Goals Time to Complete Long Term Goals: 8 weeks Long Term Goal 1: Patient will use left hand actively with all daily activities.  Long Term Goal 2: Patient will decrease edema in her left hand by 2 cm for increased mobility in left hand during functional activtiies. Long Term Goal 3: Patient will increase AAROM in her left hand to [facility] for use of left hand with grasping items when completing household chores.   Long Term Goal 4: Assess left wrist AROM, strength, grip strength, pinch strength, and coordination when indicated by MD. Long Term Goal 5: New goal 07/08/12:  Patients wound will be 100% healed with 100% ganualation.   Problem List Patient Active Problem List  Diagnosis  . ANEMIA  . BIPOLAR DISORDER UNSPECIFIED  . ANXIETY, CHRONIC  . NICOTINE ADDICTION  . CUBITAL TUNNEL SYNDROME  . ACUTE SINUSITIS, UNSPECIFIED  . ACUTE BRONCHITIS  .  Pain in Joint, Upper Arm  . NECK PAIN, CHRONIC  . LATERAL EPICONDYLITIS  . DIARRHEA NOS  . RUQ PAIN  . ABDOMINAL PAIN, GENERALIZED  . ABDOMINAL PAIN, LOWER  . ABNORMAL TRANSAMINASE, (LFT'S)  . OTHER ABNORMAL BLOOD CHEMISTRY  . CLOSED FRACTURE OF SURGICAL NECK OF HUMERUS  . LIVER FUNCTION TESTS, ABNORMAL, HX OF  . Ankle sprain  . Radius distal fracture  . Wrist pain, left  . Edema of hand    End of Session Activity Tolerance: Patient tolerated treatment  well General Behavior During Session: [facility] for tasks performed Cognition: [facility] for tasks performed OT Plan of Care Consulted and Agree with Plan of Care: Patient  Shirlean Mylar, OTR/L  07/22/2012, 12:06 PM

## 2012-07-25 ENCOUNTER — Ambulatory Visit (HOSPITAL_COMMUNITY)
Admission: RE | Admit: 2012-07-25 | Discharge: 2012-07-25 | Disposition: A | Discharge status: Home / Self Care | Payer: Medicaid Other | Source: Ambulatory Visit | Attending: Orthopedic Surgery | Admitting: Orthopedic Surgery

## 2012-07-25 DIAGNOSIS — Y838 Other surgical procedures as the cause of abnormal reaction of the patient, or of later complication, without mention of misadventure at the time of the procedure: Secondary | ICD-10-CM | POA: Insufficient documentation

## 2012-07-25 DIAGNOSIS — M25532 Pain in left wrist: Secondary | ICD-10-CM

## 2012-07-25 DIAGNOSIS — IMO0001 Reserved for inherently not codable concepts without codable children: Secondary | ICD-10-CM | POA: Insufficient documentation

## 2012-07-25 DIAGNOSIS — M25539 Pain in unspecified wrist: Secondary | ICD-10-CM | POA: Insufficient documentation

## 2012-07-25 DIAGNOSIS — T8140XA Infection following a procedure, unspecified, initial encounter: Secondary | ICD-10-CM | POA: Insufficient documentation

## 2012-07-25 DIAGNOSIS — R600 Localized edema: Secondary | ICD-10-CM

## 2012-07-25 DIAGNOSIS — M25639 Stiffness of unspecified wrist, not elsewhere classified: Secondary | ICD-10-CM | POA: Insufficient documentation

## 2012-07-25 DIAGNOSIS — M6281 Muscle weakness (generalized): Secondary | ICD-10-CM | POA: Insufficient documentation

## 2012-07-25 NOTE — Progress Notes (Signed)
Occupational Therapy Treatment Patient Details  Name: Crystal Stein MRN: [id number] Date of Birth: 1970-12-11  Today's Date: 07/25/2012 Time: 0900-0920 OT Time Calculation (min): 20 min Wound care 20' Visit#: 10  of 18   Re-eval: 07/29/12    Authorization: medicaid visits utilized has signed responsibility of payment form   Subjective S:  I had to rewrap it, the gauze came loose. Pain Assessment Currently in Pain?: No/denies  Precautions/Restrictions   AROM to digits only.  Exercise/Treatments    Manual Therapy Manual Therapy: Other (comment) Other Manual Therapy: Wound size is .5 cm in width by 1.5 cm in length.  Wound has moderate soft yellow slough this date.  OT debrided wound and surrounding area, removing dead/dry skin, removed minimal soft yellow slough wiht forceps.  wound bed beginning to have trace granulation and scant bleeding with debridement.  Applied medihoney gel to wound bed, vaseline to entire surgical scar and wound periarea.  Placed moistened 2 x 2 over wound bed and wrapped with gauze.    Occupational Therapy Assessment and Plan OT Assessment and Plan Clinical Impression Statement: A:  Wound size has decreased to .5cm x 1.5 cm.  Declined hand exercises this date. OT Plan: P: Decrease wound size by 25% and increase ability to complete functional activities with left hand to WNL.    Goals Short Term Goals Time to Complete Short Term Goals: 4 weeks Short Term Goal 1: Patient will be educated on HEP for left hand AAROM and edema control. Short Term Goal 2: Patient will decrease edema in her left hand by .75 cm for increased ability to move her hand during functional activities.   Short Term Goal 3: Fabricate and educate patient on use of left wrist extension and IF and LF gutter splints.   Short Term Goal 4: Patient will increase PROM of digit flexion and extension in left hand to [facility] for increased ability to use left hand as an active assist during  functional activities.  Short Term Goal 5: New Goal 07/08/12:  Patients wound size will decrease to 3cmx.5cm with a .25 cm depth. Additional Short Term Goals?: Yes Short Term Goal 6: New Goal 07/08/12:  Patient will be educated on precautions and contraindications of wound care. Long Term Goals Time to Complete Long Term Goals: 8 weeks Long Term Goal 1: Patient will use left hand actively with all daily activities.  Long Term Goal 2: Patient will decrease edema in her left hand by 2 cm for increased mobility in left hand during functional activtiies. Long Term Goal 3: Patient will increase AAROM in her left hand to [facility] for use of left hand with grasping items when completing household chores.   Long Term Goal 4: Assess left wrist AROM, strength, grip strength, pinch strength, and coordination when indicated by MD. Long Term Goal 5: New goal 07/08/12:  Patients wound will be 100% healed with 100% ganualation.   Problem List Patient Active Problem List  Diagnosis  . ANEMIA  . BIPOLAR DISORDER UNSPECIFIED  . ANXIETY, CHRONIC  . NICOTINE ADDICTION  . CUBITAL TUNNEL SYNDROME  . ACUTE SINUSITIS, UNSPECIFIED  . ACUTE BRONCHITIS  . Pain in Joint, Upper Arm  . NECK PAIN, CHRONIC  . LATERAL EPICONDYLITIS  . DIARRHEA NOS  . RUQ PAIN  . ABDOMINAL PAIN, GENERALIZED  . ABDOMINAL PAIN, LOWER  . ABNORMAL TRANSAMINASE, (LFT'S)  . OTHER ABNORMAL BLOOD CHEMISTRY  . CLOSED FRACTURE OF SURGICAL NECK OF HUMERUS  . LIVER FUNCTION TESTS, ABNORMAL, HX  OF  . Ankle sprain  . Radius distal fracture  . Wrist pain, left  . Edema of hand    End of Session Activity Tolerance: Patient tolerated treatment well General Behavior During Session: [facility] for tasks performed Cognition: [facility] for tasks performed  GO    Shirlean Mylar, OTR/L  07/25/2012, 9:26 AM

## 2012-07-27 ENCOUNTER — Inpatient Hospital Stay (HOSPITAL_COMMUNITY): Admission: RE | Admit: 2012-07-27 | Payer: Medicaid Other | Source: Ambulatory Visit | Admitting: Specialist

## 2012-07-28 ENCOUNTER — Ambulatory Visit (HOSPITAL_COMMUNITY)
Admission: RE | Admit: 2012-07-28 | Discharge: 2012-07-28 | Disposition: A | Discharge status: Home / Self Care | Payer: Medicaid Other | Source: Ambulatory Visit | Attending: Orthopedic Surgery | Admitting: Orthopedic Surgery

## 2012-07-28 ENCOUNTER — Ambulatory Visit (INDEPENDENT_AMBULATORY_CARE_PROVIDER_SITE_OTHER): Payer: Medicaid Other | Admitting: Orthopedic Surgery

## 2012-07-28 VITALS — BP 94/80 | Ht 65.5 in | Wt 136.0 lb

## 2012-07-28 DIAGNOSIS — R609 Edema, unspecified: Secondary | ICD-10-CM

## 2012-07-28 DIAGNOSIS — R600 Localized edema: Secondary | ICD-10-CM

## 2012-07-28 DIAGNOSIS — S52599A Other fractures of lower end of unspecified radius, initial encounter for closed fracture: Secondary | ICD-10-CM

## 2012-07-28 DIAGNOSIS — M25532 Pain in left wrist: Secondary | ICD-10-CM

## 2012-07-28 DIAGNOSIS — S52509A Unspecified fracture of the lower end of unspecified radius, initial encounter for closed fracture: Secondary | ICD-10-CM

## 2012-07-28 NOTE — Progress Notes (Signed)
Occupational Therapy Treatment Patient Details  Name: Crystal Stein MRN: [id number] Date of Birth: [birth year]  Today's Date: 07/28/2012 Time: 0945-1000 OT Time Calculation (min): 15 min Wound Debridement 15' Visit#: 11  of 18   Re-eval: 08/25/12    Authorization: medicaid visits utilized has signed responsibility of payment form     Subjective S:  I went to [place] on Monday and was discharged yesterday due to dehydration. My hand got swollen because of the fluids they gave me.   Pain Assessment Currently in Pain?: No/denies Pain Score: 0-No pain  Precautions/Restrictions   Hand AROM only  Exercise/Treatments    Manual Therapy Other Manual Therapy: Wound size is .3 cm by 1.0 cm in length.  Wound has minimal yellow slough and wound bed is beginning to granulate.  OT debrided wound and surrounding area, removing dead/dry skin, removed minimal sot yellow slough with forceps.  Applied medihoney gel to wound bed, vaseline toentire surgical scar and wound area.  Placed moistened 2 x 2 over wound bed and wrapped with gauze.    Occupational Therapy Assessment and Plan OT Assessment and Plan Clinical Impression Statement: A:  Wound size has decreased to .3 cm x 1.0 cm.  Full A/PROM of digits, 0 edema. OT Frequency: Min 2X/week OT Duration: 4 weeks OT Plan: P:  Continue treatment of wound until 100% healed, assess Wrist ROM, strength, hand strength and coordination once indicated by MD.   Goals Short Term Goals Time to Complete Short Term Goals: 4 weeks Short Term Goal 1: Patient will be educated on HEP for left hand AAROM and edema control. Short Term Goal 1 Progress: Met Short Term Goal 2: Patient will decrease edema in her left hand by .75 cm for increased ability to move her hand during functional activities.   Short Term Goal 2 Progress: Met Short Term Goal 3: Fabricate and educate patient on use of left wrist extension and IF and LF gutter splints.   Short Term Goal 3  Progress: Met Short Term Goal 4: Patient will increase PROM of digit flexion and extension in left hand to [facility] for increased ability to use left hand as an active assist during functional activities.  Short Term Goal 4 Progress: Met Short Term Goal 5: New Goal 07/08/12:  Patients wound size will decrease to 3cmx.5cm with a .25 cm depth. Short Term Goal 5 Progress: Met Additional Short Term Goals?: Yes Short Term Goal 6: New Goal 07/08/12:  Patient will be educated on precautions and contraindications of wound care. Short Term Goal 6 Progress: Met Long Term Goals Time to Complete Long Term Goals: 8 weeks Long Term Goal 1: Patient will use left hand actively with all daily activities.  Long Term Goal 2: Patient will decrease edema in her left hand by 2 cm for increased mobility in left hand during functional activtiies. Long Term Goal 2 Progress: Met Long Term Goal 3: Patient will increase AAROM in her left hand to [facility] for use of left hand with grasping items when completing household chores.   Long Term Goal 3 Progress: Met Long Term Goal 4: Assess left wrist AROM, strength, grip strength, pinch strength, and coordination when indicated by MD. Long Term Goal 4 Progress: Not met Long Term Goal 5: New goal 07/08/12:  Patients wound will be 100% healed with 100% ganualation.  Long Term Goal 5 Progress: Not met  Problem List Patient Active Problem List  Diagnosis  . ANEMIA  . BIPOLAR DISORDER UNSPECIFIED  . ANXIETY,  CHRONIC  . NICOTINE ADDICTION  . CUBITAL TUNNEL SYNDROME  . ACUTE SINUSITIS, UNSPECIFIED  . ACUTE BRONCHITIS  . Pain in Joint, Upper Arm  . NECK PAIN, CHRONIC  . LATERAL EPICONDYLITIS  . DIARRHEA NOS  . RUQ PAIN  . ABDOMINAL PAIN, GENERALIZED  . ABDOMINAL PAIN, LOWER  . ABNORMAL TRANSAMINASE, (LFT'S)  . OTHER ABNORMAL BLOOD CHEMISTRY  . CLOSED FRACTURE OF SURGICAL NECK OF HUMERUS  . LIVER FUNCTION TESTS, ABNORMAL, HX OF  . Ankle sprain  . Radius distal fracture  . Wrist  pain, left  . Edema of hand    End of Session Activity Tolerance: Patient tolerated treatment well General Behavior During Session: [facility] for tasks performed Cognition: [facility] for tasks performed  Shirlean Mylar, OTR/L  07/28/2012, 10:09 AM

## 2012-07-28 NOTE — Progress Notes (Signed)
Patient ID: Crystal Stein, female   DOB: December 19, 1970, 43 y.o.   MRN: [id number] Chief Complaint  Patient presents with  . Follow-up    post op left wrist OTIF DOS 06/17/12    Status post DVR plate left wrist using a volar approach to the carpal tunnel still has some carpal tunnel symptoms from the metacarpophalangeal joints distally. Small area of the wound is still open with clear serous drainage. It's less than 5 mm in length and width.  She's regained almost all of her finger flexion including the IP joints and the metacarpophalangeal joint  We are going to wait on further therapy and letter work on exercises at home until the wound closes and then we can resolving with strengthening exercises and wrist range of motion exercises  X-ray in 2 weeks

## 2012-08-02 ENCOUNTER — Ambulatory Visit (HOSPITAL_COMMUNITY): Payer: Medicaid Other | Admitting: Specialist

## 2012-08-05 ENCOUNTER — Ambulatory Visit (HOSPITAL_COMMUNITY): Payer: Medicaid Other | Admitting: Specialist

## 2012-08-08 ENCOUNTER — Telehealth: Payer: Self-pay | Admitting: Orthopedic Surgery

## 2012-08-08 NOTE — Telephone Encounter (Signed)
Advised the patient of doctor's reply °

## 2012-08-08 NOTE — Telephone Encounter (Signed)
Crystal Stein asked if she can resume therapy or do you want her to wait until after her next appointment with you on 08/15/12

## 2012-08-08 NOTE — Telephone Encounter (Signed)
Wait until wound closes

## 2012-08-15 ENCOUNTER — Ambulatory Visit (INDEPENDENT_AMBULATORY_CARE_PROVIDER_SITE_OTHER): Payer: Medicaid Other | Admitting: Orthopedic Surgery

## 2012-08-15 ENCOUNTER — Encounter: Payer: Self-pay | Admitting: Orthopedic Surgery

## 2012-08-15 ENCOUNTER — Ambulatory Visit (INDEPENDENT_AMBULATORY_CARE_PROVIDER_SITE_OTHER): Payer: Medicaid Other

## 2012-08-15 VITALS — BP 102/68 | Ht 65.5 in | Wt 136.0 lb

## 2012-08-15 DIAGNOSIS — S62102D Fracture of unspecified carpal bone, left wrist, subsequent encounter for fracture with routine healing: Secondary | ICD-10-CM

## 2012-08-15 DIAGNOSIS — S5290XD Unspecified fracture of unspecified forearm, subsequent encounter for closed fracture with routine healing: Secondary | ICD-10-CM

## 2012-08-15 DIAGNOSIS — S62109A Fracture of unspecified carpal bone, unspecified wrist, initial encounter for closed fracture: Secondary | ICD-10-CM | POA: Insufficient documentation

## 2012-08-15 NOTE — Progress Notes (Signed)
Patient ID: Crystal Stein, female   DOB: 16-Aug-1970, 42 y.o.   MRN: [id number] Chief Complaint  Patient presents with  . Follow-up    2 week recheck on left wrist with xray. DOS 06-17-12.   BP 102/68  Ht 5' 5.5" (1.664 m)  Wt 136 lb (61.689 kg)  BMI 22.28 kg/m2  LMP 04/23/2011  Wrist fracture, left, with routine healing, subsequent encounter - Plan: DG Wrist Complete Left, Ambulatory referral to Occupational Therapy   Patient presents back for her 8 week followup x-ray and check of her wound which opened up and was treated with antibiotics and wound care. We delayed her therapy until the wound closed.  Her x-ray shows excellent healing and alignment of the fracture. The hardware is in good position.  The wound is healed there is no surrounding erythema she can make a full fist she does need some strengthening exercises to regain full function  Recommend followup in 4 weeks she can resume occupational therapy  X-ray report 3 views of the operative left wrist  A plate and screw construct are seen fixating a distal radius fracture. The fracture is aligned in appropriate manner with intact hardware in good position  Impression healing distal radius fracture left wrist

## 2012-08-15 NOTE — Patient Instructions (Signed)
Resume OT

## 2012-08-19 ENCOUNTER — Ambulatory Visit (HOSPITAL_COMMUNITY)
Admission: RE | Admit: 2012-08-19 | Discharge: 2012-08-19 | Disposition: A | Discharge status: Home / Self Care | Payer: Medicaid Other | Source: Ambulatory Visit | Attending: Orthopedic Surgery | Admitting: Orthopedic Surgery

## 2012-08-19 DIAGNOSIS — M25532 Pain in left wrist: Secondary | ICD-10-CM

## 2012-08-19 DIAGNOSIS — S52502G Unspecified fracture of the lower end of left radius, subsequent encounter for closed fracture with delayed healing: Secondary | ICD-10-CM

## 2012-08-19 DIAGNOSIS — R600 Localized edema: Secondary | ICD-10-CM

## 2012-08-19 DIAGNOSIS — S62102G Fracture of unspecified carpal bone, left wrist, subsequent encounter for fracture with delayed healing: Secondary | ICD-10-CM

## 2012-08-19 DIAGNOSIS — M6281 Muscle weakness (generalized): Secondary | ICD-10-CM | POA: Insufficient documentation

## 2012-08-19 NOTE — Evaluation (Signed)
Occupational Therapy Evaluation  Patient Details  Name: Crystal Stein MRN: [id number] Date of Birth: [birth year]  Today's Date: 08/19/2012 Time: 7829-5621 OT Time Calculation (min): 40 min OT Evaluation 40' Visit#: 1 of 12  Re-eval: 09/16/12   Diagnosis: S/P Left Distal Radius Fracture with ORIF  Surgical Date: 06/17/12 Prior Therapy: for hand ROM and wound care  Authorization: medicaid visits utilized has signed responsibility of payment form    Past Medical History:  Past Medical History  Diagnosis Date  . Anxiety   . Depression   . Asthma   . 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 COPD (chronic obstructive pulmonary disease)   . GERD (gastroesophageal reflux disease)   . Bipolar disorder   . Blood transfusion 2006   Past Surgical History:  Past Surgical History  Procedure Laterality Date  . Tennis elbow release  04/27/06    left; Romeo Apple, [facility]  . Multiple tooth extractions  10/07    full upper  . Orif proximal humerus fracture  08/06    left,  . Dilation and curettage of uterus  June 2012    failed ablation  . Tubal ligation  June 2012    [facility]  . Bipolar disorder    . Hysteroscopy  01/27/2011    Procedure: HYSTEROSCOPY;  Surgeon: Tilda Burrow, MD;  Location: [facility];  Service: Gynecology;  Laterality: N/A;  . Vaginal hysterectomy  05/19/2011    Procedure: HYSTERECTOMY VAGINAL;  Surgeon: Tilda Burrow, MD;  Location: [facility];  Service: Gynecology;  Laterality: N/A;  . Orif wrist fracture  06/17/2012    Procedure: OPEN REDUCTION INTERNAL FIXATION (ORIF) WRIST FRACTURE;  Surgeon: Vickki Hearing, MD;  Location: [facility];  Service: Orthopedics;  Laterality: Left;  . Carpal tunnel release  06/17/2012    Procedure: CARPAL TUNNEL RELEASE;  Surgeon: Vickki Hearing, MD;  Location: [facility];  Service: Orthopedics;  Laterality: Left;    Subjective S:  Dr. Romeo Apple said I am ready for more therapy. Pertinent History: [name] states that  she tripped and fell, suffering a left distal radius fracture on 06/12/12.  She went to the ER and followed up with Dr. Romeo Apple.  She had surgery on 06/17/12 to repair the fracture. She received therapy for digit range of motion and wound care for her surgical incision from 07/01/12-07/28/12.  She has been re-referred to occupational therapy today for evaluation and treatment of her left wrist and hand.   Limitations: n/a Patient Stated Goals: "I want to be able to use my hand and get it back to normal." Pain Assessment Currently in Pain?: Yes Pain Score:   7 Pain Location: Wrist Pain Orientation: Left Pain Type: Acute pain   Prior Functioning  Home Living Lives With: Family Prior Function Level of Independence: Independent with basic ADLs;Independent with homemaking with ambulation Driving: Yes Vocation: Unemployed Leisure: Hobbies-yes (Comment) Comments: spending time with family  Assessment ADL/Vision/Perception ADL ADL Comments: Using her left hand to pick up grocery bags, push up from a chair, and gripping household objects is difficult for patient. Dominant Hand: Right  Cognition/Observation Cognition Orientation Level: Oriented X4 Observation/Other Assessments Observations: 10 cm healed surgical incision over volar wrist region  Sensation/Coordination/Edema Sensation Light Touch: Impaired Detail Light Touch Impaired Details: Impaired LUE Additional Comments: light touch assessed and found to be dulled in entire left hand and wrist region.   Coordination Gross Motor Movements are Fluid and Coordinated: Yes Fine Motor Movements are Fluid  and Coordinated: No 9 Hole Peg Test: right 25.0" left 41.0"  Additional Assessments LUE AROM (degrees) Left Forearm Pronation: 64 Degrees Left Forearm Supination: 84 Degrees Left Wrist Extension: 30 Degrees Left Wrist Flexion: 28 Degrees Left Wrist Radial Deviation: 14 Degrees Left Wrist Ulnar Deviation: 14 Degrees Left  Composite Finger Extension:  (WFL) Left Composite Finger Flexion:  (WFL) LUE Strength LUE Overall Strength Comments: strength not assessed in forearm and wrist due to recent fracture Grip (lbs): 15 (40) Lateral Pinch: 6 lbs (14) 3 Point Pinch: 4 lbs (11) Palpation Palpation: moderate fascial restrictions in flexor and extensor forearm, wrist, and hand.  Scar region has max restrictions Left Hand Strength - Pinch (lbs) Lateral Pinch: 6 lbs (14) 3 Point Pinch: 4 lbs (11)   Exercise/Treatments    Manual Therapy Manual Therapy: Myofascial release Myofascial Release: MFR and manual stretching to flexor and extensor forearm, wrist, and hand with gentle stretching through available range at each joint.  Scar release to surgical scar.  Occupational Therapy Assessment and Plan OT Assessment and Plan Clinical Impression Statement: A:  [name] has been reevaluated for skilled OT services to include wrist and forearm AROM.  Her current deficitis include decreased mobility, strength, coordination, sensation, and increased pain, fascial, and scar restrictions.  These deficits are effecting her ability to use her LUE with her daily activities.   Pt will benefit from skilled therapeutic intervention in order to improve on the following deficits: Decreased range of motion;Decreased strength;Increased fascial restricitons;Impaired sensation;Impaired flexibility;Increased muscle spasms;Pain Rehab Potential: Good OT Frequency: Min 2X/week OT Duration: 6 weeks OT Treatment/Interventions: Self-care/ADL training;Therapeutic exercise;Manual therapy;Therapeutic activities;Patient/family education OT Plan: P:  Skilled OT intervention 2 times a week x 6 weeks to decrease pain and fascial restrictions and increase pain free mobility, strength, coordination, and sensation for increased use of LUE with all daily activities.  Treatment Plan:  MFR and manual stretching to left flexor and extensor forearm, wrist, and  hand.  AROM, strengthening, coordination, and functional exercises for left forearm, wrist, and hand.    Goals Short Term Goals Time to Complete Short Term Goals: 3 weeks Short Term Goal 1: Patient will be educated on a HEP. Short Term Goal 1 Progress: Progressing toward goal Short Term Goal 2: Patient will increase AROM by 10 degrees in her left forearm and wrist for greater ability to push herself out of a chair. Short Term Goal 2 Progress: Progressing toward goal Short Term Goal 3: Patient will increase left grip strength by 10 pounds and pinch strength by 2 pounds for increased ability to open pop bottles. Short Term Goal 3 Progress: Progressing toward goal Short Term Goal 4: Patient will decrease pain to 5/10 in her left forearm and wrist for greater functional use of her left hand. Short Term Goal 4 Progress: Progressing toward goal Short Term Goal 5: Patient will decrease fascial and scar restrictions to min-mod in for greater mobility needed when cooking and cleaning.  Short Term Goal 5 Progress: Progressing toward goal Short Term Goal 6: Patient will improve [facility] by decreasing completion time on Nine Hole Peg Test by 10" for increased independence fastening buttons. Short Term Goal 6 Progress: Progressing toward goal Long Term Goals Time to Complete Long Term Goals: 6 weeks Long Term Goal 1: Patient will return to prior level of independence with all daily and leisure activities.  Long Term Goal 1 Progress: Progressing toward goal Long Term Goal 2: Patient will have WNL AROM in her left wrist and forearm  for greater independence with lifting groceries. Long Term Goal 2 Progress: Progressing toward goal Long Term Goal 3: Patient will increase left upper extremity strength to 5/5 for increased ability to maneuver a vacuum cleaner. Long Term Goal 3 Progress: Progressing toward goal Long Term Goal 4: Patient will increase left grip strength by 25# and pinch strength by 7 pounds for  increased ability to open containers. Long Term Goal 4 Progress: Progressing toward goal Long Term Goal 5: Patient will decrease fascial restrictions to minimal in her left upper extremity for greater mobiilty needed to complete daily activities.  Long Term Goal 5 Progress: Progressing toward goal Additional Long Term Goals?: Yes Long Term Goal 6: Patient will decrease pain to 2/10 in her left arm for greater I with daily activities.  Long Term Goal 6 Progress: Progressing toward goal Long Term Goal 7: Patient will improve [facility] by decreasing completion time on Nine Hole Peg Test to 25" for increased independence fastening jewelery. Long Term Goal 8: Patient will improve light touch sensation to diminished protective sensation from dulled sensation.   Problem List Patient Active Problem List  Diagnosis  . ANEMIA  . BIPOLAR DISORDER UNSPECIFIED  . ANXIETY, CHRONIC  . NICOTINE ADDICTION  . CUBITAL TUNNEL SYNDROME  . ACUTE SINUSITIS, UNSPECIFIED  . ACUTE BRONCHITIS  . Pain in Joint, Upper Arm  . NECK PAIN, CHRONIC  . LATERAL EPICONDYLITIS  . DIARRHEA NOS  . RUQ PAIN  . ABDOMINAL PAIN, GENERALIZED  . ABDOMINAL PAIN, LOWER  . ABNORMAL TRANSAMINASE, (LFT'S)  . OTHER ABNORMAL BLOOD CHEMISTRY  . CLOSED FRACTURE OF SURGICAL NECK OF HUMERUS  . LIVER FUNCTION TESTS, ABNORMAL, HX OF  . Ankle sprain  . Radius distal fracture  . Wrist pain, left  . Edema of hand  . Wrist fracture  . Muscle weakness (generalized)    End of Session Activity Tolerance: Patient tolerated treatment well General Cognition: WFL for tasks performed OT Plan of Care OT Home Exercise Plan: Educated patient on HEP for wrist, forearm AROM, tendon glides, and grip strengthening with yellow theraputty.  Patient able to demonstrated each exercise for therapist in clinic.  Consulted and Agree with Plan of Care: Patient  Shirlean Mylar, OTR/L  08/19/2012, 1:33 PM  Physician Documentation Your signature is  required to indicate approval of the treatment plan as stated above.  Please sign and either send electronically or make a copy of this report for your files and return this physician signed original.  Please mark one 1.__approve of plan  2. ___approve of plan with the following conditions.   ______________________________                                                          _____________________ Physician Signature  Date  

## 2012-08-24 ENCOUNTER — Ambulatory Visit (HOSPITAL_COMMUNITY)
Admission: RE | Admit: 2012-08-24 | Discharge: 2012-08-24 | Disposition: A | Discharge status: Home / Self Care | Payer: Medicaid Other | Source: Ambulatory Visit | Attending: Orthopedic Surgery | Admitting: Orthopedic Surgery

## 2012-08-24 DIAGNOSIS — Y838 Other surgical procedures as the cause of abnormal reaction of the patient, or of later complication, without mention of misadventure at the time of the procedure: Secondary | ICD-10-CM | POA: Insufficient documentation

## 2012-08-24 DIAGNOSIS — M25539 Pain in unspecified wrist: Secondary | ICD-10-CM | POA: Insufficient documentation

## 2012-08-24 DIAGNOSIS — M25639 Stiffness of unspecified wrist, not elsewhere classified: Secondary | ICD-10-CM | POA: Insufficient documentation

## 2012-08-24 DIAGNOSIS — R600 Localized edema: Secondary | ICD-10-CM

## 2012-08-24 DIAGNOSIS — T8140XA Infection following a procedure, unspecified, initial encounter: Secondary | ICD-10-CM | POA: Insufficient documentation

## 2012-08-24 DIAGNOSIS — M25532 Pain in left wrist: Secondary | ICD-10-CM

## 2012-08-24 DIAGNOSIS — M6281 Muscle weakness (generalized): Secondary | ICD-10-CM

## 2012-08-24 DIAGNOSIS — IMO0001 Reserved for inherently not codable concepts without codable children: Secondary | ICD-10-CM | POA: Insufficient documentation

## 2012-08-24 NOTE — Progress Notes (Signed)
Occupational Therapy Treatment Patient Details  Name: Crystal Stein MRN: [id number] Date of Birth: 1971/02/24  Today's Date: 08/24/2012 Time: 4696-2952 OT Time Calculation (min): 40 min Manual Therapy 8413-2440 16' Therapeutic Exercises [phone] 24' Visit#: 2 of 12  Re-eval: 09/16/12    Authorization: medicaid visits utilized has signed responsibility of payment form   Authorization Time Period: n/a  Authorization Visit#:   of    Subjective  S:  Ive been massaging my wrist.  Precautions/Restrictions   progress as tolerated.  Exercise/Treatments Wrist Exercises Forearm Supination: PROM;Strengthening;10 reps Bar Weights/Barbell (Forearm Supination): 1 lb Forearm Pronation: PROM;Strengthening;10 reps Bar Weights/Barbell (Forearm Pronation): 1 lb Wrist Flexion: PROM;Strengthening;10 reps Bar Weights/Barbell (Wrist Flexion): 1 lb Wrist Extension: PROM;Strengthening;10 reps Bar Weights/Barbell (Wrist Extension): 1 lb Wrist Radial Deviation: PROM;Strengthening;10 reps (1 pounds) Wrist Ulnar Deviation: PROM;Strengthening;10 reps (1 pound)   Sponges: 8, 9, 10 Theraputty: Roll;Flatten;Grip;Locate Pegs Theraputty - Flatten: pink Theraputty - Roll: pink Theraputty - Grip: pink Theraputty - Locate Pegs: pink 10  Wrist Weighted Stretch Supination - Weighted Stretch: 2 pounds;60 seconds Wrist Flexion - Weighted Stretch: 2 pounds;60 seconds Wrist Extension - Weighted Stretch: 2 pounds;60 seconds Radial Deviation - Weighted Stretch: 2 pounds;60 seconds  Fine Motor Coordination Supination - Weighted Stretch: 2 pounds;60 seconds Wrist Flexion - Weighted Stretch: 2 pounds;60 seconds Wrist Extension - Weighted Stretch: 2 pounds;60 seconds Radial Deviation - Weighted Stretch: 2 pounds;60 seconds     Manual Therapy Manual Therapy: Myofascial release Myofascial Release: MFR and manual stretching to flexor and extensor forearm, wrist, and hand with gentle stretching through available  range at each joint. Scar release to surgical scar.  Occupational Therapy Assessment and Plan OT Assessment and Plan Clinical Impression Statement: A:  Demonstrated WFL AROM and PROM this date, therefore initiated strengthening this date.   OT Plan: P:  Increase stretch length with weighted wrist stretch, increase to 2# with wrist strengthening.     Goals Short Term Goals Time to Complete Short Term Goals: 3 weeks Short Term Goal 1: Patient will be educated on a HEP. Short Term Goal 1 Progress: Progressing toward goal Short Term Goal 2: Patient will increase AROM by 10 degrees in her left forearm and wrist for greater ability to push herself out of a chair. Short Term Goal 2 Progress: Progressing toward goal Short Term Goal 3: Patient will increase left grip strength by 10 pounds and pinch strength by 2 pounds for increased ability to open pop bottles. Short Term Goal 3 Progress: Progressing toward goal Short Term Goal 4: Patient will decrease pain to 5/10 in her left forearm and wrist for greater functional use of her left hand. Short Term Goal 4 Progress: Progressing toward goal Short Term Goal 5: Patient will decrease fascial and scar restrictions to min-mod in for greater mobility needed when cooking and cleaning.  Short Term Goal 5 Progress: Progressing toward goal Short Term Goal 6: Patient will improve [facility] by decreasing completion time on Nine Hole Peg Test by 10" for increased independence fastening buttons. Short Term Goal 6 Progress: Progressing toward goal Long Term Goals Time to Complete Long Term Goals: 6 weeks Long Term Goal 1: Patient will return to prior level of independence with all daily and leisure activities.  Long Term Goal 1 Progress: Progressing toward goal Long Term Goal 2: Patient will have WNL AROM in her left wrist and forearm for greater independence with lifting groceries. Long Term Goal 2 Progress: Progressing toward goal Long Term Goal 3: Patient will  increase left upper extremity strength to 5/5 for increased ability to maneuver a vacuum cleaner. Long Term Goal 3 Progress: Progressing toward goal Long Term Goal 4: Patient will increase left grip strength by 25# and pinch strength by 7 pounds for increased ability to open containers. Long Term Goal 4 Progress: Progressing toward goal Long Term Goal 5: Patient will decrease fascial restrictions to minimal in her left upper extremity for greater mobiilty needed to complete daily activities.  Long Term Goal 5 Progress: Progressing toward goal Additional Long Term Goals?: Yes Long Term Goal 6: Patient will decrease pain to 2/10 in her left arm for greater I with daily activities.  Long Term Goal 6 Progress: Progressing toward goal Long Term Goal 7: Patient will improve [facility] by decreasing completion time on Nine Hole Peg Test to 25" for increased independence fastening jewelery. Long Term Goal 7 Progress: Progressing toward goal Long Term Goal 8: Patient will improve light touch sensation to diminished protective sensation from dulled sensation.  Long Term Goal 8 Progress: Progressing toward goal  Problem List Patient Active Problem List  Diagnosis  . ANEMIA  . BIPOLAR DISORDER UNSPECIFIED  . ANXIETY, CHRONIC  . NICOTINE ADDICTION  . CUBITAL TUNNEL SYNDROME  . ACUTE SINUSITIS, UNSPECIFIED  . ACUTE BRONCHITIS  . Pain in Joint, Upper Arm  . NECK PAIN, CHRONIC  . LATERAL EPICONDYLITIS  . DIARRHEA NOS  . RUQ PAIN  . ABDOMINAL PAIN, GENERALIZED  . ABDOMINAL PAIN, LOWER  . ABNORMAL TRANSAMINASE, (LFT'S)  . OTHER ABNORMAL BLOOD CHEMISTRY  . CLOSED FRACTURE OF SURGICAL NECK OF HUMERUS  . LIVER FUNCTION TESTS, ABNORMAL, HX OF  . Ankle sprain  . Radius distal fracture  . Wrist pain, left  . Edema of hand  . Wrist fracture  . Muscle weakness (generalized)    End of Session Activity Tolerance: Patient tolerated treatment well General Behavior During Session: [facility] for tasks  performed Cognition: [facility] for tasks performed   Shirlean Mylar, OTR/L  08/24/2012, 3:06 PM

## 2012-08-26 ENCOUNTER — Ambulatory Visit (HOSPITAL_COMMUNITY): Payer: Medicaid Other | Admitting: Specialist

## 2012-08-30 ENCOUNTER — Ambulatory Visit (HOSPITAL_COMMUNITY)
Admission: RE | Admit: 2012-08-30 | Discharge: 2012-08-30 | Disposition: A | Discharge status: Home / Self Care | Payer: Medicaid Other | Source: Ambulatory Visit | Attending: Orthopedic Surgery | Admitting: Orthopedic Surgery

## 2012-08-30 DIAGNOSIS — M6281 Muscle weakness (generalized): Secondary | ICD-10-CM

## 2012-08-30 DIAGNOSIS — M25532 Pain in left wrist: Secondary | ICD-10-CM

## 2012-08-30 NOTE — Progress Notes (Signed)
Occupational Therapy Treatment Patient Details  Name: Crystal Stein MRN: [id number] Date of Birth: February 13, 1971  Today's Date: 08/30/2012 Time: 4098-1191 OT Time Calculation (min): 42 min Manual Therapy 900-910 10' Therapeutic Exercises [phone] 42' Visit#: 3 of 12  Re-eval: 09/16/12    Authorization: medicaid visits utilized has signed responsibility of payment form   Authorization Time Period: n/a  Authorization Visit#:   of    Subjective S:  It feels pretty good.  There is one spot on my scar that is hypersensitive and my fingers are still numb. Pain Assessment Currently in Pain?: No/denies Pain Score: 0-No pain  Precautions/Restrictions   progress as tolerated  Exercise/Treatments Wrist Exercises Forearm Supination: PROM;Strengthening;10 reps Bar Weights/Barbell (Forearm Supination): 2 lbs Forearm Pronation: PROM;Strengthening;10 reps Bar Weights/Barbell (Forearm Pronation): 2 lbs Wrist Flexion: PROM;Strengthening;10 reps Bar Weights/Barbell (Wrist Flexion): 2 lbs Wrist Extension: PROM;Strengthening;10 reps Bar Weights/Barbell (Wrist Extension): 2 lbs Wrist Radial Deviation: PROM;Strengthening;10 reps (2 pounds) Wrist Ulnar Deviation: PROM;Strengthening;10 reps (2 pounds)   Sponges: 7, 8, 10, 11 Grooved pegboard  without difficulty Theraputty: Roll;Flatten;Grip;Locate Pegs Theraputty - Flatten: pink Theraputty - Roll: pink Theraputty - Grip: pink Theraputty - Locate Pegs: pink 10 Hand Gripper with Large Beads: 10, attempted medium and small beads, and grip is too weak to grip them  Wrist Weighted Stretch Weighted Stretch:  (dc all stretches)      Manual Therapy Manual Therapy: Myofascial release Myofascial Release: MFR and manual stretching to flexor and extensor forearm, wrist, and hand with gentle stretching through available range at each joint. Scar release to surgical scar.  Occupational Therapy Assessment and Plan OT Assessment and Plan Clinical  Impression Statement: A:  Increased to 2# resist with strengthening for wrist.  OT Plan: P:  Increase to green mod resist theraputty in order to increase wrist and hand/digit strength.   Goals Short Term Goals Time to Complete Short Term Goals: 3 weeks Short Term Goal 1: Patient will be educated on a HEP. Short Term Goal 1 Progress: Met Short Term Goal 2: Patient will increase AROM by 10 degrees in her left forearm and wrist for greater ability to push herself out of a chair. Short Term Goal 2 Progress: Progressing toward goal Short Term Goal 3: Patient will increase left grip strength by 10 pounds and pinch strength by 2 pounds for increased ability to open pop bottles. Short Term Goal 3 Progress: Progressing toward goal Short Term Goal 4: Patient will decrease pain to 5/10 in her left forearm and wrist for greater functional use of her left hand. Short Term Goal 4 Progress: Progressing toward goal Short Term Goal 5: Patient will decrease fascial and scar restrictions to min-mod in for greater mobility needed when cooking and cleaning.  Short Term Goal 5 Progress: Progressing toward goal Short Term Goal 6: Patient will improve [facility] by decreasing completion time on Nine Hole Peg Test by 10" for increased independence fastening buttons. Short Term Goal 6 Progress: Progressing toward goal Long Term Goals Time to Complete Long Term Goals: 6 weeks Long Term Goal 1: Patient will return to prior level of independence with all daily and leisure activities.  Long Term Goal 1 Progress: Progressing toward goal Long Term Goal 2: Patient will have WNL AROM in her left wrist and forearm for greater independence with lifting groceries. Long Term Goal 2 Progress: Progressing toward goal Long Term Goal 3: Patient will increase left upper extremity strength to 5/5 for increased ability to maneuver a vacuum cleaner. Long Term Goal  3 Progress: Progressing toward goal Long Term Goal 4: Patient will increase  left grip strength by 25# and pinch strength by 7 pounds for increased ability to open containers. Long Term Goal 4 Progress: Progressing toward goal Long Term Goal 5: Patient will decrease fascial restrictions to minimal in her left upper extremity for greater mobiilty needed to complete daily activities.  Long Term Goal 5 Progress: Progressing toward goal Additional Long Term Goals?: Yes Long Term Goal 6: Patient will decrease pain to 2/10 in her left arm for greater I with daily activities.  Long Term Goal 6 Progress: Progressing toward goal Long Term Goal 7: Patient will improve [facility] by decreasing completion time on Nine Hole Peg Test to 25" for increased independence fastening jewelery. Long Term Goal 7 Progress: Progressing toward goal Long Term Goal 8: Patient will improve light touch sensation to diminished protective sensation from dulled sensation.  Long Term Goal 8 Progress: Progressing toward goal  Problem List Patient Active Problem List  Diagnosis  . ANEMIA  . BIPOLAR DISORDER UNSPECIFIED  . ANXIETY, CHRONIC  . NICOTINE ADDICTION  . CUBITAL TUNNEL SYNDROME  . ACUTE SINUSITIS, UNSPECIFIED  . ACUTE BRONCHITIS  . Pain in Joint, Upper Arm  . NECK PAIN, CHRONIC  . LATERAL EPICONDYLITIS  . DIARRHEA NOS  . RUQ PAIN  . ABDOMINAL PAIN, GENERALIZED  . ABDOMINAL PAIN, LOWER  . ABNORMAL TRANSAMINASE, (LFT'S)  . OTHER ABNORMAL BLOOD CHEMISTRY  . CLOSED FRACTURE OF SURGICAL NECK OF HUMERUS  . LIVER FUNCTION TESTS, ABNORMAL, HX OF  . Ankle sprain  . Radius distal fracture  . Wrist pain, left  . Edema of hand  . Wrist fracture  . Muscle weakness (generalized)    End of Session Activity Tolerance: Patient tolerated treatment well General Behavior During Session: [facility] for tasks performed Cognition: [facility] for tasks performed   Shirlean Mylar, OTR/L  08/30/2012, 10:44 AM

## 2012-09-01 ENCOUNTER — Ambulatory Visit (HOSPITAL_COMMUNITY)
Admission: RE | Admit: 2012-09-01 | Discharge: 2012-09-01 | Disposition: A | Discharge status: Home / Self Care | Payer: Medicaid Other | Source: Ambulatory Visit | Attending: Orthopedic Surgery | Admitting: Orthopedic Surgery

## 2012-09-01 DIAGNOSIS — R600 Localized edema: Secondary | ICD-10-CM

## 2012-09-01 DIAGNOSIS — M25532 Pain in left wrist: Secondary | ICD-10-CM

## 2012-09-01 DIAGNOSIS — M6281 Muscle weakness (generalized): Secondary | ICD-10-CM

## 2012-09-01 NOTE — Progress Notes (Signed)
Occupational Therapy Treatment Patient Details  Name: Crystal Stein MRN: [id number] Date of Birth: 15-Mar-1971  Today's Date: 09/01/2012 Time: 9562-1308 OT Time Calculation (min): 40 min Manual Therapy 10' Splinting 15' HEP/selfcare 5' Visit#: 4 of 12  Re-eval: 09/16/12    Authorization: medicaid visits utilized has signed responsibility of payment form   Authorization Time Period:    Authorization Visit#:   of    Subjective S:  What can we do about the numbness in my fingers (index, long, ring)? Pain Assessment Currently in Pain?: No/denies Pain Score: 0-No pain  Precautions/Restrictions   n/a  Exercise/Treatments     Manual Therapy Manual Therapy: Myofascial release Myofascial Release: Myofascial release and manual stretching to flexor and extensor forearm, wrist, and hand.   Splinting Splinting: In order to alleviate pressure on ulnar nerve at elbow, fabricated volar based elbow extension splint for patient to wear at night.  Educated patient on donning and doffing of splint, contraindication and indications of use.    Occupational Therapy Assessment and Plan OT Assessment and Plan Clinical Impression Statement: A:  OTR/L fabricated a volar based elbow night splint to alleviate pressure on ulnar nerve and educated patient on median nerve stretch and ulnar nerve glide HEP for decreased pressure on nerve and less tingling in hand. OT Plan: P:  Follow up on HEP and splint that were issued this date.    Goals Short Term Goals Time to Complete Short Term Goals: 3 weeks Short Term Goal 1: Patient will be educated on a HEP. Short Term Goal 2: Patient will increase AROM by 10 degrees in her left forearm and wrist for greater ability to push herself out of a chair. Short Term Goal 3: Patient will increase left grip strength by 10 pounds and pinch strength by 2 pounds for increased ability to open pop bottles. Short Term Goal 4: Patient will decrease pain to 5/10 in her left  forearm and wrist for greater functional use of her left hand. Short Term Goal 5: Patient will decrease fascial and scar restrictions to min-mod in for greater mobility needed when cooking and cleaning.  Short Term Goal 6: Patient will improve [facility] by decreasing completion time on Nine Hole Peg Test by 10" for increased independence fastening buttons. Long Term Goals Time to Complete Long Term Goals: 6 weeks Long Term Goal 1: Patient will return to prior level of independence with all daily and leisure activities.  Long Term Goal 2: Patient will have WNL AROM in her left wrist and forearm for greater independence with lifting groceries. Long Term Goal 3: Patient will increase left upper extremity strength to 5/5 for increased ability to maneuver a vacuum cleaner. Long Term Goal 4: Patient will increase left grip strength by 25# and pinch strength by 7 pounds for increased ability to open containers. Long Term Goal 5: Patient will decrease fascial restrictions to minimal in her left upper extremity for greater mobiilty needed to complete daily activities.  Additional Long Term Goals?: Yes Long Term Goal 6: Patient will decrease pain to 2/10 in her left arm for greater I with daily activities.  Long Term Goal 7: Patient will improve [facility] by decreasing completion time on Nine Hole Peg Test to 25" for increased independence fastening jewelery. Long Term Goal 8: Patient will improve light touch sensation to diminished protective sensation from dulled sensation.   Problem List Patient Active Problem List  Diagnosis  . ANEMIA  . BIPOLAR DISORDER UNSPECIFIED  . ANXIETY, CHRONIC  .  NICOTINE ADDICTION  . CUBITAL TUNNEL SYNDROME  . ACUTE SINUSITIS, UNSPECIFIED  . ACUTE BRONCHITIS  . Pain in Joint, Upper Arm  . NECK PAIN, CHRONIC  . LATERAL EPICONDYLITIS  . DIARRHEA NOS  . RUQ PAIN  . ABDOMINAL PAIN, GENERALIZED  . ABDOMINAL PAIN, LOWER  . ABNORMAL TRANSAMINASE, (LFT'S)  . OTHER ABNORMAL BLOOD  CHEMISTRY  . CLOSED FRACTURE OF SURGICAL NECK OF HUMERUS  . LIVER FUNCTION TESTS, ABNORMAL, HX OF  . Ankle sprain  . Radius distal fracture  . Wrist pain, left  . Edema of hand  . Wrist fracture  . Muscle weakness (generalized)    End of Session Activity Tolerance: Patient tolerated treatment well General Behavior During Session: [facility] for tasks performed Cognition: [facility] for tasks performed OT Plan of Care OT Home Exercise Plan: Educated patien on 3 step ulnar nerve glide and wall stretch for median nerve.   Shirlean Mylar, OTR/L  09/01/2012, 12:12 PM

## 2012-09-06 ENCOUNTER — Ambulatory Visit (HOSPITAL_COMMUNITY): Payer: Medicaid Other | Admitting: Specialist

## 2012-09-09 ENCOUNTER — Ambulatory Visit (HOSPITAL_COMMUNITY)
Admission: RE | Admit: 2012-09-09 | Discharge: 2012-09-09 | Disposition: A | Discharge status: Home / Self Care | Payer: Medicaid Other | Source: Ambulatory Visit | Attending: Orthopedic Surgery | Admitting: Orthopedic Surgery

## 2012-09-09 DIAGNOSIS — R600 Localized edema: Secondary | ICD-10-CM

## 2012-09-09 DIAGNOSIS — M25532 Pain in left wrist: Secondary | ICD-10-CM

## 2012-09-09 DIAGNOSIS — M6281 Muscle weakness (generalized): Secondary | ICD-10-CM

## 2012-09-09 NOTE — Progress Notes (Signed)
Occupational Therapy Treatment Patient Details  Name: Crystal Stein MRN: [id number] Date of Birth: [birth year]  Today's Date: 09/09/2012 Time: 9562-1308 OT Time Calculation (min): 33 min Manual Therapy 934-945 11' Therapeutic Exercises [phone] 22' Visit#: 5 of 12  Re-eval: 09/16/12    Authorization: medicaid visits utilized has signed responsibility of payment form    Subjective S:  Ive been wearing the splint, and my fingers are still numb.   Pain Assessment Currently in Pain?: No/denies Pain Score: 0-No pain  Precautions/Restrictions   n/a  Exercise/Treatments Wrist Exercises Forearm Supination: Strengthening;Theraband;15 reps Theraband Level (Supination): Level 3 (Green) Bar Weights/Barbell (Forearm Supination): 2 lbs Forearm Pronation: Strengthening;Theraband;15 reps Theraband Level (Pronation): Level 3 (Green) Bar Weights/Barbell (Forearm Pronation): 2 lbs Wrist Flexion: Strengthening;Theraband;15 reps Theraband Level (Wrist Flexion): Level 3 (Green) Bar Weights/Barbell (Wrist Flexion): 2 lbs Wrist Extension: Strengthening;Theraband;15 reps Theraband Level (Wrist Extension): Level 3 (Green) Bar Weights/Barbell (Wrist Extension): 2 lbs Wrist Radial Deviation: Strengthening;Theraband;15 reps (2 pounds) Theraband Level (Radial Deviation): Level 3 (Green) Wrist Ulnar Deviation: Strengthening;15 reps (2 pounds)   Theraputty: Roll;Flatten;Grip;Locate Pegs Theraputty - Flatten: pink Theraputty - Roll: pink Theraputty - Grip: pink Theraputty - Locate Pegs: pink 10     Manual Therapy Manual Therapy: Myofascial release Myofascial Release: Myofascial release and manual stretching to flexor and extensor forearm, wrist, and hand.  Occupational Therapy Assessment and Plan OT Assessment and Plan Clinical Impression Statement: A:  Added theraband exercises for wrist strengthening today with mod resist green theraband.   OT Plan: P:  Increase to 3 pounds with wrist  strengthening as she is completing 2 pounds with relative ease.     Goals Short Term Goals Time to Complete Short Term Goals: 3 weeks Short Term Goal 1: Patient will be educated on a HEP. Short Term Goal 2: Patient will increase AROM by 10 degrees in her left forearm and wrist for greater ability to push herself out of a chair. Short Term Goal 2 Progress: Progressing toward goal Short Term Goal 3: Patient will increase left grip strength by 10 pounds and pinch strength by 2 pounds for increased ability to open pop bottles. Short Term Goal 3 Progress: Progressing toward goal Short Term Goal 4: Patient will decrease pain to 5/10 in her left forearm and wrist for greater functional use of her left hand. Short Term Goal 4 Progress: Progressing toward goal Short Term Goal 5: Patient will decrease fascial and scar restrictions to min-mod in for greater mobility needed when cooking and cleaning.  Short Term Goal 5 Progress: Progressing toward goal Short Term Goal 6: Patient will improve [facility] by decreasing completion time on Nine Hole Peg Test by 10" for increased independence fastening buttons. Short Term Goal 6 Progress: Progressing toward goal Long Term Goals Time to Complete Long Term Goals: 6 weeks Long Term Goal 1: Patient will return to prior level of independence with all daily and leisure activities.  Long Term Goal 1 Progress: Progressing toward goal Long Term Goal 2: Patient will have WNL AROM in her left wrist and forearm for greater independence with lifting groceries. Long Term Goal 2 Progress: Progressing toward goal Long Term Goal 3: Patient will increase left upper extremity strength to 5/5 for increased ability to maneuver a vacuum cleaner. Long Term Goal 3 Progress: Progressing toward goal Long Term Goal 4: Patient will increase left grip strength by 25# and pinch strength by 7 pounds for increased ability to open containers. Long Term Goal 4 Progress: Progressing toward goal Long  Term Goal 5: Patient will decrease fascial restrictions to minimal in her left upper extremity for greater mobiilty needed to complete daily activities.  Long Term Goal 5 Progress: Progressing toward goal Additional Long Term Goals?: Yes Long Term Goal 6: Patient will decrease pain to 2/10 in her left arm for greater I with daily activities.  Long Term Goal 6 Progress: Progressing toward goal Long Term Goal 7: Patient will improve [facility] by decreasing completion time on Nine Hole Peg Test to 25" for increased independence fastening jewelery. Long Term Goal 7 Progress: Progressing toward goal Long Term Goal 8: Patient will improve light touch sensation to diminished protective sensation from dulled sensation.  Long Term Goal 8 Progress: Progressing toward goal  Problem List Patient Active Problem List  Diagnosis  . ANEMIA  . BIPOLAR DISORDER UNSPECIFIED  . ANXIETY, CHRONIC  . NICOTINE ADDICTION  . CUBITAL TUNNEL SYNDROME  . ACUTE SINUSITIS, UNSPECIFIED  . ACUTE BRONCHITIS  . Pain in Joint, Upper Arm  . NECK PAIN, CHRONIC  . LATERAL EPICONDYLITIS  . DIARRHEA NOS  . RUQ PAIN  . ABDOMINAL PAIN, GENERALIZED  . ABDOMINAL PAIN, LOWER  . ABNORMAL TRANSAMINASE, (LFT'S)  . OTHER ABNORMAL BLOOD CHEMISTRY  . CLOSED FRACTURE OF SURGICAL NECK OF HUMERUS  . LIVER FUNCTION TESTS, ABNORMAL, HX OF  . Ankle sprain  . Radius distal fracture  . Wrist pain, left  . Edema of hand  . Wrist fracture  . Muscle weakness (generalized)    End of Session Activity Tolerance: Patient tolerated treatment well General Behavior During Session: [facility] for tasks performed Cognition: [facility] for tasks performed  GO    Shirlean Mylar, OTR/L  09/09/2012, 10:09 AM

## 2012-09-13 ENCOUNTER — Ambulatory Visit (HOSPITAL_COMMUNITY)
Admission: RE | Admit: 2012-09-13 | Payer: Medicaid Other | Source: Ambulatory Visit | Attending: Occupational Therapy | Admitting: Occupational Therapy

## 2012-09-13 ENCOUNTER — Ambulatory Visit (INDEPENDENT_AMBULATORY_CARE_PROVIDER_SITE_OTHER): Payer: Medicaid Other | Admitting: Orthopedic Surgery

## 2012-09-13 ENCOUNTER — Ambulatory Visit (INDEPENDENT_AMBULATORY_CARE_PROVIDER_SITE_OTHER): Payer: Medicaid Other

## 2012-09-13 ENCOUNTER — Encounter: Payer: Self-pay | Admitting: Orthopedic Surgery

## 2012-09-13 VITALS — BP 102/70 | Ht 65.5 in | Wt 136.0 lb

## 2012-09-13 DIAGNOSIS — S62102D Fracture of unspecified carpal bone, left wrist, subsequent encounter for fracture with routine healing: Secondary | ICD-10-CM

## 2012-09-13 DIAGNOSIS — S5290XD Unspecified fracture of unspecified forearm, subsequent encounter for closed fracture with routine healing: Secondary | ICD-10-CM

## 2012-09-13 NOTE — Progress Notes (Signed)
Patient ID: [name], female   DOB: 11/12/1970, 42 y.o.   MRN: [id number] Chief Complaint  Patient presents with  . Follow-up    follow up left wrist fracture DOS 06/17/13    BP 102/70  Ht 5' 5.5" (1.664 m)  Wt 136 lb (61.689 kg)  BMI 22.28 kg/m2  LMP 04/23/2011  Left wrist fracture open treatment internal fixation volar plate carpal tunnel approach  Carpal tunnel symptoms in the ring and long finger specifically half of the ring finger  Wound breakdown healed  Range of motion is near normal strength is near normal the only problem is the decreased sensation from the distal palmar crease to the fingertips of the long and half of the ring finger  Every thing else looks good. We did a carpal tunnel release initially so I think we should wait to see if this recovers if not we can repeat the carpal tunnel release  Return June for reevaluation of carpal tunnel

## 2012-09-16 ENCOUNTER — Ambulatory Visit (HOSPITAL_COMMUNITY)
Admission: RE | Admit: 2012-09-16 | Discharge: 2012-09-16 | Disposition: A | Discharge status: Home / Self Care | Payer: Medicaid Other | Source: Ambulatory Visit | Attending: Orthopedic Surgery | Admitting: Orthopedic Surgery

## 2012-09-16 DIAGNOSIS — M6281 Muscle weakness (generalized): Secondary | ICD-10-CM

## 2012-09-16 DIAGNOSIS — R600 Localized edema: Secondary | ICD-10-CM

## 2012-09-16 DIAGNOSIS — M25532 Pain in left wrist: Secondary | ICD-10-CM

## 2012-09-16 NOTE — Progress Notes (Signed)
Occupational Therapy Treatment Patient Details  Name: Crystal Stein MRN: [id number] Date of Birth: September 10, 1970  Today's Date: 09/16/2012 Time: 7829-5621 OT Time Calculation (min): 39 min Manual Therapy [phone] 16' Therapeutic Exercise [phone] 22'  Visit#: 6 of 12  Re-eval: 09/16/12    Authorization: medicaid visits utilized has signed responsibility of payment form   Authorization Time Period: n/a  Authorization Visit#: 4 of 4  Subjective Symptoms/Limitations Symptoms: S:  My fingers are still dead. Pain Assessment Currently in Pain?: No/denies  Precautions/Restrictions     Exercise/Treatments Wrist Exercises Forearm Supination: Strengthening;10 reps;Theraband;15 reps Theraband Level (Supination): Level 3 (Green) Bar Weights/Barbell (Forearm Supination): 3 lbs Forearm Pronation: Strengthening;10 reps;Theraband;15 reps Theraband Level (Pronation): Level 3 (Green) Bar Weights/Barbell (Forearm Pronation): 3 lbs Wrist Flexion: Strengthening;10 reps;Theraband;15 reps Theraband Level (Wrist Flexion): Level 3 (Green) Bar Weights/Barbell (Wrist Flexion): 3 lbs Wrist Extension: Strengthening;10 reps;Theraband;15 reps Theraband Level (Wrist Extension): Level 3 (Green) Bar Weights/Barbell (Wrist Extension): 3 lbs Wrist Radial Deviation: Strengthening;10 reps;Theraband;15 reps Theraband Level (Radial Deviation): Level 3 (Green) Wrist Ulnar Deviation: Strengthening;10 reps (wtih 3# wt.)   Sponges: 10,12,14 Theraputty: Roll;Flatten;Grip;Locate Pegs Theraputty - Flatten: pink Theraputty - Roll: pink Theraputty - Grip: pink Theraputty - Locate Pegs: pink 10 Hand Gripper with Large Beads: x10 with blocks  Hand Exercises Theraputty: Roll;Flatten;Grip;Locate Pegs Theraputty - Flatten: pink Theraputty - Roll: pink Theraputty - Grip: pink Theraputty - Locate Pegs: pink 10 Hand Gripper with Large Beads: x10 with blocks Sponges: 10,12,14     Manual Therapy Manual Therapy:  Myofascial release Myofascial Release: Myofascial release and manual stretching to flexor and extensor forearm, wrist and hand to decrease restrictions and pain to allow for increase in strength and ROM.  Occupational Therapy Assessment and Plan OT Assessment and Plan Clinical Impression Statement: A:  Increased to 3# weight and used blocks with gripper vs begs.   OT Plan: P:REASSESS   Goals Short Term Goals Time to Complete Short Term Goals: 3 weeks Short Term Goal 1: Patient will be educated on a HEP. Short Term Goal 2: Patient will increase AROM by 10 degrees in her left forearm and wrist for greater ability to push herself out of a chair. Short Term Goal 3: Patient will increase left grip strength by 10 pounds and pinch strength by 2 pounds for increased ability to open pop bottles. Short Term Goal 4: Patient will decrease pain to 5/10 in her left forearm and wrist for greater functional use of her left hand. Short Term Goal 5: Patient will decrease fascial and scar restrictions to min-mod in for greater mobility needed when cooking and cleaning.  Short Term Goal 6: Patient will improve [facility] by decreasing completion time on Nine Hole Peg Test by 10" for increased independence fastening buttons. Long Term Goals Time to Complete Long Term Goals: 6 weeks Long Term Goal 1: Patient will return to prior level of independence with all daily and leisure activities.  Long Term Goal 2: Patient will have WNL AROM in her left wrist and forearm for greater independence with lifting groceries. Long Term Goal 3: Patient will increase left upper extremity strength to 5/5 for increased ability to maneuver a vacuum cleaner. Long Term Goal 4: Patient will increase left grip strength by 25# and pinch strength by 7 pounds for increased ability to open containers. Long Term Goal 5: Patient will decrease fascial restrictions to minimal in her left upper extremity for greater mobiilty needed to complete daily  activities.  Additional Long Term Goals?: Yes Long  Term Goal 6: Patient will decrease pain to 2/10 in her left arm for greater I with daily activities.  Long Term Goal 7: Patient will improve [facility] by decreasing completion time on Nine Hole Peg Test to 25" for increased independence fastening jewelery. Long Term Goal 8: Patient will improve light touch sensation to diminished protective sensation from dulled sensation.   Problem List Patient Active Problem List  Diagnosis  . ANEMIA  . BIPOLAR DISORDER UNSPECIFIED  . ANXIETY, CHRONIC  . NICOTINE ADDICTION  . CUBITAL TUNNEL SYNDROME  . ACUTE SINUSITIS, UNSPECIFIED  . ACUTE BRONCHITIS  . Pain in Joint, Upper Arm  . NECK PAIN, CHRONIC  . LATERAL EPICONDYLITIS  . DIARRHEA NOS  . RUQ PAIN  . ABDOMINAL PAIN, GENERALIZED  . ABDOMINAL PAIN, LOWER  . ABNORMAL TRANSAMINASE, (LFT'S)  . OTHER ABNORMAL BLOOD CHEMISTRY  . CLOSED FRACTURE OF SURGICAL NECK OF HUMERUS  . LIVER FUNCTION TESTS, ABNORMAL, HX OF  . Ankle sprain  . Radius distal fracture  . Wrist pain, left  . Edema of hand  . Wrist fracture  . Muscle weakness (generalized)    End of Session Activity Tolerance: Patient tolerated treatment well General Behavior During Session: [facility] for tasks performed Cognition: [facility] for tasks performed  GO   Saige Canton L. Chaunice Obie, COTA/L 09/16/2012, 10:20 AM

## 2012-09-20 ENCOUNTER — Ambulatory Visit (HOSPITAL_COMMUNITY)
Admission: RE | Admit: 2012-09-20 | Discharge: 2012-09-20 | Disposition: A | Discharge status: Home / Self Care | Payer: Medicaid Other | Source: Ambulatory Visit | Attending: Orthopedic Surgery | Admitting: Orthopedic Surgery

## 2012-09-20 DIAGNOSIS — M6281 Muscle weakness (generalized): Secondary | ICD-10-CM | POA: Insufficient documentation

## 2012-09-20 DIAGNOSIS — R600 Localized edema: Secondary | ICD-10-CM

## 2012-09-20 DIAGNOSIS — M25539 Pain in unspecified wrist: Secondary | ICD-10-CM | POA: Insufficient documentation

## 2012-09-20 DIAGNOSIS — M25532 Pain in left wrist: Secondary | ICD-10-CM

## 2012-09-20 DIAGNOSIS — T8140XA Infection following a procedure, unspecified, initial encounter: Secondary | ICD-10-CM | POA: Insufficient documentation

## 2012-09-20 DIAGNOSIS — M25639 Stiffness of unspecified wrist, not elsewhere classified: Secondary | ICD-10-CM | POA: Insufficient documentation

## 2012-09-20 DIAGNOSIS — IMO0001 Reserved for inherently not codable concepts without codable children: Secondary | ICD-10-CM | POA: Insufficient documentation

## 2012-09-20 DIAGNOSIS — Y838 Other surgical procedures as the cause of abnormal reaction of the patient, or of later complication, without mention of misadventure at the time of the procedure: Secondary | ICD-10-CM | POA: Insufficient documentation

## 2012-09-20 NOTE — Progress Notes (Signed)
Occupational Therapy Treatment Patient Details  Name: Crystal Stein MRN: [id number] Date of Birth: Dec 08, 1970  Today's Date: 09/20/2012 Time: 4098-1191 OT Time Calculation (min): 28 min Manual Therapy [phone] 13' Reassessment [phone] 15' Visit#: 7 of 12  Re-eval: 09/20/12     Subjective S:  I feel great, Its just my fingers that are still numb.   Pain Assessment Currently in Pain?: No/denies  Precautions/Restrictions   n/a  Exercise/Treatments    Manual Therapy Manual Therapy: Myofascial release Myofascial Release: Myofascial release and manual stretching to flexor and extensor forearm, wrist and hand to decrease restrictions and pain to allow for increase in strength and ROM.  Occupational Therapy Assessment and Plan OT Assessment and Plan Clinical Impression Statement: A:  Reassessed for discharge this date  current functional status (initial evaluation)  supination 90 (84), pronation 80 (64), wrist flexion 50 (28), wrist extension 28 (30), grip strength 30 pounds (15 pounds), lateral pinch strength 8 pounds (6 pounds), 3 point pinch strength 8 pounds (4 pounds).  Nine Hole Peg Test 32.1 seconds (41 seconds).  Patient has met all short term goals and 4/8 long term goals.  She is pleased with her progress, except for her numbness.   OT Plan: P:  DC from skilled OT intervention this date.  Patient will continue to complete a HEP for grip strengthening and sensory reintegration.     Goals Short Term Goals Time to Complete Short Term Goals: 3 weeks Short Term Goal 1: Patient will be educated on a HEP. Short Term Goal 1 Progress: Met Short Term Goal 2: Patient will increase AROM by 10 degrees in her left forearm and wrist for greater ability to push herself out of a chair. Short Term Goal 2 Progress: Met Short Term Goal 3: Patient will increase left grip strength by 10 pounds and pinch strength by 2 pounds for increased ability to open pop bottles. Short Term Goal 3 Progress:  Met Short Term Goal 4: Patient will decrease pain to 5/10 in her left forearm and wrist for greater functional use of her left hand. Short Term Goal 4 Progress: Met Short Term Goal 5: Patient will decrease fascial and scar restrictions to min-mod in for greater mobility needed when cooking and cleaning.  Short Term Goal 5 Progress: Met Short Term Goal 6: Patient will improve [facility] by decreasing completion time on Nine Hole Peg Test by 10" for increased independence fastening buttons. Short Term Goal 6 Progress: Met Long Term Goals Time to Complete Long Term Goals: 6 weeks Long Term Goal 1: Patient will return to prior level of independence with all daily and leisure activities.  Long Term Goal 1 Progress: Met Long Term Goal 2: Patient will have WNL AROM in her left wrist and forearm for greater independence with lifting groceries. Long Term Goal 2 Progress: Met Long Term Goal 3: Patient will increase left upper extremity strength to 5/5 for increased ability to maneuver a vacuum cleaner. Long Term Goal 3 Progress: Progressing toward goal Long Term Goal 4: Patient will increase left grip strength by 25# and pinch strength by 7 pounds for increased ability to open containers. Long Term Goal 4 Progress: Progressing toward goal Long Term Goal 5: Patient will decrease fascial restrictions to minimal in her left upper extremity for greater mobiilty needed to complete daily activities.  Long Term Goal 5 Progress: Met Additional Long Term Goals?: Yes Long Term Goal 6: Patient will decrease pain to 2/10 in her left arm for greater I with  daily activities.  Long Term Goal 6 Progress: Met Long Term Goal 7: Patient will improve [facility] by decreasing completion time on Nine Hole Peg Test to 25" for increased independence fastening jewelery. Long Term Goal 7 Progress: Progressing toward goal Long Term Goal 8: Patient will improve light touch sensation to diminished protective sensation from dulled sensation.   Long Term Goal 8 Progress: Progressing toward goal  Problem List Patient Active Problem List  Diagnosis  . ANEMIA  . BIPOLAR DISORDER UNSPECIFIED  . ANXIETY, CHRONIC  . NICOTINE ADDICTION  . CUBITAL TUNNEL SYNDROME  . ACUTE SINUSITIS, UNSPECIFIED  . ACUTE BRONCHITIS  . Pain in Joint, Upper Arm  . NECK PAIN, CHRONIC  . LATERAL EPICONDYLITIS  . DIARRHEA NOS  . RUQ PAIN  . ABDOMINAL PAIN, GENERALIZED  . ABDOMINAL PAIN, LOWER  . ABNORMAL TRANSAMINASE, (LFT'S)  . OTHER ABNORMAL BLOOD CHEMISTRY  . CLOSED FRACTURE OF SURGICAL NECK OF HUMERUS  . LIVER FUNCTION TESTS, ABNORMAL, HX OF  . Ankle sprain  . Radius distal fracture  . Wrist pain, left  . Edema of hand  . Wrist fracture  . Muscle weakness (generalized)    End of Session Activity Tolerance: Patient tolerated treatment well General Behavior During Session: [facility] for tasks performed OT Plan of Care OT Home Exercise Plan: reviewed HEP for grip strengthening and sensory reintegration.  Shirlean Mylar, OTR/L  09/20/2012, 10:09 AM

## 2012-09-22 ENCOUNTER — Ambulatory Visit (HOSPITAL_COMMUNITY): Payer: Medicaid Other | Admitting: Occupational Therapy

## 2012-09-27 ENCOUNTER — Ambulatory Visit (HOSPITAL_COMMUNITY): Payer: Medicaid Other | Admitting: Occupational Therapy

## 2012-09-29 ENCOUNTER — Ambulatory Visit (HOSPITAL_COMMUNITY): Payer: Medicaid Other | Admitting: Specialist

## 2012-10-04 ENCOUNTER — Telehealth: Payer: Self-pay | Admitting: Orthopedic Surgery

## 2012-10-04 NOTE — Telephone Encounter (Signed)
Call from patient, stating left ankle swelling again; said had sprain of this ankle 10/18/11, at which time it was Trey Sailors at [facility].  Advised patient of Dr. Mort Sawyers schedule out of office for 1 week, and also, for her primary care physician referral, before we can schedule, due to her insurance requirement.  Patient will contact primary care.

## 2012-11-15 ENCOUNTER — Ambulatory Visit (INDEPENDENT_AMBULATORY_CARE_PROVIDER_SITE_OTHER): Payer: Medicaid Other | Admitting: Orthopedic Surgery

## 2012-11-15 ENCOUNTER — Encounter: Payer: Self-pay | Admitting: Orthopedic Surgery

## 2012-11-15 VITALS — BP 82/69 | Ht 65.0 in | Wt 136.0 lb

## 2012-11-15 DIAGNOSIS — S5290XD Unspecified fracture of unspecified forearm, subsequent encounter for closed fracture with routine healing: Secondary | ICD-10-CM

## 2012-11-15 DIAGNOSIS — M24873 Other specific joint derangements of unspecified ankle, not elsewhere classified: Secondary | ICD-10-CM

## 2012-11-15 DIAGNOSIS — G5600 Carpal tunnel syndrome, unspecified upper limb: Secondary | ICD-10-CM

## 2012-11-15 DIAGNOSIS — M25372 Other instability, left ankle: Secondary | ICD-10-CM | POA: Insufficient documentation

## 2012-11-15 DIAGNOSIS — S62102G Fracture of unspecified carpal bone, left wrist, subsequent encounter for fracture with delayed healing: Secondary | ICD-10-CM

## 2012-11-15 DIAGNOSIS — G5602 Carpal tunnel syndrome, left upper limb: Secondary | ICD-10-CM

## 2012-11-15 NOTE — Progress Notes (Signed)
Patient ID: Crystal Stein, female   DOB: [birth year], 42 y.o.   MRN: [id number] Chief Complaint  Patient presents with  . Follow-up    2 month recheck Left CTS DOS 06/17/12   Left wrist fracture open treatment internal fixation volar plate carpal tunnel approach, complication: Wound breakdown healed Post op had continued carpal tunnel symptoms Carpal tunnel symptoms in the ring and long finger specifically half of the ring finger Here for:  Return June for reevaluation of carpal tunnel  The patient reports improvement in her carpal tunnel symptoms and now she just has some decreased sensation at the tips of her fingers. She's regained almost all of her range of motion except for wrist extension which is slightly decreased versus the opposite area she is having no pain in her wrist  However she does have a new complaint and she is now complaining of left ankle pain over the lateral aspect of the prominence under the medial malleolus and pain in the lateral gutter with no instability but a feeling of uncomfortableness when walking on uneven ground  Review of systems is negative for neurologic symptoms in the foot and she does report musculoskeletal swelling in the foot and ankle when she is ambulating  Past Medical History  Diagnosis Date  . Anxiety   . Depression   . Asthma   . 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 COPD (chronic obstructive pulmonary disease)   . GERD (gastroesophageal reflux disease)   . Bipolar disorder   . Blood transfusion 2006     Vital signs are stable as recorded  General appearance is normal  The patient is alert and oriented x3  The patient's mood and affect are normal  Gait assessment: Ambulation is normal without assistive devices and there is no   The cardiovascular exam reveals normal pulses and temperature without edema or  swelling.  The lymphatic system is negative for palpable lymph nodes  The sensory exam is normal in  the left foot There are no pathologic reflexes.  Balance is normal.   Exam of the left ankle Inspection is tenderness over the anterior talofibular ligament and she has a prominence of the navicular medially no tenderness Range of motion her range of motion is normal Stability she is a large sulcus sign on anterior drawer I would grade as 1+ laxity with a firm endpoint Strength inversion strength seems to be normal Skin normal without rash  Encounter Diagnoses  Name Primary?  . Wrist fracture, left, with delayed healing, subsequent encounter   . Carpal tunnel syndrome of left wrist   . Left ankle instability Yes    Wrist fracture is healed well without any issues other than slight decrease in wrist extension Carpal tunnel symptoms are resolved except for fingertips with mild decreased sensation Left ankle instability new problem patient previously wore ASO in the past recommend physical therapy return in 1 month if no improvement recommend ligamentous repair

## 2012-11-15 NOTE — Patient Instructions (Signed)
CALL TO ARRANGE THERAPY FOR LEFT ANKLE

## 2012-11-17 ENCOUNTER — Ambulatory Visit (HOSPITAL_COMMUNITY): Payer: Medicaid Other | Attending: Physical Therapy | Admitting: Physical Therapy

## 2012-11-22 ENCOUNTER — Other Ambulatory Visit: Payer: Self-pay | Admitting: *Deleted

## 2012-11-22 ENCOUNTER — Telehealth: Payer: Self-pay | Admitting: Orthopedic Surgery

## 2012-11-22 DIAGNOSIS — M25372 Other instability, left ankle: Secondary | ICD-10-CM

## 2012-11-22 NOTE — Telephone Encounter (Signed)
i checked insurance   [employer] requires therapy first then mri then separate precert for surgery   So therapy 1st then if not better   Mri then if ligaments torn   Call we can call [employer] for precert, then  If they approve then we can do the surgery

## 2012-11-22 NOTE — Telephone Encounter (Signed)
Called patient with Dr. Mort Sawyers reply. Also faxed order for Physical Therapy to [facility].

## 2012-11-22 NOTE — Telephone Encounter (Signed)
Crystal Stein  11/15/2012 9:00 AM   Office Visit  MRN:  [id number]   Description: 42 year old female  Provider: Vickki Hearing, MD  Department: [facility]        Diagnoses    Left ankle instability    -  Primary    718.87    Wrist fracture, left, with delayed healing, subsequent encounter        V54.12    Carpal tunnel syndrome of left wrist        354.0      Reason for Visit    Follow-up    2 month recheck Left CTS DOS 06/17/12        Current Vitals - Last Recorded    BP Ht Wt BMI LMP      82/69 5\' 5"  (1.651 m) 136 lb (61.689 kg) 22.63 kg/m2 04/23/2011         Progress Notes    Vickki Hearing, MD at 11/15/2012  9:19 AM    Status: Signed                   Patient ID: Crystal Stein, female   DOB: 1971/05/03, 42 y.o.   MRN: [id number] Chief Complaint   Patient presents with     Follow-up       2 month recheck Left CTS DOS 06/17/12      Left wrist fracture open treatment internal fixation volar plate carpal tunnel approach, complication: Wound breakdown healed Post op had continued carpal tunnel symptoms Carpal tunnel symptoms in the ring and long finger specifically half of the ring finger Here for:   Return June for reevaluation of carpal tunnel   The patient reports improvement in her carpal tunnel symptoms and now she just has some decreased sensation at the tips of her fingers. She's regained almost all of her range of motion except for wrist extension which is slightly decreased versus the opposite area she is having no pain in her wrist   However she does have a new complaint and she is now complaining of left ankle pain over the lateral aspect of the prominence under the medial malleolus and pain in the lateral gutter with no instability but a feeling of uncomfortableness when walking on uneven ground   Review of systems is negative for neurologic symptoms in the foot and she does report musculoskeletal swelling in the foot and  ankle when she is ambulating    Past Medical History   Diagnosis  Date     Anxiety       Depression       Asthma       Edentulism, partial  10/07       full upper extraction      COPD (chronic obstructive pulmonary disease)       GERD (gastroesophageal reflux disease)       Bipolar disorder       Blood transfusion  2006         Vital signs are stable as recorded General appearance is normal The patient is alert and oriented x3 The patient's mood and affect are normal Gait assessment: Ambulation is normal without assistive devices and there is no   The cardiovascular exam reveals normal pulses and temperature without edema or  swelling. The lymphatic system is negative for palpable lymph nodes The sensory exam is normal in the left foot There are no pathologic reflexes. Balance is normal.  Exam of the left ankle Inspection is tenderness over the anterior talofibular ligament and she has a prominence of the navicular medially no tenderness Range of motion her range of motion is normal Stability she is a large sulcus sign on anterior drawer I would grade as 1+ laxity with a firm endpoint Strength inversion strength seems to be normal Skin normal without rash    Encounter Diagnoses   Name  Primary?     Wrist fracture, left, with delayed healing, subsequent encounter       Carpal tunnel syndrome of left wrist       Left ankle instability  Yes        Wrist fracture is healed well without any issues other than slight decrease in wrist extension Carpal tunnel symptoms are resolved except for fingertips with mild decreased sensation Left ankle instability new problem patient previously wore ASO in the past recommend physical therapy return in 1 month if no improvement recommend ligamentous repair             Not recorded         Orders Placed This Encounter    Ambulatory referral to Physical Therapy [ZOX09 Custom]         Patient Instructions     CALL TO ARRANGE THERAPY FOR LEFT ANKLE          Level of Service    PR OFFICE OUTPATIENT VISIT 25 MINUTES [99214]      Follow-up and Disposition    Return in about 4 weeks (around 12/13/2012) for LEFT ANKLE INSTABILITY.       Follow-up and Disposition History Recorded        All Flowsheet Templates (all recorded)    Encounter Vitals Flowsheet    Custom Formula Data Flowsheet    Anthropometrics Flowsheet           Referring Provider    Toma Deiters, MD       All Charges for This Encounter    Code Description Service Date Service Provider Modifiers Qty    [phone] PR OFFICE OUTPATIENT VISIT 25 MINUTES 11/15/2012 Vickki Hearing, MD   1        Other Encounter Related Information    Allergies & Medications         Problem List         History         Patient-Entered Questionnaires  Patient called to relay that her ankle problem is worse; states difficulty walking on it.  Office notes of last visit 11/15/12 indicate the ankle instability was a new problem -- physical therapy had been ordered, and patient had appointment 11/17/12 at [facility] - records indicate no show.  Patient states she has re-scheduled therapy appointment/initial evaluation for 11/28/12.  She is asking for an "emergency appointment" today.  Her insurance is [place] [employer] - she states her last referral from primary care indicated ankle problem.  Please advise if appointment is needed today or if other recommendations.  Please advise patient, ph# [phone].

## 2012-11-22 NOTE — Telephone Encounter (Signed)
Routing to Dr Harrison 

## 2012-11-24 ENCOUNTER — Emergency Department (HOSPITAL_COMMUNITY)
Admission: EM | Admit: 2012-11-24 | Discharge: 2012-11-24 | Disposition: A | Discharge status: Home / Self Care | Payer: Medicaid Other | Attending: Emergency Medicine | Admitting: Emergency Medicine

## 2012-11-24 ENCOUNTER — Emergency Department (HOSPITAL_COMMUNITY): Payer: Medicaid Other

## 2012-11-24 ENCOUNTER — Encounter (HOSPITAL_COMMUNITY): Payer: Self-pay | Admitting: Emergency Medicine

## 2012-11-24 DIAGNOSIS — G8929 Other chronic pain: Secondary | ICD-10-CM

## 2012-11-24 DIAGNOSIS — K219 Gastro-esophageal reflux disease without esophagitis: Secondary | ICD-10-CM | POA: Insufficient documentation

## 2012-11-24 DIAGNOSIS — Z8719 Personal history of other diseases of the digestive system: Secondary | ICD-10-CM | POA: Insufficient documentation

## 2012-11-24 DIAGNOSIS — Z79899 Other long term (current) drug therapy: Secondary | ICD-10-CM | POA: Insufficient documentation

## 2012-11-24 DIAGNOSIS — M25579 Pain in unspecified ankle and joints of unspecified foot: Secondary | ICD-10-CM | POA: Insufficient documentation

## 2012-11-24 DIAGNOSIS — M722 Plantar fascial fibromatosis: Secondary | ICD-10-CM | POA: Insufficient documentation

## 2012-11-24 DIAGNOSIS — F319 Bipolar disorder, unspecified: Secondary | ICD-10-CM | POA: Insufficient documentation

## 2012-11-24 DIAGNOSIS — J449 Chronic obstructive pulmonary disease, unspecified: Secondary | ICD-10-CM | POA: Insufficient documentation

## 2012-11-24 DIAGNOSIS — J4489 Other specified chronic obstructive pulmonary disease: Secondary | ICD-10-CM | POA: Insufficient documentation

## 2012-11-24 DIAGNOSIS — F17200 Nicotine dependence, unspecified, uncomplicated: Secondary | ICD-10-CM | POA: Insufficient documentation

## 2012-11-24 DIAGNOSIS — F411 Generalized anxiety disorder: Secondary | ICD-10-CM | POA: Insufficient documentation

## 2012-11-24 NOTE — ED Notes (Signed)
Pt states ankle pain since Saturday morning. Pt states pain to bottom of foot. Pt states feels better when walking a lot.

## 2012-11-25 ENCOUNTER — Other Ambulatory Visit: Payer: Self-pay | Admitting: Adult Health

## 2012-11-26 NOTE — ED Provider Notes (Signed)
History     CSN: [id number]  Arrival date & time 11/24/12  0941   First MD Initiated Contact with Patient 11/24/12 1021      Chief Complaint  Patient presents with  . Ankle Pain    (Consider location/radiation/quality/duration/timing/severity/associated sxs/prior treatment) HPI Comments: Crystal Stein is a 42 y.o. Female with a history of chronic left foot and ankle pain for which she  has been scheduled by Dr. Romeo Apple to start physical therapy in 4 days.  However, over the past several days she has developed a new pain across the bottom of her left foot which originates at her heel and radiates into her toes.  She denies any new injury, stating she woke with this pain last weekend and denied any injury or excessive activity the prior day.  Pain actually improves after she has been walking for a while and is worse when she first stands after being sedentary.  She's been trying to walk on her tiptoes to avoid putting weight on her heel.  She does take hydrocodone daily for her chronic pain.  She has found no other alleviators for this symptom.    The history is provided by the patient.    Past Medical History  Diagnosis Date  . Anxiety   . Depression   . Asthma   . 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 COPD (chronic obstructive pulmonary disease)   . GERD (gastroesophageal reflux disease)   . Bipolar disorder   . Blood transfusion 2006    Past Surgical History  Procedure Laterality Date  . Tennis elbow release  04/27/06    left; Romeo Apple, [facility]  . Multiple tooth extractions  10/07    full upper  . Orif proximal humerus fracture  08/06    left,  . Dilation and curettage of uterus  June 2012    failed ablation  . Tubal ligation  June 2012    [facility]  . Bipolar disorder    . Hysteroscopy  01/27/2011    Procedure: HYSTEROSCOPY;  Surgeon: Tilda Burrow, MD;  Location: [facility];  Service: Gynecology;  Laterality: N/A;  . Vaginal hysterectomy  05/19/2011   Procedure: HYSTERECTOMY VAGINAL;  Surgeon: Tilda Burrow, MD;  Location: [facility];  Service: Gynecology;  Laterality: N/A;  . Orif wrist fracture  06/17/2012    Procedure: OPEN REDUCTION INTERNAL FIXATION (ORIF) WRIST FRACTURE;  Surgeon: Vickki Hearing, MD;  Location: [facility];  Service: Orthopedics;  Laterality: Left;  . Carpal tunnel release  06/17/2012    Procedure: CARPAL TUNNEL RELEASE;  Surgeon: Vickki Hearing, MD;  Location: [facility];  Service: Orthopedics;  Laterality: Left;    Family History  Problem Relation Age of Onset  . Anesthesia problems Neg Hx   . Hypotension Neg Hx   . Malignant hyperthermia Neg Hx   . Pseudochol deficiency Neg Hx   . Diabetes      History  Substance Use Topics  . Smoking status: Current Every Day Smoker -- 1.00 packs/day for 22 years    Types: Cigarettes  . Smokeless tobacco: Not on file  . Alcohol Use: No    OB History   Grav Para Term Preterm Abortions TAB SAB Ect Mult Living                  Review of Systems  Constitutional: Negative for fever.  Musculoskeletal: Positive for arthralgias. Negative for myalgias and joint swelling.  Neurological: Negative for weakness  and numbness.    Allergies  Review of patient's allergies indicates no known allergies.  Home Medications   Current Outpatient Rx  Name  Route  Sig  Dispense  Refill  . ALPRAZolam (XANAX) 1 MG tablet   Oral   Take 1 mg by mouth 4 (four) times daily.         Marland Kitchen gabapentin (NEURONTIN) 300 MG capsule   Oral   Take 300 mg by mouth 3 (three) times daily.         . hydrochlorothiazide (HYDRODIURIL) 25 MG tablet   Oral   Take 25 mg by mouth daily.         Marland Kitchen HYDROcodone-acetaminophen (NORCO) 10-325 MG per tablet   Oral   Take 1 tablet by mouth every 4 (four) hours as needed. Pain         . omeprazole (PRILOSEC) 20 MG capsule   Oral   Take 20 mg by mouth daily.           Marland Kitchen PARoxetine (PAXIL-CR) 37.5 MG 24 hr tablet   Oral   Take 75 mg by mouth 2  (two) times daily.          . ziprasidone (GEODON) 40 MG capsule   Oral   Take 40 mg by mouth daily.          . valACYclovir (VALTREX) 1000 MG tablet      TAKE TWO TABLETS NOW AND TWO TABLET TOMORROW IN THE MORNING IF NEEDED   30 tablet   6     BP 104/64  Pulse 104  Temp(Src) 98.3 F (36.8 C) (Oral)  Resp 16  Ht 5\' 4"  (1.626 m)  Wt 136 lb (61.689 kg)  BMI 23.33 kg/m2  SpO2 100%  LMP 04/23/2011  Physical Exam  Constitutional: She appears well-developed and well-nourished.  HENT:  Head: Atraumatic.  Neck: Normal range of motion.  Cardiovascular:  Pulses equal bilaterally  Musculoskeletal: She exhibits tenderness. She exhibits no edema.  Tender to palpation at left plantar fascia insertion.  There is no edema, erythema or visible trauma/ puncture wounds/swelling.  Pedal pulses are normal.  Neurological: She is alert. She has normal strength. She displays normal reflexes. No sensory deficit.  Equal strength  Skin: Skin is warm and dry.  Psychiatric: She has a normal mood and affect.    ED Course  Procedures (including critical care time)  Labs Reviewed - No data to display No results found.   1. Plantar fasciitis of left foot   2. Ankle pain, chronic, left       MDM  Patients labs and/or radiological studies were viewed and considered during the medical decision making and disposition process.  Patient was encouraged to keep her appointment with physical therapy in 4 days.  She has crutches at home and she was encouraged to use her crutches to minimize weightbearing.  She was placed in ankle ASO for additional support of the ankle joint.  Ice and elevation was also recommended, patient advised to contact Dr. Romeo Apple for further management of this problem.  The patient appears reasonably screened and/or stabilized for discharge and I doubt any other medical condition or other [facility] requiring further screening, evaluation, or treatment in the ED at this time  prior to discharge.         Burgess Amor, PA-C 11/26/12 1541

## 2012-11-28 ENCOUNTER — Ambulatory Visit (HOSPITAL_COMMUNITY)
Admission: RE | Admit: 2012-11-28 | Discharge: 2012-11-28 | Disposition: A | Discharge status: Home / Self Care | Payer: Medicaid Other | Source: Ambulatory Visit | Attending: Physical Therapy | Admitting: Physical Therapy

## 2012-11-28 DIAGNOSIS — IMO0001 Reserved for inherently not codable concepts without codable children: Secondary | ICD-10-CM | POA: Insufficient documentation

## 2012-11-28 DIAGNOSIS — M25539 Pain in unspecified wrist: Secondary | ICD-10-CM | POA: Insufficient documentation

## 2012-11-28 DIAGNOSIS — X58XXXA Exposure to other specified factors, initial encounter: Secondary | ICD-10-CM | POA: Insufficient documentation

## 2012-11-28 DIAGNOSIS — S93402A Sprain of unspecified ligament of left ankle, initial encounter: Secondary | ICD-10-CM | POA: Insufficient documentation

## 2012-11-28 DIAGNOSIS — M79609 Pain in unspecified limb: Secondary | ICD-10-CM | POA: Insufficient documentation

## 2012-11-28 DIAGNOSIS — M25529 Pain in unspecified elbow: Secondary | ICD-10-CM | POA: Insufficient documentation

## 2012-11-28 DIAGNOSIS — M6281 Muscle weakness (generalized): Secondary | ICD-10-CM | POA: Insufficient documentation

## 2012-11-28 DIAGNOSIS — R609 Edema, unspecified: Secondary | ICD-10-CM | POA: Insufficient documentation

## 2012-11-28 DIAGNOSIS — R262 Difficulty in walking, not elsewhere classified: Secondary | ICD-10-CM | POA: Insufficient documentation

## 2012-11-28 DIAGNOSIS — M24873 Other specific joint derangements of unspecified ankle, not elsewhere classified: Secondary | ICD-10-CM | POA: Insufficient documentation

## 2012-11-28 DIAGNOSIS — G5600 Carpal tunnel syndrome, unspecified upper limb: Secondary | ICD-10-CM | POA: Insufficient documentation

## 2012-11-28 DIAGNOSIS — S93409A Sprain of unspecified ligament of unspecified ankle, initial encounter: Secondary | ICD-10-CM | POA: Insufficient documentation

## 2012-11-28 NOTE — ED Provider Notes (Signed)
Medical screening examination/treatment/procedure(s) were performed by non-physician practitioner and as supervising physician I was immediately available for consultation/collaboration. Devoria Albe, MD, Armando Gang   Ward Givens, MD 11/28/12 [phone]

## 2012-11-28 NOTE — Evaluation (Signed)
Physical Therapy Evaluation  Patient Details  Name: Crystal Stein MRN: [id number] Date of Birth: Jun 06, 1971  Today's Date: 11/28/2012 Time: 1025-1100 PT Time Calculation (min): 35 min Charges: 1 eval              Visit#: 1 of 4  Re-eval: 12/28/12 Assessment Diagnosis: Ankle instability Next MD Visit: Dr. Romeo Apple  Authorization: Medicaid, used by OT, applied for [place] discount, unsure if she qualifies.     Authorization Time Period:    Authorization Visit#:   of     Past Medical History:  Past Medical History  Diagnosis Date  . Anxiety   . Depression   . Asthma   . 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 COPD (chronic obstructive pulmonary disease)   . GERD (gastroesophageal reflux disease)   . Bipolar disorder   . Blood transfusion 2006   Past Surgical History:  Past Surgical History  Procedure Laterality Date  . Tennis elbow release  04/27/06    left; Romeo Apple, [facility]  . Multiple tooth extractions  10/07    full upper  . Orif proximal humerus fracture  08/06    left,  . Dilation and curettage of uterus  June 2012    failed ablation  . Tubal ligation  June 2012    [facility]  . Bipolar disorder    . Hysteroscopy  01/27/2011    Procedure: HYSTEROSCOPY;  Surgeon: Tilda Burrow, MD;  Location: [facility];  Service: Gynecology;  Laterality: N/A;  . Vaginal hysterectomy  05/19/2011    Procedure: HYSTERECTOMY VAGINAL;  Surgeon: Tilda Burrow, MD;  Location: [facility];  Service: Gynecology;  Laterality: N/A;  . Orif wrist fracture  06/17/2012    Procedure: OPEN REDUCTION INTERNAL FIXATION (ORIF) WRIST FRACTURE;  Surgeon: Vickki Hearing, MD;  Location: [facility];  Service: Orthopedics;  Laterality: Left;  . Carpal tunnel release  06/17/2012    Procedure: CARPAL TUNNEL RELEASE;  Surgeon: Vickki Hearing, MD;  Location: [facility];  Service: Orthopedics;  Laterality: Left;    Subjective Symptoms/Limitations Pertinent History: Pt reports that she went to  the hospital on Thursday and had x-rays taken of her foot without significant findings.  She is unable to recieve MRI to determine ligament damage until she recieves therapy.  She has attended therapy for her shoulder and was d/c in April.  She reports that 1 year ago she sprained her ankle and has had increased swelling and pain after the sprain.  Her c/co is pain in her plantar fascial region, pain in her ankle, difficulty walking  Patient Stated Goals: get rid of the pain  Pain Assessment Currently in Pain?: Yes Pain Score:   9 Pain Location: Ankle Pain Orientation: Left Pain Type: Chronic pain Pain Onset: More than a month ago Pain Frequency: Constant Pain Relieving Factors: pain medication, not walking Effect of Pain on Daily Activities: difficulty walking  Precautions/Restrictions  Precautions Required Braces or Orthoses: Other Brace/Splint (Ankle ALSO)  Balance Screening Balance Screen Has the patient fallen in the past 6 months: No Has the patient had a decrease in activity level because of a fear of falling? : Yes Is the patient reluctant to leave their home because of a fear of falling? : No  Assessment LLE Strength Left Hip Flexion: 4/5 Left Hip Extension: 3+/5 Left Hip ABduction: 3+/5 Left Hip ADduction: 3+/5 Left Ankle Dorsiflexion: 4/5 Left Ankle Plantar Flexion: 3-/5 Left Ankle Inversion: 3+/5 Left Ankle Eversion: 3+/5  Palpation Palpation: pain and tenderness over Lt CFL and superifical peroneal retinaculum, moderate pain to insertion of plantar fascia  Mobility/Balance  Ambulation/Gait Ambulation/Gait: Yes Gait Pattern: Antalgic;Left foot flat   Exercise/Treatments Supine Straight Leg Raises: Left;10 reps Sidelying Hip ABduction: Left;10 reps Prone  Hip Extension: Left;10 reps Ankle Exercises - Standing Heel Raises: 10 reps Toe Raise: 10 reps Ankle Exercises - Supine T-Band: all directions red t-band x10 reps  Physical Therapy Assessment and  Plan PT Assessment and Plan Clinical Impression Statement: Pt is referred to PT for ankle instability with impairments listed below. She is a previous patient of this facility for multiple injuries and has utlizied all of her Medicaid therapy allowances. At this time she is self-pay for therapy and therefore continue 1x/week for 4 weeks for HEP Pt will benefit from skilled therapeutic intervention in order to improve on the following deficits: Abnormal gait;Pain;Difficulty walking;Decreased strength;Decreased range of motion;Increased fascial restricitons Rehab Potential: Fair Clinical Impairments Affecting Rehab Potential: secondary to self-pay and restrictions on Medicaid  PT Frequency: Min 1X/week PT Duration: 4 weeks PT Treatment/Interventions: Functional mobility training;Therapeutic activities;Therapeutic exercise;Balance training;Neuromuscular re-education;Patient/family education;Manual techniques;Stair training;Gait training PT Plan: manual techniques to lateral ankle and plantar fascia, add towel crunch, marble pick up, tandem stance  Goals Home Exercise Program Pt will Perform Home Exercise Program: Independently PT Goal: Perform Home Exercise Program - Progress: Goal set today PT Short Term Goals Time to Complete Short Term Goals: 4 weeks PT Short Term Goal 1: Pt will improve her ankle strength to [facility] in order to ascend and descend stairs with greater ease.  PT Short Term Goal 2: Pt will improve her RLE strength in order to ambulate with appropriate gait mechanics.  PT Short Term Goal 3: Pt will decrease fascial restrictions to lateral ankle in order to report pain less than 5/10 for 50% of her day.   Problem List Patient Active Problem List   Diagnosis Date Noted  . Left ankle sprain 11/28/2012  . Carpal tunnel syndrome of left wrist 11/15/2012  . Left ankle instability 11/15/2012  . Muscle weakness (generalized) 08/19/2012  . Wrist fracture 08/15/2012  . Wrist pain, left  07/01/2012  . Edema of hand 07/01/2012  . Radius distal fracture 06/16/2012  . Ankle sprain 11/04/2011  . OTHER ABNORMAL BLOOD CHEMISTRY 01/22/2009  . LIVER FUNCTION TESTS, ABNORMAL, HX OF 01/16/2009  . ANEMIA 09/13/2008  . DIARRHEA NOS 09/13/2008  . ABDOMINAL PAIN, GENERALIZED 09/13/2008  . ABDOMINAL PAIN, LOWER 09/13/2008  . ABNORMAL TRANSAMINASE, (LFT'S) 09/13/2008  . RUQ PAIN 08/17/2008  . ACUTE SINUSITIS, UNSPECIFIED 07/23/2008  . ACUTE BRONCHITIS 07/23/2008  . LATERAL EPICONDYLITIS 04/25/2008  . CUBITAL TUNNEL SYNDROME 02/07/2008  . Pain in Joint, Upper Arm 02/07/2008  . CLOSED FRACTURE OF SURGICAL NECK OF HUMERUS 02/07/2008  . BIPOLAR DISORDER UNSPECIFIED 12/02/2007  . ANXIETY, CHRONIC 12/02/2007  . NICOTINE ADDICTION 12/02/2007  . NECK PAIN, CHRONIC 12/02/2007    PT Plan of Care PT Home Exercise Plan: see scanned report PT Patient Instructions:  importance of HEP, discussed insurance limitations, answered questions about POC Consulted and Agree with Plan of Care: Patient  GP    Annett Fabian, MPT ATC 11/28/2012, 2:13 PM  Physician Documentation Your signature is required to indicate approval of the treatment plan as stated above.  Please sign and either send electronically or make a copy of this report for your files and return this physician signed original.   Please mark one 1.__approve of plan  2. ___approve of plan  with the following conditions.   ______________________________                                                          _____________________ Physician Signature                                                                                                             Date  [facility] ATTNClaris Gower [address] [place], [place] [address] Phone: [phone] Fax: [phone]     INITIAL EVALUATION  Physical Therapy     Patient Name: Crystal Stein Date Of Birth: May 03, 1971  Guardian Name: N/A Treatment  ICD-9 Code: 65784  Address: [address] Date of Evaluation: 11/28/2012  [place], [place] [address] Requested Dates of Service: 11/28/2012 - 12/19/2012       Therapy History: No known therapy for this problem  Reason For Referral: Recipient has a new injury, disease or condition  Prior Level of Function: Independent/Modified Independent with all ADLs (OT/PT) or Audition, Communication, Voice and/or Swallowing Skills (ST/AUD)  Additional Medical History: Pertinent History: Pt reports that she went to the hospital on Thursday and had x-rays taken of her foot without significant findings. She is unable to recieve MRI to determine ligament damage until she recieves therapy. She has attended therapy for her shoulder and was d/c in April. She reports that 1 year ago she sprained her ankle and has had increased swelling and pain after the sprain. Her c/co is pain in her plantar fascial region, pain in her ankle, difficulty walking Patient Stated Goals: get rid of the pain Pain Assessment Currently in Pain?: Yes Pain Score: 9 Pain Location: Ankle Pain Orientation: Left Pain Type: Chronic pain Pain Onset: More than a month ago Pain Frequency: Constant Pain Relieving Factors: pain medication, not walking Effect of Pain on Daily Activities: difficulty walking Precautions/Restrictions Precautions Required Braces or Orthoses: Other Brace/Splint (Ankle ALSO) Balance Screening Balance Screen Has the patient fallen in the past 6 months: No Has the patient had a decrease in activity level because of a fear of falling? : Yes Is the patient reluctant to leave their home because of a fear of falling? : No Assessment LLE Strength Left Hip Flexion: 4/5 Left Hip Extension: 3+/5 Left Hip ABduction: 3+/5 Left Hip ADduction: 3+/5 Left Ankle Dorsiflexion: 4/5 Left Ankle Plantar Flexion: 3-/5 Left Ankle Inversion: 3+/5 Left Ankle Eversion: 3+/5 Palpation Palpation: pain and tenderness over Lt CFL and superifical peroneal retinaculum,  moderate pain to insertion of plantar fascia Mobility/Balance Ambulation/Gait Ambulation/Gait: Yes Gait Pattern: Antalgic;Left foot flat   Prematurity: N/A  Severity Level: N/A       Treatment Goals:  1. Goal: Independent with HEP  Baseline: Given  Duration: 3 Week(s)  2. Goal: Pt will improve her ankle strength to [facility] in order to ascend and  descend stairs with greater ease  Baseline: LE Strength Left Hip Flexion: 4/5 Left Hip Extension: 3+/5 Left Hip ABduction: 3+/5 Left Hip ADduction: 3+/5 Left Ankle Dorsiflexion: 4/5 Left Ankle Plantar Flexion: 3-/5 Left Ankle Inversion: 3+/5 Left Ankle Eversion: 3+/5  Duration: 3 Week(s)  Goal: Pt will improve her RLE strength in order to ambulate with appropriate gait mechanics  Baseline: Antalgic gait  Duration: 3 Week(s)         Treatment Frequency/Duration:  1x/week for 3 weeks  Units per visit: N/A    Additional Information: Clinical Impression Statement: Pt is referred to PT for ankle instability with impairments listed below. She is a previous patient of this facility for multiple injuries and has utlizied all of her Medicaid therapy allowances. At this time she is self-pay for therapy and therefore continue 1x/week for 4 weeks for HEP Pt will benefit from skilled therapeutic intervention in order to improve on the following deficits: Abnormal gait;Pain;Difficulty walking;Decreased strength;Decreased range of motion;Increased fascial restricitons Rehab Potential: Fair Clinical Impairments Affecting Rehab Potential: secondary to self-pay and restrictions on Medicaid PT Frequency: Min 1X/week PT Duration: 4 weeks PT Treatment/Interventions: Functional mobility training;Therapeutic activities;Therapeutic exercise;Balance training;Neuromuscular re-education;Patient/family education;Manual techniques;Stair training;Gait training   Annett Fabian, MPT ATC 11/28/12      Therapist Signature  Date Physician Signature  Date    Annett Fabian       Therapist Name   Physician Name   Refer to the Review Status page for current case status

## 2012-12-06 ENCOUNTER — Inpatient Hospital Stay (HOSPITAL_COMMUNITY): Admission: RE | Admit: 2012-12-06 | Payer: Medicaid Other | Source: Ambulatory Visit | Admitting: Physical Therapy

## 2012-12-13 ENCOUNTER — Inpatient Hospital Stay (HOSPITAL_COMMUNITY): Admission: RE | Admit: 2012-12-13 | Payer: Medicaid Other | Source: Ambulatory Visit | Admitting: Physical Therapy

## 2012-12-13 ENCOUNTER — Ambulatory Visit: Payer: Medicaid Other | Admitting: Orthopedic Surgery

## 2012-12-13 ENCOUNTER — Encounter: Payer: Self-pay | Admitting: Orthopedic Surgery

## 2013-12-20 DIAGNOSIS — Z9889 Other specified postprocedural states: Secondary | ICD-10-CM

## 2013-12-20 HISTORY — DX: Other specified postprocedural states: Z98.890

## 2014-01-10 ENCOUNTER — Other Ambulatory Visit: Payer: Self-pay

## 2014-01-10 ENCOUNTER — Encounter (HOSPITAL_COMMUNITY): Payer: Self-pay | Admitting: Emergency Medicine

## 2014-01-10 ENCOUNTER — Inpatient Hospital Stay (HOSPITAL_COMMUNITY)
Admission: EM | Admit: 2014-01-10 | Discharge: 2014-01-12 | DRG: 282 | Disposition: A | Discharge status: Home / Self Care | Payer: Medicaid Other | Attending: Cardiology | Admitting: Cardiology

## 2014-01-10 ENCOUNTER — Emergency Department (HOSPITAL_COMMUNITY): Payer: Medicaid Other

## 2014-01-10 DIAGNOSIS — I959 Hypotension, unspecified: Secondary | ICD-10-CM | POA: Diagnosis present

## 2014-01-10 DIAGNOSIS — F17200 Nicotine dependence, unspecified, uncomplicated: Secondary | ICD-10-CM | POA: Diagnosis present

## 2014-01-10 DIAGNOSIS — R7989 Other specified abnormal findings of blood chemistry: Secondary | ICD-10-CM

## 2014-01-10 DIAGNOSIS — F1210 Cannabis abuse, uncomplicated: Secondary | ICD-10-CM | POA: Diagnosis present

## 2014-01-10 DIAGNOSIS — Z833 Family history of diabetes mellitus: Secondary | ICD-10-CM | POA: Diagnosis not present

## 2014-01-10 DIAGNOSIS — Z9851 Tubal ligation status: Secondary | ICD-10-CM

## 2014-01-10 DIAGNOSIS — E876 Hypokalemia: Secondary | ICD-10-CM | POA: Diagnosis present

## 2014-01-10 DIAGNOSIS — E785 Hyperlipidemia, unspecified: Secondary | ICD-10-CM | POA: Diagnosis present

## 2014-01-10 DIAGNOSIS — F419 Anxiety disorder, unspecified: Secondary | ICD-10-CM

## 2014-01-10 DIAGNOSIS — J4489 Other specified chronic obstructive pulmonary disease: Secondary | ICD-10-CM | POA: Diagnosis present

## 2014-01-10 DIAGNOSIS — I214 Non-ST elevation (NSTEMI) myocardial infarction: Secondary | ICD-10-CM | POA: Diagnosis present

## 2014-01-10 DIAGNOSIS — I498 Other specified cardiac arrhythmias: Secondary | ICD-10-CM | POA: Diagnosis present

## 2014-01-10 DIAGNOSIS — F411 Generalized anxiety disorder: Secondary | ICD-10-CM | POA: Diagnosis present

## 2014-01-10 DIAGNOSIS — J449 Chronic obstructive pulmonary disease, unspecified: Secondary | ICD-10-CM

## 2014-01-10 DIAGNOSIS — R413 Other amnesia: Secondary | ICD-10-CM | POA: Diagnosis present

## 2014-01-10 DIAGNOSIS — R079 Chest pain, unspecified: Secondary | ICD-10-CM

## 2014-01-10 DIAGNOSIS — F319 Bipolar disorder, unspecified: Secondary | ICD-10-CM | POA: Diagnosis present

## 2014-01-10 DIAGNOSIS — K219 Gastro-esophageal reflux disease without esophagitis: Secondary | ICD-10-CM | POA: Diagnosis present

## 2014-01-10 DIAGNOSIS — Z720 Tobacco use: Secondary | ICD-10-CM | POA: Diagnosis present

## 2014-01-10 DIAGNOSIS — R9431 Abnormal electrocardiogram [ECG] [EKG]: Secondary | ICD-10-CM

## 2014-01-10 DIAGNOSIS — R0789 Other chest pain: Secondary | ICD-10-CM | POA: Diagnosis present

## 2014-01-10 LAB — CBC WITH DIFFERENTIAL/PLATELET
Basophils Absolute: 0 10*3/uL (ref 0.0–0.1)
Basophils Relative: 0 % (ref 0–1)
Eosinophils Absolute: 0 10*3/uL (ref 0.0–0.7)
Eosinophils Relative: 0 % (ref 0–5)
HCT: 38.7 % (ref 36.0–46.0)
Hemoglobin: 13.8 g/dL (ref 12.0–15.0)
Lymphocytes Relative: 42 % (ref 12–46)
Lymphs Abs: 2.9 10*3/uL (ref 0.7–4.0)
MCH: 32.5 pg (ref 26.0–34.0)
MCHC: 35.7 g/dL (ref 30.0–36.0)
MCV: 91.3 fL (ref 78.0–100.0)
Monocytes Absolute: 0.6 10*3/uL (ref 0.1–1.0)
Monocytes Relative: 9 % (ref 3–12)
Neutro Abs: 3.3 10*3/uL (ref 1.7–7.7)
Neutrophils Relative %: 49 % (ref 43–77)
Platelets: 271 10*3/uL (ref 150–400)
RBC: 4.24 MIL/uL (ref 3.87–5.11)
RDW: 14 % (ref 11.5–15.5)
WBC: 6.9 10*3/uL (ref 4.0–10.5)

## 2014-01-10 LAB — RAPID URINE DRUG SCREEN, HOSP PERFORMED
Amphetamines: NOT DETECTED
Barbiturates: NOT DETECTED
Benzodiazepines: POSITIVE — AB
Cocaine: NOT DETECTED
Opiates: NOT DETECTED
Tetrahydrocannabinol: POSITIVE — AB

## 2014-01-10 LAB — MAGNESIUM: Magnesium: 1.9 mg/dL (ref 1.5–2.5)

## 2014-01-10 LAB — BASIC METABOLIC PANEL
Anion gap: 14 (ref 5–15)
BUN: 8 mg/dL (ref 6–23)
CO2: 27 mEq/L (ref 19–32)
Calcium: 8.8 mg/dL (ref 8.4–10.5)
Chloride: 95 mEq/L — ABNORMAL LOW (ref 96–112)
Creatinine, Ser: 0.55 mg/dL (ref 0.50–1.10)
GFR calc Af Amer: >90 mL/min (ref >90)
GFR calc non Af Amer: >90 mL/min (ref >90)
Glucose, Bld: 115 mg/dL — ABNORMAL HIGH (ref 70–99)
Potassium: 2.9 mEq/L — CL (ref 3.7–5.3)
Sodium: 136 mEq/L — ABNORMAL LOW (ref 137–147)

## 2014-01-10 LAB — TROPONIN I: Troponin I: 1.58 ng/mL (ref <0.30)

## 2014-01-10 LAB — MRSA PCR SCREENING: MRSA BY PCR: NEGATIVE

## 2014-01-10 MED ORDER — METOPROLOL TARTRATE 25 MG PO TABS
25.0000 mg | ORAL_TABLET | Freq: Two times a day (BID) | ORAL | Status: DC
Start: 2014-01-10 — End: 2014-01-12
  Administered 2014-01-10 – 2014-01-12 (×4): 25 mg via ORAL
  Filled 2014-01-10 (×7): qty 1

## 2014-01-10 MED ORDER — POTASSIUM CHLORIDE 20 MEQ/15ML (10%) PO LIQD
40.0000 meq | Freq: Once | ORAL | Status: AC
Start: 2014-01-10 — End: 2014-01-10
  Administered 2014-01-10: 40 meq via ORAL
  Filled 2014-01-10: qty 30

## 2014-01-10 MED ORDER — NITROGLYCERIN 0.4 MG SL SUBL
0.4000 mg | SUBLINGUAL_TABLET | SUBLINGUAL | Status: DC | PRN
  Administered 2014-01-11: 0.4 mg via SUBLINGUAL
  Filled 2014-01-10: qty 1

## 2014-01-10 MED ORDER — IPRATROPIUM-ALBUTEROL 0.5-2.5 (3) MG/3ML IN SOLN
3.0000 mL | Freq: Once | RESPIRATORY_TRACT | Status: AC
  Administered 2014-01-10: 3 mL via RESPIRATORY_TRACT
  Filled 2014-01-10: qty 3

## 2014-01-10 MED ORDER — ONDANSETRON HCL 4 MG/2ML IJ SOLN
4.0000 mg | Freq: Four times a day (QID) | INTRAMUSCULAR | Status: DC | PRN
  Administered 2014-01-10 – 2014-01-11 (×2): 4 mg via INTRAVENOUS
  Filled 2014-01-10 (×2): qty 2

## 2014-01-10 MED ORDER — SIMVASTATIN 40 MG PO TABS
40.0000 mg | ORAL_TABLET | Freq: Every day | ORAL | Status: DC
  Administered 2014-01-11: 20:00:00 40 mg via ORAL
  Filled 2014-01-10 (×3): qty 1

## 2014-01-10 MED ORDER — POTASSIUM CHLORIDE 10 MEQ/100ML IV SOLN
INTRAVENOUS | Status: AC
  Administered 2014-01-10: 20:00:00
  Filled 2014-01-10: qty 100

## 2014-01-10 MED ORDER — POTASSIUM CHLORIDE CRYS ER 20 MEQ PO TBCR
60.0000 meq | EXTENDED_RELEASE_TABLET | Freq: Once | ORAL | Status: AC
  Administered 2014-01-10: 60 meq via ORAL
  Filled 2014-01-10: qty 3

## 2014-01-10 MED ORDER — HEPARIN BOLUS VIA INFUSION
4000.0000 [IU] | Freq: Once | INTRAVENOUS | Status: AC
  Administered 2014-01-10: 4000 [IU] via INTRAVENOUS

## 2014-01-10 MED ORDER — ALBUTEROL SULFATE (2.5 MG/3ML) 0.083% IN NEBU: 5.0000 mg | INHALATION_SOLUTION | Freq: Once | RESPIRATORY_TRACT | Status: DC

## 2014-01-10 MED ORDER — SODIUM CHLORIDE 0.9 % IJ SOLN: 3.0000 mL | INTRAMUSCULAR | Status: DC | PRN

## 2014-01-10 MED ORDER — ALBUTEROL SULFATE (2.5 MG/3ML) 0.083% IN NEBU
2.5000 mg | INHALATION_SOLUTION | Freq: Once | RESPIRATORY_TRACT | Status: AC
  Administered 2014-01-10: 2.5 mg via RESPIRATORY_TRACT
  Filled 2014-01-10: qty 3

## 2014-01-10 MED ORDER — HEPARIN (PORCINE) IN NACL 100-0.45 UNIT/ML-% IJ SOLN
800.0000 [IU]/h | INTRAMUSCULAR | Status: DC
  Administered 2014-01-10: 700 [IU]/h via INTRAVENOUS
  Administered 2014-01-11: 800 [IU]/h via INTRAVENOUS
  Filled 2014-01-10 (×2): qty 250

## 2014-01-10 MED ORDER — LORAZEPAM 2 MG/ML IJ SOLN
1.0000 mg | Freq: Once | INTRAMUSCULAR | Status: AC
  Administered 2014-01-10: 1 mg via INTRAVENOUS
  Filled 2014-01-10: qty 1

## 2014-01-10 MED ORDER — ASPIRIN EC 81 MG PO TBEC
81.0000 mg | DELAYED_RELEASE_TABLET | Freq: Every day | ORAL | Status: DC
Start: 2014-01-11 — End: 2014-01-12
  Administered 2014-01-11 – 2014-01-12 (×2): 81 mg via ORAL
  Filled 2014-01-10 (×2): qty 1

## 2014-01-10 MED ORDER — SODIUM CHLORIDE 0.9 % IJ SOLN
3.0000 mL | Freq: Two times a day (BID) | INTRAMUSCULAR | Status: DC
  Administered 2014-01-10: 3 mL via INTRAVENOUS

## 2014-01-10 MED ORDER — IPRATROPIUM BROMIDE 0.02 % IN SOLN: 0.5000 mg | Freq: Once | RESPIRATORY_TRACT | Status: DC

## 2014-01-10 MED ORDER — ACETAMINOPHEN 325 MG PO TABS
650.0000 mg | ORAL_TABLET | ORAL | Status: DC | PRN
  Administered 2014-01-10 – 2014-01-11 (×2): 650 mg via ORAL
  Filled 2014-01-10 (×2): qty 2

## 2014-01-10 MED ORDER — ALBUTEROL SULFATE (2.5 MG/3ML) 0.083% IN NEBU
3.0000 mL | INHALATION_SOLUTION | Freq: Four times a day (QID) | RESPIRATORY_TRACT | Status: DC | PRN
  Administered 2014-01-11: 3 mL via RESPIRATORY_TRACT
  Filled 2014-01-10: qty 3

## 2014-01-10 MED ORDER — PANTOPRAZOLE SODIUM 40 MG PO TBEC
40.0000 mg | DELAYED_RELEASE_TABLET | Freq: Every day | ORAL | Status: DC
  Administered 2014-01-11 – 2014-01-12 (×2): 40 mg via ORAL
  Filled 2014-01-10 (×2): qty 1

## 2014-01-10 MED ORDER — LORAZEPAM 1 MG PO TABS
1.0000 mg | ORAL_TABLET | Freq: Two times a day (BID) | ORAL | Status: DC | PRN
  Administered 2014-01-12: 1 mg via ORAL
  Filled 2014-01-10 (×2): qty 1

## 2014-01-10 MED ORDER — ASPIRIN 81 MG PO CHEW
324.0000 mg | CHEWABLE_TABLET | Freq: Once | ORAL | Status: AC
  Administered 2014-01-10: 324 mg via ORAL
  Filled 2014-01-10: qty 4

## 2014-01-10 MED ORDER — SODIUM CHLORIDE 0.9 % IV SOLN: 250.0000 mL | INTRAVENOUS | Status: DC | PRN

## 2014-01-10 NOTE — ED Notes (Signed)
Respiratory called for breathing treatment.

## 2014-01-10 NOTE — ED Notes (Signed)
Patient states shortness of breath for "I don't know how long". Denies recent illness. States she doesn't remember much. When asked about medication she states she had a valium and a "pain pill" and then goes off into a tangent about the police and being arrested. When asked further by RN, patient states she doesn't know why she stated that. Patient alert/oriented x 4, could state birthday, age, month and year correctly.

## 2014-01-10 NOTE — ED Notes (Signed)
States she feels like she is going to die and cannot give any information at present. Placed in bed and on monitor, call light in reach.

## 2014-01-10 NOTE — H&P (Signed)
History and Physical  Patient ID: Victorino DikeJennifer P Garlock MRN: [id number], SOB: 14-Mar-1971 43 y.o. Date of Encounter: 01/10/2014, 10:46 PM  Primary Physician: Toma DeitersHASANAJ,XAJE A, MD Primary Cardiologist: none  Chief Complaint: shortness of breath, chest pressure  HPI: 43 y.o. female w/ PMHx significant for anxiety/depression/?bipolar dz, COPD and continued tobacco abuse who presented initially to [facility] with 5 days of chest pain and shortness of breath. Transfer[facility] to [facility] on 01/10/2014 due to elevated troponin.  Pt is a difficult historian. She has had a lot of anxiety recently. Not taking any meds regularly but took valium and tylenol 3 due to chest pain symptoms. She reports that these symptoms are similar to her usually COPD and in the past resolved with nebulizers. [place] with her breathing difficulties but then 5 days ago, she moved back home and the breathing trouble/chest pressure returned. She went to the ER to get a nebulizer which helps but she no longer has. Has been present for the last 5 days. Gets better with inspiration and use of nebulizer. No radiation, nausea, diaphoresis, leg swellilng. No history of heart issues or clotting issues. Denies cocaine, meth, heroin.   EKG revealed sinus tachycardia with prolonged QT (> 500). CXR was without acute cardiopulmonary abnormalities. Labs are significant for K of 2.9.   Past Medical History  Diagnosis Date  . Anxiety   . Depression   . Asthma   . 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 COPD (chronic obstructive pulmonary disease)   . GERD (gastroesophageal reflux disease)   . Bipolar disorder   . Blood transfusion 2006     Surgical History:  Past Surgical History  Procedure Laterality Date  . Tennis elbow release  04/27/06    left; [facility]o [facility]Harrison, [facility]  . Multiple tooth extractions  10/07    full upper  . Orif proximal humerus fracture  08/06    left,  .  Dilation and curettage of uterus  June 2012    failed ablation  . Tubal ligation  June 2012    [facility]  . Bipolar disorder    . Hysteroscopy  01/27/2011    Procedure: HYSTEROSCOPY;  Surgeon: Tilda BurrowJohn V Ferguson, MD;  Location: [facility];  Service: Gynecology;  Laterality: N/A;  . Vaginal hysterectomy  05/19/2011    Procedure: HYSTERECTOMY VAGINAL;  Surgeon: Tilda BurrowJohn V Ferguson, MD;  Location: [facility];  Service: Gynecology;  Laterality: N/A;  . Orif wrist fracture  06/17/2012    Procedure: OPEN REDUCTION INTERNAL FIXATION (ORIF) WRIST FRACTURE;  Surgeon: Vickki HearingStanley E Harrison, MD;  Location: [facility];  Service: Orthopedics;  Laterality: Left;  . Carpal tunnel release  06/17/2012    Procedure: CARPAL TUNNEL RELEASE;  Surgeon: Vickki HearingStanley E Harrison, MD;  Location: [facility];  Service: Orthopedics;  Laterality: Left;     Home Meds: Prior to Admission medications   Medication Sig Start Date End Date Taking? Authorizing Provider  omeprazole (PRILOSEC) 20 MG capsule Take 20 mg by mouth daily.     Yes Historical Provider, MD    Allergies: No Known Allergies  History   Social History  . Marital Status: Married    Spouse Name: N/A    Number of Children: N/A  . Years of Education: N/A   Occupational History  . Not on file.   Social History Main Topics  . Smoking status: Current Every Day Smoker -- 1.00 packs/day for 22 years  Types: Cigarettes  . Smokeless tobacco: Not on file  . Alcohol Use: No  . Drug Use: No  . Sexual Activity:    Other Topics Concern  . Not on file   Social History Narrative  . No narrative on file     Family History  Problem Relation Age of Onset  . Anesthesia problems Neg Hx   . Hypotension Neg Hx   . Malignant hyperthermia Neg Hx   . Pseudochol deficiency Neg Hx   . Diabetes      Review of Systems: General: negative for chills, fever, night sweats or weight changes.  Cardiovascular: see HPI. Dermatological: negative for rash Respiratory: negative for cough or  wheezing Urologic: negative for hematuria Abdominal: negative for nausea, vomiting, diarrhea, bright red blood per rectum, melena, or hematemesis Neurologic: negative for visual changes, syncope, or dizziness All other systems reviewed and are otherwise negative except as noted above.  Labs:   Lab Results  Component Value Date   WBC 6.9 01/10/2014   HGB 13.8 01/10/2014   HCT 38.7 01/10/2014   MCV 91.3 01/10/2014   PLT 271 01/10/2014    Recent Labs Lab 01/10/14 1727  NA 136*  K 2.9*  CL 95*  CO2 27  BUN 8  CREATININE 0.55  CALCIUM 8.8  GLUCOSE 115*    Recent Labs  01/10/14 1727  TROPONINI 1.58*   No results found for this basename: CHOL, HDL, LDLCALC, TRIG    Radiology/Studies:  Dg Chest Portable 1 View  01/10/2014   CLINICAL DATA:  Shortness of breath, smoking history  EXAM: PORTABLE CHEST - 1 VIEW  COMPARISON:  Chest x-ray of 08/23/2011 and CT chest of the same day  FINDINGS: The lungs are clear but hyperaerated. Mediastinal and hilar contours appear normal. The heart is within normal limits in size. Old right sixth and seventh rib fractures are noted.  IMPRESSION: Hyperaeration.  No active lung disease.   Electronically Signed   By: Dwyane Dee M.D.   On: 01/10/2014 17:01     EKG: sinus tach, QTc of > 500.  Physical Exam: Blood pressure 114/96, pulse 128, temperature 97.9 F (36.6 C), temperature source Oral, resp. rate 26, height 5' 4.5" (1.638 m), weight 49.5 kg (109 lb 2 oz), last menstrual period 04/23/2011, SpO2 100.00%. General: Well developed, well nourished, in no acute distress. Head: Normocephalic, atraumatic, sclera non-icteric, nares are without discharge Neck: Supple. Negative for carotid bruits. JVD not elevated. Lungs: poor air movement but clear Heart: RRR with S1 S2. No murmurs, rubs, or gallops appreciated. Abdomen: Soft, non-tender, non-distended with normoactive bowel sounds. No rebound/guarding. No obvious abdominal masses. Msk:  Strength and  tone appear normal for age. Extremities: No edema. No clubbing or cyanosis. Distal pedal pulses are 2+ and equal bilaterally. Neuro: Alert and oriented X 3. Moves all extremities spontaneously. Psych:  Pressured speech, poor recall,     ASSESSMENT AND PLAN:  Problem List 1. Chest pressure/Dyspnea (though atypical symptoms) and elevated troponin --> r/o NSTEMI 2. Anxiety/bipolar 3. Tobacco abuse 4. Prolonged Qtc 5. Hypokalemia  43 y.o. female w/ PMHx significant for anxiety/depression/?bipolar dz, COPD and continued tobacco abuse who presented initially to [facility] with 5 days of chest pain and shortness of breath. Rather atypical symptoms (improved with inspiration and nebs, constant x 5 days) but elevated troponin is concerning. At this point, will search a bit more broadly for possible causes of symptoms (PE, myocarditis) but with presumptively treat for coronary disease with aspirin, heparin gtt,  low dose BB (no active wheezing), statin. Likely cath will still be necessary.  D-dimer and echo to look for other things on the differential. If + d-dimer, will get PE protocol CT.  Albuterol prn.  Sinus tachycardia likely with anxiety component.  Prolonged QTc. Due to hypokalemia? Replacing aggressively. Recheck in AM.  UDS though denies illicit drug use now.  Smoking cessation.  Full code.  Signed, Georgetta Haber MD 01/10/2014, 10:46 PM

## 2014-01-10 NOTE — ED Provider Notes (Signed)
CSN: [id number]     Arrival date & time 01/10/14  1605 History  This chart was scribed for Raeford RazorStephen Coleta Grosshans, MD by Milly JakobJohn Lee Graves, ED Scribe. The patient was seen in room APA01/APA01. Patient's care was started at 4:42 PM.     Chief Complaint  Patient presents with  . Shortness of Breath   HPI HPI Comments: Crystal Stein is a 43 y.o. female with a history of COPD who presents to the Emergency Department complaining of SOB onset unknown. Patient is a poor historian and is not able to explain what is wrong with her effectively. Keeps trying to defer questions to her father to answer. She reports pressure in her chest associated with SOB. SOB feels different than how she normally feels with her COPD. Does not use home oxygen. She reports associated sleep deprivation, and is unable to say when the last time she slept was. She denies leg pain, leg swelling, cough, fever, and chills.She denies any heart problems or medical problems besides COPD[place]. No known CAD. HAs never had cath or stress testing. Smokes marijuana. Denies cocaine use. No hx of HTN, DM. Is a smoker.  Took valium and hydrocodone earlier today because of her symptoms.    Past Medical History  Diagnosis Date  . Anxiety   . Depression   . Asthma   . 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 COPD (chronic obstructive pulmonary disease)   . GERD (gastroesophageal reflux disease)   . Bipolar disorder   . Blood transfusion 2006   Past Surgical History  Procedure Laterality Date  . Tennis elbow release  04/27/06    left; Romeo AppleHarrison, [facility]  . Multiple tooth extractions  10/07    full upper  . Orif proximal humerus fracture  08/06    left,  . Dilation and curettage of uterus  June 2012    failed ablation  . Tubal ligation  June 2012    Ferguson  . Bipolar disorder    . Hysteroscopy  01/27/2011    Procedure: HYSTEROSCOPY;  Surgeon: Tilda BurrowJohn V Ferguson, MD;  Location: [facility];   Service: Gynecology;  Laterality: N/A;  . Vaginal hysterectomy  05/19/2011    Procedure: HYSTERECTOMY VAGINAL;  Surgeon: Tilda BurrowJohn V Ferguson, MD;  Location: [facility];  Service: Gynecology;  Laterality: N/A;  . Orif wrist fracture  06/17/2012    Procedure: OPEN REDUCTION INTERNAL FIXATION (ORIF) WRIST FRACTURE;  Surgeon: Vickki HearingStanley E Harrison, MD;  Location: [facility];  Service: Orthopedics;  Laterality: Left;  . Carpal tunnel release  06/17/2012    Procedure: CARPAL TUNNEL RELEASE;  Surgeon: Vickki HearingStanley E Harrison, MD;  Location: [facility];  Service: Orthopedics;  Laterality: Left;   Family History  Problem Relation Age of Onset  . Anesthesia problems Neg Hx   . Hypotension Neg Hx   . Malignant hyperthermia Neg Hx   . Pseudochol deficiency Neg Hx   . Diabetes     History  Substance Use Topics  . Smoking status: Current Every Day Smoker -- 1.00 packs/day for 22 years    Types: Cigarettes  . Smokeless tobacco: Not on file  . Alcohol Use: No   OB History   Grav Para Term Preterm Abortions TAB SAB Ect Mult Living                 Review of Systems  All other systems reviewed and are negative.     Allergies  Review of patient's allergies indicates no known allergies.  Home Medications   Prior to Admission medications   Medication Sig Start Date End Date Taking? Authorizing Provider  ALPRAZolam Prudy Feeler) 1 MG tablet Take 1 mg by mouth 4 (four) times daily.    Historical Provider, MD  gabapentin (NEURONTIN) 300 MG capsule Take 300 mg by mouth 3 (three) times daily.    Historical Provider, MD  hydrochlorothiazide (HYDRODIURIL) 25 MG tablet Take 25 mg by mouth daily.    Historical Provider, MD  HYDROcodone-acetaminophen (NORCO) 10-325 MG per tablet Take 1 tablet by mouth every 4 (four) hours as needed. Pain    Historical Provider, MD  omeprazole (PRILOSEC) 20 MG capsule Take 20 mg by mouth daily.      Historical Provider, MD  PARoxetine (PAXIL-CR) 37.5 MG 24 hr tablet Take 75 mg by mouth 2 (two) times  daily.     Historical Provider, MD  valACYclovir (VALTREX) 1000 MG tablet TAKE TWO TABLETS NOW AND TWO TABLET TOMORROW IN THE MORNING IF NEEDED 11/25/12   Adline Potter, NP  ziprasidone (GEODON) 40 MG capsule Take 40 mg by mouth daily.     Historical Provider, MD   Triage Vitals: BP 120/90  Pulse 125  Temp(Src) 97.9 F (36.6 C) (Oral)  Resp 13  Ht 5' 4.5" (1.638 m)  Wt 135 lb (61.236 kg)  BMI 22.82 kg/m2  SpO2 100%  LMP 04/23/2011 Physical Exam  Nursing note and vitals reviewed. Constitutional: She is oriented to person, place, and time. She appears well-developed and well-nourished. No distress.  HENT:  Head: Normocephalic and atraumatic.  Eyes: EOM are normal.  Neck: Normal range of motion.  Cardiovascular: Regular rhythm and normal heart sounds.  Tachycardia present.   Pulmonary/Chest: Effort normal. She has wheezes (bilateral, expiratory).  No accessory muscle usage.   Abdominal: Soft. She exhibits no distension. There is no tenderness.  Musculoskeletal: Normal range of motion.  Lower extremities symmetric as compared to each other. No calf tenderness. Negative Homan's. No palpable cords.   Neurological: She is alert and oriented to person, place, and time.  Skin: Skin is warm and dry.  Psychiatric: Judgment normal.  Very anxious    ED Course  Procedures (including critical care time)  CRITICAL CARE Performed by: Raeford Razor  Total critical care time: 35 minutes  Critical care time was exclusive of separately billable procedures and treating other patients. Critical care was necessary to treat or prevent imminent or life-threatening deterioration. Critical care was time spent personally by me on the following activities: development of treatment plan with patient and/or surrogate as well as nursing, discussions with consultants, evaluation of patient's response to treatment, examination of patient, obtaining history from patient or surrogate, ordering and  performing treatments and interventions, ordering and review of laboratory studies, ordering and review of radiographic studies, pulse oximetry and re-evaluation of patient's condition.  DIAGNOSTIC STUDIES: Oxygen Saturation is 100% on room air, normal by my interpretation.    COORDINATION OF CARE: 4:45 PM-Discussed treatment plan which includes CXR with pt at bedside and pt agreed to plan.   Labs Review Labs Reviewed  BASIC METABOLIC PANEL - Abnormal; Notable for the following:    Sodium 136 (*)    Potassium 2.9 (*)    Chloride 95 (*)    Glucose, Bld 115 (*)    All other components within normal limits  TROPONIN I - Abnormal; Notable for the following:    Troponin I 1.58 (*)    All other  components within normal limits  CBC WITH DIFFERENTIAL  URINE RAPID DRUG SCREEN (HOSP PERFORMED)  HEPARIN LEVEL (UNFRACTIONATED)  CBC  MAGNESIUM  TSH    Imaging Review Dg Chest Portable 1 View  01/10/2014   CLINICAL DATA:  Shortness of breath, smoking history  EXAM: PORTABLE CHEST - 1 VIEW  COMPARISON:  Chest x-ray of 08/23/2011 and CT chest of the same day  FINDINGS: The lungs are clear but hyperaerated. Mediastinal and hilar contours appear normal. The heart is within normal limits in size. Old right sixth and seventh rib fractures are noted.  IMPRESSION: Hyperaeration.  No active lung disease.   Electronically Signed   By: Dwyane Dee M.D.   On: 01/10/2014 17:01     EKG Interpretation None      EKG:  Rhythm: sinus tachycardia Rate: 136 PR: 128 ms QRS: 78ms QTc: 568 ST segments: NS ST changes Comparison: no old   MDM   Final diagnoses:  NSTEMI (non-ST elevated myocardial infarction)  Prolonged Q-T interval on ECG  Hypokalemia  Chronic obstructive pulmonary disease, unspecified COPD, unspecified chronic bronchitis type  Anxiety    43yF with CP and SOB. Pt is poor historian. Unable to give me specifics in terms of timing, aggravating/relieving factors, etc. Tangential  thoughts.. Seems almost manic.  I don't know how much is anxiety/psych. Regardless, troponin is significantly elevated. EKG with prolonged QTc, but no overt ischemic changes. No old for comparison. Denies cocaine abuse. UDS pending. Deferred BB at this time. Consider PE. Described chest pressure is more typical of ACS. No signs/symptoms of DVT nor significant risk factors that I could identify.  o2 sats normal. Tachycardic but extremely anxious and also had albuterol. ASA/heparin. Discussed with Dr Patty Sermons, cardiology. Transfer to [facility].    I personally preformed the services scribed in my presence. The recorded information has been reviewed is accurate. Raeford Razor, MD.    Raeford Razor, MD 01/10/14 [phone]

## 2014-01-10 NOTE — ED Notes (Signed)
Patient with one episode of vomiting in room after PO potassium given. Potassium pills noted in vomit. MD aware.

## 2014-01-10 NOTE — ED Notes (Signed)
CRITICAL VALUE ALERT  Critical value received:  Potassium and Troponin  Date of notification:  01/10/14  Time of notification:  1810  Critical value read back:Yes.    Nurse who received alert:  Aeriel Boulay,RN  MD notified (1st page):  Dr Juleen ChinaKohut  Time of first page:  [phone]

## 2014-01-10 NOTE — Progress Notes (Signed)
ANTICOAGULATION CONSULT NOTE - Initial Consult  Pharmacy Consult for Heparin Indication: chest pain/ACS  No Known Allergies  Patient Measurements: Height: 5' 4.5" (163.8 cm) Weight: 135 lb (61.236 kg) IBW/kg (Calculated) : 55.85  Vital Signs: Temp: 97.9 F (36.6 C) (07/22 1632) Temp src: Oral (07/22 1632) BP: 120/90 mmHg (07/22 1632) Pulse Rate: 125 (07/22 1632)  Labs:  Recent Labs  01/10/14 1727  HGB 13.8  HCT 38.7  PLT 271  CREATININE 0.55  TROPONINI 1.58*    Estimated Creatinine Clearance: 80.8 ml/min (by C-G formula based on Cr of 0.55).   Medical History: Past Medical History  Diagnosis Date  . Anxiety   . Depression   . Asthma   . 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 COPD (chronic obstructive pulmonary disease)   . GERD (gastroesophageal reflux disease)   . Bipolar disorder   . Blood transfusion 2006    Medications:  Scheduled:  . aspirin  324 mg Oral Once  . potassium chloride SA  60 mEq Oral Once    Assessment: 43 yo F who presents to ED with chest pain & shortness of breath.  Troponin elevated.  CBC reviewed.  No bleeding noted.   Goal of Therapy:  Heparin level 0.3-0.7 units/ml Monitor platelets by anticoagulation protocol: Yes   Plan:  Give 4000 units bolus x 1 Start heparin infusion at 700 units/hr Check anti-Xa level in 6 hours and daily while on heparin Continue to monitor H&H and platelets  Isidra Mings, Mercy RidingAndrea Michelle 01/10/2014,6:19 PM

## 2014-01-10 NOTE — ED Notes (Signed)
Patient extremely anxious. Hyperventilating and hr increased to 140

## 2014-01-11 ENCOUNTER — Encounter (HOSPITAL_COMMUNITY)
Admission: EM | Disposition: A | Discharge status: Home / Self Care | Payer: Self-pay | Source: Home / Self Care | Attending: Cardiology

## 2014-01-11 DIAGNOSIS — I369 Nonrheumatic tricuspid valve disorder, unspecified: Secondary | ICD-10-CM

## 2014-01-11 DIAGNOSIS — F319 Bipolar disorder, unspecified: Secondary | ICD-10-CM

## 2014-01-11 DIAGNOSIS — I214 Non-ST elevation (NSTEMI) myocardial infarction: Secondary | ICD-10-CM

## 2014-01-11 DIAGNOSIS — I4581 Long QT syndrome: Secondary | ICD-10-CM

## 2014-01-11 DIAGNOSIS — F17200 Nicotine dependence, unspecified, uncomplicated: Secondary | ICD-10-CM

## 2014-01-11 DIAGNOSIS — E876 Hypokalemia: Secondary | ICD-10-CM

## 2014-01-11 HISTORY — PX: LEFT HEART CATHETERIZATION WITH CORONARY ANGIOGRAM: SHX5451

## 2014-01-11 LAB — CBC
HCT: 36.2 % (ref 36.0–46.0)
Hemoglobin: 12.9 g/dL (ref 12.0–15.0)
MCH: 32.6 pg (ref 26.0–34.0)
MCHC: 35.6 g/dL (ref 30.0–36.0)
MCV: 91.4 fL (ref 78.0–100.0)
Platelets: 264 10*3/uL (ref 150–400)
RBC: 3.96 MIL/uL (ref 3.87–5.11)
RDW: 14.1 % (ref 11.5–15.5)
WBC: 5.8 10*3/uL (ref 4.0–10.5)

## 2014-01-11 LAB — BASIC METABOLIC PANEL
ANION GAP: 15 (ref 5–15)
BUN: 7 mg/dL (ref 6–23)
CHLORIDE: 101 meq/L (ref 96–112)
CO2: 21 meq/L (ref 19–32)
CREATININE: 0.49 mg/dL — AB (ref 0.50–1.10)
Calcium: 8.2 mg/dL — ABNORMAL LOW (ref 8.4–10.5)
GFR calc Af Amer: >90 mL/min (ref >90)
GFR calc non Af Amer: >90 mL/min (ref >90)
Glucose, Bld: 93 mg/dL (ref 70–99)
POTASSIUM: 4.1 meq/L (ref 3.7–5.3)
SODIUM: 137 meq/L (ref 137–147)

## 2014-01-11 LAB — LIPID PANEL
Cholesterol: 147 mg/dL (ref 0–200)
HDL: 29 mg/dL — ABNORMAL LOW (ref >39)
LDL CALC: 84 mg/dL (ref 0–99)
Total CHOL/HDL Ratio: 5.1 RATIO
Triglycerides: 171 mg/dL — ABNORMAL HIGH (ref <150)
VLDL: 34 mg/dL (ref 0–40)

## 2014-01-11 LAB — TSH: TSH: 1.38 u[IU]/mL (ref 0.350–4.500)

## 2014-01-11 LAB — PRO B NATRIURETIC PEPTIDE: PRO B NATRI PEPTIDE: 259.3 pg/mL — AB (ref 0–125)

## 2014-01-11 LAB — D-DIMER, QUANTITATIVE (NOT AT ARMC): D DIMER QUANT: 1.13 ug{FEU}/mL — AB (ref 0.00–0.48)

## 2014-01-11 LAB — PROTIME-INR
INR: 0.97 (ref 0.00–1.49)
Prothrombin Time: 12.9 seconds (ref 11.6–15.2)

## 2014-01-11 LAB — TROPONIN I
TROPONIN I: 1.09 ng/mL — AB (ref <0.30)
Troponin I: 1.06 ng/mL (ref <0.30)

## 2014-01-11 LAB — POCT ACTIVATED CLOTTING TIME: Activated Clotting Time: 107 seconds

## 2014-01-11 LAB — HEPARIN LEVEL (UNFRACTIONATED)
HEPARIN UNFRACTIONATED: 0.27 [IU]/mL — AB (ref 0.30–0.70)
Heparin Unfractionated: 0.33 IU/mL (ref 0.30–0.70)

## 2014-01-11 SURGERY — LEFT HEART CATHETERIZATION WITH CORONARY ANGIOGRAM
Anesthesia: LOCAL

## 2014-01-11 MED ORDER — SODIUM CHLORIDE 0.9 % IV SOLN: 250.0000 mL | INTRAVENOUS | Status: DC | PRN

## 2014-01-11 MED ORDER — SODIUM CHLORIDE 0.9 % IJ SOLN: 3.0000 mL | Freq: Two times a day (BID) | INTRAMUSCULAR | Status: DC

## 2014-01-11 MED ORDER — MORPHINE SULFATE 2 MG/ML IJ SOLN
2.0000 mg | INTRAMUSCULAR | Status: DC | PRN
  Administered 2014-01-11 – 2014-01-12 (×2): 2 mg via INTRAVENOUS
  Filled 2014-01-11 (×2): qty 1

## 2014-01-11 MED ORDER — MIDAZOLAM HCL 2 MG/2ML IJ SOLN
INTRAMUSCULAR | Status: AC
  Filled 2014-01-11: qty 2

## 2014-01-11 MED ORDER — ISOSORBIDE MONONITRATE ER 30 MG PO TB24
30.0000 mg | ORAL_TABLET | Freq: Every day | ORAL | Status: DC
  Administered 2014-01-11 – 2014-01-12 (×2): 30 mg via ORAL
  Filled 2014-01-11 (×3): qty 1

## 2014-01-11 MED ORDER — SODIUM CHLORIDE 0.9 % IV SOLN: 1.0000 mL/kg/h | INTRAVENOUS | Status: AC

## 2014-01-11 MED ORDER — ASPIRIN 81 MG PO CHEW: 81.0000 mg | CHEWABLE_TABLET | ORAL | Status: DC

## 2014-01-11 MED ORDER — FENTANYL CITRATE 0.05 MG/ML IJ SOLN
INTRAMUSCULAR | Status: AC
  Filled 2014-01-11: qty 2

## 2014-01-11 MED ORDER — SODIUM CHLORIDE 0.9 % IJ SOLN: 3.0000 mL | INTRAMUSCULAR | Status: DC | PRN

## 2014-01-11 MED ORDER — HEPARIN (PORCINE) IN NACL 2-0.9 UNIT/ML-% IJ SOLN
INTRAMUSCULAR | Status: AC
  Filled 2014-01-11: qty 1500

## 2014-01-11 MED ORDER — NITROGLYCERIN 1 MG/10 ML FOR IR/CATH LAB
INTRA_ARTERIAL | Status: AC
  Filled 2014-01-11: qty 10

## 2014-01-11 MED ORDER — LIDOCAINE HCL (PF) 1 % IJ SOLN
INTRAMUSCULAR | Status: AC
  Filled 2014-01-11: qty 30

## 2014-01-11 MED ORDER — SODIUM CHLORIDE 0.9 % IV SOLN
INTRAVENOUS | Status: DC
  Administered 2014-01-11: 75 mL/h via INTRAVENOUS

## 2014-01-11 NOTE — Progress Notes (Signed)
*  PRELIMINARY RESULTS* Echocardiogram 2D Echocardiogram has been performed.  Crystal Stein, Crystal Stein 01/11/2014, 10:22 AM

## 2014-01-11 NOTE — Care Management Note (Signed)
    Page 1 of 1   01/11/2014     8:54:15 AM CARE MANAGEMENT NOTE 01/11/2014  Patient:  Crystal Stein,Crystal Stein   Account Number:  [id number]  Date Initiated:  01/11/2014  Documentation initiated by:  Junius CreamerWELL,DEBBIE  Subjective/Objective Assessment:   adm w mi     Action/Plan:   lives w husband, pcp dr x hasanji   Anticipated DC Date:     Anticipated DC Plan:           Choice offered to / List presented to:             Status of service:   Medicare Important Message given?   (If response is "NO", the following Medicare IM given date fields will be blank) Date Medicare IM given:   Medicare IM given by:   Date Additional Medicare IM given:   Additional Medicare IM given by:    Discharge Disposition:    Per UR Regulation:  Reviewed for med. necessity/level of care/duration of stay  If discussed at Long Length of Stay Meetings, dates discussed:    Comments:

## 2014-01-11 NOTE — Progress Notes (Signed)
ANTICOAGULATION CONSULT NOTE - Follow Up Consult  Pharmacy Consult for heparin Indication: ACS, possible PE  Labs:  Recent Labs  01/10/14 1727 01/11/14 0500 01/11/14 0610 01/11/14 0644 01/11/14 1105 01/11/14 1330  HGB 13.8  --   --  12.9  --   --   HCT 38.7  --   --  36.2  --   --   PLT 271  --   --  264  --   --   HEPARINUNFRC  --   --   --  0.27*  --  0.33  CREATININE 0.55 0.49*  --   --   --   --   TROPONINI 1.58*  --  1.09*  --  1.06*  --     Assessment: 43yo female subtherapeutic on heparin with initial dosing for ACS and possible PE. Heparin is noted at goal (HL= 0.33) on 800 units/hr. Patient noted for cath today  Goal of Therapy:  Heparin level 0.3-0.7 units/ml   Plan:  -No heparin changes needed -Will follow plans post cath  Harland Germanndrew Ekta Dancer, Pharm D 01/11/2014 2:38 PM

## 2014-01-11 NOTE — H&P (View-Only) (Signed)
Subjective:  Still having chest pressure. At times feels like elephant on chest. Challenging to obtain a clear history because of memory impairment.  Objective:  Vital Signs in the last 24 hours: Temp:  [97.9 F (36.6 C)-99.1 F (37.3 C)] 98.4 F (36.9 C) (07/23 0745) Pulse Rate:  [94-138] 105 (07/23 0700) Resp:  [10-29] 15 (07/23 0745) BP: (88-153)/(55-125) 111/85 mmHg (07/23 0745) SpO2:  [93 %-100 %] 100 % (07/23 0745) Weight:  [109 lb 2 oz (49.5 kg)-135 lb (61.236 kg)] 109 lb 2 oz (49.5 kg) (07/22 2129)  Intake/Output from previous day: 07/22 0701 - 07/23 0700 In: 79.9 [I.V.:79.9] Out: -    Physical Exam: General: In no acute distress. Thin, looks older than stated age. Head:  Normocephalic and atraumatic. Lungs: Clear to auscultation and percussion, mild scattered wheezes. Heart: Normal S1 and S2. Mildly tachycardic. No murmur, rubs or gallops.  Abdomen: soft, non-tender, positive bowel sounds. Extremities: No clubbing or cyanosis. No edema. Palpable but small intensity radial pulses. Neurologic: Alert and oriented x 3.    Lab Results:  Recent Labs  01/10/14 1727 01/11/14 0644  WBC 6.9 5.8  HGB 13.8 12.9  PLT 271 264    Recent Labs  01/10/14 1727 01/11/14 0500  NA 136* 137  K 2.9* 4.1  CL 95* 101  CO2 27 21  GLUCOSE 115* 93  BUN 8 7  CREATININE 0.55 0.49*    Recent Labs  01/10/14 1727 01/11/14 0610  TROPONINI 1.58* 1.09*     Recent Labs  01/11/14 0610  CHOL 147    Imaging: Dg Chest Portable 1 View  01/10/2014   CLINICAL DATA:  Shortness of breath, smoking history  EXAM: PORTABLE CHEST - 1 VIEW  COMPARISON:  Chest x-ray of 08/23/2011 and CT chest of the same day  FINDINGS: The lungs are clear but hyperaerated. Mediastinal and hilar contours appear normal. The heart is within normal limits in size. Old right sixth and seventh rib fractures are noted.  IMPRESSION: Hyperaeration.  No active lung disease.   Electronically Signed   By: Dwyane DeePaul   Barry M.D.   On: 01/10/2014 17:01   Personally viewed.   Telemetry: No adverse arrhythmias, sinus tachycardia Personally viewed.   EKG:  Sinus tachycardia, QTC approximately 500, no obvious ST segment changes  Cardiac Studies:  Echocardiogram-pulmonary leg, normal ejection fraction with possible septal wall hypokinesis. Right ventricle appears to be functioning normal.  Assessment/Plan:  Active Problems:   NSTEMI (non-ST elevated myocardial infarction)   43 year old female with extensive tobacco history, early COPD with chest discomfort/ongoing, elevated troponin concerning for non-ST elevation myocardial infarction.  1. Non-ST elevation myocardial infarction-subjectively, she states that she is felt "elephant sitting on her chest "off and on and she admits to difficulties with her memory because of previous drug issues. She denies any recent drug abuse. Other possibilities include pulmonary embolism however we will go ahead and exclude coronary artery disease. Given her extensive tobacco history, looks older than stated age, prior drug use she could have had accelerated atherosclerosis. If coronary arteries are unremarkable, a CT angiogram could be performed to exclude pulmonary embolism. She denies any recent unilateral leg edema. No prior history of thrombosis.  Risks and benefits of cardiac catheterization including stroke, heart attack, death, bleeding, renal impairment have been discussed. She is willing to proceed.  2. Tobacco abuse-tobacco cessation counseling  3. COPD-continue with current medical care.  4. Prolonged QT-try to avoid excessive use of QT prolonging agents. Previous hypokalemia of  2.9 has been corrected.  5. Bipolar disorder/anxiety/depression-currently appears stable. Admits to memory impairment.  6. Prior history of drug abuse-no cocaine on urine tox  Immaculate Crutcher 01/11/2014, 10:40 AM

## 2014-01-11 NOTE — Interval H&P Note (Signed)
History and Physical Interval Note:  01/11/2014 3:21 PM  Crystal Stein  has presented today for surgery, with the diagnosis of NSTEMI  The various methods of treatment have been discussed with the patient and family. After consideration of risks, benefits and other options for treatment, the patient has consented to  Procedure(s): LEFT HEART CATHETERIZATION WITH CORONARY ANGIOGRAM (N/A) as a surgical intervention .  The patient's history has been reviewed, patient examined, no change in status, stable for surgery.  I have reviewed the patient's chart and labs.  Questions were answered to the patient's satisfaction.     HARDING,DAVID W  Cath Lab Visit (complete for each Cath Lab visit)  Clinical Evaluation Leading to the Procedure:   ACS: Yes.    Non-ACS:    Anginal Classification: CCS IV  Anti-ischemic medical therapy: Minimal Therapy (1 class of medications)  Non-Invasive Test Results: No non-invasive testing performed  Prior CABG: No previous CABG

## 2014-01-11 NOTE — CV Procedure (Signed)
CARDIAC CATHETERIZATION REPORT  NAME:  Crystal Stein   MRN: [id number] DOB:  1970-11-28   ADMIT DATE: 01/10/2014 Procedure Date: 01/11/2014  INTERVENTIONAL CARDIOLOGIST: Marykay Lex, M.D., MS PRIMARY CARE PROVIDER: Toma Deiters, MD PRIMARY CARDIOLOGIST: Donato Schultz, MD  PATIENT:  Crystal Stein is a 43 y.o. female with a long-standing history of smoking calculated now by COPD as well as anxiety and depression who presented to [facility] with 5 days of chest pain and shortness of breath. Transferred to [facility] on 01/10/2014 due to elevated troponin.  She is continued to note symptoms of an elephant sitting on her chest. She is now referred for cardiac catheterization with a diagnosis of Non-STEMI.  PRE-OPERATIVE DIAGNOSIS:    NonSTEMI    PROCEDURES PERFORMED:    Left Heart Catheterization with Native Coronary Angiography  via Right Common Femoral  Artery   Left Ventriculography  PROCEDURE: The patient was brought to the 2nd Floor [place] [facility] in the fasting state and prepped and draped in the usual sterile fashion for Right Common Femoral  artery access. A modified Allen's test was performed on the right  wrist demonstrating  inadequate collateral flow for radial access.   Sterile technique was used including antiseptics, cap, gloves, gown, hand hygiene, mask and sheet. Skin prep: Chlorhexidine.   Consent: Risks of procedure as well as the alternatives and risks of each were explained to the (patient/caregiver). Consent for procedure obtained.   Time Out: Verified patient identification, verified procedure, site/side was marked, verified correct patient position, special equipment/implants available, medications/allergies/relevent history reviewed, required imaging and test results available. Performed.  Access:   Right Common Femoral  Artery: 5  Fr Sheath -  fluoroscopically guided modified Seldinger Technique  Left Heart  Catheterization: 5Fr Catheters advanced or exchanged over a standard J-wire; JL4  catheter advanced first.  Left Coronary Artery Cineangiography: JL4  Catheter  Right Coronary Artery Cineangiography: JR 4  Catheter   LV Hemodynamics (LV Gram): Angled pigtail   Sheath removed in the Cath Lab  with manual pressure for hemostasis.   FINDINGS:  Hemodynamics:   Central Aortic Pressure / Mean: 118/80/98  mmHg  Left Ventricular Pressure / LVEDP: 120/3/10  mmHg  Left Ventriculography:  EF: 65-70 %  Wall Motion: Hyperdynamic   Coronary Anatomy:  Dominance: Right   Left Main: Large caliber, short vessel that the LAD and Circumflex. Angiographically normal.  LAD: Large caliber vessel with 2 small moderate caliber diagonal branches. It tapers distally around the apex. Minimal luminal irregularities. The distal LAD is very tortuous  Left Circumflex: Large caliber, nondominant vessel that courses as a lateral OM that has a small small AV groove branch. Tortuous but free of significant disease.    RCA: Large caliber, dominant vessel that is angiographically normal. There is a hairpin turn after the Crux of the vessel that has significant systolic pinching giving the appearance of myocardial bridging. The vessel bifurcates distally into the Right Posterior Descending Artery (RPDA) and the Right Posterior AV Groove Branch (RPAV).  RPDA: Small-to-moderate caliber vessel which reaches two thirds with the apex. Tortuous and free of disease.   RPL Sysytem:The RPAV small-to- moderate branch gives off 2 small caliber posterolateral rashes. Minimal luminal irregularities.    MEDICATIONS:  Anesthesia:  Local Lidocaine  14 ml  Sedation:   2 mg IV Versed,  100 mcg IV fentanyl ;   Omnipaque Contrast:  70 ml  PATIENT DISPOSITION:    The  patient was transferred to the PACU holding area in a hemodynamicaly stable, chest pain free condition.  The patient tolerated the procedure well, and there were  no complications.  EBL:   < 10 ml  The patient was stable before, during, and after the procedure.  POST-OPERATIVE DIAGNOSIS:    Angiographically normal Coronary Arteries with no obvious culprit lesion. Extremely tortuous vessels with distal tapering that are easy potential targets for spasm.  Hyperdynamic left ventricular function with normal EF and normal LVEDP  PLAN OF CARE:  Transferred to Post Procedure Unit for postcatheterization care.  Will start Imdur, and consider beta blocker prior to discharge.  I would anticipate that she can be be discharged in the morning.  [place]   Marykay LexHARDING,DAVID W, M.D., M.S. [facility] [address] [place], [place]  [id number]  [phone]  01/11/2014 4:40 PM

## 2014-01-11 NOTE — Progress Notes (Signed)
Subjective:  Still having chest pressure. At times feels like elephant on chest. Challenging to obtain a clear history because of memory impairment.  Objective:  Vital Signs in the last 24 hours: Temp:  [97.9 F (36.6 C)-99.1 F (37.3 C)] 98.4 F (36.9 C) (07/23 0745) Pulse Rate:  [94-138] 105 (07/23 0700) Resp:  [10-29] 15 (07/23 0745) BP: (88-153)/(55-125) 111/85 mmHg (07/23 0745) SpO2:  [93 %-100 %] 100 % (07/23 0745) Weight:  [109 lb 2 oz (49.5 kg)-135 lb (61.236 kg)] 109 lb 2 oz (49.5 kg) (07/22 2129)  Intake/Output from previous day: 07/22 0701 - 07/23 0700 In: 79.9 [I.V.:79.9] Out: -    Physical Exam: General: In no acute distress. Thin, looks older than stated age. Head:  Normocephalic and atraumatic. Lungs: Clear to auscultation and percussion, mild scattered wheezes. Heart: Normal S1 and S2. Mildly tachycardic. No murmur, rubs or gallops.  Abdomen: soft, non-tender, positive bowel sounds. Extremities: No clubbing or cyanosis. No edema. Palpable but small intensity radial pulses. Neurologic: Alert and oriented x 3.    Lab Results:  Recent Labs  01/10/14 1727 01/11/14 0644  WBC 6.9 5.8  HGB 13.8 12.9  PLT 271 264    Recent Labs  01/10/14 1727 01/11/14 0500  NA 136* 137  K 2.9* 4.1  CL 95* 101  CO2 27 21  GLUCOSE 115* 93  BUN 8 7  CREATININE 0.55 0.49*    Recent Labs  01/10/14 1727 01/11/14 0610  TROPONINI 1.58* 1.09*     Recent Labs  01/11/14 0610  CHOL 147    Imaging: Dg Chest Portable 1 View  01/10/2014   CLINICAL DATA:  Shortness of breath, smoking history  EXAM: PORTABLE CHEST - 1 VIEW  COMPARISON:  Chest x-ray of 08/23/2011 and CT chest of the same day  FINDINGS: The lungs are clear but hyperaerated. Mediastinal and hilar contours appear normal. The heart is within normal limits in size. Old right sixth and seventh rib fractures are noted.  IMPRESSION: Hyperaeration.  No active lung disease.   Electronically Signed   By: Dwyane DeePaul   Barry M.D.   On: 01/10/2014 17:01   Personally viewed.   Telemetry: No adverse arrhythmias, sinus tachycardia Personally viewed.   EKG:  Sinus tachycardia, QTC approximately 500, no obvious ST segment changes  Cardiac Studies:  Echocardiogram-pulmonary leg, normal ejection fraction with possible septal wall hypokinesis. Right ventricle appears to be functioning normal.  Assessment/Plan:  Active Problems:   NSTEMI (non-ST elevated myocardial infarction)   43 year old female with extensive tobacco history, early COPD with chest discomfort/ongoing, elevated troponin concerning for non-ST elevation myocardial infarction.  1. Non-ST elevation myocardial infarction-subjectively, she states that she is felt "elephant sitting on her chest "off and on and she admits to difficulties with her memory because of previous drug issues. She denies any recent drug abuse. Other possibilities include pulmonary embolism however we will go ahead and exclude coronary artery disease. Given her extensive tobacco history, looks older than stated age, prior drug use she could have had accelerated atherosclerosis. If coronary arteries are unremarkable, a CT angiogram could be performed to exclude pulmonary embolism. She denies any recent unilateral leg edema. No prior history of thrombosis.  Risks and benefits of cardiac catheterization including stroke, heart attack, death, bleeding, renal impairment have been discussed. She is willing to proceed.  2. Tobacco abuse-tobacco cessation counseling  3. COPD-continue with current medical care.  4. Prolonged QT-try to avoid excessive use of QT prolonging agents. Previous hypokalemia of  2.9 has been corrected.  5. Bipolar disorder/anxiety/depression-currently appears stable. Admits to memory impairment.  6. Prior history of drug abuse-no cocaine on urine tox  Crystal Stein 01/11/2014, 10:40 AM

## 2014-01-11 NOTE — Progress Notes (Signed)
ANTICOAGULATION CONSULT NOTE - Follow Up Consult  Pharmacy Consult for heparin Indication: r/o ACS vs PE  Labs:  Recent Labs  01/10/14 1727 01/11/14 0610 01/11/14 0644  HGB 13.8  --  12.9  HCT 38.7  --  36.2  PLT 271  --  264  HEPARINUNFRC  --   --  0.27*  CREATININE 0.55  --   --   TROPONINI 1.58* 1.09*  --     Assessment: 43yo female subtherapeutic on heparin with initial dosing for ACS vs PE.  Goal of Therapy:  Heparin level 0.3-0.7 units/ml   Plan:  Will increase heparin gtt by 2 units/kg/hr to 800 units/hr and check level in 6hr.  Vernard GamblesVeronda Ranyia Witting, PharmD, BCPS  01/11/2014,7:15 AM

## 2014-01-12 DIAGNOSIS — Z720 Tobacco use: Secondary | ICD-10-CM | POA: Diagnosis present

## 2014-01-12 DIAGNOSIS — E785 Hyperlipidemia, unspecified: Secondary | ICD-10-CM | POA: Diagnosis present

## 2014-01-12 LAB — CBC
HCT: 29.2 % — ABNORMAL LOW (ref 36.0–46.0)
Hemoglobin: 10.2 g/dL — ABNORMAL LOW (ref 12.0–15.0)
MCH: 33.1 pg (ref 26.0–34.0)
MCHC: 34.9 g/dL (ref 30.0–36.0)
MCV: 94.8 fL (ref 78.0–100.0)
PLATELETS: 232 10*3/uL (ref 150–400)
RBC: 3.08 MIL/uL — ABNORMAL LOW (ref 3.87–5.11)
RDW: 14.5 % (ref 11.5–15.5)
WBC: 4.7 10*3/uL (ref 4.0–10.5)

## 2014-01-12 MED ORDER — ISOSORBIDE MONONITRATE ER 30 MG PO TB24: 30.0000 mg | ORAL_TABLET | Freq: Every day | ORAL | Status: DC

## 2014-01-12 MED ORDER — NITROGLYCERIN 0.4 MG SL SUBL: 0.4000 mg | SUBLINGUAL_TABLET | SUBLINGUAL | Status: AC | PRN

## 2014-01-12 MED ORDER — METOPROLOL TARTRATE 25 MG PO TABS: 25.0000 mg | ORAL_TABLET | Freq: Two times a day (BID) | ORAL | Status: DC

## 2014-01-12 NOTE — Progress Notes (Signed)
Subjective: No CP  Objective: Vital signs in last 24 hours: Temp:  [97.8 F (36.6 C)-98.6 F (37 C)] 98.2 F (36.8 C) (07/24 0423) Pulse Rate:  [99-117] 117 (07/24 0423) Resp:  [10-28] 20 (07/24 0423) BP: (91-126)/(68-103) 115/92 mmHg (07/24 0423) SpO2:  [96 %-100 %] 99 % (07/24 0423) Weight:  [119 lb 14.9 oz (54.4 kg)] 119 lb 14.9 oz (54.4 kg) (07/24 0000) Last BM Date:  (can't remember, states doesn't feel constipated)  Intake/Output from previous day: 07/23 0701 - 07/24 0700 In: 760 [P.O.:60; I.V.:700] Out: 650 [Urine:650] Intake/Output this shift: Total I/O In: 250 [I.V.:250] Out: -   Medications Current Facility-Administered Medications  Medication Dose Route Frequency Provider Last Rate Last Dose  . 0.9 %  sodium chloride infusion  250 mL Intravenous PRN Ardis Rowan, MD      . 0.9 %  sodium chloride infusion  250 mL Intravenous PRN Marykay Lex, MD      . acetaminophen (TYLENOL) tablet 650 mg  650 mg Oral Q4H PRN Ardis Rowan, MD   650 mg at 01/11/14 4540  . albuterol (PROVENTIL) (2.5 MG/3ML) 0.083% nebulizer solution 3 mL  3 mL Inhalation Q6H PRN Ardis Rowan, MD   3 mL at 01/11/14 1218  . aspirin EC tablet 81 mg  81 mg Oral Daily Ardis Rowan, MD   81 mg at 01/11/14 9811  . isosorbide mononitrate (IMDUR) 24 hr tablet 30 mg  30 mg Oral Daily Marykay Lex, MD   30 mg at 01/11/14 2010  . LORazepam (ATIVAN) tablet 1 mg  1 mg Oral BID PRN Ardis Rowan, MD   1 mg at 01/12/14 0105  . metoprolol tartrate (LOPRESSOR) tablet 25 mg  25 mg Oral BID Ardis Rowan, MD   25 mg at 01/11/14 2010  . morphine 2 MG/ML injection 2 mg  2 mg Intravenous Q1H PRN Marykay Lex, MD   2 mg at 01/11/14 2130  . nitroGLYCERIN (NITROSTAT) SL tablet 0.4 mg  0.4 mg Sublingual Q5 Min x 3 PRN Ardis Rowan, MD   0.4 mg at 01/11/14 0636  . ondansetron (ZOFRAN) injection 4 mg  4 mg Intravenous Q6H PRN Ardis Rowan, MD   4 mg at 01/11/14 1850  . pantoprazole  (PROTONIX) EC tablet 40 mg  40 mg Oral Daily Ardis Rowan, MD   40 mg at 01/11/14 9147  . simvastatin (ZOCOR) tablet 40 mg  40 mg Oral q1800 Ardis Rowan, MD   40 mg at 01/11/14 2010  . sodium chloride 0.9 % injection 3 mL  3 mL Intravenous Q12H Ardis Rowan, MD   3 mL at 01/10/14 2330  . sodium chloride 0.9 % injection 3 mL  3 mL Intravenous PRN Ardis Rowan, MD      . sodium chloride 0.9 % injection 3 mL  3 mL Intravenous Q12H Marykay Lex, MD      . sodium chloride 0.9 % injection 3 mL  3 mL Intravenous PRN Marykay Lex, MD        PE: General appearance: alert, cooperative and no distress Lungs: clear to auscultation bilaterally Heart: reg rhythm.  Rate fast.  No MM Extremities: No LEE Pulses: 2+ and symmetric Skin: Earm and dry. Ecchymosis on the right anterior forearm. Neurologic: Grossly normal  Lab Results:   Recent Labs  01/10/14 1727 01/11/14 0644 01/12/14 0300  WBC 6.9 5.8 4.7  HGB 13.8 12.9 10.2*  HCT 38.7 36.2 29.2*  PLT 271 264 232  BMET  Recent Labs  01/10/14 1727 01/11/14 0500  NA 136* 137  K 2.9* 4.1  CL 95* 101  CO2 27 21  GLUCOSE 115* 93  BUN 8 7  CREATININE 0.55 0.49*  CALCIUM 8.8 8.2*   PT/INR  Recent Labs  01/11/14 1330  LABPROT 12.9  INR 0.97   Cholesterol  Recent Labs  01/11/14 0610  CHOL 147   Lipid Panel     Component Value Date/Time   CHOL 147 01/11/2014 0610   TRIG 171* 01/11/2014 0610   HDL 29* 01/11/2014 0610   CHOLHDL 5.1 01/11/2014 0610   VLDL 34 01/11/2014 0610   LDLCALC 84 01/11/2014 0610   Cardiac Panel (last 3 results)  Recent Labs  01/10/14 1727 01/11/14 0610 01/11/14 1105  TROPONINI 1.58* 1.09* 1.06*     Assessment/Plan   Active Problems:   NSTEMI (non-ST elevated myocardial infarction)  Plan: SP left heart cath revealing Angiographically normal Coronary Arteries with no obvious culprit lesion. Extremely tortuous vessels with distal tapering that are easy potential targets for  spasm. Hyperdynamic left ventricular function with normal EF and normal LVEDP.  Imdur added.  Some mild hypotension and tachycardia at rest.  Appears anxious.  No room for BB.    LOS: 2 days    HAGER, BRYAN PA-C 01/12/2014 6:34 AM  I have personally seen and examined this patient with Wilburt FinlayBryan Hager, PA-C. I agree with the assessment and plan as outlined above. No angiographic evidence of CAD. Pt is very anxious and has many active social issues with her family/kids/living situation. There could be a component of vasospasm. She is now on Imdur. Discharge home today. Follow up in 2-3 weeks with Dr. Anne FuSkains or office PA/NP.   Wyatte Dames 01/12/2014 7:23 AM

## 2014-01-12 NOTE — Discharge Summary (Signed)
See full note this am. cdm 

## 2014-01-12 NOTE — Discharge Summary (Signed)
Physician Discharge Summary     Cardiologist:  New(Skains) Lives in [place]   Patient ID: Crystal Stein MRN: [id number] DOB/AGE: 42-Jan-1972 42 y.o.  Admit date: 01/10/2014 Discharge date: 01/12/2014  Admission Diagnoses:  NSTEMI  Discharge Diagnoses:  Active Problems:   NSTEMI (non-ST elevated myocardial infarction)   Tobacco abuse   Dyslipidemia   Hypokalemia   Prolonged QTc   Sinus tachycardia   COPD  Discharged Condition: stable  Hospital Course:   43 y.o. female w/ PMHx significant for anxiety/depression/?bipolar dz, COPD and continued tobacco abuse who presented initially to [facility] with 5 days of chest pain and shortness of breath. Transferred to [facility] on 01/10/2014 due to elevated troponin.   Pt is a difficult historian. She has had a lot of anxiety recently. Not taking any meds regularly but took valium and tylenol 3 due to chest pain symptoms. She reports that these symptoms are similar to her usually COPD and in the past resolved with nebulizers. [place] with her breathing difficulties but then 5 days ago, she moved back home and the breathing trouble/chest pressure returned. She went to the ER to get a nebulizer which helps but she no longer has. Has been present for the last 5 days. Gets better with inspiration and use of nebulizer. No radiation, nausea, diaphoresis, leg swellilng. No history of heart issues or clotting issues. Denies cocaine, meth, heroin.  EKG revealed sinus tachycardia with prolonged QT (> 500). CXR was without acute cardiopulmonary abnormalities. Labs are significant for K of 2.9.   The patient was admitted and ruled in for NSTEMI with peak troponin of 1.58.   She was placed on IV heparin and went for a left heart cath which revealed angiographically normal Coronary Arteries with no obvious culprit lesion. Extremely tortuous vessels with distal tapering that are easy potential targets for  spasm. Hyperdynamic left ventricular function with normal EF and normal LVEDP. Imdur added.  Statin was DCd.  Some mild hypotension and tachycardia at rest with no tachypnea. Potassium was replaced to WNL.  Tobacco cessation was discussed.  Low normal EF by echo and normal RV size and thickness. Lopressor was started at 25mg  BID.  The patient was seen by Dr. Clifton James who felt she was stable for DC home.   Consults: None  Significant Diagnostic Studies:   FINDINGS:  Hemodynamics:  Central Aortic Pressure / Mean: 118/80/98 mmHg  Left Ventricular Pressure / LVEDP: 120/3/10 mmHg Left Ventriculography:  EF: 65-70 %  Wall Motion: Hyperdynamic  Coronary Anatomy:  Dominance: Right  Left Main: Large caliber, short vessel that the LAD and Circumflex. Angiographically normal.  LAD: Large caliber vessel with 2 small moderate caliber diagonal branches. It tapers distally around the apex. Minimal luminal irregularities. The distal LAD is very tortuous  Left Circumflex: Large caliber, nondominant vessel that courses as a lateral OM that has a small small AV groove branch. Tortuous but free of significant disease.  RCA: Large caliber, dominant vessel that is angiographically normal. There is a hairpin turn after the Crux of the vessel that has significant systolic pinching giving the appearance of myocardial bridging. The vessel bifurcates distally into the Right Posterior Descending Artery (RPDA) and the Right Posterior AV Groove Branch (RPAV).  RPDA: Small-to-moderate caliber vessel which reaches two thirds with the apex. Tortuous and free of disease.  RPL Sysytem:The RPAV small-to- moderate branch gives off 2 small caliber posterolateral rashes. Minimal luminal irregularities.  MEDICATIONS:  Anesthesia: Local Lidocaine 14 ml Sedation: 2 mg IV Versed, 100 mcg IV fentanyl ;  Omnipaque Contrast: 70 ml PATIENT DISPOSITION:  The patient was transferred to the PACU holding area in a hemodynamicaly stable,  chest pain free condition.  The patient tolerated the procedure well, and there were no complications. EBL: < 10 ml  The patient was stable before, during, and after the procedure. POST-OPERATIVE DIAGNOSIS:  Angiographically normal Coronary Arteries with no obvious culprit lesion. Extremely tortuous vessels with distal tapering that are easy potential targets for spasm.  Hyperdynamic left ventricular function with normal EF and normal LVEDP PLAN OF CARE:  Transferred to Post Procedure Unit for postcatheterization care.  Will start Imdur, and consider beta blocker prior to discharge.  I would anticipate that she can be be discharged in the morning.  Will need followup, likely in the [place], [place] area Marykay Lex, M.D., M.S.  [facility]  [address]  [place], [place] [address]  [phone]  01/11/2014  4:40 PM    2D echo Study Conclusions  - Left ventricle: Poor endocardial definition septal and inferior apical hypokinesis. The cavity size was normal. Wall thickness was normal. Systolic function was normal. The estimated ejection fraction was in the range of 50% to 55%. - Atrial septum: No defect or patent foramen ovale was identified.  Lipid Panel     Component Value Date/Time   CHOL 147 01/11/2014 0610   TRIG 171* 01/11/2014 0610   HDL 29* 01/11/2014 0610   CHOLHDL 5.1 01/11/2014 0610   VLDL 34 01/11/2014 0610   LDLCALC 84 01/11/2014 0610     Cardiac Panel (last 3 results)  Recent Labs  01/10/14 1727 01/11/14 0610 01/11/14 1105  TROPONINI 1.58* 1.09* 1.06*    Treatments: See Above   Discharge Exam: Blood pressure 115/92, pulse 117, temperature 98.2 F (36.8 C), temperature source Oral, resp. rate 20, height 5\' 4"  (1.626 m), weight 119 lb 14.9 oz (54.4 kg), last menstrual period 04/23/2011, SpO2 99.00%.   Disposition: 01-Home or Self Care      Discharge Instructions   Diet - low sodium heart healthy    Complete by:   As directed      Discharge instructions    Complete by:  As directed   No lifting with right arm for three days.     Increase activity slowly    Complete by:  As directed             Medication List         isosorbide mononitrate 30 MG 24 hr tablet  Commonly known as:  IMDUR  Take 1 tablet (30 mg total) by mouth daily.     metoprolol tartrate 25 MG tablet  Commonly known as:  LOPRESSOR  Take 1 tablet (25 mg total) by mouth 2 (two) times daily.     nitroGLYCERIN 0.4 MG SL tablet  Commonly known as:  NITROSTAT  Place 1 tablet (0.4 mg total) under the tongue every 5 (five) minutes x 3 doses as needed for chest pain.     omeprazole 20 MG capsule  Commonly known as:  PRILOSEC  Take 20 mg by mouth daily.       Follow-up Information   Follow up with Jacolyn Reedy On 02/05/2014. (11:20AM)    Contact information:   [address] [place] [address] [phone]     Greater than 30 minutes was spent completing the patient's discharge.   Signed: Wilburt Finlay,  PAC  01/12/2014, 8:12 AM

## 2014-01-16 ENCOUNTER — Telehealth: Payer: Self-pay | Admitting: *Deleted

## 2014-01-16 ENCOUNTER — Emergency Department (HOSPITAL_COMMUNITY): Payer: Medicaid Other

## 2014-01-16 ENCOUNTER — Encounter (HOSPITAL_COMMUNITY): Payer: Self-pay | Admitting: Emergency Medicine

## 2014-01-16 ENCOUNTER — Other Ambulatory Visit: Payer: Self-pay

## 2014-01-16 ENCOUNTER — Inpatient Hospital Stay (HOSPITAL_COMMUNITY)
Admission: EM | Admit: 2014-01-16 | Discharge: 2014-02-01 | DRG: 094 | Disposition: A | Discharge status: Rehabilitation Facility | Payer: Medicaid Other | Attending: Internal Medicine | Admitting: Internal Medicine

## 2014-01-16 DIAGNOSIS — Z862 Personal history of diseases of the blood and blood-forming organs and certain disorders involving the immune mechanism: Secondary | ICD-10-CM

## 2014-01-16 DIAGNOSIS — E039 Hypothyroidism, unspecified: Secondary | ICD-10-CM

## 2014-01-16 DIAGNOSIS — T83511A Infection and inflammatory reaction due to indwelling urethral catheter, initial encounter: Secondary | ICD-10-CM

## 2014-01-16 DIAGNOSIS — S52502G Unspecified fracture of the lower end of left radius, subsequent encounter for closed fracture with delayed healing: Secondary | ICD-10-CM

## 2014-01-16 DIAGNOSIS — I10 Essential (primary) hypertension: Secondary | ICD-10-CM

## 2014-01-16 DIAGNOSIS — G9340 Encephalopathy, unspecified: Secondary | ICD-10-CM

## 2014-01-16 DIAGNOSIS — E875 Hyperkalemia: Secondary | ICD-10-CM

## 2014-01-16 DIAGNOSIS — R7989 Other specified abnormal findings of blood chemistry: Secondary | ICD-10-CM

## 2014-01-16 DIAGNOSIS — R109 Unspecified abdominal pain: Secondary | ICD-10-CM

## 2014-01-16 DIAGNOSIS — E034 Atrophy of thyroid (acquired): Secondary | ICD-10-CM

## 2014-01-16 DIAGNOSIS — F411 Generalized anxiety disorder: Secondary | ICD-10-CM

## 2014-01-16 DIAGNOSIS — R000 Tachycardia, unspecified: Secondary | ICD-10-CM

## 2014-01-16 DIAGNOSIS — F1210 Cannabis abuse, uncomplicated: Secondary | ICD-10-CM | POA: Diagnosis present

## 2014-01-16 DIAGNOSIS — F17200 Nicotine dependence, unspecified, uncomplicated: Secondary | ICD-10-CM

## 2014-01-16 DIAGNOSIS — B961 Klebsiella pneumoniae [K. pneumoniae] as the cause of diseases classified elsewhere: Secondary | ICD-10-CM | POA: Diagnosis present

## 2014-01-16 DIAGNOSIS — R778 Other specified abnormalities of plasma proteins: Secondary | ICD-10-CM

## 2014-01-16 DIAGNOSIS — E785 Hyperlipidemia, unspecified: Secondary | ICD-10-CM

## 2014-01-16 DIAGNOSIS — R7402 Elevation of levels of lactic acid dehydrogenase (LDH): Secondary | ICD-10-CM

## 2014-01-16 DIAGNOSIS — I5181 Takotsubo syndrome: Secondary | ICD-10-CM

## 2014-01-16 DIAGNOSIS — G9349 Other encephalopathy: Secondary | ICD-10-CM | POA: Diagnosis present

## 2014-01-16 DIAGNOSIS — E872 Acidosis, unspecified: Secondary | ICD-10-CM

## 2014-01-16 DIAGNOSIS — R7401 Elevation of levels of liver transaminase levels: Secondary | ICD-10-CM

## 2014-01-16 DIAGNOSIS — M7710 Lateral epicondylitis, unspecified elbow: Secondary | ICD-10-CM

## 2014-01-16 DIAGNOSIS — N390 Urinary tract infection, site not specified: Secondary | ICD-10-CM

## 2014-01-16 DIAGNOSIS — R339 Retention of urine, unspecified: Secondary | ICD-10-CM | POA: Diagnosis present

## 2014-01-16 DIAGNOSIS — M25372 Other instability, left ankle: Secondary | ICD-10-CM

## 2014-01-16 DIAGNOSIS — R1084 Generalized abdominal pain: Secondary | ICD-10-CM

## 2014-01-16 DIAGNOSIS — R29898 Other symptoms and signs involving the musculoskeletal system: Secondary | ICD-10-CM

## 2014-01-16 DIAGNOSIS — F329 Major depressive disorder, single episode, unspecified: Secondary | ICD-10-CM

## 2014-01-16 DIAGNOSIS — R740 Nonspecific elevation of levels of transaminase and lactic acid dehydrogenase [LDH]: Secondary | ICD-10-CM

## 2014-01-16 DIAGNOSIS — F32A Depression, unspecified: Secondary | ICD-10-CM

## 2014-01-16 DIAGNOSIS — K219 Gastro-esophageal reflux disease without esophagitis: Secondary | ICD-10-CM | POA: Diagnosis present

## 2014-01-16 DIAGNOSIS — M25532 Pain in left wrist: Secondary | ICD-10-CM

## 2014-01-16 DIAGNOSIS — J449 Chronic obstructive pulmonary disease, unspecified: Secondary | ICD-10-CM | POA: Diagnosis present

## 2014-01-16 DIAGNOSIS — Z833 Family history of diabetes mellitus: Secondary | ICD-10-CM

## 2014-01-16 DIAGNOSIS — R600 Localized edema: Secondary | ICD-10-CM

## 2014-01-16 DIAGNOSIS — M542 Cervicalgia: Secondary | ICD-10-CM

## 2014-01-16 DIAGNOSIS — E038 Other specified hypothyroidism: Secondary | ICD-10-CM | POA: Diagnosis present

## 2014-01-16 DIAGNOSIS — E876 Hypokalemia: Secondary | ICD-10-CM

## 2014-01-16 DIAGNOSIS — F1910 Other psychoactive substance abuse, uncomplicated: Secondary | ICD-10-CM

## 2014-01-16 DIAGNOSIS — G8250 Quadriplegia, unspecified: Secondary | ICD-10-CM

## 2014-01-16 DIAGNOSIS — G5602 Carpal tunnel syndrome, left upper limb: Secondary | ICD-10-CM

## 2014-01-16 DIAGNOSIS — I214 Non-ST elevation (NSTEMI) myocardial infarction: Secondary | ICD-10-CM

## 2014-01-16 DIAGNOSIS — R079 Chest pain, unspecified: Secondary | ICD-10-CM | POA: Diagnosis present

## 2014-01-16 DIAGNOSIS — Z79899 Other long term (current) drug therapy: Secondary | ICD-10-CM

## 2014-01-16 DIAGNOSIS — J4489 Other specified chronic obstructive pulmonary disease: Secondary | ICD-10-CM | POA: Diagnosis present

## 2014-01-16 DIAGNOSIS — I498 Other specified cardiac arrhythmias: Secondary | ICD-10-CM | POA: Diagnosis present

## 2014-01-16 DIAGNOSIS — Z720 Tobacco use: Secondary | ICD-10-CM

## 2014-01-16 DIAGNOSIS — S62102G Fracture of unspecified carpal bone, left wrist, subsequent encounter for fracture with delayed healing: Secondary | ICD-10-CM

## 2014-01-16 DIAGNOSIS — Z9851 Tubal ligation status: Secondary | ICD-10-CM

## 2014-01-16 DIAGNOSIS — Z8639 Personal history of other endocrine, nutritional and metabolic disease: Secondary | ICD-10-CM

## 2014-01-16 DIAGNOSIS — F319 Bipolar disorder, unspecified: Secondary | ICD-10-CM

## 2014-01-16 DIAGNOSIS — R0789 Other chest pain: Secondary | ICD-10-CM | POA: Diagnosis present

## 2014-01-16 DIAGNOSIS — G610 Guillain-Barre syndrome: Principal | ICD-10-CM

## 2014-01-16 DIAGNOSIS — R32 Unspecified urinary incontinence: Secondary | ICD-10-CM | POA: Diagnosis present

## 2014-01-16 DIAGNOSIS — F419 Anxiety disorder, unspecified: Secondary | ICD-10-CM

## 2014-01-16 DIAGNOSIS — R1011 Right upper quadrant pain: Secondary | ICD-10-CM

## 2014-01-16 DIAGNOSIS — D649 Anemia, unspecified: Secondary | ICD-10-CM

## 2014-01-16 DIAGNOSIS — I2510 Atherosclerotic heart disease of native coronary artery without angina pectoris: Secondary | ICD-10-CM | POA: Diagnosis present

## 2014-01-16 DIAGNOSIS — J019 Acute sinusitis, unspecified: Secondary | ICD-10-CM

## 2014-01-16 DIAGNOSIS — M6281 Muscle weakness (generalized): Secondary | ICD-10-CM

## 2014-01-16 DIAGNOSIS — E871 Hypo-osmolality and hyponatremia: Secondary | ICD-10-CM

## 2014-01-16 DIAGNOSIS — R197 Diarrhea, unspecified: Secondary | ICD-10-CM

## 2014-01-16 HISTORY — DX: Other specified postprocedural states: Z98.890

## 2014-01-16 HISTORY — DX: Non-ST elevation (NSTEMI) myocardial infarction: I21.4

## 2014-01-16 LAB — RAPID URINE DRUG SCREEN, HOSP PERFORMED
Amphetamines: NOT DETECTED
Barbiturates: NOT DETECTED
Benzodiazepines: POSITIVE — AB
Cocaine: NOT DETECTED
Opiates: NOT DETECTED
Tetrahydrocannabinol: POSITIVE — AB

## 2014-01-16 LAB — BASIC METABOLIC PANEL
Anion gap: 20 — ABNORMAL HIGH (ref 5–15)
BUN: 8 mg/dL (ref 6–23)
CO2: 18 mEq/L — ABNORMAL LOW (ref 19–32)
Calcium: 8.9 mg/dL (ref 8.4–10.5)
Chloride: 97 mEq/L (ref 96–112)
Creatinine, Ser: 0.49 mg/dL — ABNORMAL LOW (ref 0.50–1.10)
GFR calc Af Amer: >90 mL/min (ref >90)
GFR calc non Af Amer: >90 mL/min (ref >90)
Glucose, Bld: 113 mg/dL — ABNORMAL HIGH (ref 70–99)
Potassium: 3.2 mEq/L — ABNORMAL LOW (ref 3.7–5.3)
Sodium: 135 mEq/L — ABNORMAL LOW (ref 137–147)

## 2014-01-16 LAB — CBC WITH DIFFERENTIAL/PLATELET
Basophils Absolute: 0 10*3/uL (ref 0.0–0.1)
Basophils Relative: 0 % (ref 0–1)
Eosinophils Absolute: 0.1 10*3/uL (ref 0.0–0.7)
Eosinophils Relative: 1 % (ref 0–5)
HCT: 35.2 % — ABNORMAL LOW (ref 36.0–46.0)
Hemoglobin: 12.9 g/dL (ref 12.0–15.0)
Lymphocytes Relative: 44 % (ref 12–46)
Lymphs Abs: 3 10*3/uL (ref 0.7–4.0)
MCH: 33.1 pg (ref 26.0–34.0)
MCHC: 36.6 g/dL — ABNORMAL HIGH (ref 30.0–36.0)
MCV: 90.3 fL (ref 78.0–100.0)
Monocytes Absolute: 0.5 10*3/uL (ref 0.1–1.0)
Monocytes Relative: 8 % (ref 3–12)
Neutro Abs: 3.3 10*3/uL (ref 1.7–7.7)
Neutrophils Relative %: 48 % (ref 43–77)
Platelets: 315 10*3/uL (ref 150–400)
RBC: 3.9 MIL/uL (ref 3.87–5.11)
RDW: 14.5 % (ref 11.5–15.5)
WBC: 6.9 10*3/uL (ref 4.0–10.5)

## 2014-01-16 LAB — TROPONIN I
Troponin I: 1.14 ng/mL (ref <0.30)
Troponin I: 0.75 ng/mL (ref <0.30)

## 2014-01-16 LAB — MAGNESIUM: Magnesium: 1.5 mg/dL (ref 1.5–2.5)

## 2014-01-16 LAB — LACTIC ACID, PLASMA: Lactic Acid, Venous: 2.7 mmol/L — ABNORMAL HIGH (ref 0.5–2.2)

## 2014-01-16 MED ORDER — NITROGLYCERIN 0.4 MG SL SUBL
0.4000 mg | SUBLINGUAL_TABLET | SUBLINGUAL | Status: DC | PRN
  Filled 2014-01-16 (×2): qty 1

## 2014-01-16 MED ORDER — PANTOPRAZOLE SODIUM 40 MG PO TBEC: 40.0000 mg | DELAYED_RELEASE_TABLET | Freq: Every day | ORAL | Status: DC

## 2014-01-16 MED ORDER — IOHEXOL 350 MG/ML SOLN
100.0000 mL | Freq: Once | INTRAVENOUS | Status: AC | PRN
  Administered 2014-01-16: 100 mL via INTRAVENOUS

## 2014-01-16 MED ORDER — HEPARIN SODIUM (PORCINE) 5000 UNIT/ML IJ SOLN
5000.0000 [IU] | Freq: Three times a day (TID) | INTRAMUSCULAR | Status: DC
  Administered 2014-01-16 – 2014-01-19 (×9): 5000 [IU] via SUBCUTANEOUS
  Filled 2014-01-16 (×9): qty 1

## 2014-01-16 MED ORDER — NITROGLYCERIN 0.4 MG SL SUBL: 0.4000 mg | SUBLINGUAL_TABLET | Freq: Once | SUBLINGUAL | Status: DC

## 2014-01-16 MED ORDER — ISOSORBIDE MONONITRATE ER 30 MG PO TB24
30.0000 mg | ORAL_TABLET | Freq: Every day | ORAL | Status: DC
  Administered 2014-01-16: 30 mg via ORAL
  Filled 2014-01-16 (×4): qty 1

## 2014-01-16 MED ORDER — ASPIRIN 81 MG PO CHEW
324.0000 mg | CHEWABLE_TABLET | Freq: Once | ORAL | Status: AC
  Administered 2014-01-16: 324 mg via ORAL
  Filled 2014-01-16: qty 4

## 2014-01-16 MED ORDER — POTASSIUM CHLORIDE CRYS ER 20 MEQ PO TBCR
40.0000 meq | EXTENDED_RELEASE_TABLET | Freq: Once | ORAL | Status: AC
  Administered 2014-01-16: 40 meq via ORAL
  Filled 2014-01-16: qty 2

## 2014-01-16 MED ORDER — LORAZEPAM 2 MG/ML IJ SOLN
1.0000 mg | Freq: Once | INTRAMUSCULAR | Status: AC
  Administered 2014-01-16: 1 mg via INTRAVENOUS
  Filled 2014-01-16: qty 1

## 2014-01-16 MED ORDER — PANTOPRAZOLE SODIUM 40 MG PO TBEC
40.0000 mg | DELAYED_RELEASE_TABLET | Freq: Every day | ORAL | Status: DC
  Administered 2014-01-17 – 2014-02-01 (×15): 40 mg via ORAL
  Filled 2014-01-16 (×17): qty 1

## 2014-01-16 MED ORDER — SODIUM CHLORIDE 0.9 % IV SOLN
INTRAVENOUS | Status: DC
  Administered 2014-01-16 – 2014-01-17 (×2): via INTRAVENOUS

## 2014-01-16 MED ORDER — ONDANSETRON HCL 4 MG/2ML IJ SOLN
4.0000 mg | Freq: Four times a day (QID) | INTRAMUSCULAR | Status: DC | PRN
  Administered 2014-01-19 (×2): 4 mg via INTRAVENOUS
  Filled 2014-01-16 (×2): qty 2

## 2014-01-16 MED ORDER — ACETAMINOPHEN 500 MG PO TABS
500.0000 mg | ORAL_TABLET | Freq: Four times a day (QID) | ORAL | Status: DC | PRN
  Administered 2014-01-17 – 2014-01-29 (×13): 500 mg via ORAL
  Filled 2014-01-16 (×15): qty 1

## 2014-01-16 MED ORDER — NITROGLYCERIN IN D5W 200-5 MCG/ML-% IV SOLN
2.0000 ug/min | INTRAVENOUS | Status: DC
  Filled 2014-01-16: qty 250

## 2014-01-16 MED ORDER — ISOSORBIDE MONONITRATE ER 30 MG PO TB24
30.0000 mg | ORAL_TABLET | Freq: Every day | ORAL | Status: DC
  Administered 2014-01-17 – 2014-01-21 (×5): 30 mg via ORAL
  Filled 2014-01-16 (×10): qty 1

## 2014-01-16 MED ORDER — SODIUM CHLORIDE 0.9 % IV BOLUS (SEPSIS)
1000.0000 mL | Freq: Once | INTRAVENOUS | Status: AC
  Administered 2014-01-16: 1000 mL via INTRAVENOUS

## 2014-01-16 MED ORDER — METOPROLOL TARTRATE 25 MG PO TABS
25.0000 mg | ORAL_TABLET | Freq: Two times a day (BID) | ORAL | Status: DC
  Administered 2014-01-16: 25 mg via ORAL
  Filled 2014-01-16: qty 1

## 2014-01-16 MED ORDER — METOPROLOL TARTRATE 1 MG/ML IV SOLN
2.5000 mg | Freq: Once | INTRAVENOUS | Status: AC
  Administered 2014-01-16: 2.5 mg via INTRAVENOUS
  Filled 2014-01-16: qty 5

## 2014-01-16 MED ORDER — ONDANSETRON HCL 4 MG PO TABS
4.0000 mg | ORAL_TABLET | Freq: Four times a day (QID) | ORAL | Status: DC | PRN
  Administered 2014-01-27: 4 mg via ORAL
  Filled 2014-01-16 (×2): qty 1

## 2014-01-16 NOTE — Telephone Encounter (Signed)
Pt states that she is till having CP and has to take nitro for past two night. She has not been seen here yet and is set up to see Herma CarsonMichelle Lenze in aug/tmj

## 2014-01-16 NOTE — ED Provider Notes (Signed)
CSN: [id number]     Arrival date & time 01/16/14  1555 History   First MD Initiated Contact with Patient 01/16/14 1637     Chief Complaint  Patient presents with  . Chest Pain     (Consider location/radiation/quality/duration/timing/severity/associated sxs/prior Treatment) HPI  43yF with chief complaint of "I'm stressed." I saw pt in ED about a week ago for essentially the same symptoms. Had elevated troponin and transferred to [facility] as NSTEMI. Had cath with clean, but tortuous coronaries. Felt symptoms may be from vasospasm. Pt states he symptoms have remained the same since I last saw her. Remains very poor historian. Any question that involves more than a yes/no response she will say "Sorry, I just can't think right now." Keeps trying to defer questioning to her father at bedside as she did the last time I saw her. Again admits to marijuana usage, but no cocaine. UDS negative for cocaine previously. She has very poor insight. Discharged with metoprolol, imdur and PRN nitro. She tells me she hasn't been taking any medications since she left the hospital. Her father says he has been giving them to her. No fever or chills. No cough. No unusual leg pain or swelling. Has hx of COPD. Continues to smoke. Says extremely stressed with relationship with her husband whom she left the apparently not allowed to see her children. Living with parents currently. [place] No local PCP or psychiatry care established. Parents having difficulty with taking care of her.   Past Medical History  Diagnosis Date  . Anxiety   . Depression   . Asthma   . 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 COPD (chronic obstructive pulmonary disease)   . GERD (gastroesophageal reflux disease)   . Bipolar disorder   . Blood transfusion 2006  . Coronary artery disease    Past Surgical History  Procedure Laterality Date  . Tennis elbow release  04/27/06    left; [facility], [facility]  .  Multiple tooth extractions  10/07    full upper  . Orif proximal humerus fracture  08/06    left,  . Dilation and curettage of uterus  June 2012    failed ablation  . Tubal ligation  June 2012    [facility]  . Bipolar disorder    . Hysteroscopy  01/27/2011    Procedure: HYSTEROSCOPY;  Surgeon: Tilda Burrow, MD;  Location: [facility];  Service: Gynecology;  Laterality: N/A;  . Vaginal hysterectomy  05/19/2011    Procedure: HYSTERECTOMY VAGINAL;  Surgeon: Tilda Burrow, MD;  Location: [facility];  Service: Gynecology;  Laterality: N/A;  . Orif wrist fracture  06/17/2012    Procedure: OPEN REDUCTION INTERNAL FIXATION (ORIF) WRIST FRACTURE;  Surgeon: Vickki Hearing, MD;  Location: [facility];  Service: Orthopedics;  Laterality: Left;  . Carpal tunnel release  06/17/2012    Procedure: CARPAL TUNNEL RELEASE;  Surgeon: Vickki Hearing, MD;  Location: [facility];  Service: Orthopedics;  Laterality: Left;   Family History  Problem Relation Age of Onset  . Anesthesia problems Neg Hx   . Hypotension Neg Hx   . Malignant hyperthermia Neg Hx   . Pseudochol deficiency Neg Hx   . Diabetes     History  Substance Use Topics  . Smoking status: Current Every Day Smoker -- 1.00 packs/day for 22 years    Types: Cigarettes  . Smokeless tobacco: Not on file  . Alcohol Use: No  OB History   Grav Para Term Preterm Abortions TAB SAB Ect Mult Living                 Review of Systems  All systems reviewed and negative, other than as noted in HPI.   Allergies  Review of patient's allergies indicates no known allergies.  Home Medications   Prior to Admission medications   Medication Sig Start Date End Date Taking? Authorizing Provider  isosorbide mononitrate (IMDUR) 30 MG 24 hr tablet Take 1 tablet (30 mg total) by mouth daily. 01/12/14  Yes Wilburt FinlayBryan Hager, PA-C  metoprolol tartrate (LOPRESSOR) 25 MG tablet Take 1 tablet (25 mg total) by mouth 2 (two) times daily. 01/12/14  Yes Wilburt FinlayBryan Hager, PA-C   nitroGLYCERIN (NITROSTAT) 0.4 MG SL tablet Place 1 tablet (0.4 mg total) under the tongue every 5 (five) minutes x 3 doses as needed for chest pain. 01/12/14  Yes Wilburt FinlayBryan Hager, PA-C  omeprazole (PRILOSEC) 20 MG capsule Take 20 mg by mouth daily.     Yes Historical Provider, MD   BP 123/92  Temp(Src) 97.7 F (36.5 C) (Oral)  Resp 24  Ht 5\' 4"  (1.626 m)  SpO2 100%  LMP 04/23/2011 Physical Exam  Nursing note and vitals reviewed. Constitutional: She appears well-developed and well-nourished. No distress.  HENT:  Head: Normocephalic and atraumatic.  Eyes: Conjunctivae are normal. Right eye exhibits no discharge. Left eye exhibits no discharge.  Neck: Neck supple.  Cardiovascular: Regular rhythm and normal heart sounds.  Exam reveals no gallop and no friction rub.   No murmur heard. Tachycardic  Pulmonary/Chest: Effort normal and breath sounds normal. No respiratory distress.  Abdominal: Soft. She exhibits no distension. There is no tenderness.  Musculoskeletal: She exhibits no edema and no tenderness.  Lower extremities symmetric as compared to each other. No calf tenderness. Negative Homan's. No palpable cords.   Neurological: She is alert.  Skin: Skin is warm and dry.  Psychiatric: Her behavior is normal. Thought content normal.  Anxious    ED Course  Procedures (including critical care time) Labs Review Labs Reviewed  CBC WITH DIFFERENTIAL - Abnormal; Notable for the following:    HCT 35.2 (*)    MCHC 36.6 (*)    All other components within normal limits  BASIC METABOLIC PANEL - Abnormal; Notable for the following:    Sodium 135 (*)    Potassium 3.2 (*)    CO2 18 (*)    Glucose, Bld 113 (*)    Creatinine, Ser 0.49 (*)    Anion gap 20 (*)    All other components within normal limits  TROPONIN I - Abnormal; Notable for the following:    Troponin I 1.14 (*)    All other components within normal limits  LACTIC ACID, PLASMA - Abnormal; Notable for the following:    Lactic  Acid, Venous 2.7 (*)    All other components within normal limits  MAGNESIUM  URINE RAPID DRUG SCREEN (HOSP PERFORMED)    Imaging Review Dg Chest Portable 1 View  01/16/2014   CLINICAL DATA:  Chest pain.  EXAM: PORTABLE CHEST - 1 VIEW  COMPARISON:  01/10/2014.  FINDINGS: Mediastinum and hilar structures are normal. Lungs are clear. Heart size normal. No pleural effusion or pneumothorax. Old right rib fractures are present.  IMPRESSION: No active disease.   Electronically Signed   By: Maisie Fushomas  Register   On: 01/16/2014 16:51     EKG Interpretation None      EKG:  Rhythm:  sinus tachycardia Vent. rate 116 BPM PR interval 118 ms QRS duration 80 ms QT/QTc 347/482 ms ST segments: NS ST changes Comparison: no significant change from recent   MDM   Final diagnoses:  Chest pressure  Elevated troponin  Metabolic acidosis  Hypokalemia  Anxiety    43yF with "stress." Does endorse CP, but vague/poor historian. Persistently elevated troponin of >1. Just had heart catheterization which showed coronaries without significant disease but were extremely tortuous with distal tapering felt vulnerable to vasospasm. EKG appears similar to recent ones.  Pt remains tachycardic as she was on presentation previously and during hospitalization. TSH was normal. Hx of COPD but she sounds clear to me. O2 sats normal. Mild tachypnea, but no accessory muscle usage. Will CT to eval for possible PE. I'm not sure how much of her symptoms organic versus stress/psych. Hx of depression/anxiety/bipolar. Currently not on meds. No local psychiatry care. I think this needs to be addressed once has been medically cleared.      Raeford Razor, MD 01/17/14 [id number]

## 2014-01-16 NOTE — H&P (Signed)
[facility] History and Physical  KYLIEGH JESTER ZOX:[id number] DOB: 07/06/1970 DOA: 01/16/2014  Referring physician: ER. PCP: No PCP Per Patient   Chief Complaint: Chest pain.  HPI: Crystal Stein is a 43 y.o. female  This is a 43 year old lady who was recently in the hospital approximately 5 days ago when she presented with chest pain. Troponin levels were elevated. She was transferred to [facility] and underwent cardiac catheterization which showed clean coronary arteries. It was felt that she had a non-ST elevation MI. Ejection fraction was at low normal. She presents once again with chest pain which is central in nature, dull. It does not seem to radiate. There is no sweating but she did have nausea. She tells me that she has been separated from her husband for the last year and the husband is not allowing her to see her children with whom they are. She feels that this is affecting her significantly and possibly causing the chest pain. She has a history of anxiety/depression and possible history of bipolar disorder she smokes cigarettes which she apparently stopped in the last few days. She is now being admitted for further investigation.   Review of Systems:  Constitutional:  No weight loss, night sweats, Fevers, chills, fatigue.  HEENT:  No headaches, Difficulty swallowing,Tooth/dental problems,Sore throat,  No sneezing, itching, ear ache, nasal congestion, post nasal drip,    GI:  No heartburn, indigestion, abdominal pain, nausea, vomiting, diarrhea, change in bowel habits, loss of appetite  Resp:  No shortness of breath with exertion or at rest. No excess mucus, no productive cough, No non-productive cough, No coughing up of blood.No change in color of mucus.No wheezing.No chest wall deformity  Skin:  no rash or lesions.  GU:  no dysuria, change in color of urine, no urgency or frequency. No flank pain.  Musculoskeletal:  No joint pain or swelling. No  decreased range of motion. No back pain.  Psych:  No change in mood or affect. No depression or anxiety. No memory loss.   Past Medical History  Diagnosis Date  . Anxiety   . Depression   . Asthma   . 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 COPD (chronic obstructive pulmonary disease)   . GERD (gastroesophageal reflux disease)   . Bipolar disorder   . Blood transfusion 2006  . Coronary artery disease    Past Surgical History  Procedure Laterality Date  . Tennis elbow release  04/27/06    left; [facility], [facility]  . Multiple tooth extractions  10/07    full upper  . Orif proximal humerus fracture  08/06    left,  . Dilation and curettage of uterus  June 2012    failed ablation  . Tubal ligation  June 2012    [facility]  . Bipolar disorder    . Hysteroscopy  01/27/2011    Procedure: HYSTEROSCOPY;  Surgeon: Tilda Burrow, MD;  Location: [facility];  Service: Gynecology;  Laterality: N/A;  . Vaginal hysterectomy  05/19/2011    Procedure: HYSTERECTOMY VAGINAL;  Surgeon: Tilda Burrow, MD;  Location: [facility];  Service: Gynecology;  Laterality: N/A;  . Orif wrist fracture  06/17/2012    Procedure: OPEN REDUCTION INTERNAL FIXATION (ORIF) WRIST FRACTURE;  Surgeon: Vickki Hearing, MD;  Location: [facility];  Service: Orthopedics;  Laterality: Left;  . Carpal tunnel release  06/17/2012    Procedure: CARPAL TUNNEL RELEASE;  Surgeon: Vickki Hearing, MD;  Location:  [facility];  Service: Orthopedics;  Laterality: Left;   Social History:  reports that she has been smoking Cigarettes.  She has a 22 pack-year smoking history. She does not have any smokeless tobacco history on file. She reports that she does not drink alcohol or use illicit drugs.  No Known Allergies  Family History  Problem Relation Age of Onset  . Anesthesia problems Neg Hx   . Hypotension Neg Hx   . Malignant hyperthermia Neg Hx   . Pseudochol deficiency Neg Hx   . Diabetes       Prior to Admission  medications   Medication Sig Start Date End Date Taking? Authorizing Provider  acetaminophen (TYLENOL) 500 MG tablet Take 500 mg by mouth every 6 (six) hours as needed for mild pain or moderate pain.   Yes Historical Provider, MD  isosorbide mononitrate (IMDUR) 30 MG 24 hr tablet Take 1 tablet (30 mg total) by mouth daily. 01/12/14  Yes Wilburt Finlay, PA-C  metoprolol tartrate (LOPRESSOR) 25 MG tablet Take 1 tablet (25 mg total) by mouth 2 (two) times daily. 01/12/14  Yes Wilburt Finlay, PA-C  nitroGLYCERIN (NITROSTAT) 0.4 MG SL tablet Place 1 tablet (0.4 mg total) under the tongue every 5 (five) minutes x 3 doses as needed for chest pain. 01/12/14  Yes Wilburt Finlay, PA-C  omeprazole (PRILOSEC) 20 MG capsule Take 20 mg by mouth daily.     Yes Historical Provider, MD   Physical Exam: Filed Vitals:   01/16/14 2030 01/16/14 2100 01/16/14 2130 01/16/14 2132  BP: 114/97 114/93 125/97   Pulse: 114 117    Temp:    98.5 F (36.9 C)  TempSrc:      Resp: 19 19 22    Height:      SpO2: 100% 100%      Wt Readings from Last 3 Encounters:  01/12/14 54.4 kg (119 lb 14.9 oz)  01/12/14 54.4 kg (119 lb 14.9 oz)  11/24/12 61.689 kg (136 lb)    General:  Appears calm and comfortable. Does not appear to be in pain at rest. Eyes: PERRL, normal lids, irises & conjunctiva ENT: grossly normal hearing, lips & tongue Neck: no LAD, masses or thyromegaly Cardiovascular: RRR, no m/r/g. No LE edema. Telemetry: SR, no arrhythmias  Respiratory: CTA bilaterally, no w/r/r. Normal respiratory effort. Abdomen: soft, ntnd Skin: no rash or induration seen on limited exam Musculoskeletal: She is tender in the anterior chest wall, reproducing her pain. Psychiatric: Anxious. Neurologic: grossly non-focal.          Labs on Admission:  Basic Metabolic Panel:  Recent Labs Lab 01/10/14 1727 01/11/14 0500 01/16/14 1615  NA 136* 137 135*  K 2.9* 4.1 3.2*  CL 95* 101 97  CO2 27 21 18*  GLUCOSE 115* 93 113*  BUN 8 7 8     CREATININE 0.55 0.49* 0.49*  CALCIUM 8.8 8.2* 8.9  MG 1.9  --  1.5   Liver Function Tests: No results found for this basename: AST, ALT, ALKPHOS, BILITOT, PROT, ALBUMIN,  in the last 168 hours No results found for this basename: LIPASE, AMYLASE,  in the last 168 hours No results found for this basename: AMMONIA,  in the last 168 hours CBC:  Recent Labs Lab 01/10/14 1727 01/11/14 0644 01/12/14 0300 01/16/14 1615  WBC 6.9 5.8 4.7 6.9  NEUTROABS 3.3  --   --  3.3  HGB 13.8 12.9 10.2* 12.9  HCT 38.7 36.2 29.2* 35.2*  MCV 91.3 91.4 94.8 90.3  PLT  271 264 232 315   Cardiac Enzymes:  Recent Labs Lab 01/10/14 1727 01/11/14 0610 01/11/14 1105 01/16/14 1615  TROPONINI 1.58* 1.09* 1.06* 1.14*    BNP (last 3 results)  Recent Labs  01/11/14 0610  PROBNP 259.3*   CBG: No results found for this basename: GLUCAP,  in the last 168 hours  Radiological Exams on Admission: Ct Angio Chest Pe W/cm &/or Wo Cm  01/16/2014   CLINICAL DATA:  Chest pain and shortness of breath.  Tachycardia.  EXAM: CT ANGIOGRAPHY CHEST WITH CONTRAST  TECHNIQUE: Multidetector CT imaging of the chest was performed using the standard protocol during bolus administration of intravenous contrast. Multiplanar CT image reconstructions and MIPs were obtained to evaluate the vascular anatomy.  CONTRAST:  100mL OMNIPAQUE IOHEXOL 350 MG/ML SOLN  COMPARISON:  Chest x-ray dated 01/16/2014 and CT scans dated 08/23/2011 and 08/17/2008  FINDINGS: There are no pulmonary emboli or infiltrates or effusions. There is minimal stable scarring in the medial aspect of the right middle lobe. There is a small calcified lymph node at the level of the carina to the right.  Heart size is normal. RV/LV ratio is normal. Ascending thoracic aorta is slightly prominent at a diameter of 3.2 cm, stable.  The patient has developed hepatomegaly with diffuse hepatic steatosis since the prior exam.  No osseous abnormality.  Review of the MIP images  confirms the above findings.  IMPRESSION: 1. No pulmonary emboli or other acute abnormalities of the chest. 2. New hepatomegaly and hepatic steatosis.   Electronically Signed   By: Geanie CooleyJim  Maxwell M.D.   On: 01/16/2014 19:06   Dg Chest Portable 1 View  01/16/2014   CLINICAL DATA:  Chest pain.  EXAM: PORTABLE CHEST - 1 VIEW  COMPARISON:  01/10/2014.  FINDINGS: Mediastinum and hilar structures are normal. Lungs are clear. Heart size normal. No pleural effusion or pneumothorax. Old right rib fractures are present.  IMPRESSION: No active disease.   Electronically Signed   By: Maisie Fushomas  Register   On: 01/16/2014 16:51    EKG: Independently reviewed. Normal sinus rhythm with a sinus tachycardia. No ST elevation. There appear to be repolarization abnormalities.  Assessment/Plan   1. Chest pain, unclear etiology. Anterior chest wall is tender and reproduces the pain and therefore I am inclined to think this is musculoskeletal. However, her troponin level is elevated. 2. Anxiety/depression. 3. Tobacco abuse. 4. Marijuana in urine drug screen.  Plan: 1. Admit to telemetry. 2. Serial cardiac enzymes. 3. Cardiology consultation.  Further recommendations will depend on patient's hospital progress.  Code Status: Full code.  DVT Prophylaxis: Heparin.  Family Communication: I discussed the plan with the patient at the bedside.   Disposition Plan: Home when medically stable.  Time spent: 60 minutes.  Wilson SingerGOSRANI,Izel Eisenhardt C [facility] Pager [phone].  **Disclaimer: This note may have been dictated with voice recognition software. Similar sounding words can inadvertently be transcribed and this note may contain transcription errors which may not have been corrected upon publication of note.**

## 2014-01-16 NOTE — Telephone Encounter (Signed)
Continuation, advised pt to go to the ED

## 2014-01-16 NOTE — ED Notes (Signed)
Pt alert and oriented.  Reports feeling "some better than I did when I came in."  No nausea at present time.

## 2014-01-16 NOTE — Telephone Encounter (Signed)
1500 hrs: I spoke with patient and her mother Mrs.Gearld as patient appeared to be confused.Mother states pt has active CP with NTG relief (took 1), has SOB and feels like an "elephant is sitting on her chest".States she cried out all last night that she was SOB and thought she was dying.Took 2 NTG then

## 2014-01-16 NOTE — ED Notes (Signed)
CRITICAL VALUE ALERT  Critical value received:  Troponin: 1.14   Date of notification:  01/16/14  Time of notification:  1758  [facility] read back[facility]   Dr. Juleen ChinaKohut will be notified   Nurse who received alert:  Christin Bachebecca Graviela Nodal RN    Responding MD:  Juleen ChinaKohut   Time MD responded:  1800

## 2014-01-16 NOTE — ED Notes (Signed)
Pt complain of chest pain  in the center of her chest. States she had a cath a month ago and her chest has been hurting since. Pt also is depressed and states there are some things she can't remember

## 2014-01-17 ENCOUNTER — Encounter (HOSPITAL_COMMUNITY): Payer: Self-pay | Admitting: Cardiology

## 2014-01-17 DIAGNOSIS — I214 Non-ST elevation (NSTEMI) myocardial infarction: Secondary | ICD-10-CM

## 2014-01-17 DIAGNOSIS — R740 Nonspecific elevation of levels of transaminase and lactic acid dehydrogenase [LDH]: Secondary | ICD-10-CM

## 2014-01-17 DIAGNOSIS — G9340 Encephalopathy, unspecified: Secondary | ICD-10-CM | POA: Diagnosis present

## 2014-01-17 DIAGNOSIS — F17200 Nicotine dependence, unspecified, uncomplicated: Secondary | ICD-10-CM

## 2014-01-17 DIAGNOSIS — E872 Acidosis, unspecified: Secondary | ICD-10-CM | POA: Diagnosis present

## 2014-01-17 DIAGNOSIS — R000 Tachycardia, unspecified: Secondary | ICD-10-CM | POA: Diagnosis present

## 2014-01-17 DIAGNOSIS — R7989 Other specified abnormal findings of blood chemistry: Secondary | ICD-10-CM

## 2014-01-17 DIAGNOSIS — I5181 Takotsubo syndrome: Secondary | ICD-10-CM | POA: Diagnosis present

## 2014-01-17 DIAGNOSIS — R7401 Elevation of levels of liver transaminase levels: Secondary | ICD-10-CM | POA: Diagnosis present

## 2014-01-17 DIAGNOSIS — F1910 Other psychoactive substance abuse, uncomplicated: Secondary | ICD-10-CM | POA: Diagnosis present

## 2014-01-17 LAB — COMPREHENSIVE METABOLIC PANEL
ALBUMIN: 2.1 g/dL — AB (ref 3.5–5.2)
ALT: 112 U/L — ABNORMAL HIGH (ref 0–35)
ANION GAP: 17 — AB (ref 5–15)
AST: 359 U/L — ABNORMAL HIGH (ref 0–37)
Alkaline Phosphatase: 257 U/L — ABNORMAL HIGH (ref 39–117)
BUN: 6 mg/dL (ref 6–23)
CALCIUM: 8.3 mg/dL — AB (ref 8.4–10.5)
CO2: 18 mEq/L — ABNORMAL LOW (ref 19–32)
CREATININE: 0.58 mg/dL (ref 0.50–1.10)
Chloride: 102 mEq/L (ref 96–112)
GFR calc non Af Amer: >90 mL/min (ref >90)
GLUCOSE: 90 mg/dL (ref 70–99)
Potassium: 3.9 mEq/L (ref 3.7–5.3)
Sodium: 137 mEq/L (ref 137–147)
TOTAL PROTEIN: 5.6 g/dL — AB (ref 6.0–8.3)
Total Bilirubin: 0.9 mg/dL (ref 0.3–1.2)

## 2014-01-17 LAB — TROPONIN I
Troponin I: 0.66 ng/mL (ref <0.30)
Troponin I: 0.74 ng/mL (ref <0.30)

## 2014-01-17 LAB — CBC
HEMATOCRIT: 30.8 % — AB (ref 36.0–46.0)
HEMOGLOBIN: 11 g/dL — AB (ref 12.0–15.0)
MCH: 32.5 pg (ref 26.0–34.0)
MCHC: 35.7 g/dL (ref 30.0–36.0)
MCV: 91.1 fL (ref 78.0–100.0)
Platelets: 288 10*3/uL (ref 150–400)
RBC: 3.38 MIL/uL — ABNORMAL LOW (ref 3.87–5.11)
RDW: 14.7 % (ref 11.5–15.5)
WBC: 7 10*3/uL (ref 4.0–10.5)

## 2014-01-17 LAB — TSH: TSH: 4.84 u[IU]/mL — ABNORMAL HIGH (ref 0.350–4.500)

## 2014-01-17 MED ORDER — DILTIAZEM HCL 30 MG PO TABS: 30.0000 mg | ORAL_TABLET | Freq: Four times a day (QID) | ORAL | Status: DC

## 2014-01-17 MED ORDER — SODIUM CHLORIDE 0.9 % IV SOLN
INTRAVENOUS | Status: DC
  Administered 2014-01-17 – 2014-01-26 (×13): via INTRAVENOUS

## 2014-01-17 MED ORDER — METOPROLOL SUCCINATE ER 50 MG PO TB24
50.0000 mg | ORAL_TABLET | Freq: Every day | ORAL | Status: DC
  Administered 2014-01-17 – 2014-01-21 (×5): 50 mg via ORAL
  Filled 2014-01-17 (×7): qty 1

## 2014-01-17 NOTE — Progress Notes (Signed)
CRITICAL VALUE ALERT  Critical value received: Triponin 0.77  Date of notification:  01/17/14  Time of notification:  1018  Critical value read back:yes  Nurse who received alert:  Koleen Nimrod Jahlia Omura RN  MD notified (1st page):  Dr Kerry HoughMemon  Time of first page:  1019  MD notified (2nd page):  Time of second page:  Responding MD: Dr Kerry HoughMemon  Time MD responded:  1019

## 2014-01-17 NOTE — Consult Note (Signed)
Reason for Consult: Chest pain Referring Physician: PTH  [name] is an 43 y.o. female who recently presented to [facility] with a probable type II NSTEMI  01/10/14. She underwent cardiac catherization which showed angiographically normal coronary arteries with no obvious culprit lesion. Distally her vessels were tortuous and tapered, and Dr. Allison Quarry note suggested that these might be potential targets for spasm. She did not however have any definitive evidence of spasm either angiographically, or with ST elevation by ECG during symptoms. Echocardiogram had poor endocardial definition with apparent septal and inferior apical hypokinesis, but LVEF 50-55%. She was placed in, in addition of beta blocker and Imdur, just recently discharged on July 24.  She returns now with reported recurrent chest pain. Troponins are still trending down from the recent event. She currently cannot recall coming into the hospital yesterday and thought that she was at [facility] and has "lost a week". Drug screen positive for marijuana, she takes Valium at home, and was also given Ativan in the ER. She continues to smoke. Chest CTA negative for PE. Potassium 3.2. She is under extreme stress going through a divorce and custody issues. She also has history of anxiety/depression and possible bipolar disorder.   Past Medical History  Diagnosis Date  . Anxiety   . Depression   . Asthma   . 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 COPD (chronic obstructive pulmonary disease)   . GERD (gastroesophageal reflux disease)   . Bipolar disorder   . Blood transfusion 2006  . History of cardiac catheterization July 2015    Angiographically normal coronary arteries with distal tortuosity  . NSTEMI (non-ST elevated myocardial infarction)     Not secondary to obstructive CAD or obvious plaque rupture, possibly vasospasm    Past Surgical History  Procedure Laterality Date  . Tennis elbow  release  04/27/06    Left; [facility], [facility]  . Multiple tooth extractions  10/07    Full upper  . Orif proximal humerus fracture  08/06    Left,  . Dilation and curettage of uterus  June 2012    Failed ablation  . Tubal ligation  June 2012    [place]  . Bipolar disorder    . Hysteroscopy  01/27/2011    Procedure: HYSTEROSCOPY;  Surgeon: Jonnie Kind, MD;  Location: [facility];  Service: Gynecology;  Laterality: N/A;  . Vaginal hysterectomy  05/19/2011    Procedure: HYSTERECTOMY VAGINAL;  Surgeon: Jonnie Kind, MD;  Location: [facility];  Service: Gynecology;  Laterality: N/A;  . Orif wrist fracture  06/17/2012    Procedure: OPEN REDUCTION INTERNAL FIXATION (ORIF) WRIST FRACTURE;  Surgeon: Carole Civil, MD;  Location: [facility];  Service: Orthopedics;  Laterality: Left;  . Carpal tunnel release  06/17/2012    Procedure: CARPAL TUNNEL RELEASE;  Surgeon: Carole Civil, MD;  Location: [facility];  Service: Orthopedics;  Laterality: Left;    Family History  Problem Relation Age of Onset  . Anesthesia problems Neg Hx   . Hypotension Neg Hx   . Malignant hyperthermia Neg Hx   . Pseudochol deficiency Neg Hx   . Diabetes      Social History:  reports that she has been smoking Cigarettes.  She has a 22 pack-year smoking history. She does not have any smokeless tobacco history on file. She reports that she does not drink alcohol or use illicit drugs.  Allergies: No Known Allergies  Medications:  Scheduled Meds: . heparin  5,000 Units Subcutaneous 3 times per day  . isosorbide mononitrate  30 mg Oral Daily  . metoprolol succinate  50 mg Oral Daily  . pantoprazole  40 mg Oral Daily   Continuous Infusions: . sodium chloride 100 mL/hr at 01/16/14 2342    PRN Meds:.acetaminophen, nitroGLYCERIN, ondansetron (ZOFRAN) IV, ondansetron   Results for orders placed during the hospital encounter of 01/16/14 (from the past 48 hour(s))  CBC WITH DIFFERENTIAL     Status: Abnormal   Collection  Time    01/16/14  4:15 PM      Result Value Ref Range   WBC 6.9  4.0 - 10.5 K/uL   RBC 3.90  3.87 - 5.11 MIL/uL   Hemoglobin 12.9  12.0 - 15.0 g/dL   HCT 35.2 (*) 36.0 - 46.0 %   MCV 90.3  78.0 - 100.0 fL   MCH 33.1  26.0 - 34.0 pg   MCHC 36.6 (*) 30.0 - 36.0 g/dL   RDW 14.5  11.5 - 15.5 %   Platelets 315  150 - 400 K/uL   Neutrophils Relative % 48  43 - 77 %   Neutro Abs 3.3  1.7 - 7.7 K/uL   Lymphocytes Relative 44  12 - 46 %   Lymphs Abs 3.0  0.7 - 4.0 K/uL   Monocytes Relative 8  3 - 12 %   Monocytes Absolute 0.5  0.1 - 1.0 K/uL   Eosinophils Relative 1  0 - 5 %   Eosinophils Absolute 0.1  0.0 - 0.7 K/uL   Basophils Relative 0  0 - 1 %   Basophils Absolute 0.0  0.0 - 0.1 K/uL  BASIC METABOLIC PANEL     Status: Abnormal   Collection Time    01/16/14  4:15 PM      Result Value Ref Range   Sodium 135 (*) 137 - 147 mEq/L   Potassium 3.2 (*) 3.7 - 5.3 mEq/L   Chloride 97  96 - 112 mEq/L   CO2 18 (*) 19 - 32 mEq/L   Glucose, Bld 113 (*) 70 - 99 mg/dL   BUN 8  6 - 23 mg/dL   Creatinine, Ser 0.49 (*) 0.50 - 1.10 mg/dL   Calcium 8.9  8.4 - 10.5 mg/dL   GFR calc non Af Amer >90  >90 mL/min   GFR calc Af Amer >90  >90 mL/min   Comment: (NOTE)     The eGFR has been calculated using the CKD EPI equation.     This calculation has not been validated in all clinical situations.     eGFR's persistently <90 mL/min signify possible Chronic Kidney     Disease.   Anion gap 20 (*) 5 - 15  TROPONIN I     Status: Abnormal   Collection Time    01/16/14  4:15 PM      Result Value Ref Range   Troponin I 1.14 (*) <0.30 ng/mL   Comment:            Due to the release kinetics of cTnI,     a negative result within the first hours     of the onset of symptoms does not rule out     myocardial infarction with certainty.     If myocardial infarction is still suspected,     repeat the test at appropriate intervals.     CRITICAL RESULT CALLED TO, READ BACK BY AND VERIFIED WITH:  Crystal Stein  ON 01/16/14 AT 1755 BY Crystal Stein  MAGNESIUM     Status: None   Collection Time    01/16/14  4:15 PM      Result Value Ref Range   Magnesium 1.5  1.5 - 2.5 mg/dL  LACTIC ACID, PLASMA     Status: Abnormal   Collection Time    01/16/14  7:17 PM      Result Value Ref Range   Lactic Acid, Venous 2.7 (*) 0.5 - 2.2 mmol/L  URINE RAPID DRUG SCREEN (HOSP PERFORMED)     Status: Abnormal   Collection Time    01/16/14  8:14 PM      Result Value Ref Range   Opiates NONE DETECTED  NONE DETECTED   Cocaine NONE DETECTED  NONE DETECTED   Benzodiazepines POSITIVE (*) NONE DETECTED   Amphetamines NONE DETECTED  NONE DETECTED   Tetrahydrocannabinol POSITIVE (*) NONE DETECTED   Barbiturates NONE DETECTED  NONE DETECTED   Comment:            DRUG SCREEN FOR MEDICAL PURPOSES     ONLY.  IF CONFIRMATION IS NEEDED     FOR ANY PURPOSE, NOTIFY LAB     WITHIN 5 DAYS.                LOWEST DETECTABLE LIMITS     FOR URINE DRUG SCREEN     Drug Class       Cutoff (ng/mL)     Amphetamine      1000     Barbiturate      200     Benzodiazepine   903     Tricyclics       833     Opiates          300     Cocaine          300     THC              50  TROPONIN I     Status: Abnormal   Collection Time    01/16/14 10:04 PM      Result Value Ref Range   Troponin I 0.75 (*) <0.30 ng/mL   Comment:            Due to the release kinetics of cTnI,     a negative result within the first hours     of the onset of symptoms does not rule out     myocardial infarction with certainty.     If myocardial infarction is still suspected,     repeat the test at appropriate intervals.     CRITICAL VALUE NOTED.  VALUE IS CONSISTENT WITH PREVIOUSLY REPORTED AND CALLED VALUE.  TROPONIN I     Status: Abnormal   Collection Time    01/17/14  3:27 AM      Result Value Ref Range   Troponin I 0.66 (*) <0.30 ng/mL   Comment: CRITICAL VALUE NOTED.  VALUE IS CONSISTENT WITH PREVIOUSLY REPORTED AND CALLED VALUE.                Due to the  release kinetics of cTnI,     a negative result within the first hours     of the onset of symptoms does not rule out     myocardial infarction with certainty.     If myocardial infarction is still suspected,     repeat the test at appropriate intervals.  CBC  Status: Abnormal   Collection Time    01/17/14  3:27 AM      Result Value Ref Range   WBC 7.0  4.0 - 10.5 K/uL   RBC 3.38 (*) 3.87 - 5.11 MIL/uL   Hemoglobin 11.0 (*) 12.0 - 15.0 g/dL   HCT 30.8 (*) 36.0 - 46.0 %   MCV 91.1  78.0 - 100.0 fL   MCH 32.5  26.0 - 34.0 pg   MCHC 35.7  30.0 - 36.0 g/dL   RDW 14.7  11.5 - 15.5 %   Platelets 288  150 - 400 K/uL  COMPREHENSIVE METABOLIC PANEL     Status: Abnormal   Collection Time    01/17/14  3:27 AM      Result Value Ref Range   Sodium 137  137 - 147 mEq/L   Potassium 3.9  3.7 - 5.3 mEq/L   Comment: DELTA CHECK NOTED   Chloride 102  96 - 112 mEq/L   CO2 18 (*) 19 - 32 mEq/L   Glucose, Bld 90  70 - 99 mg/dL   BUN 6  6 - 23 mg/dL   Creatinine, Ser 0.58  0.50 - 1.10 mg/dL   Calcium 8.3 (*) 8.4 - 10.5 mg/dL   Total Protein 5.6 (*) 6.0 - 8.3 g/dL   Albumin 2.1 (*) 3.5 - 5.2 g/dL   AST 359 (*) 0 - 37 U/L   ALT 112 (*) 0 - 35 U/L   Alkaline Phosphatase 257 (*) 39 - 117 U/L   Total Bilirubin 0.9  0.3 - 1.2 mg/dL   GFR calc non Af Amer >90  >90 mL/min   GFR calc Af Amer >90  >90 mL/min   Comment: (NOTE)     The eGFR has been calculated using the CKD EPI equation.     This calculation has not been validated in all clinical situations.     eGFR's persistently <90 mL/min signify possible Chronic Kidney     Disease.   Anion gap 17 (*) 5 - 15    Ct Angio Chest Pe W/cm &/or Wo Cm  01/16/2014   CLINICAL DATA:  Chest pain and shortness of breath.  Tachycardia.  EXAM: CT ANGIOGRAPHY CHEST WITH CONTRAST  TECHNIQUE: Multidetector CT imaging of the chest was performed using the standard protocol during bolus administration of intravenous contrast. Multiplanar CT image reconstructions  and MIPs were obtained to evaluate the vascular anatomy.  CONTRAST:  169m OMNIPAQUE IOHEXOL 350 MG/ML SOLN  COMPARISON:  Chest x-ray dated 01/16/2014 and CT scans dated 08/23/2011 and 08/17/2008  FINDINGS: There are no pulmonary emboli or infiltrates or effusions. There is minimal stable scarring in the medial aspect of the right middle lobe. There is a small calcified lymph node at the level of the carina to the right.  Heart size is normal. RV/LV ratio is normal. Ascending thoracic aorta is slightly prominent at a diameter of 3.2 cm, stable.  The patient has developed hepatomegaly with diffuse hepatic steatosis since the prior exam.  No osseous abnormality.  Review of the MIP images confirms the above findings.  IMPRESSION: 1. No pulmonary emboli or other acute abnormalities of the chest. 2. New hepatomegaly and hepatic steatosis.   Electronically Signed   By: JRozetta NunneryM.D.   On: 01/16/2014 19:06   Dg Chest Portable 1 View  01/16/2014   CLINICAL DATA:  Chest pain.  EXAM: PORTABLE CHEST - 1 VIEW  COMPARISON:  01/10/2014.  FINDINGS: Mediastinum and hilar structures are normal.  Lungs are clear. Heart size normal. No pleural effusion or pneumothorax. Old right rib fractures are present.  IMPRESSION: No active disease.   Electronically Signed   By: Marcello Moores  Register   On: 01/16/2014 16:51    ROS See HPI Eyes: Negative Ears:Negative for hearing loss, tinnitus Cardiovascular: Negative for chest pain, palpitations,irregular heartbeat, dyspnea, dyspnea on exertion, near-syncope, orthopnea, paroxysmal nocturnal dyspnea and syncope,edema, claudication, cyanosis,.  Respiratory:   Negative for cough, hemoptysis, shortness of breath, sleep disturbances due to breathing, sputum production and wheezing.   Endocrine: Negative for cold intolerance and heat intolerance.  Hematologic/Lymphatic: Negative for adenopathy and bleeding problem. Does not bruise/bleed easily.  Musculoskeletal: Negative.    Gastrointestinal: Negative for nausea, vomiting, reflux, abdominal pain, diarrhea, constipation.   Genitourinary: Negative for bladder incontinence, dysuria, flank pain, frequency, hematuria, hesitancy, nocturia and urgency.  Neurological: Negative.  Allergic/Immunologic: Negative for environmental allergies.  Blood pressure 110/83, pulse 100, temperature 98.3 F (36.8 C), temperature source Oral, resp. rate 20, height '5\' 4"'  (1.626 m), weight 112 lb 1.6 oz (50.848 kg), last menstrual period 04/23/2011, SpO2 100.00%. Physical Exam PHYSICAL EXAM: Well-nournished, in no acute distress. Neck: No JVD, HJR, Bruit, or thyroid enlargement Lungs: No tachypnea, clear without wheezing, rales, or rhonchi Cardiovascular: RRR, PMI not displaced, heart sounds normal, no murmurs, gallops, bruit, thrill, or heave. Abdomen: BS normal. Soft without organomegaly, masses, lesions or tenderness. Extremities: without cyanosis, clubbing or edema. Good distal pulses bilateral SKin: Warm, no lesions or rashes  Musculoskeletal: No deformities Neuro: no focal signs  EKG: Sinus tachycardia, nonspecific ST changes.  01/10/14: Hemodynamics:   Central Aortic Pressure / Mean: 118/80/98 mmHg    Left Ventricular Pressure / LVEDP: 120/3/10 mmHg Left Ventriculography:   EF: 65-70 %   Wall Motion: Hyperdynamic Coronary Anatomy:   Dominance: Right  Left Main: Large caliber, short vessel that the LAD and Circumflex. Angiographically normal. LAD: Large caliber vessel with 2 small moderate caliber diagonal branches. It tapers distally around the apex. Minimal luminal irregularities. The distal LAD is very tortuous   Left Circumflex: Large caliber, nondominant vessel that courses as a lateral OM that has a small small AV groove branch. Tortuous but free of significant disease.   RCA: Large caliber, dominant vessel that is angiographically normal. There is a hairpin turn after the Crux of the vessel that has significant  systolic pinching giving the appearance of myocardial bridging. The vessel bifurcates distally into the Right Posterior Descending Artery (RPDA) and the Right Posterior AV Groove Branch (RPAV).    RPDA: Small-to-moderate caliber vessel which reaches two thirds with the apex. Tortuous and free of disease.    RPL Sysytem:The RPAV small-to- moderate branch gives off 2 small caliber posterolateral rashes. Minimal luminal irregularities.   2D echo Study Conclusions  - Left ventricle: Poor endocardial definition septal and inferior apical hypokinesis. The cavity size was normal. Wall thickness was normal. Systolic function was normal. The estimated ejection fraction was in the range of 50% to 55%. - Atrial septum: No defect or patent foramen ovale was identified.   Assessment/Plan:  Chest pain: Troponins still trending down from recent type 2 NSTEMI. Continue Imdur. Will Change to Toprol XL 50 mg daily. F/U already arranged on 02/05/14 in [place] office.  Type 2 NSTEMI 01/10/14. Angiographically normal coronary arteries with distal tortuosity and tapering.  Hypokalemia: Being replaced.  Confusion Most likely secondary to medications.  Anxiety/Depression  Tobacco Abuse: Smoking cessation discussed.  Ermalinda Barrios, PA-C   Attending note:  Patient seen  and examined. Reviewed recent records and discussed the case with Ms. Bonnell Public PA-C. Crystal Stein presents after recent discharge from [facility] with history outlined above, reporting recurrent chest pain in the setting of significant anxiety and psychosocial stressors. I have reviewed her recent hospital stay including testing results, and it seems most likely that she experienced a type II NSTEMI with mild stress induced cardiomyopathy rather than definite coronary vasospasm. She had angiographically normal coronary arteries which did have distal tapering and tortuosity, however she had no definitive spasm noted angiographically, or suggested  by ST segment change on ECG (usually ST segment elevation with spasm). There was inferoseptal and apical hypokinesis by echocardiogram, although endomyocardial borders were not well seen, and LVEF was 50-55%. Troponin I levels are still trending down from her recent event, and ECG shows no significant ST segment changes. Recommend stress management (may need assistance with this), smoking cessation, and medical therapy to include beta blocker and potentially a switch from Imdur to ACE inhibitor depending on heart rate and blood pressure. We will change to Toprol-XL 50 mg daily for now. No additional ischemic workup anticipated.  Satira Sark, M.D., F.A.C.C.

## 2014-01-17 NOTE — Progress Notes (Signed)
Pt's troponin's are trending down from 1.14 to 0.75 to 0.66. MD has been informed.

## 2014-01-17 NOTE — Care Management Note (Addendum)
    Page 1 of 1   01/19/2014     2:30:16 PM CARE MANAGEMENT NOTE 01/19/2014  Patient:  Crystal Stein,Crystal P   Account Number:  [id number]  Date Initiated:  01/17/2014  Documentation initiated by:  Anibal HendersonBOLDEN,GENEVA  Subjective/Objective Assessment:   Admitted with Chest pain and dehydration. Pt is from home, is independent, and will return home. She lives with her parents at present.     Action/Plan:   No needs identified   Anticipated DC Date:  01/18/2014   Anticipated DC Plan:  HOME/SELF CARE      DC Planning Services  CM consult      Choice offered to / List presented to:             Status of service:  Completed, signed off Medicare Important Message given?   (If response is "NO", the following Medicare IM given date fields will be blank) Date Medicare IM given:   Medicare IM given by:   Date Additional Medicare IM given:   Additional Medicare IM given by:    Discharge Disposition:  HOME/SELF CARE  Per UR Regulation:  Reviewed for med. necessity/level of care/duration of stay  If discussed at Long Length of Stay Meetings, dates discussed:    Comments:  01/19/14 1420 Arlyss Queenammy Aine Strycharz, RN BSN CM [facility] appt scheduled on [facility].  01/17/14 1300 Anibal HendersonGeneva Bolden RN/CM

## 2014-01-17 NOTE — Progress Notes (Signed)
Utilization Review Completed.Crystal Stein T7/29/2015  

## 2014-01-17 NOTE — Progress Notes (Signed)
Patient seen and examined. Note reviewed.  She presents to the hospital for recurrent chest pain. She was recently admitted at [facility] were she underwent cardiac catheterization for elevated troponins. She was found to have a type II non-ST elevation MI with patent coronaries. She was discharged home, presents to the hospital after 4 days with recurrent chest pain. Troponins are again noted to be elevated, but they're trending down from prior levels. Cardiology is consulted and does not feel that she needs further cardiac workup. Liver enzymes were noted to be significantly elevated from prior levels several years ago. She could possibly have some mild dehydration and elevated liver function tests were reactive. She'll be aggressively hydrated and we will repeat levels in the morning. We'll also check abdominal ultrasound to rule out any biliary pathology. Previous imaging to indicate fatty liver which may be the cause.  At this time, patient reports that she is unable to stand/per weight. She feels that she is unable to walk. She reports that she was walking prior to her cardiac catheterization has not walked since then. I asked her how she had gone home and has she gotten around to the past 4 days [facility], patient says she does not remember those 4 days. She reports that she is concerned since she is able to move her legs around in bed in a horizontal plane and is able to plantarflex her feet. She is able to flex both of her knees while lying in bed, but reports having difficulty raising her legs completely off the bed. Staff reports that she has been assisting them by raising her buttocks off the bed in order for them to place a bed pan. She did walk with the staff but they reported that she was stumbling. Her pulses are equal bilaterally in her lower extremities. Extremities are warm. Deep reflexes appear to be normal. Her exam is somewhat inconsistent. We'll request  physical therapy to evaluate. She denies any back pain or recent trauma. The patient is extremely nervous, and I suspect that her psychiatric problems may be playing a role in her current presentation as well.  Crystal Stein
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
## 2014-01-17 NOTE — Progress Notes (Signed)
[facility] PROGRESS NOTE  Crystal Stein ZOX:[id number] DOB: 12/09/1970 DOA: 01/16/2014 PCP: No PCP Per Patient  Assessment/Plan:    1. Chest pain atypical in setting of recent NSTEMI 01/10/14 and high anxiety regarding family issues. This am she is chest pain free. Troponin level is elevated but trending downward from levels 4 days ago. EKG without acute changes. CT angio chest negative for PE or any acute abnormality.  She was evaluated by cardiology who opine stress induced cardiomyopathy. Recommending stress management, smoking cessation as well as BB which was changed to long acting. Will monitor blood pressure  2. Anxiety/depression. Patient reports taking valium at home. UDS positive for benzos and marijuana. Reports significant stress around divorce and custody issues. Will request SW  3. Tobacco abuse. Cessation counseling offered 4. Tachycardia: improving. Home medication compliance some question. Cardiology adjusting medication. Will monitor 5. Acute encephalopathy: Patient confused regarding which hospital she was in i.e. [facility] vs [facility] Likely related to medications in setting of very recent hospitalization. Able to recall events leading to admission i.e. Chest pain. Patient UDS as above. Neuro exam benign. Will monitor. No indication for imaging at this point 6. Elevated transaminases. Etiology uncertain. May have component of fatty liver.  7. Metabolic acidosis: likely related to dehydration in setting of #2. Will provide vigorous IV hydration. monitor     Code Status: full Family Communication: none present Disposition Plan: home likely tomorrow   Consultants:  cardiology  Procedures:  none  Antibiotics:  none  HPI/Subjective: Awake alert. Denies pain/discomfort  Objective: Filed Vitals:   01/17/14 0454  BP: 110/83  Pulse: 100  Temp: 98.3 F (36.8 C)  Resp: 20    Intake/Output Summary (Last 24 hours) at 01/17/14 1121 Last data filed at  01/17/14 1100  Gross per 24 hour  Intake      0 ml  Output    850 ml  Net   -850 ml   Filed Weights   01/16/14 2228  Weight: 50.848 kg (112 lb 1.6 oz)    Exam:   General:  Thin appears comfortable  Cardiovascular: tachycardic but regular no MGR No LE edema  Respiratory: normal effort BS coarse throughout. No wheeze no rhonchi  Abdomen: soft +BS non-tender to palpation   Musculoskeletal: MAE joints without swelling/erythema   Data Reviewed: Basic Metabolic Panel:  Recent Labs Lab 01/10/14 1727 01/11/14 0500 01/16/14 1615 01/17/14 0327  NA 136* 137 135* 137  K 2.9* 4.1 3.2* 3.9  CL 95* 101 97 102  CO2 27 21 18* 18*  GLUCOSE 115* 93 113* 90  BUN 8 7 8 6   CREATININE 0.55 0.49* 0.49* 0.58  CALCIUM 8.8 8.2* 8.9 8.3*  MG 1.9  --  1.5  --    Liver Function Tests:  Recent Labs Lab 01/17/14 0327  AST 359*  ALT 112*  ALKPHOS 257*  BILITOT 0.9  PROT 5.6*  ALBUMIN 2.1*   No results found for this basename: LIPASE, AMYLASE,  in the last 168 hours No results found for this basename: AMMONIA,  in the last 168 hours CBC:  Recent Labs Lab 01/10/14 1727 01/11/14 0644 01/12/14 0300 01/16/14 1615 01/17/14 0327  WBC 6.9 5.8 4.7 6.9 7.0  NEUTROABS 3.3  --   --  3.3  --   HGB 13.8 12.9 10.2* 12.9 11.0*  HCT 38.7 36.2 29.2* 35.2* 30.8*  MCV 91.3 91.4 94.8 90.3 91.1  PLT 271 264 232 315 288   Cardiac Enzymes:  Recent Labs Lab  01/11/14 1105 01/16/14 1615 01/16/14 2204 01/17/14 0327 01/17/14 0939  TROPONINI 1.06* 1.14* 0.75* 0.66* 0.74*   BNP (last 3 results)  Recent Labs  01/11/14 0610  PROBNP 259.3*   CBG: No results found for this basename: GLUCAP,  in the last 168 hours  Recent Results (from the past 240 hour(s))  MRSA PCR SCREENING     Status: None   Collection Time    01/10/14  9:35 PM      Result Value Ref Range Status   MRSA by PCR NEGATIVE  NEGATIVE Final   Comment:            The GeneXpert MRSA Assay (FDA     approved for NASAL  specimens     only), is one component of a     comprehensive MRSA colonization     surveillance program. It is not     intended to diagnose MRSA     infection nor to guide or     monitor treatment for     MRSA infections.     Studies: Ct Angio Chest Pe W/cm &/or Wo Cm  01/16/2014   CLINICAL DATA:  Chest pain and shortness of breath.  Tachycardia.  EXAM: CT ANGIOGRAPHY CHEST WITH CONTRAST  TECHNIQUE: Multidetector CT imaging of the chest was performed using the standard protocol during bolus administration of intravenous contrast. Multiplanar CT image reconstructions and MIPs were obtained to evaluate the vascular anatomy.  CONTRAST:  100mL OMNIPAQUE IOHEXOL 350 MG/ML SOLN  COMPARISON:  Chest x-ray dated 01/16/2014 and CT scans dated 08/23/2011 and 08/17/2008  FINDINGS: There are no pulmonary emboli or infiltrates or effusions. There is minimal stable scarring in the medial aspect of the right middle lobe. There is a small calcified lymph node at the level of the carina to the right.  Heart size is normal. RV/LV ratio is normal. Ascending thoracic aorta is slightly prominent at a diameter of 3.2 cm, stable.  The patient has developed hepatomegaly with diffuse hepatic steatosis since the prior exam.  No osseous abnormality.  Review of the MIP images confirms the above findings.  IMPRESSION: 1. No pulmonary emboli or other acute abnormalities of the chest. 2. New hepatomegaly and hepatic steatosis.   Electronically Signed   By: Geanie CooleyJim  Maxwell M.D.   On: 01/16/2014 19:06   Dg Chest Portable 1 View  01/16/2014   CLINICAL DATA:  Chest pain.  EXAM: PORTABLE CHEST - 1 VIEW  COMPARISON:  01/10/2014.  FINDINGS: Mediastinum and hilar structures are normal. Lungs are clear. Heart size normal. No pleural effusion or pneumothorax. Old right rib fractures are present.  IMPRESSION: No active disease.   Electronically Signed   By: Maisie Fushomas  Register   On: 01/16/2014 16:51    Scheduled Meds: . heparin  5,000 Units  Subcutaneous 3 times per day  . isosorbide mononitrate  30 mg Oral Daily  . metoprolol succinate  50 mg Oral Daily  . pantoprazole  40 mg Oral Daily   Continuous Infusions: . sodium chloride 100 mL/hr at 01/16/14 2342    Principal Problem:   Chest pain Active Problems:   ANXIETY, CHRONIC   Tobacco abuse   Dyslipidemia   Chest pressure   Stress-induced cardiomyopathy   Acute encephalopathy   Substance abuse   Sinus tachycardia   Elevated transaminase level   Metabolic acidosis    Time spent: 35 mintues    [facility],Tamea Bai M  [facility] Pager [phone]. If 7PM-7AM, please contact night-coverage at [email or web address], password [email or web address]  01/17/2014, 11:21 AM  LOS: 1 day

## 2014-01-18 ENCOUNTER — Observation Stay (HOSPITAL_COMMUNITY): Payer: Medicaid Other

## 2014-01-18 ENCOUNTER — Encounter (HOSPITAL_COMMUNITY): Payer: Self-pay | Admitting: Radiology

## 2014-01-18 DIAGNOSIS — E876 Hypokalemia: Secondary | ICD-10-CM | POA: Diagnosis present

## 2014-01-18 DIAGNOSIS — R0789 Other chest pain: Secondary | ICD-10-CM

## 2014-01-18 LAB — URINALYSIS, ROUTINE W REFLEX MICROSCOPIC
BILIRUBIN URINE: NEGATIVE
Glucose, UA: NEGATIVE mg/dL
HGB URINE DIPSTICK: NEGATIVE
Ketones, ur: NEGATIVE mg/dL
Leukocytes, UA: NEGATIVE
NITRITE: POSITIVE — AB
PROTEIN: NEGATIVE mg/dL
Specific Gravity, Urine: 1.01 (ref 1.005–1.030)
UROBILINOGEN UA: 0.2 mg/dL (ref 0.0–1.0)
pH: 6.5 (ref 5.0–8.0)

## 2014-01-18 LAB — URINE MICROSCOPIC-ADD ON

## 2014-01-18 LAB — MAGNESIUM: Magnesium: 1.5 mg/dL (ref 1.5–2.5)

## 2014-01-18 LAB — COMPREHENSIVE METABOLIC PANEL
ALK PHOS: 284 U/L — AB (ref 39–117)
ALT: 84 U/L — ABNORMAL HIGH (ref 0–35)
AST: 196 U/L — ABNORMAL HIGH (ref 0–37)
Albumin: 2.1 g/dL — ABNORMAL LOW (ref 3.5–5.2)
Anion gap: 11 (ref 5–15)
BUN: 3 mg/dL — AB (ref 6–23)
CALCIUM: 7.8 mg/dL — AB (ref 8.4–10.5)
CO2: 21 mEq/L (ref 19–32)
CREATININE: 0.45 mg/dL — AB (ref 0.50–1.10)
Chloride: 107 mEq/L (ref 96–112)
GFR calc non Af Amer: >90 mL/min (ref >90)
GLUCOSE: 92 mg/dL (ref 70–99)
POTASSIUM: 3.1 meq/L — AB (ref 3.7–5.3)
Sodium: 139 mEq/L (ref 137–147)
Total Bilirubin: 1 mg/dL (ref 0.3–1.2)
Total Protein: 5.4 g/dL — ABNORMAL LOW (ref 6.0–8.3)

## 2014-01-18 MED ORDER — DIAZEPAM 2 MG PO TABS
2.0000 mg | ORAL_TABLET | Freq: Two times a day (BID) | ORAL | Status: DC | PRN
  Administered 2014-01-18 (×2): 2 mg via ORAL
  Filled 2014-01-18: qty 1

## 2014-01-18 MED ORDER — DIAZEPAM 5 MG PO TABS
5.0000 mg | ORAL_TABLET | Freq: Four times a day (QID) | ORAL | Status: DC | PRN
  Administered 2014-01-18 – 2014-02-01 (×16): 5 mg via ORAL
  Filled 2014-01-18 (×17): qty 1

## 2014-01-18 MED ORDER — POTASSIUM CHLORIDE CRYS ER 20 MEQ PO TBCR
40.0000 meq | EXTENDED_RELEASE_TABLET | ORAL | Status: AC
  Administered 2014-01-18 – 2014-01-19 (×2): 40 meq via ORAL
  Filled 2014-01-18 (×2): qty 2

## 2014-01-18 MED ORDER — POTASSIUM CHLORIDE CRYS ER 20 MEQ PO TBCR
40.0000 meq | EXTENDED_RELEASE_TABLET | ORAL | Status: AC
  Administered 2014-01-18: 40 meq via ORAL
  Filled 2014-01-18 (×3): qty 2

## 2014-01-18 MED ORDER — MAGNESIUM SULFATE 40 MG/ML IJ SOLN
2.0000 g | Freq: Once | INTRAMUSCULAR | Status: AC
  Administered 2014-01-18: 2 g via INTRAVENOUS
  Filled 2014-01-18: qty 50

## 2014-01-18 NOTE — Progress Notes (Signed)
[facility] PROGRESS NOTE  Crystal Stein UEA:[id number] DOB: 01-Oct-1970 DOA: 01/16/2014 PCP: No PCP Per Patient  Assessment/Plan: 1. Chest pain atypical in setting of recent NSTEMI 01/10/14 and high anxiety regarding family issues. Pain free until this am. Troponin levels have been elevated but trending downward and likely related to event 01/10/14. Repeat EKG with no acute changes. CT angio chest negative for PE or any acute abnormality. She was evaluated by cardiology who opine stress induced cardiomyopathy likely causing symptoms.  Recommending stress management, smoking cessation as well as BB which was changed to long acting. Will resume anti-anxiety agent.   2. Anxiety/depression. Patient reports taking valium at home. UDS positive for benzos and marijuana. Reports significant stress around divorce and custody issues. Appears restless and slightly irritable. Will resume home Valium. Request tele psych consult. Concern that extreme anxiety contributing to physical symptoms. Spoke with father who reports that patient was able to walk into house when discharged from hospital last week but she went to bed and did not get up. She "hollered 24/7 that 'im gonna die, i know im gonna die!'". She used bedpan and refused to walk.   3. Tobacco abuse. Cessation counseling offered 4. Tachycardia: stable. HR range 109-111. Home medication compliance some question. Cardiology adjusting medication. Will monitor 5. Acute encephalopathy: resolved. Likely related to medications in setting of very recent hospitalization. Patient UDS as above. Neuro exam benign.  6. Elevated transaminases. Etiology uncertain. Trending downward.  May have component of fatty liver. Await results of abdominal US.  Will likely need OP follow up.  7. Metabolic acidosis: resolved.  8. LE weakness: inbility to bear weight/stand. Able to move legs/hips in bed. Straight leg lift. Legs/lumbar spine non-tender. See #2. Will request  physical therapy evaluation. Request psych eval   Code Status: full Family Communication: father on phone Disposition Plan: unclear. Parents report inability to care for her. May need placement   Consultants:  Tele psych  Procedures:  none  Antibiotics:  none  HPI/Subjective: Lying in bed reporting inability to stand or walk. Complains chest pain and states "i am so nervous."  Objective: Filed Vitals:   01/18/14 0812  BP: 104/87  Pulse: 109  Temp: 97.8 F (36.6 C)  Resp: 24    Intake/Output Summary (Last 24 hours) at 01/18/14 1014 Last data filed at 01/18/14 0348  Gross per 24 hour  Intake    360 ml  Output   2150 ml  Net  -1790 ml   Filed Weights   01/16/14 2228  Weight: 50.848 kg (112 lb 1.6 oz)    Exam:   General:  Well nourished, anxious appearing  Cardiovascular: tachycardic but regular no MGR no LE edema  Respiratory: normal effort BS clear bilaterally no wheeze  Abdomen: flat soft +BS non-tender to palpation  Musculoskeletal: moving LE spontaneously in bed. Weak straight leg lift.   Data Reviewed: Basic Metabolic Panel:  Recent Labs Lab 01/16/14 1615 01/17/14 0327 01/18/14 0533 01/18/14 0756  NA 135* 137 139  --   K 3.2* 3.9 3.1*  --   CL 97 102 107  --   CO2 18* 18* 21  --   GLUCOSE 113* 90 92  --   BUN 8 6 3*  --   CREATININE 0.49* 0.58 0.45*  --   CALCIUM 8.9 8.3* 7.8*  --   MG 1.5  --   --  1.5   Liver Function Tests:  Recent Labs Lab 01/17/14 0327 01/18/14 0533  AST 359*  196*  ALT 112* 84*  ALKPHOS 257* 284*  BILITOT 0.9 1.0  PROT 5.6* 5.4*  ALBUMIN 2.1* 2.1*   No results found for this basename: LIPASE, AMYLASE,  in the last 168 hours No results found for this basename: AMMONIA,  in the last 168 hours CBC:  Recent Labs Lab 01/12/14 0300 01/16/14 1615 01/17/14 0327  WBC 4.7 6.9 7.0  NEUTROABS  --  3.3  --   HGB 10.2* 12.9 11.0*  HCT 29.2* 35.2* 30.8*  MCV 94.8 90.3 91.1  PLT 232 315 288   Cardiac  Enzymes:  Recent Labs Lab 01/11/14 1105 01/16/14 1615 01/16/14 2204 01/17/14 0327 01/17/14 0939  TROPONINI 1.06* 1.14* 0.75* 0.66* 0.74*   BNP (last 3 results)  Recent Labs  01/11/14 0610  PROBNP 259.3*   CBG: No results found for this basename: GLUCAP,  in the last 168 hours  Recent Results (from the past 240 hour(s))  MRSA PCR SCREENING     Status: None   Collection Time    01/10/14  9:35 PM      Result Value Ref Range Status   MRSA by PCR NEGATIVE  NEGATIVE Final   Comment:            The GeneXpert MRSA Assay (FDA     approved for NASAL specimens     only), is one component of a     comprehensive MRSA colonization     surveillance program. It is not     intended to diagnose MRSA     infection nor to guide or     monitor treatment for     MRSA infections.     Studies: Ct Angio Chest Pe W/cm &/or Wo Cm  01/16/2014   CLINICAL DATA:  Chest pain and shortness of breath.  Tachycardia.  EXAM: CT ANGIOGRAPHY CHEST WITH CONTRAST  TECHNIQUE: Multidetector CT imaging of the chest was performed using the standard protocol during bolus administration of intravenous contrast. Multiplanar CT image reconstructions and MIPs were obtained to evaluate the vascular anatomy.  CONTRAST:  OMNIPAQUE IOHEXOL 350 MG/ML SOLN  COMPARISON:  Chest x-ray dated 01/16/2014 and CT scans dated 08/23/2011 and 08/17/2008  FINDINGS: There are no pulmonary emboli or infiltrates or effusions. There is minimal stable scarring in the medial aspect of the right middle lobe. There is a small calcified lymph node at the level of the carina to the right.  Heart size is normal. RV/LV ratio is normal. Ascending thoracic aorta is slightly prominent at a diameter of 3.2 cm, stable.  The patient has developed hepatomegaly with diffuse hepatic steatosis since the prior exam.  No osseous abnormality.  Review of the MIP images confirms the above findings.  IMPRESSION: 1. No pulmonary emboli or other acute abnormalities  of the chest. 2. New hepatomegaly and hepatic steatosis.   Electronically Signed   By: Geanie Cooley M.D.   On: 01/16/2014 19:06   Dg Chest Portable 1 View  01/16/2014   CLINICAL DATA:  Chest pain.  EXAM: PORTABLE CHEST - 1 VIEW  COMPARISON:  01/10/2014.  FINDINGS: Mediastinum and hilar structures are normal. Lungs are clear. Heart size normal. No pleural effusion or pneumothorax. Old right rib fractures are present.  IMPRESSION: No active disease.   Electronically Signed   By: Maisie Fus  Register   On: 01/16/2014 16:51    Scheduled Meds: . heparin  5,000 Units Subcutaneous 3 times per day  . isosorbide mononitrate  30 mg Oral Daily  . metoprolol  succinate  50 mg Oral Daily  . pantoprazole  40 mg Oral Daily  . potassium chloride  40 mEq Oral Q4H   Continuous Infusions: . sodium chloride 125 mL/hr at 01/18/14 0500    Principal Problem:   Chest pain Active Problems:   ANXIETY, CHRONIC   Tobacco abuse   Dyslipidemia   Chest pressure   Stress-induced cardiomyopathy   Acute encephalopathy   Substance abuse   Sinus tachycardia   Elevated transaminase level   Metabolic acidosis   Hypokalemia    Time spent: 35 mintues     [facility] M  [facility] Pager [phone]. If 7PM-7AM, please contact night-coverage at [email or web address], password [facility] 01/18/2014, 10:14 AM  LOS: 2 days

## 2014-01-18 NOTE — Progress Notes (Signed)
PT Cancellation Note  Patient Details Name: Crystal DikeJennifer P Mcvey MRN: [id number] DOB: 04-Sep-1970   Cancelled Treatment:    Reason Eval/Treat Not Completed: Patient at procedure or test/unavailable  Received order, chart reviewed.  Pt currently being transported back from US -- will re-attempt evaluation as time allows.    Samaj Wessells 01/18/2014, 2:33 PM

## 2014-01-18 NOTE — Progress Notes (Signed)
Patient c/o chest pain rated a 3,says it feels like"pressure". Patient appears very anxious,emotional support provided.Vital signs documented in Docflowsheets timed at [phone] am.EKG obtained,and Dr Kerry HoughMemon notified of the results.Will continue to monitor patient.

## 2014-01-18 NOTE — Progress Notes (Signed)
    Please see full cardiology consultation note from yesterday with recommendations. She continues to have intermittent chest pain in the setting of significant anxiety. No major change in cardiac marker trend, flat and low-level abnormal. ECGs with nonspecific T wave changes, no acute ST segment changes. Current medical regimen includes Toprol-XL 50 mg daily and Imdur 30 mg daily. Would still suspect a stress induced cardiomyopathy based on her most recent cardiac evaluation, not clearly coronary vasospasm. Agree with planned evaluation by physical therapy and also Psychiatry.   Jonelle SidleSamuel G. Cyndie Woodbeck, M.D., F.A.C.C.

## 2014-01-18 NOTE — BH Assessment (Addendum)
Called to request start of TA. Jessica RN, will check to see if machine will work on medical floor. [phone]  Completed assessment. Paged attending Dr. Sharl MaLama for clarification regarding medical conditions, and intentions regarding results of TA.   Discussed results of TA with Crystal SamFran Hobson, NP who requests clarification from Dr. Sharl MaLama regarding reasons for TA.   Spoke with Dr. Sharl MaLama who reports medical exam is inconclusive regarding weakness in legs as pt is able to bear weight, flex knees and has no incontinence. He recommends that TTS follow up after pt is seen by neurology. If that exam reveals mobility issues are most likely related to psychiatric issues they would [facility].   Per Crystal SamFran Hobson, NP pt will need to be reassessed once she is cleared by neurology.   Crystal Stein, [facility] Triage Specialist 01/18/2014 9:01 PM
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
## 2014-01-18 NOTE — Progress Notes (Signed)
Patient has order for Telepsych evaluation,when calling Therapeutic Triage services,there is no answer,message was left with Donnel SaxonSheila Williams receptionist for [facility].Will  re- attempt as time allows.

## 2014-01-18 NOTE — Progress Notes (Signed)
[facility], this time I spoke with Sandi Ravelingom Hughes,he gathered information from me,and informed me that he would get back in touch with nurse,when time allows,and when a space becomes open.Will continue to monitor patient. Telepsych monitor set up ,and ready for use in patient's room,awaiting call back.

## 2014-01-18 NOTE — Progress Notes (Signed)
Patient seen and examined. Note reviewed.  She continues to complain of weakness in her lower extremities bilaterally. She is able to flex her knee as well as her hip, and has trouble extending her knee and hip. She reports that she is unable to stand and bear weight. She's not had any urinary incontinence. Her reflexes appear to be intact. Plantars are downgoing bilaterally. I suspect that her weakness may be related to a psychiatric component. Patient is a poor historian and reports that she often does not remember much of her history. I'm unsure whether she has any secondary gain from her weakness. For completeness, we will request neurology to evaluate the patient to see if any further testing is needed. We'll request imaging and TSH/RPR/B12.  Regarding her significant anxiety, we have requested a psychiatry consultation. Since her psychiatric illness is playing a significant role in her ability to ambulate, will ask psychiatry to evaluate to see if she meets inpatient criteria.  Teaser trending down with hydration. Likely related to dehydration. Abdominal ultrasound does not show any biliary pathology. Doesn't feel that he liver which appears to be a chronic problem. Continue current treatments.  Her chest this was related to anxiety. She's not had any EKG changes. Cardiac enzymes are stable. No further cardiac testing as recommended by cardiology.  MEMON,JEHANZEB

## 2014-01-19 DIAGNOSIS — E440 Moderate protein-calorie malnutrition: Secondary | ICD-10-CM | POA: Diagnosis not present

## 2014-01-19 DIAGNOSIS — I2510 Atherosclerotic heart disease of native coronary artery without angina pectoris: Secondary | ICD-10-CM | POA: Diagnosis present

## 2014-01-19 DIAGNOSIS — E872 Acidosis, unspecified: Secondary | ICD-10-CM | POA: Diagnosis present

## 2014-01-19 DIAGNOSIS — E038 Other specified hypothyroidism: Secondary | ICD-10-CM | POA: Diagnosis present

## 2014-01-19 DIAGNOSIS — I498 Other specified cardiac arrhythmias: Secondary | ICD-10-CM | POA: Diagnosis present

## 2014-01-19 DIAGNOSIS — N390 Urinary tract infection, site not specified: Secondary | ICD-10-CM | POA: Diagnosis present

## 2014-01-19 DIAGNOSIS — J449 Chronic obstructive pulmonary disease, unspecified: Secondary | ICD-10-CM | POA: Diagnosis present

## 2014-01-19 DIAGNOSIS — F319 Bipolar disorder, unspecified: Secondary | ICD-10-CM | POA: Diagnosis present

## 2014-01-19 DIAGNOSIS — Z833 Family history of diabetes mellitus: Secondary | ICD-10-CM | POA: Diagnosis not present

## 2014-01-19 DIAGNOSIS — F411 Generalized anxiety disorder: Secondary | ICD-10-CM | POA: Diagnosis present

## 2014-01-19 DIAGNOSIS — I214 Non-ST elevation (NSTEMI) myocardial infarction: Secondary | ICD-10-CM | POA: Diagnosis present

## 2014-01-19 DIAGNOSIS — R7402 Elevation of levels of lactic acid dehydrogenase (LDH): Secondary | ICD-10-CM | POA: Diagnosis present

## 2014-01-19 DIAGNOSIS — Z79899 Other long term (current) drug therapy: Secondary | ICD-10-CM | POA: Diagnosis not present

## 2014-01-19 DIAGNOSIS — I5181 Takotsubo syndrome: Secondary | ICD-10-CM | POA: Diagnosis present

## 2014-01-19 DIAGNOSIS — F1210 Cannabis abuse, uncomplicated: Secondary | ICD-10-CM | POA: Diagnosis present

## 2014-01-19 DIAGNOSIS — R339 Retention of urine, unspecified: Secondary | ICD-10-CM | POA: Diagnosis present

## 2014-01-19 DIAGNOSIS — E875 Hyperkalemia: Secondary | ICD-10-CM | POA: Diagnosis present

## 2014-01-19 DIAGNOSIS — G8250 Quadriplegia, unspecified: Secondary | ICD-10-CM | POA: Diagnosis not present

## 2014-01-19 DIAGNOSIS — B961 Klebsiella pneumoniae [K. pneumoniae] as the cause of diseases classified elsewhere: Secondary | ICD-10-CM | POA: Diagnosis present

## 2014-01-19 DIAGNOSIS — R32 Unspecified urinary incontinence: Secondary | ICD-10-CM | POA: Diagnosis present

## 2014-01-19 DIAGNOSIS — G9349 Other encephalopathy: Secondary | ICD-10-CM | POA: Diagnosis present

## 2014-01-19 DIAGNOSIS — G610 Guillain-Barre syndrome: Secondary | ICD-10-CM | POA: Diagnosis present

## 2014-01-19 DIAGNOSIS — Z9851 Tubal ligation status: Secondary | ICD-10-CM | POA: Diagnosis not present

## 2014-01-19 DIAGNOSIS — K219 Gastro-esophageal reflux disease without esophagitis: Secondary | ICD-10-CM | POA: Diagnosis present

## 2014-01-19 DIAGNOSIS — I10 Essential (primary) hypertension: Secondary | ICD-10-CM | POA: Diagnosis present

## 2014-01-19 DIAGNOSIS — F17200 Nicotine dependence, unspecified, uncomplicated: Secondary | ICD-10-CM | POA: Diagnosis present

## 2014-01-19 DIAGNOSIS — E876 Hypokalemia: Secondary | ICD-10-CM | POA: Diagnosis present

## 2014-01-19 DIAGNOSIS — E871 Hypo-osmolality and hyponatremia: Secondary | ICD-10-CM | POA: Diagnosis present

## 2014-01-19 DIAGNOSIS — R29898 Other symptoms and signs involving the musculoskeletal system: Secondary | ICD-10-CM | POA: Diagnosis present

## 2014-01-19 DIAGNOSIS — Z5189 Encounter for other specified aftercare: Secondary | ICD-10-CM | POA: Diagnosis not present

## 2014-01-19 LAB — COMPREHENSIVE METABOLIC PANEL
ALBUMIN: 2.3 g/dL — AB (ref 3.5–5.2)
ALT: 89 U/L — ABNORMAL HIGH (ref 0–35)
AST: 225 U/L — AB (ref 0–37)
Alkaline Phosphatase: 337 U/L — ABNORMAL HIGH (ref 39–117)
Anion gap: 10 (ref 5–15)
CALCIUM: 8.2 mg/dL — AB (ref 8.4–10.5)
CHLORIDE: 107 meq/L (ref 96–112)
CO2: 20 mEq/L (ref 19–32)
CREATININE: 0.42 mg/dL — AB (ref 0.50–1.10)
GFR calc Af Amer: >90 mL/min (ref >90)
GFR calc non Af Amer: >90 mL/min (ref >90)
Glucose, Bld: 81 mg/dL (ref 70–99)
Potassium: 6.2 mEq/L — ABNORMAL HIGH (ref 3.7–5.3)
Sodium: 137 mEq/L (ref 137–147)
Total Bilirubin: 1.1 mg/dL (ref 0.3–1.2)
Total Protein: 5.9 g/dL — ABNORMAL LOW (ref 6.0–8.3)

## 2014-01-19 LAB — VITAMIN B12: VITAMIN B 12: 812 pg/mL (ref 211–911)

## 2014-01-19 LAB — HIV ANTIBODY (ROUTINE TESTING W REFLEX): HIV: NONREACTIVE

## 2014-01-19 LAB — RPR

## 2014-01-19 LAB — LIPASE, BLOOD: LIPASE: 44 U/L (ref 11–59)

## 2014-01-19 LAB — POTASSIUM: Potassium: 4.8 mEq/L (ref 3.7–5.3)

## 2014-01-19 LAB — TSH: TSH: 6.82 u[IU]/mL — AB (ref 0.350–4.500)

## 2014-01-19 MED ORDER — CIPROFLOXACIN HCL 250 MG PO TABS
250.0000 mg | ORAL_TABLET | Freq: Two times a day (BID) | ORAL | Status: AC
  Administered 2014-01-19 – 2014-01-26 (×16): 250 mg via ORAL
  Filled 2014-01-19 (×18): qty 1

## 2014-01-19 MED ORDER — SODIUM POLYSTYRENE SULFONATE 15 GM/60ML PO SUSP
30.0000 g | Freq: Once | ORAL | Status: AC
  Administered 2014-01-19: 30 g via ORAL
  Filled 2014-01-19 (×2): qty 120

## 2014-01-19 NOTE — Progress Notes (Signed)
Pt vomitted after med administration, brown, clear with visible meds present.  zofran given for relief.  Pt present at time treatment delayed.

## 2014-01-19 NOTE — Consult Note (Signed)
[place] A. Merlene Laughter, MD     [email or web address]          Crystal Stein is an 43 y.o. female.   ASSESSMENT/PLAN: 1. Acute leg weakness and numbness of the upper and lower extremities associated with areflexia. 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 Consequently, the patient will be sent to have a spinal tap. Because the patient can get acutely worse, we'll do a NIF and the FVC. This be done daily to assess for respiratory suppression. If her NIF and FVC decreases especially in the setting of upper extremity weakness, we should elect to have the patient be observed in the ICU and even consider elective intubation. She should remain on telemetry because of the increased risk of tachycardia dysrhythmias. Once the spinal tap has been done, she'll be set up to receive immunoglobulins 400 mg/kg daily for 5 days.  This is a 43 year old white female who was recently hospitalized for chest pain. [facility] angiogramOf the coronary vessels. They were clean. She has been diagnosed with a non-Elevated ST segmentMI with elevated troponin one. She presented with chest pain again and cardiology was consulted. Troponin 1 has been trending down. A new event was thought not to be likely. The patient On yesterday that she has developed numbness and weakness of the legs. She also reports numbness of the hands. This is a finding for the patient. She does not have follow-up by her symptoms. She does not report headaches. She has no dizziness, dysarthria, dysphagia or visual symptoms. There is no shortness of breath. The review of systems otherwise negative.  GENERAL: Thin pleasant female in no acute distress.  HEENT: Supple. Atraumatic normocephalic. Edentulous.  ABDOMEN: soft  EXTREMITIES: No edema   BACK: Normal.  SKIN: A few ecchymotic spots of the upper and lower  extremities. There is a larger spots involving the right groin status post cardiac catheterization.    MENTAL STATUS: Alert and oriented. Speech, language and cognition are generally intact. Judgment and insight normal.   CRANIAL NERVES: Pupils are equal, round and reactive to light and accommodation; extra ocular movements are full, there is no significant nystagmus; visual fields are full; upper and lower facial muscles are normal in strength and symmetric, there is no flattening of the nasolabial folds; tongue is midline; uvula is midline; shoulder elevation is normal.  MOTOR: Bulk and tone are normal throughout. Deltoid 4/5; hand grip 5; hip flexion 3/5 and dorsiflexion 4+/5.  COORDINATION: Left finger to nose is normal, right finger to nose is normal, No rest tremor; no intention tremor; no postural tremor; no bradykinesia.  REFLEXES: Deep tendon reflexes are symmetrical But totally areflexic in the legs even with augmentation. There are normal in the upper extremities. Babinski reflexes are flexor bilaterally.   SENSATION: Markedly diminished sensation to light his symptoms involving the legs, hands and anterior abdominal and chest region but sparing of the back and neck.The pattern is most consistent with radicular sensory loss. There is no sensory level.     Past Medical History  Diagnosis Date  . Anxiety   . Depression   . Asthma   . 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 COPD (chronic obstructive pulmonary disease)   . GERD (gastroesophageal reflux disease)   . Bipolar disorder   . Blood transfusion 2006  . History of cardiac catheterization July 2015    Angiographically normal  coronary arteries with distal tortuosity  . NSTEMI (non-ST elevated myocardial infarction)     Not secondary to obstructive CAD or obvious plaque rupture, possibly vasospasm    Past Surgical History  Procedure Laterality Date  . Tennis elbow release  04/27/06    Left; [facility],  [facility]  . Multiple tooth extractions  10/07    Full upper  . Orif proximal humerus fracture  08/06    Left,  . Dilation and curettage of uterus  June 2012    Failed ablation  . Tubal ligation  June 2012    [facility]  . Bipolar disorder    . Hysteroscopy  01/27/2011    Procedure: HYSTEROSCOPY;  Surgeon: Jonnie Kind, MD;  Location: [facility];  Service: Gynecology;  Laterality: N/A;  . Vaginal hysterectomy  05/19/2011    Procedure: HYSTERECTOMY VAGINAL;  Surgeon: Jonnie Kind, MD;  Location: [facility];  Service: Gynecology;  Laterality: N/A;  . Orif wrist fracture  06/17/2012    Procedure: OPEN REDUCTION INTERNAL FIXATION (ORIF) WRIST FRACTURE;  Surgeon: Carole Civil, MD;  Location: [facility];  Service: Orthopedics;  Laterality: Left;  . Carpal tunnel release  06/17/2012    Procedure: CARPAL TUNNEL RELEASE;  Surgeon: Carole Civil, MD;  Location: [facility];  Service: Orthopedics;  Laterality: Left;    Family History  Problem Relation Age of Onset  . Anesthesia problems Neg Hx   . Hypotension Neg Hx   . Malignant hyperthermia Neg Hx   . Pseudochol deficiency Neg Hx   . Diabetes      Social History:  reports that she has been smoking Cigarettes.  She has a 22 pack-year smoking history. She does not have any smokeless tobacco history on file. She reports that she does not drink alcohol or use illicit drugs.  Allergies: No Known Allergies  Medications: Prior to Admission medications   Medication Sig Start Date End Date Taking? Authorizing Provider  acetaminophen (TYLENOL) 500 MG tablet Take 500 mg by mouth every 6 (six) hours as needed for mild pain or moderate pain.   Yes Historical Provider, MD  isosorbide mononitrate (IMDUR) 30 MG 24 hr tablet Take 1 tablet (30 mg total) by mouth daily. 01/12/14  Yes Tarri Fuller, PA-C  metoprolol tartrate (LOPRESSOR) 25 MG tablet Take 1 tablet (25 mg total) by mouth 2 (two) times daily. 01/12/14  Yes Tarri Fuller, PA-C  nitroGLYCERIN (NITROSTAT) 0.4  MG SL tablet Place 1 tablet (0.4 mg total) under the tongue every 5 (five) minutes x 3 doses as needed for chest pain. 01/12/14  Yes Tarri Fuller, PA-C  omeprazole (PRILOSEC) 20 MG capsule Take 20 mg by mouth daily.     Yes Historical Provider, MD    Scheduled Meds: . ciprofloxacin  250 mg Oral BID  . heparin  5,000 Units Subcutaneous 3 times per day  . isosorbide mononitrate  30 mg Oral Daily  . metoprolol succinate  50 mg Oral Daily  . pantoprazole  40 mg Oral Daily   Continuous Infusions: . sodium chloride 125 mL/hr at 01/19/14 1510   PRN Meds:.acetaminophen, diazepam, nitroGLYCERIN, ondansetron (ZOFRAN) IV, ondansetron   Blood pressure 136/99, pulse 110, temperature 98 F (36.7 C), temperature source Oral, resp. rate 18, height '5\' 4"'  (1.626 m), weight 50.848 kg (112 lb 1.6 oz), last menstrual period 04/23/2011, SpO2 100.00%.   Results for orders placed during the hospital encounter of 01/16/14 (from the past 48 hour(s))  URINALYSIS, ROUTINE W REFLEX MICROSCOPIC  Status: Abnormal   Collection Time    01/18/14 12:10 AM      Result Value Ref Range   Color, Urine YELLOW  YELLOW   APPearance CLEAR  CLEAR   Specific Gravity, Urine 1.010  1.005 - 1.030   pH 6.5  5.0 - 8.0   Glucose, UA NEGATIVE  NEGATIVE mg/dL   Hgb urine dipstick NEGATIVE  NEGATIVE   Bilirubin Urine NEGATIVE  NEGATIVE   Ketones, ur NEGATIVE  NEGATIVE mg/dL   Protein, ur NEGATIVE  NEGATIVE mg/dL   Urobilinogen, UA 0.2  0.0 - 1.0 mg/dL   Nitrite POSITIVE (*) NEGATIVE   Leukocytes, UA NEGATIVE  NEGATIVE  URINE MICROSCOPIC-ADD ON     Status: Abnormal   Collection Time    01/18/14 12:10 AM      Result Value Ref Range   Squamous Epithelial / LPF MANY (*) RARE   WBC, UA 0-2  <3 WBC/hpf   Bacteria, UA MANY (*) RARE  COMPREHENSIVE METABOLIC PANEL     Status: Abnormal   Collection Time    01/18/14  5:33 AM      Result Value Ref Range   Sodium 139  137 - 147 mEq/L   Potassium 3.1 (*) 3.7 - 5.3 mEq/L   Comment:  DELTA CHECK NOTED   Chloride 107  96 - 112 mEq/L   CO2 21  19 - 32 mEq/L   Glucose, Bld 92  70 - 99 mg/dL   BUN 3 (*) 6 - 23 mg/dL   Creatinine, Ser 0.45 (*) 0.50 - 1.10 mg/dL   Calcium 7.8 (*) 8.4 - 10.5 mg/dL   Total Protein 5.4 (*) 6.0 - 8.3 g/dL   Albumin 2.1 (*) 3.5 - 5.2 g/dL   AST 196 (*) 0 - 37 U/L   ALT 84 (*) 0 - 35 U/L   Alkaline Phosphatase 284 (*) 39 - 117 U/L   Total Bilirubin 1.0  0.3 - 1.2 mg/dL   GFR calc non Af Amer >90  >90 mL/min   GFR calc Af Amer >90  >90 mL/min   Comment: (NOTE)     The eGFR has been calculated using the CKD EPI equation.     This calculation has not been validated in all clinical situations.     eGFR's persistently <90 mL/min signify possible Chronic Kidney     Disease.   Anion gap 11  5 - 15  MAGNESIUM     Status: None   Collection Time    01/18/14  7:56 AM      Result Value Ref Range   Magnesium 1.5  1.5 - 2.5 mg/dL  URINE CULTURE     Status: None   Collection Time    01/18/14  9:25 AM      Result Value Ref Range   Specimen Description URINE, CLEAN CATCH     Special Requests NONE     Culture  Setup Time       Value: 01/18/2014 19:44     Performed at [facility]       Value: >=100,000 COLONIES/ML     Performed at [facility]   Culture       Value: [place]     Performed at [facility]   Report Status PENDING    TSH     Status: Abnormal   Collection Time    01/18/14  5:38 PM      Result Value Ref Range  TSH 6.820 (*) 0.350 - 4.500 uIU/mL   Comment: Performed at [facility]  RPR     Status: None   Collection Time    01/18/14  5:38 PM      Result Value Ref Range   RPR NON REAC  NON REAC   Comment: Performed at [place]     Status: None   Collection Time    01/18/14  5:38 PM      Result Value Ref Range   Vitamin B-12 812  211 - 911 pg/mL   Comment: Performed at [facility] (ROUTINE TESTING)     Status: None    Collection Time    01/18/14  5:38 PM      Result Value Ref Range   HIV 1&2 Ab, 4th Generation NONREACTIVE  NONREACTIVE   Comment: (NOTE)     A NONREACTIVE HIV Ag/Ab result does not exclude HIV infection since     the time frame for seroconversion is variable. If acute HIV infection     is suspected, a HIV-1 RNA Qualitative TMA test is recommended.     HIV-1/2 Antibody Diff         Not indicated.     HIV-1 RNA, Qual TMA           Not indicated.     PLEASE NOTE: This information has been disclosed to you from records     whose confidentiality may be protected by state law. If your state     requires such protection, then the state law prohibits you from making     any further disclosure of the information without the specific written     consent of the person to whom it pertains, or as otherwise permitted     by law. A general authorization for the release of medical or other     information is NOT sufficient for this purpose.     The performance of this assay has not been clinically validated in     patients less than 15 years old.     Performed at [place]     Status: Abnormal   Collection Time    01/19/14  6:02 AM      Result Value Ref Range   Sodium 137  137 - 147 mEq/L   Potassium 6.2 (*) 3.7 - 5.3 mEq/L   Comment: DELTA CHECK NOTED   Chloride 107  96 - 112 mEq/L   CO2 20  19 - 32 mEq/L   Glucose, Bld 81  70 - 99 mg/dL   BUN <3 (*) 6 - 23 mg/dL   Creatinine, Ser 0.42 (*) 0.50 - 1.10 mg/dL   Calcium 8.2 (*) 8.4 - 10.5 mg/dL   Total Protein 5.9 (*) 6.0 - 8.3 g/dL   Albumin 2.3 (*) 3.5 - 5.2 g/dL   AST 225 (*) 0 - 37 U/L   ALT 89 (*) 0 - 35 U/L   Alkaline Phosphatase 337 (*) 39 - 117 U/L   Total Bilirubin 1.1  0.3 - 1.2 mg/dL   GFR calc non Af Amer >90  >90 mL/min   GFR calc Af Amer >90  >90 mL/min   Comment: (NOTE)     The eGFR has been calculated using the CKD EPI equation.     This calculation has not been validated in all  clinical situations.     eGFR's persistently <90 mL/min signify possible  Chronic Kidney     Disease.   Anion gap 10  5 - 15  POTASSIUM     Status: None   Collection Time    01/19/14  5:45 PM      Result Value Ref Range   Potassium 4.8  3.7 - 5.3 mEq/L   Comment: DELTA CHECK NOTED  LIPASE, BLOOD     Status: None   Collection Time    01/19/14  5:45 PM      Result Value Ref Range   Lipase 44  11 - 59 U/L    Mr Lumbar Spine Wo Contrast  01/18/2014   CLINICAL DATA:  Bilateral lower extremity weakness.  EXAM: MRI LUMBAR SPINE WITHOUT CONTRAST  TECHNIQUE: Multiplanar, multisequence MR imaging of the lumbar spine was performed. No intravenous contrast was administered.  COMPARISON:  CT abdomen and pelvis 08/22/2008  FINDINGS: Vertebral alignment is normal. The L4 and L5 vertebral bodies as well as the visualized upper sacrum appear mildly hypoplastic/reduced in AP diameter, with prominence of the lower lumbar and upper sacral thecal sac. No definite posterior vertebral scalloping is seen. Prominent nerve root sleeves/perineural cysts are noted in the upper sacrum. Vertebral bone marrow signal is within normal limits. The conus medullaris is normal in signal and terminates at L1. Cauda equina is normal in appearance. There is no disc herniation, spinal canal stenosis, or neural foraminal stenosis. Right renal cyst is again seen.  IMPRESSION: 1. No disc herniation or stenosis. 2. 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   Electronically Signed   By: Logan Bores   On: 01/18/2014 21:30   US Abdomen Limited Ruq  01/18/2014   CLINICAL DATA:  Elevated LFTs  EXAM: US ABDOMEN LIMITED - RIGHT UPPER QUADRANT  COMPARISON:  None  FINDINGS: Gallbladder:  Only minimally distended. No definite wall thickening, pericholecystic fluid, stones or sonographic Murphy sign.  Common bile duct:  Diameter: 5 mm diameter common normal  Liver:  Echogenic, likely fatty  infiltration, though this can be seen with cirrhosis and certain infiltrative disorders. Question minimally enlarged. No focal hepatic mass or nodularity. Hepatopetal portal venous flow. No intrahepatic biliary dilatation.  No RIGHT upper quadrant ascites identified.  IMPRESSION: Probable fatty infiltration of a minimally enlarged liver as above.  No definite gallstones or biliary obstruction identified.   Electronically Signed   By: Lavonia Dana M.D.   On: 01/18/2014 14:28        Elai [place] A. Merlene Laughter, M.D.  Diplomate, Tax adviser of Psychiatry and Neurology ( Neurology). 01/19/2014, 7:14 PM

## 2014-01-19 NOTE — Evaluation (Signed)
Physical Therapy Evaluation Patient Details Name: Crystal Stein MRN: [id number] DOB: March 17, 1971 Today's Date: 01/19/2014   History of Present Illness  This is a 43 year old lady who was recently in the hospital approximately 5 days ago when she presented with chest pain. Troponin levels were elevated. She was transferred to [facility] and underwent cardiac catheterization which showed clean coronary arteries. It was felt that she had a non-ST elevation MI. Ejection fraction was at low normal. She presents once again with chest pain which is central in nature, dull. It does not seem to radiate. There is no sweating but she did have nausea.  She tells me that she has been separated from her husband for the last year and the husband is not allowing her to see her children with whom they are. She feels that this is affecting her significantly and possibly causing the chest pain. She has a history of anxiety/depression and possible history of bipolar disorder she smokes cigarettes which she apparently stopped in the last few days. She is now being admitted for further investigation.  Clinical Impression  Pt is a 43 year old female who presents to physical therapy with dx of chest pain.  Pt reports she was previously (I) with bed mobility skills, transfers, and ambulation skills, though per chart (per family report) pt has had limited functional mobility skills since leaving [facility] stating she wanted to die or couldn't move.  During evaluation, pt reported varying levels of pain to RN and PT, though levels were severe in LE and medication given prior to PT evaluation.  Pt was able to actively move (B) LE in bed when asked and to (I) put on socks.  Pt was mod (I)/min guard for bed mobility skills, and able to maintain good sitting balance while at EOB.  However, when instructed to attempt to stand pt became very anxious stating "i can't" and "i'm afraid of falling".  Reassured pt that PT was  there to assist and prevent falls.  Sit <-> stand transfers attempted with max assist and bed elevated, however pt resisted all attempts with transfers, despite reassurance and rest breaks as needed.  Unable to attempt gait as pt unable to stand at this time.  Pt verbalized understanding of inability to return home if she was unable to stand/ambulate as family was unable to care for her in her current state.  Pt stated "i want to get better", though refused to attempt standing again.  Unsure of recommendations for discharge at this time as pt unwilling to attempt functional mobility due to anxiety at this time; will continue to treat and assess functional mobility and offer reassurance to improve skills.      Follow Up Recommendations Other (comment) (Unsure at this time; will continue to assess for recommendation. )    Equipment Recommendations  Other (comment) (Unsure; attempted use of RW for transfers though pt unable to stand.  Pt may need AD for mobility skills as functional mobility/strength improve. )    Recommendations for Other Services OT consult     Precautions / Restrictions Precautions Precautions: Fall Restrictions Weight Bearing Restrictions: No      Mobility  Bed Mobility Overal bed mobility: Needs Assistance Bed Mobility: Supine to Sit;Sit to Supine     Supine to sit: Modified independent (Device/Increase time) Sit to supine: Min guard (to ensure LE on bed)   General bed mobility comments: Pt was able to scoot to the Lt to the [facility] with VC for  technique.  Pt able to sit at EOB without physical assist or complaints of pain in (B) legs with WB on floor while in sitting.    Transfers Overall transfer level: Needs assistance Equipment used: Rolling walker (2 wheeled) Transfers: Sit to/from Stand Sit to Stand: From elevated surface;Total assist         General transfer comment: Per RN, pt required total assist to get on/off commode this morning.  Attempted sit <->  stand transfers, however pt resisted standing stating "i can't" and reporting she was terrified/anxious of falling.  Bed elevated to decrease difficulty, and reassured pt on PT assist for transfer for safety/to prevent falls and reattempted, however pt continued to resist standing (despite bed elevated/max assist) stating "i can't".    Ambulation/Gait         Gait velocity: Unable to attempt at this time.          Balance Overall balance assessment: Needs assistance Sitting-balance support: Feet supported;No upper extremity supported Sitting balance-Leahy Scale: Good     Standing balance support: Bilateral upper extremity supported;During functional activity Standing balance-Leahy Scale: Zero                               Pertinent Vitals/Pain Pain 7/10 in (B) LE; pt was given medication prior to PT evaluation.     Home Living Family/patient expects to be discharged to:: Unsure                 Additional Comments: Per patient, she lived with her parents, however she states (and noted in chart) that they are currently unable to care for her in her current state.      Prior Function Level of Independence: Needs assistance;Independent   Gait / Transfers Assistance Needed: Prior to all her progressing health issues, pt was (I) with bed moibility skills, transfers, and ambulation skills.  Per chart, pt has been unable to transfer or amb for the last week stating "i can't"           Hand Dominance   Dominant Hand: Right    Extremity/Trunk Assessment               Lower Extremity Assessment: Generalized weakness;RLE deficits/detail;LLE deficits/detail RLE Deficits / Details: Limited testing seconary to complaints of severe pain in (B) legs.  Pt was able to actively complete heel slides to put on socks without limitations.  LLE Deficits / Details: Limited testing secondary to complaints of severe pain in (B) legs.  Pt was able to actively complete  heel slides to put on socks without limitations.      Communication   Communication: No difficulties  Cognition Arousal/Alertness: Awake/alert Behavior During Therapy: WFL for tasks assessed/performed Overall Cognitive Status: No family/caregiver present to determine baseline cognitive functioning                               Assessment/Plan    PT Assessment Patient needs continued PT services  PT Diagnosis Difficulty walking;Generalized weakness;Acute pain   PT Problem List Decreased strength;Decreased knowledge of use of DME;Decreased activity tolerance;Decreased balance;Decreased mobility;Pain  PT Treatment Interventions Balance training;Gait training;Neuromuscular re-education;Functional mobility training;Therapeutic activities;Therapeutic exercise;Patient/family education   PT Goals (Current goals can be found in the Care Plan section) Acute Rehab PT Goals Patient Stated Goal: be (I) again PT Goal Formulation: With patient Time For Goal Achievement: 02/02/14 Potential to  Achieve Goals: Fair    Frequency Min 3X/week   Barriers to discharge Inaccessible home environment;Decreased caregiver support         End of Session Equipment Utilized During Treatment: Gait belt Activity Tolerance: Patient limited by pain;Other (comment) (Limited by active participation in standing) Patient left: in bed;with call bell/phone within reach;with bed alarm set Nurse Communication: Mobility status    Functional Assessment Tool Used: Clinical Judgement Functional Limitation: Mobility: Walking and moving around Mobility: Walking and Moving Around Current Status (Z6109): 100 percent impaired, limited or restricted Mobility: Walking and Moving Around Goal Status (U0454): At least 1 percent but less than 20 percent impaired, limited or restricted    Time: 1100-1125 PT Time Calculation (min): 25 min   Charges:   PT Evaluation $Initial PT Evaluation Tier I: 1 Procedure      PT G Codes:   Functional Assessment Tool Used: Clinical Judgement Functional Limitation: Mobility: Walking and moving around    [facility] 01/19/2014, 11:53 AM

## 2014-01-19 NOTE — Progress Notes (Signed)
[facility] PROGRESS NOTE  Crystal Stein ZOX:[id number] DOB: Nov 26, 1970 DOA: 01/16/2014 PCP: No PCP Per Patient  Assessment/Plan: 1. Chest pain atypical in setting of recent NSTEMI 01/10/14 and high anxiety regarding family issues. Pain free since yesterday. Troponin levels have been elevated but trending downward and likely related to event 01/10/14. Repeat EKG with no acute changes. CT angio chest negative for PE or any acute abnormality. She was evaluated by cardiology who opine stress induced cardiomyopathy likely causing symptoms. Recommending stress management, smoking cessation as well as BB which was changed to long acting.  2. Anxiety/depression. Significant stress around divorce and custody issues. Continues with LE weakness and inability to walk. Concern that extreme anxiety contributing to physical symptoms. Spoke with father who reports that patient was able to walk into house when discharged from hospital last week but she went to bed and did not get up. She "hollered 24/7 that 'im gonna die, i know im gonna die!'". Await tele psych consult  3. Tobacco abuse. Cessation counseling offered 4. Tachycardia: stable. HR range 99-101. Home medication compliance some question.  5. Acute encephalopathy: resolved. Likely related to medications.  6. Elevated transaminases. Abdominal US probably fatty liver in slightly enlarged liver. Will likely need OP follow up.  7. Metabolic acidosis: resolved.  8. LE weakness: inbility to bear weight/stand. Able to move legs/hips in bed. Straight leg lift. Legs/lumbar spine non-tender. See #2. MRI lumbar spine without disc herniation or stenosis.RPR, HIV antibody, vit B12 negative.  Await physical therapy evaluation. Await neuro consult. Await psych eval 9. Hyperkalemia: related to supplementation. Provided with kayexalate. Will monitor 10. UTI. Urine with gram neg rods. Will start cipro day #1. She is afebrile and non-toxic appear   Code Status:  full Family Communication:  Disposition Plan:  To be determined after neuro eval and psych eval   Consultants:  Neuro  psych  Procedures:  none  Antibiotics:  none  HPI/Subjective: Awake alert. Continues with LE "weak and cant walk".   Objective: Filed Vitals:   01/19/14 1041  BP: 128/96  Pulse: 99  Temp:   Resp:     Intake/Output Summary (Last 24 hours) at 01/19/14 1102 Last data filed at 01/19/14 1022  Gross per 24 hour  Intake 2779.17 ml  Output    860 ml  Net 1919.17 ml   Filed Weights   01/16/14 2228  Weight: 50.848 kg (112 lb 1.6 oz)    Exam:   General:  Awake alert NAD  Cardiovascular: tachycardic but regular. No MGR No LE edema  Respiratory: normal effort BS clear bilaterally no wheeze no rhonchi  Abdomen: flat soft +BS non-tender   Musculoskeletal: LE with good muscle tone. Moving LE spontaneously. Flexion of knee and hip without problem. Able to lift self on and off bedpan  Data Reviewed: Basic Metabolic Panel:  Recent Labs Lab 01/16/14 1615 01/17/14 0327 01/18/14 0533 01/18/14 0756 01/19/14 0602  NA 135* 137 139  --  137  K 3.2* 3.9 3.1*  --  6.2*  CL 97 102 107  --  107  CO2 18* 18* 21  --  20  GLUCOSE 113* 90 92  --  81  BUN 8 6 3*  --  <3*  CREATININE 0.49* 0.58 0.45*  --  0.42*  CALCIUM 8.9 8.3* 7.8*  --  8.2*  MG 1.5  --   --  1.5  --    Liver Function Tests:  Recent Labs Lab 01/17/14 0327 01/18/14 0533 01/19/14 0602  AST 359* 196* 225*  ALT 112* 84* 89*  ALKPHOS 257* 284* 337*  BILITOT 0.9 1.0 1.1  PROT 5.6* 5.4* 5.9*  ALBUMIN 2.1* 2.1* 2.3*   No results found for this basename: LIPASE, AMYLASE,  in the last 168 hours No results found for this basename: AMMONIA,  in the last 168 hours CBC:  Recent Labs Lab 01/16/14 1615 01/17/14 0327  WBC 6.9 7.0  NEUTROABS 3.3  --   HGB 12.9 11.0*  HCT 35.2* 30.8*  MCV 90.3 91.1  PLT 315 288   Cardiac Enzymes:  Recent Labs Lab 01/16/14 1615 01/16/14 2204  01/17/14 0327 01/17/14 0939  TROPONINI 1.14* 0.75* 0.66* 0.74*   BNP (last 3 results)  Recent Labs  01/11/14 0610  PROBNP 259.3*   CBG: No results found for this basename: GLUCAP,  in the last 168 hours  Recent Results (from the past 240 hour(s))  MRSA PCR SCREENING     Status: None   Collection Time    01/10/14  9:35 PM      Result Value Ref Range Status   MRSA by PCR NEGATIVE  NEGATIVE Final   Comment:            The GeneXpert MRSA Assay (FDA     approved for NASAL specimens     only), is one component of a     comprehensive MRSA colonization     surveillance program. It is not     intended to diagnose MRSA     infection nor to guide or     monitor treatment for     MRSA infections.  URINE CULTURE     Status: None   Collection Time    01/18/14  9:25 AM      Result Value Ref Range Status   Specimen Description URINE, CLEAN CATCH   Final   Special Requests NONE   Final   Culture  Setup Time     Final   Value: 01/18/2014 19:44     Performed at [facility] Count     Final   Value: >=100,000 COLONIES/ML     Performed at [facility]   Culture     Final   Value: GRAM NEGATIVE RODS     Performed at [facility]   Report Status PENDING   Incomplete     Studies: Mr Lumbar Spine Wo Contrast  01/18/2014   CLINICAL DATA:  Bilateral lower extremity weakness.  EXAM: MRI LUMBAR SPINE WITHOUT CONTRAST  TECHNIQUE: Multiplanar, multisequence MR imaging of the lumbar spine was performed. No intravenous contrast was administered.  COMPARISON:  CT abdomen and pelvis 08/22/2008  FINDINGS: Vertebral alignment is normal. The L4 and L5 vertebral bodies as well as the visualized upper sacrum appear mildly hypoplastic/reduced in AP diameter, with prominence of the lower lumbar and upper sacral thecal sac. No definite posterior vertebral scalloping is seen. Prominent nerve root sleeves/perineural cysts are noted in the upper sacrum. Vertebral bone marrow  signal is within normal limits. The conus medullaris is normal in signal and terminates at L1. Cauda equina is normal in appearance. There is no disc herniation, spinal canal stenosis, or neural foraminal stenosis. Right renal cyst is again seen.  IMPRESSION: 1. No disc herniation or stenosis. 2. 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   Electronically Signed   By: Sebastian Ache   On: 01/18/2014 21:30   US Abdomen Limited Ruq  01/18/2014  CLINICAL DATA:  Elevated LFTs  EXAM: US ABDOMEN LIMITED - RIGHT UPPER QUADRANT  COMPARISON:  None  FINDINGS: Gallbladder:  Only minimally distended. No definite wall thickening, pericholecystic fluid, stones or sonographic Murphy sign.  Common bile duct:  Diameter: 5 mm diameter common normal  Liver:  Echogenic, likely fatty infiltration, though this can be seen with cirrhosis and certain infiltrative disorders. Question minimally enlarged. No focal hepatic mass or nodularity. Hepatopetal portal venous flow. No intrahepatic biliary dilatation.  No RIGHT upper quadrant ascites identified.  IMPRESSION: Probable fatty infiltration of a minimally enlarged liver as above.  No definite gallstones or biliary obstruction identified.   Electronically Signed   By: Ulyses SouthwardMark  Boles M.D.   On: 01/18/2014 14:28    Scheduled Meds: . ciprofloxacin  250 mg Oral BID  . heparin  5,000 Units Subcutaneous 3 times per day  . isosorbide mononitrate  30 mg Oral Daily  . metoprolol succinate  50 mg Oral Daily  . pantoprazole  40 mg Oral Daily  . sodium polystyrene  30 g Oral Once   Continuous Infusions: . sodium chloride 125 mL/hr at 01/19/14 16100649    Principal Problem:   Chest pain Active Problems:   ANXIETY, CHRONIC   Tobacco abuse   Dyslipidemia   Chest pressure   Stress-induced cardiomyopathy   Acute encephalopathy   Substance abuse   Sinus tachycardia   Elevated transaminase level   Metabolic acidosis   Hypokalemia    Hyperkalemia    Time spent: 35 mintues    [facility] CampusBLACK,KAREN M  [facility] Pager [phone]. If 7PM-7AM, please contact night-coverage at [email or web address], password [facility] 01/19/2014, 11:02 AM  LOS: 3 days

## 2014-01-19 NOTE — Progress Notes (Signed)
Patient performed a -55 for her NIF test and blew a 295 liters  For FEV1 test; will repeat again in the AM

## 2014-01-19 NOTE — Progress Notes (Signed)
Patient ID: Crystal Stein, female   DOB: 1971-06-16, 43 y.o.   MRN: [id number] Chart reviewed-awaiting Neurology consultation to see if there is probable physiologic basis for her mental (memory deficits/thought processing deficits) and physical (lower extremity weakness with c/o fear of falling)  vs psychophysiologic phenomena/conversion?

## 2014-01-19 NOTE — Progress Notes (Signed)
Patient seen and examined. Note reviewed  She continues to pain of lower extremity weakness and inability to stand. She is able to move her legs in bed. MRI of the lumbar spine was unremarkable. She did have some nausea and vomiting after receiving Kayexalate today serum potassium was noted to be elevated after supplementation yesterday. I suspect that her lower extremity weakness may be more functional than actual physiologic process. Neurology has been consulted to see if any further workup is required. Psychiatry has also been consulted and we'll await neurology recommendations before determining further disposition.  MEMON,JEHANZEB

## 2014-01-20 DIAGNOSIS — E876 Hypokalemia: Secondary | ICD-10-CM

## 2014-01-20 DIAGNOSIS — R29898 Other symptoms and signs involving the musculoskeletal system: Secondary | ICD-10-CM | POA: Diagnosis present

## 2014-01-20 LAB — CSF CELL COUNT WITH DIFFERENTIAL
RBC Count, CSF: 640 /mm3 — ABNORMAL HIGH
Tube #: 3
WBC CSF: 0 /mm3 (ref 0–5)

## 2014-01-20 LAB — COMPREHENSIVE METABOLIC PANEL
ALT: 93 U/L — AB (ref 0–35)
AST: 269 U/L — AB (ref 0–37)
Albumin: 1.9 g/dL — ABNORMAL LOW (ref 3.5–5.2)
Alkaline Phosphatase: 310 U/L — ABNORMAL HIGH (ref 39–117)
Anion gap: 10 (ref 5–15)
BUN: <3 mg/dL — ABNORMAL LOW (ref 6–23)
CO2: 22 mEq/L (ref 19–32)
CREATININE: 0.45 mg/dL — AB (ref 0.50–1.10)
Calcium: 7.7 mg/dL — ABNORMAL LOW (ref 8.4–10.5)
Chloride: 105 mEq/L (ref 96–112)
Glucose, Bld: 101 mg/dL — ABNORMAL HIGH (ref 70–99)
Potassium: 3.6 mEq/L — ABNORMAL LOW (ref 3.7–5.3)
SODIUM: 137 meq/L (ref 137–147)
TOTAL PROTEIN: 5.2 g/dL — AB (ref 6.0–8.3)
Total Bilirubin: 1 mg/dL (ref 0.3–1.2)

## 2014-01-20 LAB — URINE CULTURE: Colony Count: >=100000

## 2014-01-20 LAB — CBC
HCT: 30.5 % — ABNORMAL LOW (ref 36.0–46.0)
HEMOGLOBIN: 10.5 g/dL — AB (ref 12.0–15.0)
MCH: 32.5 pg (ref 26.0–34.0)
MCHC: 34.4 g/dL (ref 30.0–36.0)
MCV: 94.4 fL (ref 78.0–100.0)
PLATELETS: 243 10*3/uL (ref 150–400)
RBC: 3.23 MIL/uL — ABNORMAL LOW (ref 3.87–5.11)
RDW: 15.7 % — ABNORMAL HIGH (ref 11.5–15.5)
WBC: 5.5 10*3/uL (ref 4.0–10.5)

## 2014-01-20 LAB — CRYPTOCOCCAL ANTIGEN, CSF: Crypto Ag: NEGATIVE

## 2014-01-20 LAB — PROTEIN AND GLUCOSE, CSF
GLUCOSE CSF: 61 mg/dL (ref 43–76)
Total  Protein, CSF: 9 mg/dL — ABNORMAL LOW (ref 15–45)

## 2014-01-20 MED ORDER — HYDROCODONE-ACETAMINOPHEN 5-325 MG PO TABS
1.0000 | ORAL_TABLET | Freq: Four times a day (QID) | ORAL | Status: DC | PRN
  Administered 2014-01-20 – 2014-01-23 (×10): 1 via ORAL
  Filled 2014-01-20 (×11): qty 1

## 2014-01-20 MED ORDER — POVIDONE-IODINE 10 % EX SOLN
CUTANEOUS | Status: AC
  Filled 2014-01-20: qty 118

## 2014-01-20 MED ORDER — IMMUNE GLOBULIN (HUMAN) 10 GM/100ML IV SOLN
400.0000 mg/kg | INTRAVENOUS | Status: AC
  Administered 2014-01-20 – 2014-01-24 (×5): 20 g via INTRAVENOUS
  Filled 2014-01-20 (×8): qty 200

## 2014-01-20 NOTE — Progress Notes (Signed)
Patient requested that RN notify her father, Gerlene BurdockRichard, that [facility] is [facility].  RN called the number listed for her father; however, there was no answer and voicemail had not been set up.

## 2014-01-20 NOTE — Consult Note (Signed)
  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

## 2014-01-20 NOTE — Progress Notes (Signed)
Report called to Thayer Ohmhris, [facility].

## 2014-01-20 NOTE — Progress Notes (Signed)
Patient performed 2.65L on FVC and -55on NIF. RT will continue to monitor.

## 2014-01-20 NOTE — Progress Notes (Signed)
Patient c/o of pain of 8 of 10 after receiving Tylenol.  Dr. Kerry HoughMemon on unit and notified.  Gave order for patient to receive Hydrocodone 5-325 mg 1 po every 6 hours as needed for pain.

## 2014-01-20 NOTE — Progress Notes (Signed)
RN notified patient that the 613 number did not work.  Patient provided the home number [phone].  RN contacted patient's father and notified him of transfer per patient's request.

## 2014-01-20 NOTE — Progress Notes (Signed)
Late entry:  Patient administered morning medicine.  Patient vomited after taking medications.  Zofran administered.  Dr. Kerry HoughMemon notified.  Patient administered Kayexalate in the afternoon.  Patient tolerated well.  Administered afternoon medications.  Patient vomited up medications.  Dr. Kerry HoughMemon notified.  PRN Zofran administered.  Patient later administered morning medications.  Tolerated well with no emesis.

## 2014-01-20 NOTE — Procedures (Signed)
[facility] Shaye Elling A. Gerilyn Pilgrim, MD     [email or web address]         SPINAL TAP  INDICATION: The patient presents with acute onset of leg weakness and areflexia. The clinical diagnosis Guillain-Barr  syndrome. Symptoms of this being present for about 3 days but she does have significant weakness of the legs and now progressing to the upper extremities.   PROCEDURE: Informed consent was obtained in the usual fashion. The patient is placed in the lying position with the knees flexed and the neck bent. There is marked sufficient landmarks. There is prepped with Betadine. The L3-L4 interspace is marked and anesthetized with lidocaine. Using a 22-gauge needle the area is attempted to be accessed about 3 times but was unsuccessful. She subsequently placed in the sitting position and the L3-L4 interspace was accessed using a 22-gauge needle on a second attempt. Initial CSF fluid is slightly red tense but rapidly cleared. Specimen was sent for routine analysis. The stylette was replaced and the needle removed. Bandages placed. She tolerated the procedure well.

## 2014-01-20 NOTE — Progress Notes (Signed)
[facility] PROGRESS NOTE  Crystal Stein ZOX:[id number] DOB: 01-Nov-1970 DOA: 01/16/2014 PCP: No PCP Per Patient  Summary:  This is a 43 year old female who was recently seen [facility] after suffering a non-ST elevation MI. She had elevated troponin and underwent cardiac catheterization which did not reveal any significant coronary artery disease. It was felt that she likely had a stress induced MI. The patient was discharged home and within 4 days return to [facility]. She complained of recurrent chest pain and had elevated troponin. She was seen by cardiology who felt that her elevated troponin was related to her prior MI and did not indicate a new event. Recommendations were for continued beta blocker and stress management. The patient also described worsening lower extremity weakness and inability to stand/per week. She was able to move her legs in her bed, but was unable to stand. MRI of the lumbar spine did not show any acute abnormalities. She was seen by psychiatry as well as neurology. After being evaluated by neurology, there was concern the patient was developing Guillan Barre syndrome. She underwent lumbar puncture and studies have been sent. She has been started on intravenous immunoglobulin per neurology recommendations. Unfortunately, since we will not have a neurologist available in-house for the next week, I feel she would be better served by transferring to [facility] where she can be followed daily by neurology. I discussed the case with Dr. Joseph Art who has accepted the patient in transfer to the step down unit. She will need close monitoring of the FVC to ensure that she's not developing respiratory failure.  Assessment/Plan: 1. Chest pain atypical in setting of recent NSTEMI 01/10/14 and high anxiety regarding family issues. Pain free since yesterday. Troponin levels have been elevated but trending downward and likely related to event 01/10/14. Repeat EKG  with no acute changes. CT angio chest negative for PE or any acute abnormality. She was evaluated by cardiology who opine stress induced cardiomyopathy likely causing symptoms. Recommending stress management, smoking cessation as well as BB which was changed to long acting.  2. Anxiety/depression. Significant stress around divorce and custody issues. Continued on home dose of Valium. Would benefit from outpatient psychiatry followup 3. Tobacco abuse. Cessation counseling offered 4. Tachycardia: stable. HR range 99-101. Home medication compliance some question. Maybe related to #8. 5. Acute encephalopathy: resolved. Likely related to medications.  6. Elevated transaminases. Abdominal ultrasound showed probable fatty liver without any gallbladder/CBD pathology. Could also be related to myopathy related to #8.  7. Metabolic acidosis: resolved.  8. 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 The patient was areflexic in her lower extremities. MRI of the lumbar spine is unremarkable. She's undergone lumbar puncture today with studies currently in process. She's been started on intravenous immunoglobulin. Since we will not have any neurology cover in-house for the next week, I feel she would be better served by transfer to [facility] where she may be followed daily by a neurologist. FVC is currently 2.6 L. We'll need to monitor every 6 hours. If her FVC falls below 1 L then she would be high risk for respiratory failure and may need intubation. 9. Hypokalemia. Replace 10. UTI. Urine with gram neg rods. She is currently on ciprofloxacin. We'll need to followup sensitivities and cultures. She is afebrile and non-toxic appear   Code Status: full Family Communication: This is a patient Disposition  Plan:  [facility]  [facility].   Consultants:  Neurology  psych  Procedures:  Lumbar puncture done on 01/20/14 by neurology  Antibiotics:  Ciprofloxacin 7/31>>  HPI/Subjective: Denies any new complaints. Still feels weak in her legs. Has numbness in her legs, her palms bilaterally  Objective: Filed Vitals:   01/20/14 1434  BP: 108/74  Pulse: 106  Temp: 98.5 F (36.9 C)  Resp: 16    Intake/Output Summary (Last 24 hours) at 01/20/14 1515 Last data filed at 01/20/14 [id number]  Gross per 24 hour  Intake   1405 ml  Output      0 ml  Net   1405 ml   Filed Weights   01/16/14 2228  Weight: 50.848 kg (112 lb 1.6 oz)    Exam:   General:  Awake alert NAD  Cardiovascular: tachycardic but regular. No MGR No LE edema  Respiratory: normal effort BS clear bilaterally no wheeze no rhonchi  Abdomen: flat soft +BS non-tender   Musculoskeletal: Lower extremities is normal. Strength is 3-4/5 bilaterally. Reflexes are symmetric and areflexic bilaterally. Babinski is downgoing bilaterally. She has decreased sensation to light touch involving her legs, hands and anterior abdominal/chest region  Data Reviewed: Basic Metabolic Panel:  Recent Labs Lab 01/16/14 1615 01/17/14 0327 01/18/14 0533 01/18/14 0756 01/19/14 0602 01/19/14 1745 01/20/14 0630  NA 135* 137 139  --  137  --  137  K 3.2* 3.9 3.1*  --  6.2* 4.8 3.6*  CL 97 102 107  --  107  --  105  CO2 18* 18* 21  --  20  --  22  GLUCOSE 113* 90 92  --  81  --  101*  BUN 8 6 3*  --  <3*  --  <3*  CREATININE 0.49* 0.58 0.45*  --  0.42*  --  0.45*  CALCIUM 8.9 8.3* 7.8*  --  8.2*  --  7.7*  MG 1.5  --   --  1.5  --   --   --    Liver Function Tests:  Recent Labs Lab 01/17/14 0327 01/18/14 0533 01/19/14 0602 01/20/14 0630  AST 359* 196* 225* 269*  ALT 112* 84* 89* 93*  ALKPHOS 257* 284* 337* 310*  BILITOT 0.9 1.0 1.1 1.0  PROT 5.6* 5.4* 5.9* 5.2*  ALBUMIN 2.1* 2.1* 2.3* 1.9*    Recent Labs Lab 01/19/14 1745  LIPASE 44   No  results found for this basename: AMMONIA,  in the last 168 hours CBC:  Recent Labs Lab 01/16/14 1615 01/17/14 0327 01/20/14 0630  WBC 6.9 7.0 5.5  NEUTROABS 3.3  --   --   HGB 12.9 11.0* 10.5*  HCT 35.2* 30.8* 30.5*  MCV 90.3 91.1 94.4  PLT 315 288 243   Cardiac Enzymes:  Recent Labs Lab 01/16/14 1615 01/16/14 2204 01/17/14 0327 01/17/14 0939  TROPONINI 1.14* 0.75* 0.66* 0.74*   BNP (last 3 results)  Recent Labs  01/11/14 0610  PROBNP 259.3*   CBG: No results found for this basename: GLUCAP,  in the last 168 hours  Recent Results (from the past 240 hour(s))  MRSA PCR SCREENING     Status: None   Collection Time    01/10/14  9:35 PM      Result Value Ref Range Status   MRSA by PCR NEGATIVE  NEGATIVE Final   Comment:            The GeneXpert MRSA Assay (FDA  approved for NASAL specimens     only), is one component of a     comprehensive MRSA colonization     surveillance program. It is not     intended to diagnose MRSA     infection nor to guide or     monitor treatment for     MRSA infections.  URINE CULTURE     Status: None   Collection Time    01/18/14  9:25 AM      Result Value Ref Range Status   Specimen Description URINE, CLEAN CATCH   Final   Special Requests NONE   Final   Culture  Setup Time     Final   Value: 01/18/2014 19:44     Performed at [facility] Count     Final   Value: >=100,000 COLONIES/ML     Performed at [facility]   Culture     Final   Value: GRAM NEGATIVE RODS     Performed at [facility]   Report Status PENDING   Incomplete     Studies: Mr Lumbar Spine Wo Contrast  01/18/2014   CLINICAL DATA:  Bilateral lower extremity weakness.  EXAM: MRI LUMBAR SPINE WITHOUT CONTRAST  TECHNIQUE: Multiplanar, multisequence MR imaging of the lumbar spine was performed. No intravenous contrast was administered.  COMPARISON:  CT abdomen and pelvis 08/22/2008  FINDINGS: Vertebral alignment is normal.  The L4 and L5 vertebral bodies as well as the visualized upper sacrum appear mildly hypoplastic/reduced in AP diameter, with prominence of the lower lumbar and upper sacral thecal sac. No definite posterior vertebral scalloping is seen. Prominent nerve root sleeves/perineural cysts are noted in the upper sacrum. Vertebral bone marrow signal is within normal limits. The conus medullaris is normal in signal and terminates at L1. Cauda equina is normal in appearance. There is no disc herniation, spinal canal stenosis, or neural foraminal stenosis. Right renal cyst is again seen.  IMPRESSION: 1. No disc herniation or stenosis. 2. 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   Electronically Signed   By: Sebastian Ache   On: 01/18/2014 21:30    Scheduled Meds: . ciprofloxacin  250 mg Oral BID  . IMMUNE GLOBULIN 10% (HUMAN) IV - For Fluid Restriction Only  400 mg/kg Intravenous Q24H  . isosorbide mononitrate  30 mg Oral Daily  . metoprolol succinate  50 mg Oral Daily  . pantoprazole  40 mg Oral Daily   Continuous Infusions: . sodium chloride 125 mL/hr at 01/20/14 1610    Principal Problem:   Chest pain Active Problems:   ANXIETY, CHRONIC   Tobacco abuse   Dyslipidemia   Chest pressure   Stress-induced cardiomyopathy   Acute encephalopathy   Substance abuse   Sinus tachycardia   Elevated transaminase level   Metabolic acidosis   Hypokalemia   Hyperkalemia   UTI (urinary tract infection)    Time spent: 35 mintues    MEMON,JEHANZEB  [facility] Pager [phone]. If 7PM-7AM, please contact night-coverage at [email or web address], password [facility] 01/20/2014, 3:15 PM  LOS: 4 days

## 2014-01-20 NOTE — Progress Notes (Signed)
Patient ID: CHLOE BLUETT, female   DOB: October 13, 1970, 43 y.o.   MRN: [id number]  [place] A. Merlene Laughter, MD     [email or web address]          MAHATHI POKORNEY is an 43 y.o. female.   Assessment/Plan: 1. Acute leg weakness and numbness of the upper and lower extremities associated with areflexia. 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 Patient to be slight deterioration today with more involvement in the upper extremities. Consequently, the patient will be sent to have a spinal tap. Because the patient can get acutely worse, we'll do a NIF and the FVC. This be done daily to assess for respiratory suppression. If her NIF and FVC decreases especially in the setting of upper extremity weakness, we should elect to have the patient be observed in the ICU and even consider elective intubation. She should remain on telemetry because of the increased risk of tachycardia dysrhythmias. Start immunoglobulins 400 mg/kg daily for 5 days.   Patient is doing about the same. No new complaints are reported.  GENERAL: Thin pleasant female in no acute distress.  HEENT: Supple. Atraumatic normocephalic. Edentulous.  ABDOMEN: soft  EXTREMITIES: No edema  BACK: Normal.  SKIN: A few ecchymotic spots of the upper and lower extremities. There is a larger spots involving the right groin status post cardiac catheterization.  MENTAL STATUS: Alert and oriented. Speech, language and cognition are generally intact. Judgment and insight normal.  CRANIAL NERVES: Pupils are equal, round and reactive to light and accommodation; extra ocular movements are full, there is no significant nystagmus; visual fields are full; upper and lower facial muscles are normal in strength and symmetric, there is no flattening of the nasolabial folds; tongue is midline; uvula is midline; shoulder elevation is normal.  MOTOR: Bulk and tone are normal  throughout. Deltoid 4-/5; hand grip 5; hip flexion 2-3/5 and dorsiflexion 4+/5.  COORDINATION: Left finger to nose is normal, right finger to nose is normal, No rest tremor; no intention tremor; no postural tremor; no bradykinesia.  REFLEXES: Deep tendon reflexes are symmetrical But totally areflexic in the legs even with augmentation. There are normal in the upper extremities. Babinski reflexes are flexor bilaterally.  SENSATION: Markedly diminished sensation to light his symptoms involving the legs, hands and anterior abdominal and chest region but sparing of the back and neck.The pattern is most consistent with radicular sensory loss. There is no sensory level.     Objective: Vital signs in last 24 hours: Temp:  [98 F (36.7 C)-98.5 F (36.9 C)] 98.1 F (36.7 C) (08/01 0502) Pulse Rate:  [104-110] 107 (08/01 0502) Resp:  [18-20] 18 (08/01 0502) BP: (122-136)/(91-99) 122/94 mmHg (08/01 0502) SpO2:  [100 %] 100 % (08/01 0502)  Intake/Output from previous day: 07/31 0701 - 08/01 0700 In: 1405 [I.V.:1405] Out: 360 [Urine:200; Emesis/NG output:160] Intake/Output this shift:   Nutritional status: Cardiac   Lab Results: Results for orders placed during the hospital encounter of 01/16/14 (from the past 48 hour(s))  TSH     Status: Abnormal   Collection Time    01/18/14  5:38 PM      Result Value Ref Range   TSH 6.820 (*) 0.350 - 4.500 uIU/mL   Comment: Performed at [facility]  RPR     Status: None   Collection Time    01/18/14  5:38 PM      Result Value Ref Range   RPR NON REAC  NON  REAC   Comment: Performed at [place]     Status: None   Collection Time    01/18/14  5:38 PM      Result Value Ref Range   Vitamin B-12 812  211 - 911 pg/mL   Comment: Performed at [place] (ROUTINE TESTING)     Status: None   Collection Time    01/18/14  5:38 PM      Result Value Ref Range   HIV 1&2 Ab, 4th Generation NONREACTIVE   NONREACTIVE   Comment: (NOTE)     A NONREACTIVE HIV Ag/Ab result does not exclude HIV infection since     the time frame for seroconversion is variable. If acute HIV infection     is suspected, a HIV-1 RNA Qualitative TMA test is recommended.     HIV-1/2 Antibody Diff         Not indicated.     HIV-1 RNA, Qual TMA           Not indicated.     PLEASE NOTE: This information has been disclosed to you from records     whose confidentiality may be protected by state law. If your state     requires such protection, then the state law prohibits you from making     any further disclosure of the information without the specific written     consent of the person to whom it pertains, or as otherwise permitted     by law. A general authorization for the release of medical or other     information is NOT sufficient for this purpose.     The performance of this assay has not been clinically validated in     patients less than 63 years old.     Performed at [place]     Status: Abnormal   Collection Time    01/19/14  6:02 AM      Result Value Ref Range   Sodium 137  137 - 147 mEq/L   Potassium 6.2 (*) 3.7 - 5.3 mEq/L   Comment: DELTA CHECK NOTED   Chloride 107  96 - 112 mEq/L   CO2 20  19 - 32 mEq/L   Glucose, Bld 81  70 - 99 mg/dL   BUN <3 (*) 6 - 23 mg/dL   Creatinine, Ser 0.42 (*) 0.50 - 1.10 mg/dL   Calcium 8.2 (*) 8.4 - 10.5 mg/dL   Total Protein 5.9 (*) 6.0 - 8.3 g/dL   Albumin 2.3 (*) 3.5 - 5.2 g/dL   AST 225 (*) 0 - 37 U/L   ALT 89 (*) 0 - 35 U/L   Alkaline Phosphatase 337 (*) 39 - 117 U/L   Total Bilirubin 1.1  0.3 - 1.2 mg/dL   GFR calc non Af Amer >90  >90 mL/min   GFR calc Af Amer >90  >90 mL/min   Comment: (NOTE)     The eGFR has been calculated using the CKD EPI equation.     This calculation has not been validated in all clinical situations.     eGFR's persistently <90 mL/min signify possible Chronic Kidney     Disease.   Anion gap  10  5 - 15  POTASSIUM     Status: None   Collection Time    01/19/14  5:45 PM      Result Value Ref Range   Potassium 4.8  3.7 -  5.3 mEq/L   Comment: DELTA CHECK NOTED  LIPASE, BLOOD     Status: None   Collection Time    01/19/14  5:45 PM      Result Value Ref Range   Lipase 44  11 - 59 U/L  COMPREHENSIVE METABOLIC PANEL     Status: Abnormal   Collection Time    01/20/14  6:30 AM      Result Value Ref Range   Sodium 137  137 - 147 mEq/L   Potassium 3.6 (*) 3.7 - 5.3 mEq/L   Comment: DELTA CHECK NOTED   Chloride 105  96 - 112 mEq/L   CO2 22  19 - 32 mEq/L   Glucose, Bld 101 (*) 70 - 99 mg/dL   BUN <3 (*) 6 - 23 mg/dL   Creatinine, Ser 0.45 (*) 0.50 - 1.10 mg/dL   Calcium 7.7 (*) 8.4 - 10.5 mg/dL   Total Protein 5.2 (*) 6.0 - 8.3 g/dL   Albumin 1.9 (*) 3.5 - 5.2 g/dL   AST 269 (*) 0 - 37 U/L   ALT 93 (*) 0 - 35 U/L   Alkaline Phosphatase 310 (*) 39 - 117 U/L   Total Bilirubin 1.0  0.3 - 1.2 mg/dL   GFR calc non Af Amer >90  >90 mL/min   GFR calc Af Amer >90  >90 mL/min   Comment: (NOTE)     The eGFR has been calculated using the CKD EPI equation.     This calculation has not been validated in all clinical situations.     eGFR's persistently <90 mL/min signify possible Chronic Kidney     Disease.   Anion gap 10  5 - 15  CBC     Status: Abnormal   Collection Time    01/20/14  6:30 AM      Result Value Ref Range   WBC 5.5  4.0 - 10.5 K/uL   RBC 3.23 (*) 3.87 - 5.11 MIL/uL   Hemoglobin 10.5 (*) 12.0 - 15.0 g/dL   HCT 30.5 (*) 36.0 - 46.0 %   MCV 94.4  78.0 - 100.0 fL   MCH 32.5  26.0 - 34.0 pg   MCHC 34.4  30.0 - 36.0 g/dL   RDW 15.7 (*) 11.5 - 15.5 %   Platelets 243  150 - 400 K/uL    Lipid Panel No results found for this basename: CHOL, TRIG, HDL, CHOLHDL, VLDL, LDLCALC,  in the last 72 hours  Studies/Results: Mr Lumbar Spine Wo Contrast  01/18/2014   CLINICAL DATA:  Bilateral lower extremity weakness.  EXAM: MRI LUMBAR SPINE WITHOUT CONTRAST  TECHNIQUE:  Multiplanar, multisequence MR imaging of the lumbar spine was performed. No intravenous contrast was administered.  COMPARISON:  CT abdomen and pelvis 08/22/2008  FINDINGS: Vertebral alignment is normal. The L4 and L5 vertebral bodies as well as the visualized upper sacrum appear mildly hypoplastic/reduced in AP diameter, with prominence of the lower lumbar and upper sacral thecal sac. No definite posterior vertebral scalloping is seen. Prominent nerve root sleeves/perineural cysts are noted in the upper sacrum. Vertebral bone marrow signal is within normal limits. The conus medullaris is normal in signal and terminates at L1. Cauda equina is normal in appearance. There is no disc herniation, spinal canal stenosis, or neural foraminal stenosis. Right renal cyst is again seen.  IMPRESSION: 1. No disc herniation or stenosis. 2. 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   Electronically Signed   By:  Logan Bores   On: 01/18/2014 21:30   US Abdomen Limited Ruq  01/18/2014   CLINICAL DATA:  Elevated LFTs  EXAM: US ABDOMEN LIMITED - RIGHT UPPER QUADRANT  COMPARISON:  None  FINDINGS: Gallbladder:  Only minimally distended. No definite wall thickening, pericholecystic fluid, stones or sonographic Murphy sign.  Common bile duct:  Diameter: 5 mm diameter common normal  Liver:  Echogenic, likely fatty infiltration, though this can be seen with cirrhosis and certain infiltrative disorders. Question minimally enlarged. No focal hepatic mass or nodularity. Hepatopetal portal venous flow. No intrahepatic biliary dilatation.  No RIGHT upper quadrant ascites identified.  IMPRESSION: Probable fatty infiltration of a minimally enlarged liver as above.  No definite gallstones or biliary obstruction identified.   Electronically Signed   By: Lavonia Dana M.D.   On: 01/18/2014 14:28    Medications:  Scheduled Meds: . ciprofloxacin  250 mg Oral BID  . isosorbide mononitrate  30 mg  Oral Daily  . metoprolol succinate  50 mg Oral Daily  . pantoprazole  40 mg Oral Daily  . povidone-iodine       Continuous Infusions: . sodium chloride 125 mL/hr at 01/20/14 0652   PRN Meds:.acetaminophen, diazepam, nitroGLYCERIN, ondansetron (ZOFRAN) IV, ondansetron     LOS: 4 days   Taleeya Blondin A. Merlene Laughter, M.D.  Diplomate, Tax adviser of Psychiatry and Neurology ( Neurology).

## 2014-01-21 ENCOUNTER — Inpatient Hospital Stay (HOSPITAL_COMMUNITY): Payer: Medicaid Other

## 2014-01-21 DIAGNOSIS — R7402 Elevation of levels of lactic acid dehydrogenase (LDH): Secondary | ICD-10-CM

## 2014-01-21 DIAGNOSIS — E0789 Other specified disorders of thyroid: Secondary | ICD-10-CM

## 2014-01-21 DIAGNOSIS — F3289 Other specified depressive episodes: Secondary | ICD-10-CM

## 2014-01-21 DIAGNOSIS — N390 Urinary tract infection, site not specified: Secondary | ICD-10-CM

## 2014-01-21 DIAGNOSIS — F329 Major depressive disorder, single episode, unspecified: Secondary | ICD-10-CM

## 2014-01-21 DIAGNOSIS — E038 Other specified hypothyroidism: Secondary | ICD-10-CM

## 2014-01-21 DIAGNOSIS — G610 Guillain-Barre syndrome: Secondary | ICD-10-CM | POA: Diagnosis present

## 2014-01-21 DIAGNOSIS — R740 Nonspecific elevation of levels of transaminase and lactic acid dehydrogenase [LDH]: Secondary | ICD-10-CM

## 2014-01-21 DIAGNOSIS — T83511A Infection and inflammatory reaction due to indwelling urethral catheter, initial encounter: Secondary | ICD-10-CM

## 2014-01-21 DIAGNOSIS — R209 Unspecified disturbances of skin sensation: Secondary | ICD-10-CM

## 2014-01-21 DIAGNOSIS — G8250 Quadriplegia, unspecified: Secondary | ICD-10-CM | POA: Diagnosis present

## 2014-01-21 DIAGNOSIS — F411 Generalized anxiety disorder: Secondary | ICD-10-CM

## 2014-01-21 DIAGNOSIS — G9340 Encephalopathy, unspecified: Secondary | ICD-10-CM

## 2014-01-21 LAB — BASIC METABOLIC PANEL
Anion gap: 11 (ref 5–15)
BUN: 3 mg/dL — AB (ref 6–23)
CALCIUM: 8.5 mg/dL (ref 8.4–10.5)
CO2: 24 mEq/L (ref 19–32)
CREATININE: 0.48 mg/dL — AB (ref 0.50–1.10)
Chloride: 100 mEq/L (ref 96–112)
GLUCOSE: 92 mg/dL (ref 70–99)
Potassium: 4 mEq/L (ref 3.7–5.3)
Sodium: 135 mEq/L — ABNORMAL LOW (ref 137–147)

## 2014-01-21 LAB — T4, FREE: FREE T4: 0.79 ng/dL — AB (ref 0.80–1.80)

## 2014-01-21 MED ORDER — HEPARIN SODIUM (PORCINE) 5000 UNIT/ML IJ SOLN
5000.0000 [IU] | Freq: Three times a day (TID) | INTRAMUSCULAR | Status: DC
  Administered 2014-01-21 – 2014-02-01 (×33): 5000 [IU] via SUBCUTANEOUS
  Filled 2014-01-21 (×37): qty 1

## 2014-01-21 MED ORDER — GADOBENATE DIMEGLUMINE 529 MG/ML IV SOLN
10.0000 mL | Freq: Once | INTRAVENOUS | Status: AC | PRN
  Administered 2014-01-21: 10 mL via INTRAVENOUS

## 2014-01-21 MED ORDER — LEVOTHYROXINE SODIUM 25 MCG PO TABS
25.0000 ug | ORAL_TABLET | Freq: Every day | ORAL | Status: DC
  Administered 2014-01-21 – 2014-02-01 (×12): 25 ug via ORAL
  Filled 2014-01-21 (×13): qty 1

## 2014-01-21 MED ORDER — CETYLPYRIDINIUM CHLORIDE 0.05 % MT LIQD
7.0000 mL | Freq: Two times a day (BID) | OROMUCOSAL | Status: DC
  Administered 2014-01-21 – 2014-02-01 (×21): 7 mL via OROMUCOSAL

## 2014-01-21 NOTE — Progress Notes (Signed)
FVC 1.43L  NIF -50 cm.   Good effort

## 2014-01-21 NOTE — Consult Note (Signed)
Reason for Consult:GB Referring Physician: Joseph Art  CC: Quadriparesis  HPI: Crystal Stein is an 43 y.o. female  who was recently hospitalized for chest pain and diagnosed with a non-Elevated ST segmentMI.  On 01/18/2014 she developed numbness and weakness of the legs. It seems that the symptoms started in her feet and ascended.  She is unclear about exactly when her symptoms started because she was focusing on her chest pain.  She also reported numbness of the hands. She now reports numbness to above the breast line and involving both upper extremities.  She has not had a bowel movement.  She is not incontinent of urine.  She is not having any difficulty with breathing or swallowing.   Patient was initially seen by Dr. Gerilyn Pilgrim at [facility].  She was diagnosed with GB and given her first dose of IVIg on yesterday.  She is not improved today.  MRI was performed of the lumbar spine that was unremarkable.  LP showed low protein, normal glucose.  No wbc's were noted.     Past Medical History  Diagnosis Date  . Anxiety   . Depression   . Asthma   . 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 COPD (chronic obstructive pulmonary disease)   . GERD (gastroesophageal reflux disease)   . Bipolar disorder   . Blood transfusion 2006  . History of cardiac catheterization July 2015    Angiographically normal coronary arteries with distal tortuosity  . NSTEMI (non-ST elevated myocardial infarction)     Not secondary to obstructive CAD or obvious plaque rupture, possibly vasospasm    Past Surgical History  Procedure Laterality Date  . Tennis elbow release  04/27/06    Left; [facility]o [facility], [facility]  . Multiple tooth extractions  10/07    Full upper  . Orif proximal humerus fracture  08/06    Left,  . Dilation and curettage of uterus  June 2012    Failed ablation  . Tubal ligation  June 2012    [facility]  . Bipolar disorder    . Hysteroscopy  01/27/2011    Procedure: HYSTEROSCOPY;  Surgeon: Tilda Burrow, MD;  Location: [facility];  Service: Gynecology;  Laterality: N/A;  . Vaginal hysterectomy  05/19/2011    Procedure: HYSTERECTOMY VAGINAL;  Surgeon: Tilda Burrow, MD;  Location: [facility];  Service: Gynecology;  Laterality: N/A;  . Orif wrist fracture  06/17/2012    Procedure: OPEN REDUCTION INTERNAL FIXATION (ORIF) WRIST FRACTURE;  Surgeon: Vickki Hearing, MD;  Location: [facility];  Service: Orthopedics;  Laterality: Left;  . Carpal tunnel release  06/17/2012    Procedure: CARPAL TUNNEL RELEASE;  Surgeon: Vickki Hearing, MD;  Location: [facility];  Service: Orthopedics;  Laterality: Left;    Family History  Problem Relation Age of Onset  . Anesthesia problems Neg Hx   . Hypotension Neg Hx   . Malignant hyperthermia Neg Hx   . Pseudochol deficiency Neg Hx   . Diabetes      Social History:  reports that she has been smoking Cigarettes.  She has a 22 pack-year smoking history. She does not have any smokeless tobacco history on file. She reports that she does not drink alcohol or use illicit drugs.  No Known Allergies  Medications:  I have reviewed the patient's current medications. Prior to Admission:  Prescriptions prior to admission  Medication Sig Dispense Refill  . acetaminophen (TYLENOL) 500 MG tablet Take 500 mg by mouth every  6 (six) hours as needed for mild pain or moderate pain.      . isosorbide mononitrate (IMDUR) 30 MG 24 hr tablet Take 1 tablet (30 mg total) by mouth daily.  30 tablet  5  . metoprolol tartrate (LOPRESSOR) 25 MG tablet Take 1 tablet (25 mg total) by mouth 2 (two) times daily.  60 tablet  5  . nitroGLYCERIN (NITROSTAT) 0.4 MG SL tablet Place 1 tablet (0.4 mg total) under the tongue every 5 (five) minutes x 3 doses as needed for chest pain.  25 tablet  12  . omeprazole (PRILOSEC) 20 MG capsule Take 20 mg by mouth daily.         Scheduled: . ciprofloxacin  250 mg Oral BID  . IMMUNE GLOBULIN 10% (HUMAN) IV - For Fluid Restriction Only  400 mg/kg  Intravenous Q24H  . isosorbide mononitrate  30 mg Oral Daily  . metoprolol succinate  50 mg Oral Daily  . pantoprazole  40 mg Oral Daily    ROS: History obtained from the patient  General ROS: negative for - chills, fatigue, fever, night sweats, weight gain or weight loss Psychological ROS: negative for - behavioral disorder, hallucinations, memory difficulties, mood swings or suicidal ideation Ophthalmic ROS: negative for - blurry vision, double vision, eye pain or loss of vision ENT ROS: negative for - epistaxis, nasal discharge, oral lesions, sore throat, tinnitus or vertigo Allergy and Immunology ROS: negative for - hives or itchy/watery eyes Hematological and Lymphatic ROS: negative for - bleeding problems, bruising or swollen lymph nodes Endocrine ROS: negative for - galactorrhea, hair pattern changes, polydipsia/polyuria or temperature intolerance Respiratory ROS: negative for - cough, hemoptysis, shortness of breath or wheezing Cardiovascular ROS: negative for - chest pain, dyspnea on exertion, edema or irregular heartbeat Gastrointestinal ROS: as noted in HPI Genito-Urinary ROS: as noted in HPI Musculoskeletal ROS: pain in the lower extremities Neurological ROS: as noted in HPI Dermatological ROS: negative for rash and skin lesion changes  Physical Examination: Blood pressure 114/85, pulse 117, temperature 98.2 F (36.8 C), temperature source Oral, resp. rate 39, height 5' 4.5" (1.638 m), weight 50.4 kg (111 lb 1.8 oz), last menstrual period 04/23/2011, SpO2 97.00%.  Neurologic Examination Mental Status: Alert, oriented, thought content appropriate.  Speech fluent without evidence of aphasia.  Able to follow 3 step commands without difficulty. Cranial Nerves: II: Discs flat bilaterally; Visual fields grossly normal, pupils equal, round, reactive to light and accommodation III,IV, VI: ptosis not present, extra-ocular motions intact bilaterally V,VII: smile symmetric, facial  light touch sensation normal bilaterally VIII: hearing normal bilaterally IX,X: gag reflex present XI: bilateral shoulder shrug XII: midline tongue extension Motor: Right : Upper extremity   4/5    Left:     Upper extremity   4/5  Lower extremity   2/5     Lower extremity   2/5 Tone and bulk:normal tone throughout; no atrophy noted Sensory: Pinprick and light touch decreased below C4-5 dermatomal level.  Temperature sensation intact Deep Tendon Reflexes: 2+ in the upper extremities, absent in the lower extremities Plantars: Right: downgoing   Left: downgoing Cerebellar: Normal finger-to-nose testing in the upper extremities.  Heel-to-shin testing unable to be performed Gait: Unable to test due to weakness CV: pulses palpable throughout     Laboratory Studies:   Basic Metabolic Panel:  Recent Labs Lab 01/16/14 1615 01/17/14 0327 01/18/14 0533 01/18/14 0756 01/19/14 0602 01/19/14 1745 01/20/14 0630 01/21/14 0230  NA 135* 137 139  --  137  --  137 135*  K 3.2* 3.9 3.1*  --  6.2* 4.8 3.6* 4.0  CL 97 102 107  --  107  --  105 100  CO2 18* 18* 21  --  20  --  22 24  GLUCOSE 113* 90 92  --  81  --  101* 92  BUN 8 6 3*  --  <3*  --  <3* 3*  CREATININE 0.49* 0.58 0.45*  --  0.42*  --  0.45* 0.48*  CALCIUM 8.9 8.3* 7.8*  --  8.2*  --  7.7* 8.5  MG 1.5  --   --  1.5  --   --   --   --     Liver Function Tests:  Recent Labs Lab 01/17/14 0327 01/18/14 0533 01/19/14 0602 01/20/14 0630  AST 359* 196* 225* 269*  ALT 112* 84* 89* 93*  ALKPHOS 257* 284* 337* 310*  BILITOT 0.9 1.0 1.1 1.0  PROT 5.6* 5.4* 5.9* 5.2*  ALBUMIN 2.1* 2.1* 2.3* 1.9*    Recent Labs Lab 01/19/14 1745  LIPASE 44   No results found for this basename: AMMONIA,  in the last 168 hours  CBC:  Recent Labs Lab 01/16/14 1615 01/17/14 0327 01/20/14 0630  WBC 6.9 7.0 5.5  NEUTROABS 3.3  --   --   HGB 12.9 11.0* 10.5*  HCT 35.2* 30.8* 30.5*  MCV 90.3 91.1 94.4  PLT 315 288 243    Cardiac  Enzymes:  Recent Labs Lab 01/16/14 1615 01/16/14 2204 01/17/14 0327 01/17/14 0939  TROPONINI 1.14* 0.75* 0.66* 0.74*    BNP: No components found with this basename: POCBNP,   CBG: No results found for this basename: GLUCAP,  in the last 168 hours  Microbiology: Results for orders placed during the hospital encounter of 01/16/14  URINE CULTURE     Status: None   Collection Time    01/18/14  9:25 AM      Result Value Ref Range Status   Specimen Description URINE, CLEAN CATCH   Final   Special Requests NONE   Final   Culture  Setup Time     Final   Value: 01/18/2014 19:44     [facility]formed [facility]   Colony Count     Final   Value: >=100,000 COLONIES/ML     [facility]formed at [facility]   Culture     Final   Value: KLEBSIELLA PNEUMONIAE     Performed at [facility]   Report Status 01/20/2014 FINAL   Final   Organism ID, Bacteria KLEBSIELLA PNEUMONIAE   Final  CSF CULTURE     Status: None   Collection Time    01/20/14  1:57 PM      Result Value Ref Range Status   Specimen Description CSF   Final   Special Requests Normal   Final   Gram Stain     Final   Value: CYTOSPIN NO WBC SEEN     NO ORGANISMS SEEN     Performed at [facility]   Culture PENDING   Incomplete   Report Status PENDING   Incomplete    Coagulation Studies: No results found for this basename: LABPROT, INR,  in the last 72 hours  Urinalysis:  Recent Labs Lab 01/18/14 0010  COLORURINE YELLOW  LABSPEC 1.010  PHURINE 6.5  GLUCOSEU NEGATIVE  HGBUR NEGATIVE  BILIRUBINUR NEGATIVE  KETONESUR NEGATIVE  PROTEINUR NEGATIVE  UROBILINOGEN 0.2  NITRITE POSITIVE*  LEUKOCYTESUR NEGATIVE  Lipid Panel:     Component Value Date/Time   CHOL 147 01/11/2014 0610   TRIG 171* 01/11/2014 0610   HDL 29* 01/11/2014 0610   CHOLHDL 5.1 01/11/2014 0610   VLDL 34 01/11/2014 0610   LDLCALC 84 01/11/2014 0610    HgbA1C:  No results found for this basename: HGBA1C    Urine Drug  Screen:     Component Value Date/Time   LABOPIA NONE DETECTED 01/16/2014 2014   COCAINSCRNUR NONE DETECTED 01/16/2014 2014   LABBENZ POSITIVE* 01/16/2014 2014   AMPHETMU NONE DETECTED 01/16/2014 2014   THCU POSITIVE* 01/16/2014 2014   LABBARB NONE DETECTED 01/16/2014 2014    Alcohol Level: No results found for this basename: ETH,  in the last 168 hours  Other results: EKG: sinus tachycardia at 111 bpm.  Imaging: No results found.   Assessment/Plan: 43 year old female diagnosed with GB on IVIg.  Cervical sensory level on examination.  LP non-diagnostic.  Will continue IVIg.  Further imaging recommended along with monitoring of respiratory function.  TSH elevated.  Recommendations: 1.  Address thyroid disease.  Will check antibodies and full panel as well. 2.  Continue IVig for 4 more doses at 400mg /kg 3.  MRI of the cervical spine with and without contrast 4.  NIF and VC q shift 5.  Post void bladder scan 6.  Myasthenia panel    Thana Farr, MD [facility] [phone] 01/21/2014, 10:56 AM

## 2014-01-21 NOTE — Progress Notes (Signed)
[place] TEAM 1 - Stepdown/ICU TEAM Progress Note  Crystal Stein WUJ:[id number] DOB: [birth year] DOA: 01/16/2014 PCP: No PCP Per Patient  Admit HPI / Brief Narrative: 43 year old WF PMHx Anxiety, substance abuse, tobacco abuse, stress induced cardiomyopathy, HLD, recently seen at [facility] after suffering a non-ST elevation MI. She had elevated troponin and underwent cardiac catheterization which did not reveal any significant coronary artery disease. It was felt that she likely had a stress induced MI. The patient was discharged home and within 4 days return to [facility]. She complained of recurrent chest pain and had elevated troponin. She was seen by cardiology who felt that her elevated troponin was related to her prior MI and did not indicate a new event. Recommendations were for continued beta blocker and stress management. The patient also described worsening lower extremity weakness and inability to stand/per week. She was able to move her legs in her bed, but was unable to stand. MRI of the lumbar spine did not show any acute abnormalities. She was seen by psychiatry as well as neurology. After being evaluated by neurology, there was concern the patient was developing Guillan Barre syndrome. She underwent lumbar puncture and studies have been sent. She has been started on intravenous immunoglobulin per neurology recommendations. Unfortunately, since we will not have a neurologist available in-house for the next week, I feel she would be better served by transferring to [facility] where she can be followed daily by neurology. I discussed the case with Dr. Joseph Art who has accepted the patient in transfer to the step down unit. She will need close monitoring of the FVC to ensure that she's not developing respiratory failure   HPI/Subjective: 8/2 A./O. x4, mildly confused does not recall conversation from neurologist at any pain nor speaking with Dr. Thana Farr  (neurologist) this a.m. States only drugs that she abuses is benzodiazepine and marijuana, negative IV drugs.  Assessment/Plan: Subacute hypothyroidism -Free T4 pending -Thyroid antibodies pending -Start Synthroid 25 mcg daily  Guillain Barre syndrome? -Continue IV Ig per neurology -Continue NIF (goal -20 to -25 cmH2O) and VC (goal10-7ml/kg of ideal body weight)   per shift; 8/2 NIF=-50cm, FVC= 1.43L, previous FVC 2.6 L; if FVC<1 L consider intubation -Myasthenia panel pending -CSF preliminary findings nondiagnostic -Urinary retention; place Foley  Acute encephalopathy -Resolving patient still mildly confused.  Elevated transaminase -Secondary to myopathy? -Abdominal ultrasound nondiagnostic see results below  Anxiety/depression.  -Significant stress around divorce and custody issues. Continue Valium 5 mg QID PRN -Psychiatry appointment at discharge  UTI -Continue ciprofloxacin      Code Status: FULL Family Communication: no family present at time of exam Disposition Plan: Resolution of lower extremity paresis    Consultants: Dr. Thana Farr (neurologist)    Procedure/Significant Events: 7/28 UDS; positive marijuana/benzodiazepine 7/28 PCXR;Old right rib fractures are present. 7/28 CT angiogram chest PE protocol;No pulmonary emboli or other acute abnormalities of the chest.  -New hepatomegaly and hepatic steatosis. 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 Normal MRI appearance of the cervical/ upper thoracic spinal cord without pathologic enhancement.     Culture  7/30 urine positive Klebsiella pneumoniae 8/1 CSF  Cryptococcal antigen negative  VDRL  pending  Culture pending    Antibiotics: Ciprofloxacin 7/31>> IVIG 8/1>>  DVT prophylaxis: Heparin per pharmacy  Devices NA   LINES / TUBES:  8/2 22ga left arm    Continuous Infusions: . sodium chloride 125 mL/hr at 01/21/14 0050    Objective: VITAL SIGNS: Temp: 98 F (36.7 C) (08/02 1519) Temp src: Oral (08/02 1519) BP: 126/85 mmHg (08/02 1519) Pulse Rate: 111 (08/02 1148) SPO2; 98% on room air FIO2:   Intake/Output Summary (Last 24 hours) at 01/21/14 1626 Last data filed at 01/21/14 0501  Gross per 24 hour  Intake    521 ml  Output    450 ml  Net     71 ml     Exam: General: A./O. x4, NAD, No acute respiratory distress Lungs: Clear to auscultation bilaterally without wheezes or crackles Cardiovascular: Tachycardic, Regular rhythm without murmur gallop or rub normal S1 and S2 Abdomen: Nontender, nondistended, soft, bowel sounds positive, no rebound, no ascites, no appreciable mass Extremities: No significant cyanosis, clubbing, or edema bilateral lower extremities Neurologic; pupils equal round reactive to light and accommodation, cranial nerves II through XII intact, tongue/uvula midline, symmetric smile, right upper extremity strength 5/5, left upper extremity strength 4/5, bilateral lower extremity strength 3/5, bilateral lower extremity sensation to soft touch/pinprick diminished to mid thigh, absent bilateral knee DTR. Did not attempt to ambulate patient  Data Reviewed: Basic Metabolic Panel:  Recent Labs Lab 01/16/14 1615 01/17/14 0327 01/18/14 0533 01/18/14 0756 01/19/14 0602 01/19/14 1745 01/20/14 0630 01/21/14 0230  NA 135* 137 139  --  137  --  137 135*  K 3.2* 3.9 3.1*  --  6.2* 4.8 3.6* 4.0  CL 97 102 107  --  107  --  105 100  CO2 18* 18* 21  --  20  --  22 24  GLUCOSE 113* 90 92  --  81  --  101* 92  BUN 8 6 3*  --  <3*  --  <3* 3*  CREATININE 0.49* 0.58 0.45*  --  0.42*  --  0.45* 0.48*  CALCIUM 8.9 8.3* 7.8*  --  8.2*  --   7.7* 8.5  MG 1.5  --   --  1.5  --   --   --   --    Liver Function Tests:  Recent Labs Lab 01/17/14 0327 01/18/14 0533 01/19/14 0602 01/20/14 0630  AST 359* 196* 225* 269*  ALT 112* 84* 89* 93*  ALKPHOS 257* 284* 337* 310*  BILITOT 0.9 1.0 1.1 1.0  PROT 5.6* 5.4* 5.9* 5.2*  ALBUMIN 2.1* 2.1* 2.3* 1.9*    Recent Labs Lab 01/19/14 1745  LIPASE 44   No results found for this basename: AMMONIA,  in the last 168 hours CBC:  Recent Labs Lab 01/16/14 1615 01/17/14 0327 01/20/14 0630  WBC 6.9 7.0 5.5  NEUTROABS 3.3  --   --   HGB 12.9 11.0* 10.5*  HCT 35.2* 30.8* 30.5*  MCV 90.3 91.1 94.4  PLT 315 288 243   Cardiac Enzymes:  Recent Labs Lab 01/16/14 1615 01/16/14 2204 01/17/14 0327 01/17/14 0939  TROPONINI 1.14* 0.75* 0.66* 0.74*   BNP (last 3 results)  Recent Labs  01/11/14 0610  PROBNP 259.3*   CBG: No results found for this basename: GLUCAP,  in the last 168 hours  Recent Results (from the past 240 hour(s))  URINE CULTURE     Status: None   Collection Time    01/18/14  9:25 AM      Result Value Ref Range Status   Specimen Description  URINE, CLEAN CATCH   Final   Special Requests NONE   Final   Culture  Setup Time     Final   Value: 01/18/2014 19:44     Performed at [facility] Count     Final   Value: >=100,000 COLONIES/ML     Performed at [facility]   Culture     Final   Value: KLEBSIELLA PNEUMONIAE     Performed at [facility]   Report Status 01/20/2014 FINAL   Final   Organism ID, Bacteria KLEBSIELLA PNEUMONIAE   Final  CSF CULTURE     Status: None   Collection Time    01/20/14  1:57 PM      Result Value Ref Range Status   Specimen Description CSF   Final   Special Requests Normal   Final   Gram Stain     Final   Value: CYTOSPIN NO WBC SEEN     NO ORGANISMS SEEN     Performed at [facility]   Culture     Final   Value: NO GROWTH 1 DAY     Performed at [facility]   Report  Status PENDING   Incomplete     Studies:  Recent x-ray studies have been reviewed in detail by the Attending Physician  Scheduled Meds:  Scheduled Meds: . ciprofloxacin  250 mg Oral BID  . IMMUNE GLOBULIN 10% (HUMAN) IV - For Fluid Restriction Only  400 mg/kg Intravenous Q24H  . isosorbide mononitrate  30 mg Oral Daily  . metoprolol succinate  50 mg Oral Daily  . pantoprazole  40 mg Oral Daily    Time spent on care of this patient: 40 mins   Drema Dallas , MD   [facility] Office  [phone] Pager [phone]  On-Call/Text Page:      [email or web address]      password [email or web address]  If 7PM-7AM, please contact night-coverage [email or web address] Password [email or web address] 01/21/2014, 4:26 PM   LOS: 5 days

## 2014-01-21 NOTE — Progress Notes (Signed)
Patient performed NIF -42 and FVC 2.1 L with good effort

## 2014-01-21 NOTE — Progress Notes (Signed)
VC 1.4L  NIF -50cm H20.  Good effort

## 2014-01-22 LAB — THYROID ANTIBODIES: Thyroperoxidase Ab SerPl-aCnc: 16.1 IU/mL (ref <35.0)

## 2014-01-22 MED ORDER — HYDROCODONE-ACETAMINOPHEN 5-325 MG PO TABS
1.0000 | ORAL_TABLET | Freq: Once | ORAL | Status: AC
  Administered 2014-01-22: 1 via ORAL

## 2014-01-22 MED ORDER — METOPROLOL SUCCINATE ER 100 MG PO TB24
100.0000 mg | ORAL_TABLET | Freq: Every day | ORAL | Status: DC
  Administered 2014-01-22 – 2014-01-31 (×9): 100 mg via ORAL
  Filled 2014-01-22 (×12): qty 1

## 2014-01-22 MED ORDER — ISOSORBIDE MONONITRATE ER 30 MG PO TB24
60.0000 mg | ORAL_TABLET | Freq: Every day | ORAL | Status: DC
  Administered 2014-01-22 – 2014-02-01 (×11): 60 mg via ORAL
  Filled 2014-01-22: qty 2
  Filled 2014-01-22 (×2): qty 1
  Filled 2014-01-22 (×2): qty 2
  Filled 2014-01-22 (×2): qty 1
  Filled 2014-01-22: qty 2
  Filled 2014-01-22: qty 1
  Filled 2014-01-22: qty 2
  Filled 2014-01-22: qty 1
  Filled 2014-01-22: qty 2

## 2014-01-22 MED ORDER — DOCUSATE SODIUM 100 MG PO CAPS
100.0000 mg | ORAL_CAPSULE | Freq: Two times a day (BID) | ORAL | Status: DC
  Administered 2014-01-22 – 2014-02-01 (×18): 100 mg via ORAL
  Filled 2014-01-22 (×21): qty 1

## 2014-01-22 NOTE — Progress Notes (Signed)
Physical Therapy Treatment Patient Details Name: Crystal Stein MRN: [id number] DOB: 08/24/1970 Today's Date: 01/22/2014    History of Present Illness [facility] with [facility]. Pt had recent adm [facility] with NSTEMI. Pt began having weakness and sensory changes in LE's. Pt diagnosed with Drema BalzarineGuillane Barre and started on INIg and transferred to [facility].    PT Comments    Pt with significant leg weakness <2/5 in LE's making mobility difficult. Feel pt needs CIR.  Follow Up Recommendations  CIR     Equipment Recommendations  Other (comment) (to be determined.)    Recommendations for Other Services OT consult;Rehab consult     Precautions / Restrictions Precautions Precautions: Fall    Mobility  Bed Mobility Overal bed mobility: Needs Assistance Bed Mobility: Supine to Sit;Sit to Supine     Supine to sit: Mod assist Sit to supine: Mod assist   General bed mobility comments: Assist to bring legs off of bed and to raise trunk into sitting. Assist to bring legs back onto bed and control descent of trunk.  Transfers                 General transfer comment: Attempted x 2 to stand with Stedy and 2 person assist but pt unable due to weakness.  Ambulation/Gait                 Stairs            Wheelchair Mobility    Modified Rankin (Stroke Patients Only)       Balance   Sitting-balance support: Bilateral upper extremity supported;Feet supported Sitting balance-Leahy Scale: Poor Sitting balance - Comments: Supervision and UE assist for static sitting EOB.                            Cognition Arousal/Alertness: Awake/alert Behavior During Therapy: Anxious Overall Cognitive Status: Within Functional Limits for tasks assessed                      Exercises General Exercises - Lower Extremity Long Arc Quad: AAROM;Both;10 reps;Seated    General Comments        Pertinent Vitals/Pain VSS. Pain in legs  with activity.    Home Living                      Prior Function            PT Goals (current goals can now be found in the care plan section) Acute Rehab PT Goals PT Goal Formulation: With patient Time For Goal Achievement: 02/05/14 Potential to Achieve Goals: Fair Progress towards PT goals: Progressing toward goals    Frequency  Min 3X/week    PT Plan Discharge plan needs to be updated    Co-evaluation             End of Session Equipment Utilized During Treatment: Gait belt Activity Tolerance: Patient limited by fatigue Patient left: in bed;with call bell/phone within reach     Time: [id number] PT Time Calculation (min): 14 min  Charges:  $Therapeutic Activity: 8-22 mins                    G Codes:      Danial Hlavac 01/22/2014, 1:51 PM  [employer] Venuto PT [phone]

## 2014-01-22 NOTE — Progress Notes (Signed)
Pt complains of numbness and tingling that has now spread up to chest. She states that "it's numb, but also an achy pain that is 10/10." RRT notified, and at bedside. Also notified Merdis DelayK. Schorr, NP. New orders given to manage pain. Will continue to monitor closely.

## 2014-01-22 NOTE — Progress Notes (Signed)
RN called due to patient stated she was feeling as numbness was moving upwards. RT explained to patient upon entering room that she would need to perform the NIF and FVC.  Patient performed NIF (-40) and FVC (2.0) with good effort but still have complaint of numbness and pain. RN is aware of results.

## 2014-01-22 NOTE — Progress Notes (Signed)
Rehab Admissions Coordinator Note:  Patient was screened by Monea Pesantez L for appropriateness for an Inpatient Acute Rehab Consult.  At this time, we are recommending Inpatient Rehab consult.  Monseratt Ledin, PT Rehabilitation Admissions Coordinator [phone]  

## 2014-01-22 NOTE — Progress Notes (Signed)
NIF -40, FVC 1.6. Pt was in pain but had good effort when performing NIF and FVC

## 2014-01-22 NOTE — Progress Notes (Signed)
[place] TEAM 1 - Stepdown/ICU TEAM Progress Note  Victorino DikeJennifer P Davern ZOX:[id number] DOB: 03/21/1971 DOA: 01/16/2014 PCP: No PCP Per Patient  Admit HPI / Brief Narrative: 43 year old WF PMHx Anxiety d/o, substance abuse, tobacco abuse, HLD, [facility] where she was treated for non-ST elevation MI. She had elevated troponin and underwent cardiac catheterization which did not reveal any significant coronary artery disease and a hyperdynamic EF.  The patient was discharged home and within 4 days return to [facility]. She complained of recurrent chest pain and had elevated troponin. She was seen by cardiology who felt that her elevated troponin was related to her prior MI and did not indicate a new event. Recommendations were to continue the beta blocker and focus on stress management. The patient also described worsening lower extremity weakness and inability to stand. She was able to move her legs in her bed, but was unable to stand.   In the ER an MRI of the lumbar spine did not show any acute abnormalities. She was seen by psychiatry as well as neurology. After being evaluated by neurology, there was concern the patient was developing Guillan Barre syndrome. She underwent lumbar puncture and studies have been sent. She had been started on intravenous immunoglobulin per neurology recommendations. Because there was not [facility].  HPI/Subjective: Alert and primarily complaining of hands numb and can't move legs  Assessment/Plan:  Guillain Barre syndrome? -Continue IV Ig per neurology x 5 days -Continue NIF - has remained stable and not indicative of impending respiratory failure -MRI C-spine negative -Myasthenia panel pending -CSF preliminary findings nondiagnostic -continue foley for acute urinary retention -begin stool softener- may need daily lax suppository to promote bowel movements -pt had low CD4 count in  2010 with nonreactive HIV and HIV is nonreactive this admit  Subclinical hypothyroidism -Free T4 essentially normal -TSH 6.8 -cont Synthroid 25 mcg daily -doubt this is contributing to her current neurological symptoms  Acute encephalopathy -Resolving- patient still mildly confused.  Elevated transaminase -Abdominal ultrasound nondiagnostic see results below  Anxiety/depression.  -Significant stress around divorce and custody issues. Continue Valium 5 mg QID PRN -Psychiatry appointment at discharge  Klebsiella UTI -Continue ciprofloxacin  Code Status: FULL Family Communication: no family present at time of exam Disposition Plan: Stepdown until GBS sx's show signs of resolution  Consultants: Dr. Thana FarrLeslie Reynolds (neurologist)  Procedure/Significant Events: 7/28 UDS; positive marijuana/benzodiazepine 7/28 PCXR;Old right rib fractures are present. 7/28 CT angiogram chest PE protocol;No pulmonary emboli or other acute abnormalities of the chest.  -New hepatomegaly and hepatic steatosis. 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 Normal MRI appearance of the cervical/ upper thoracic spinal cord without pathologic enhancement.   Culture  7/30 urine positive Klebsiella pneumoniae 8/1 CSF  Cryptococcal antigen negative  VDRL pending  Culture pending  Antibiotics: Ciprofloxacin 7/31>> IVIG 8/1>>  DVT prophylaxis: Heparin per pharmacy  Objective: VITAL SIGNS: Temp: 98 F (36.7 C) (08/03 1143) Temp src: Oral (08/03 1143) BP: 140/99 mmHg (08/03 1200) Pulse Rate: 111 (08/03 1200) SPO2; 98% on room air FIO2:   Intake/Output Summary (Last 24 hours) at 01/22/14 1413 Last data filed  at 01/22/14 1200  Gross per 24 hour  Intake 2879.4 ml  Output   1650 ml  Net 1229.4 ml   Exam: General: No acute respiratory distress Lungs: Clear to auscultation bilaterally without wheezes or crackles Cardiovascular: Tachycardic, Regular rhythm without murmur gallop or rub Abdomen: Nontender, nondistended, soft, bowel sounds positive, no rebound, no ascites, no appreciable mass Extremities: No significant cyanosis, clubbing, or edema bilateral lower extremities Neurologic; PERL, moves arms weakly 2-3/5, unable to lift legs off bed but able to briskly wiggle toes- sensation appears intact albeit blunted from toes to hands-sitting upright in bed appears to have trunk stability  Data Reviewed: Basic Metabolic Panel:  Recent Labs Lab 01/16/14 1615 01/17/14 0327 01/18/14 0533 01/18/14 0756 01/19/14 0602 01/19/14 1745 01/20/14 0630 01/21/14 0230  NA 135* 137 139  --  137  --  137 135*  K 3.2* 3.9 3.1*  --  6.2* 4.8 3.6* 4.0  CL 97 102 107  --  107  --  105 100  CO2 18* 18* 21  --  20  --  22 24  GLUCOSE 113* 90 92  --  81  --  101* 92  BUN 8 6 3*  --  <3*  --  <3* 3*  CREATININE 0.49* 0.58 0.45*  --  0.42*  --  0.45* 0.48*  CALCIUM 8.9 8.3* 7.8*  --  8.2*  --  7.7* 8.5  MG 1.5  --   --  1.5  --   --   --   --    Liver Function Tests:  Recent Labs Lab 01/17/14 0327 01/18/14 0533 01/19/14 0602 01/20/14 0630  AST 359* 196* 225* 269*  ALT 112* 84* 89* 93*  ALKPHOS 257* 284* 337* 310*  BILITOT 0.9 1.0 1.1 1.0  PROT 5.6* 5.4* 5.9* 5.2*  ALBUMIN 2.1* 2.1* 2.3* 1.9*    Recent Labs Lab 01/19/14 1745  LIPASE 44   No results found for this basename: AMMONIA,  in the last 168 hours CBC:  Recent Labs Lab 01/16/14 1615 01/17/14 0327 01/20/14 0630  WBC 6.9 7.0 5.5  NEUTROABS 3.3  --   --   HGB 12.9 11.0* 10.5*  HCT 35.2* 30.8* 30.5*  MCV 90.3 91.1 94.4  PLT 315 288 243   Cardiac Enzymes:  Recent Labs Lab 01/16/14 1615 01/16/14 2204 01/17/14 0327  01/17/14 0939  TROPONINI 1.14* 0.75* 0.66* 0.74*   BNP (last 3 results)  Recent Labs  01/11/14 0610  PROBNP 259.3*   CBG: No results found for this basename: GLUCAP,  in the last 168 hours  Recent Results (from the past 240 hour(s))  URINE CULTURE     Status: None   Collection Time    01/18/14  9:25 AM      Result Value Ref Range Status   Specimen Description URINE, CLEAN CATCH   Final   Special Requests NONE   Final   Culture  Setup Time     Final   Value: 01/18/2014 19:44     Performed at [facility] Count     Final   Value: >=100,000 COLONIES/ML     Performed at [facility]   Culture     Final   Value: KLEBSIELLA PNEUMONIAE     Performed at [facility]   Report Status 01/20/2014 FINAL   Final   Organism ID, Bacteria KLEBSIELLA PNEUMONIAE   Final  CSF CULTURE     Status: None   Collection Time    01/20/14  1:57 PM  Result Value Ref Range Status   Specimen Description CSF   Final   Special Requests Normal   Final   Gram Stain     Final   Value: CYTOSPIN NO WBC SEEN     NO ORGANISMS SEEN     Performed at [facility]   Culture     Final   Value: NO GROWTH 2 DAYS     Performed at [facility]   Report Status PENDING   Incomplete     Studies:  Recent x-ray studies have been reviewed in detail by the Attending Physician  Scheduled Meds:  Scheduled Meds: . antiseptic oral rinse  7 mL Mouth Rinse BID  . ciprofloxacin  250 mg Oral BID  . heparin subcutaneous  5,000 Units Subcutaneous 3 times per day  . IMMUNE GLOBULIN 10% (HUMAN) IV - For Fluid Restriction Only  400 mg/kg Intravenous Q24H  . isosorbide mononitrate  60 mg Oral Daily  . levothyroxine  25 mcg Oral QAC breakfast  . metoprolol succinate  100 mg Oral Daily  . pantoprazole  40 mg Oral Daily    Time spent on care of this patient: 35 mins   ELLIS,ALLISON L. , ANP   [facility] Office  [phone] Pager -  [phone]  On-Call/Text Page:      [email or web address]      password [email or web address]  If 7PM-7AM, please contact night-coverage [email or web address] Password [email or web address] 01/22/2014, 2:13 PM   LOS: 6 days   I have personally examined this patient and reviewed the entire database. I have reviewed the above note, made any necessary editorial changes, and agree with its content.  Lonia Blood, MD [facility]

## 2014-01-22 NOTE — Progress Notes (Signed)
Subjective: Patient reports no change in her strength or sensation.  Increased residual noted on yesterday and with foley in place today.    Objective: Current vital signs: BP 147/121  Pulse 113  Temp(Src) 98 F (36.7 C) (Oral)  Resp 20  Ht 5' 4.5" (1.638 m)  Wt 50.4 kg (111 lb 1.8 oz)  BMI 18.78 kg/m2  SpO2 98%  LMP 04/23/2011 Vital signs in last 24 hours: Temp:  [97.8 F (36.6 C)-99 F (37.2 C)] 98 F (36.7 C) (08/03 0755) Pulse Rate:  [105-113] 113 (08/03 0900) Resp:  [20-21] 20 (08/03 0800) BP: (107-147)/(79-121) 147/121 mmHg (08/03 0800) SpO2:  [95 %-99 %] 98 % (08/03 0900)  Intake/Output from previous day: 08/02 0701 - 08/03 0700 In: 2575.9 [P.O.:875; I.V.:1700.9] Out: 1150 [Urine:1150] Intake/Output this shift: Total I/O In: 483.5 [P.O.:120; I.V.:363.5] Out: -  Nutritional status: Cardiac  Neurologic Exam: Mental Status:  Alert, oriented, thought content appropriate. Speech fluent without evidence of aphasia. Able to follow 3 step commands without difficulty.  Cranial Nerves:  II: Discs flat bilaterally; Visual fields grossly normal, pupils equal, round, reactive to light and accommodation  III,IV, VI: ptosis not present, extra-ocular motions intact bilaterally  V,VII: smile symmetric, facial light touch sensation normal bilaterally  VIII: hearing normal bilaterally  IX,X: gag reflex present  XI: bilateral shoulder shrug  XII: midline tongue extension  Motor:  Right : Upper extremity 4/5    Left: Upper extremity 4/5   Lower extremity 2/5             Lower extremity 2/5  Tone and bulk:normal tone throughout; no atrophy noted  Sensory: Pinprick and light touch decreased below C4-5 dermatomal level. Temperature sensation intact  Deep Tendon Reflexes: 2+ in the upper extremities, absent in the lower extremities  Plantars:  Right: downgoing Left: downgoing  Cerebellar:  Normal finger-to-nose testing in the upper extremities. Heel-to-shin testing unable to be  performed  Gait: Unable to test due to weakness   Lab Results: Basic Metabolic Panel:  Recent Labs Lab 01/16/14 1615 01/17/14 0327 01/18/14 0533 01/18/14 0756 01/19/14 0602 01/19/14 1745 01/20/14 0630 01/21/14 0230  NA 135* 137 139  --  137  --  137 135*  K 3.2* 3.9 3.1*  --  6.2* 4.8 3.6* 4.0  CL 97 102 107  --  107  --  105 100  CO2 18* 18* 21  --  20  --  22 24  GLUCOSE 113* 90 92  --  81  --  101* 92  BUN 8 6 3*  --  <3*  --  <3* 3*  CREATININE 0.49* 0.58 0.45*  --  0.42*  --  0.45* 0.48*  CALCIUM 8.9 8.3* 7.8*  --  8.2*  --  7.7* 8.5  MG 1.5  --   --  1.5  --   --   --   --     Liver Function Tests:  Recent Labs Lab 01/17/14 0327 01/18/14 0533 01/19/14 0602 01/20/14 0630  AST 359* 196* 225* 269*  ALT 112* 84* 89* 93*  ALKPHOS 257* 284* 337* 310*  BILITOT 0.9 1.0 1.1 1.0  PROT 5.6* 5.4* 5.9* 5.2*  ALBUMIN 2.1* 2.1* 2.3* 1.9*    Recent Labs Lab 01/19/14 1745  LIPASE 44   No results found for this basename: AMMONIA,  in the last 168 hours  CBC:  Recent Labs Lab 01/16/14 1615 01/17/14 0327 01/20/14 0630  WBC 6.9 7.0 5.5  NEUTROABS 3.3  --   --  HGB 12.9 11.0* 10.5*  HCT 35.2* 30.8* 30.5*  MCV 90.3 91.1 94.4  PLT 315 288 243    Cardiac Enzymes:  Recent Labs Lab 01/16/14 1615 01/16/14 2204 01/17/14 0327 01/17/14 0939  TROPONINI 1.14* 0.75* 0.66* 0.74*    Lipid Panel: No results found for this basename: CHOL, TRIG, HDL, CHOLHDL, VLDL, LDLCALC,  in the last 168 hours  CBG: No results found for this basename: GLUCAP,  in the last 168 hours  Microbiology: Results for orders placed during the hospital encounter of 01/16/14  URINE CULTURE     Status: None   Collection Time    01/18/14  9:25 AM      Result Value Ref Range Status   Specimen Description URINE, CLEAN CATCH   Final   Special Requests NONE   Final   Culture  Setup Time     Final   Value: 01/18/2014 19:44     Performed at [facility] Count     Final    Value: >=100,000 COLONIES/ML     Performed at [facility]   Culture     Final   Value: KLEBSIELLA PNEUMONIAE     Performed at [facility]   Report Status 01/20/2014 FINAL   Final   Organism ID, Bacteria KLEBSIELLA PNEUMONIAE   Final  CSF CULTURE     Status: None   Collection Time    01/20/14  1:57 PM      Result Value Ref Range Status   Specimen Description CSF   Final   Special Requests Normal   Final   Gram Stain     Final   Value: CYTOSPIN NO WBC SEEN     NO ORGANISMS SEEN     Performed at [facility]   Culture     Final   Value: NO GROWTH 1 DAY     Performed at [facility]   Report Status PENDING   Incomplete    Coagulation Studies: No results found for this basename: LABPROT, INR,  in the last 72 hours  Imaging: Mr Cervical Spine W Wo Contrast  01/21/2014   CLINICAL DATA:  Progressive numbness and weakness beginning in the legs and now involving the upper extremities.  EXAM: MRI CERVICAL SPINE WITHOUT AND WITH CONTRAST  TECHNIQUE: Multiplanar and multiecho pulse sequences of the cervical spine, to include the craniocervical junction and cervicothoracic junction, were obtained according to standard protocol without and with intravenous contrast.  CONTRAST:  10mL MULTIHANCE GADOBENATE DIMEGLUMINE 529 MG/ML IV SOLN  COMPARISON:  MRI lumbar spine 01/18/2014.  FINDINGS: Normal signal is present in the cervical and upper thoracic spinal cord to the lowest imaged level, T3-4. Marrow signal, vertebral body heights, and alignment are normal. The postcontrast images demonstrate no pathologic enhancement.  The craniocervical junction is within normal limits. Flow is present in the vertebral arteries.  C2-3:  Negative.  C3-4:  Negative.  C4-5: Mild uncovertebral spurring is worse on the right. There is no significant associated stenosis.  C5-6: A broad-based disc osteophyte complex is asymmetric to the left. Uncovertebral spurring is noted bilaterally. Mild  left foraminal narrowing is present.  C6-7: A shallow central disc protrusion partially effaces the ventral CSF without significant stenosis.  C7-T1:  Negative.  IMPRESSION: 1. Normal MRI appearance of the cervical and upper thoracic spinal cord without pathologic enhancement. 2. Mild left foraminal narrowing at C5-6 due to a leftward disc osteophyte complex and uncovertebral spurring. 3. Mild right-sided  uncovertebral spurring at C4-5 without significant stenosis. 4. Shallow central disc protrusion at C6-7 without significant stenosis.   Electronically Signed   By: Gennette Pac M.D.   On: 01/21/2014 13:31    Medications:  I have reviewed the patient's current medications. Scheduled: . antiseptic oral rinse  7 mL Mouth Rinse BID  . ciprofloxacin  250 mg Oral BID  . heparin subcutaneous  5,000 Units Subcutaneous 3 times per day  . IMMUNE GLOBULIN 10% (HUMAN) IV - For Fluid Restriction Only  400 mg/kg Intravenous Q24H  . isosorbide mononitrate  60 mg Oral Daily  . levothyroxine  25 mcg Oral QAC breakfast  . metoprolol succinate  100 mg Oral Daily  . pantoprazole  40 mg Oral Daily    Assessment/Plan: Patient unchanged s/p 2 days IVIg.  NIF and VC normal.  MRI of the cervical spine reviewed and shows a normal appearing cord.  Thyroid antibodies and MG panel pending.    Recommendations: 1.  Will continue IVIg for 5 days 2.  PT/OT and CIR evaluation   LOS: 6 days   Thana Farr, MD [facility] [phone] 01/22/2014  9:47 AM

## 2014-01-22 NOTE — Progress Notes (Signed)
Daily NIF -44, VC 2.9L

## 2014-01-23 DIAGNOSIS — G610 Guillain-Barre syndrome: Secondary | ICD-10-CM

## 2014-01-23 DIAGNOSIS — F319 Bipolar disorder, unspecified: Secondary | ICD-10-CM

## 2014-01-23 DIAGNOSIS — R29898 Other symptoms and signs involving the musculoskeletal system: Secondary | ICD-10-CM

## 2014-01-23 LAB — CSF CULTURE W GRAM STAIN: Gram Stain: NONE SEEN

## 2014-01-23 LAB — HEPATITIS PANEL, ACUTE
HCV AB: NEGATIVE
HEP A IGM: NONREACTIVE
HEP B C IGM: NONREACTIVE
Hepatitis B Surface Ag: NEGATIVE

## 2014-01-23 LAB — CBC
HEMATOCRIT: 28.1 % — AB (ref 36.0–46.0)
Hemoglobin: 9.5 g/dL — ABNORMAL LOW (ref 12.0–15.0)
MCH: 31.9 pg (ref 26.0–34.0)
MCHC: 33.8 g/dL (ref 30.0–36.0)
MCV: 94.3 fL (ref 78.0–100.0)
PLATELETS: 290 10*3/uL (ref 150–400)
RBC: 2.98 MIL/uL — ABNORMAL LOW (ref 3.87–5.11)
RDW: 16.2 % — ABNORMAL HIGH (ref 11.5–15.5)
WBC: 5 10*3/uL (ref 4.0–10.5)

## 2014-01-23 LAB — CSF CULTURE: CULTURE: NO GROWTH

## 2014-01-23 LAB — BASIC METABOLIC PANEL
ANION GAP: 10 (ref 5–15)
BUN: 8 mg/dL (ref 6–23)
CO2: 24 mEq/L (ref 19–32)
Calcium: 8.4 mg/dL (ref 8.4–10.5)
Chloride: 98 mEq/L (ref 96–112)
Creatinine, Ser: 0.44 mg/dL — ABNORMAL LOW (ref 0.50–1.10)
GFR calc Af Amer: >90 mL/min (ref >90)
GLUCOSE: 79 mg/dL (ref 70–99)
Potassium: 4.2 mEq/L (ref 3.7–5.3)
SODIUM: 132 meq/L — AB (ref 137–147)

## 2014-01-23 LAB — OSMOLALITY, URINE: Osmolality, Ur: 243 mOsm/kg — ABNORMAL LOW (ref 390–1090)

## 2014-01-23 LAB — SODIUM, URINE, RANDOM: Sodium, Ur: 87 mEq/L

## 2014-01-23 LAB — SEDIMENTATION RATE: Sed Rate: 70 mm/hr — ABNORMAL HIGH (ref 0–22)

## 2014-01-23 LAB — OSMOLALITY: Osmolality: 273 mOsm/kg — ABNORMAL LOW (ref 275–300)

## 2014-01-23 MED ORDER — OXYCODONE HCL 5 MG PO TABS
5.0000 mg | ORAL_TABLET | Freq: Four times a day (QID) | ORAL | Status: DC | PRN
  Administered 2014-01-23 – 2014-01-30 (×17): 10 mg via ORAL
  Administered 2014-01-30: 5 mg via ORAL
  Administered 2014-01-31 (×2): 10 mg via ORAL
  Administered 2014-01-31: 5 mg via ORAL
  Administered 2014-02-01 (×2): 10 mg via ORAL
  Filled 2014-01-23 (×3): qty 2
  Filled 2014-01-23: qty 1
  Filled 2014-01-23 (×12): qty 2
  Filled 2014-01-23: qty 1
  Filled 2014-01-23 (×7): qty 2

## 2014-01-23 MED ORDER — HYDROCODONE-ACETAMINOPHEN 5-325 MG PO TABS: 1.0000 | ORAL_TABLET | Freq: Four times a day (QID) | ORAL | Status: DC | PRN

## 2014-01-23 NOTE — Progress Notes (Signed)
Subjective: Patient awake and alert.  Reports no worsening or improvement in her symptoms.  With conversation is breathy and cough is weak.  Decrease in VC noted on yesterday but felt that patient was limited secondary to pain.    Objective: Current vital signs: BP 138/101  Pulse 104  Temp(Src) 97.9 F (36.6 C) (Oral)  Resp 19  Ht 5' 4.5" (1.638 m)  Wt 52 kg (114 lb 10.2 oz)  BMI 19.38 kg/m2  SpO2 98%  LMP 04/23/2011 Vital signs in last 24 hours: Temp:  [97.5 F (36.4 C)-98.9 F (37.2 C)] 97.9 F (36.6 C) (08/04 0800) Pulse Rate:  [92-116] 104 (08/04 0800) Resp:  [19-28] 19 (08/03 1600) BP: (107-151)/(82-113) 138/101 mmHg (08/04 0800) SpO2:  [96 %-100 %] 98 % (08/04 0800) Weight:  [52 kg (114 lb 10.2 oz)] 52 kg (114 lb 10.2 oz) (08/04 0500)  Intake/Output from previous day: 08/03 0701 - 08/04 0700 In: 4163.7 [P.O.:840; I.V.:3323.7] Out: 1495 [Urine:1495] Intake/Output this shift: Total I/O In: 10 [I.V.:10] Out: -  Nutritional status: Cardiac  Neurologic Exam: Mental Status:  Alert, oriented, thought content appropriate. Speech fluent without evidence of aphasia. Able to follow 3 step commands without difficulty.  Cranial Nerves:  II: Discs flat bilaterally; Visual fields grossly normal, pupils equal, round, reactive to light and accommodation  III,IV, VI: ptosis not present, extra-ocular motions intact bilaterally  V,VII: smile symmetric, facial light touch sensation normal bilaterally  VIII: hearing normal bilaterally  IX,X: gag reflex present  XI: bilateral shoulder shrug  XII: midline tongue extension  Motor:  Right : Upper extremity 4/5         Left: Upper extremity 4/5   Lower extremity 2/5      Lower extremity 2/5  Tone and bulk:normal tone throughout; no atrophy noted  Sensory: Pinprick and light touch decreased below C4-5 dermatomal level. Temperature sensation intact  Deep Tendon Reflexes: 2+ in the upper extremities, absent in the lower extremities   Plantars:  Right: downgoing    Left: downgoing  Cerebellar:  Normal finger-to-nose testing in the upper extremities. Heel-to-shin testing unable to be performed  Gait: Unable to test due to weakness      Lab Results: Basic Metabolic Panel:  Recent Labs Lab 01/16/14 1615  01/18/14 0533 01/18/14 0756 01/19/14 0602 01/19/14 1745 01/20/14 0630 01/21/14 0230 01/23/14 0308  NA 135*  < > 139  --  137  --  137 135* 132*  K 3.2*  < > 3.1*  --  6.2* 4.8 3.6* 4.0 4.2  CL 97  < > 107  --  107  --  105 100 98  CO2 18*  < > 21  --  20  --  22 24 24   GLUCOSE 113*  < > 92  --  81  --  101* 92 79  BUN 8  < > 3*  --  <3*  --  <3* 3* 8  CREATININE 0.49*  < > 0.45*  --  0.42*  --  0.45* 0.48* 0.44*  CALCIUM 8.9  < > 7.8*  --  8.2*  --  7.7* 8.5 8.4  MG 1.5  --   --  1.5  --   --   --   --   --   < > = values in this interval not displayed.  Liver Function Tests:  Recent Labs Lab 01/17/14 0327 01/18/14 0533 01/19/14 0602 01/20/14 0630  AST 359* 196* 225* 269*  ALT 112* 84* 89* 93*  ALKPHOS 257* 284* 337* 310*  BILITOT 0.9 1.0 1.1 1.0  PROT 5.6* 5.4* 5.9* 5.2*  ALBUMIN 2.1* 2.1* 2.3* 1.9*    Recent Labs Lab 01/19/14 1745  LIPASE 44   No results found for this basename: AMMONIA,  in the last 168 hours  CBC:  Recent Labs Lab 01/16/14 1615 01/17/14 0327 01/20/14 0630 01/23/14 0308  WBC 6.9 7.0 5.5 5.0  NEUTROABS 3.3  --   --   --   HGB 12.9 11.0* 10.5* 9.5*  HCT 35.2* 30.8* 30.5* 28.1*  MCV 90.3 91.1 94.4 94.3  PLT 315 288 243 290    Cardiac Enzymes:  Recent Labs Lab 01/16/14 1615 01/16/14 2204 01/17/14 0327 01/17/14 0939  TROPONINI 1.14* 0.75* 0.66* 0.74*    Lipid Panel: No results found for this basename: CHOL, TRIG, HDL, CHOLHDL, VLDL, LDLCALC,  in the last 168 hours  CBG: No results found for this basename: GLUCAP,  in the last 168 hours  Microbiology: Results for orders placed during the hospital encounter of 01/16/14  URINE CULTURE     Status:  None   Collection Time    01/18/14  9:25 AM      Result Value Ref Range Status   Specimen Description URINE, CLEAN CATCH   Final   Special Requests NONE   Final   Culture  Setup Time     Final   Value: 01/18/2014 19:44     Performed at [facility] Count     Final   Value: >=100,000 COLONIES/ML     Performed at [facility]   Culture     Final   Value: KLEBSIELLA PNEUMONIAE     Performed at [facility]   Report Status 01/20/2014 FINAL   Final   Organism ID, Bacteria KLEBSIELLA PNEUMONIAE   Final  CSF CULTURE     Status: None   Collection Time    01/20/14  1:57 PM      Result Value Ref Range Status   Specimen Description CSF   Final   Special Requests Normal   Final   Gram Stain     Final   Value: CYTOSPIN NO WBC SEEN     NO ORGANISMS SEEN     Performed at [facility]   Culture     Final   Value: NO GROWTH 2 DAYS     Performed at [facility]   Report Status PENDING   Incomplete    Coagulation Studies: No results found for this basename: LABPROT, INR,  in the last 72 hours  Imaging: Mr Cervical Spine W Wo Contrast  01/21/2014   CLINICAL DATA:  Progressive numbness and weakness beginning in the legs and now involving the upper extremities.  EXAM: MRI CERVICAL SPINE WITHOUT AND WITH CONTRAST  TECHNIQUE: Multiplanar and multiecho pulse sequences of the cervical spine, to include the craniocervical junction and cervicothoracic junction, were obtained according to standard protocol without and with intravenous contrast.  CONTRAST:  10mL MULTIHANCE GADOBENATE DIMEGLUMINE 529 MG/ML IV SOLN  COMPARISON:  MRI lumbar spine 01/18/2014.  FINDINGS: Normal signal is present in the cervical and upper thoracic spinal cord to the lowest imaged level, T3-4. Marrow signal, vertebral body heights, and alignment are normal. The postcontrast images demonstrate no pathologic enhancement.  The craniocervical junction is within normal limits. Flow is  present in the vertebral arteries.  C2-3:  Negative.  C3-4:  Negative.  C4-5: Mild uncovertebral spurring is worse on the  right. There is no significant associated stenosis.  C5-6: A broad-based disc osteophyte complex is asymmetric to the left. Uncovertebral spurring is noted bilaterally. Mild left foraminal narrowing is present.  C6-7: A shallow central disc protrusion partially effaces the ventral CSF without significant stenosis.  C7-T1:  Negative.  IMPRESSION: 1. Normal MRI appearance of the cervical and upper thoracic spinal cord without pathologic enhancement. 2. Mild left foraminal narrowing at C5-6 due to a leftward disc osteophyte complex and uncovertebral spurring. 3. Mild right-sided uncovertebral spurring at C4-5 without significant stenosis. 4. Shallow central disc protrusion at C6-7 without significant stenosis.   Electronically Signed   By: Gennette Pac M.D.   On: 01/21/2014 13:31    Medications:  I have reviewed the patient's current medications. Scheduled: . antiseptic oral rinse  7 mL Mouth Rinse BID  . ciprofloxacin  250 mg Oral BID  . docusate sodium  100 mg Oral BID  . heparin subcutaneous  5,000 Units Subcutaneous 3 times per day  . IMMUNE GLOBULIN 10% (HUMAN) IV - For Fluid Restriction Only  400 mg/kg Intravenous Q24H  . isosorbide mononitrate  60 mg Oral Daily  . levothyroxine  25 mcg Oral QAC breakfast  . metoprolol succinate  100 mg Oral Daily  . pantoprazole  40 mg Oral Daily    Assessment/Plan: Patient unchanged s/p 3 days IVIg. NIF and VC improved with NIF of -44 and VC of 2.4.  Thyroid antibodies are normal.  MG panel pending.  Recommendations: 1. Will continue IVIg for 5 days.  If no improvement after full 5 doses will need to consider if patient will require plasmaphoresis. 2. PT/OT  3. Continue daily NIF and VC     LOS: 7 days   Thana Farr, MD [facility] [phone] 01/23/2014  10:01 AM

## 2014-01-23 NOTE — Progress Notes (Signed)
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 Pt recently admitted for chest pain 5 days PTA. PMH: anxiety/depression , bipolar disorder and smokes cigarettes    PT Comments    Pt progressing towards physical therapy goals. Was able to tolerate static standing at EOB today with +2 assist, maintaining for 2 minutes. Pt able to make corrective changes to improve posture while maintaining static standing. [place] Dec plus may be appropriate next session for OOB to chair. Will continue to follow and progress as able per POC.   Follow Up Recommendations  CIR     Equipment Recommendations  Wheelchair (measurements PT);Wheelchair cushion (measurements PT);Rolling walker with 5" wheels    Recommendations for Other Services OT consult;Rehab consult     Precautions / Restrictions Precautions Precautions: Fall Restrictions Weight Bearing Restrictions: No    Mobility  Bed Mobility Overal bed mobility: Needs Assistance Bed Mobility: Rolling;Sidelying to Sit;Sit to Sidelying Rolling: Min assist Sidelying to sit: Mod assist Supine to sit: Mod assist   Sit to sidelying: Mod assist;+2 for physical assistance General bed mobility comments: Pt with heavy use of bed rail and pushing up with Lt UE  Transfers Overall transfer level: Needs assistance Equipment used: 2 person hand held assist Transfers: Sit to/from Stand Sit to Stand: Max assist;+2 physical assistance         General transfer comment: Pt able to stand EOB with max +2 assist. Pt with bilateral knee hyperextension, however was able to maintain standing with knees locked. VC's for improved posture, and pt able to make corrective changes.  Ambulation/Gait                 Stairs            Wheelchair Mobility    Modified Rankin (Stroke Patients Only)        Balance Overall balance assessment: Needs assistance Sitting-balance support: Bilateral upper extremity supported Sitting balance-Leahy Scale: Poor     Standing balance support: Bilateral upper extremity supported Standing balance-Leahy Scale: Zero                      Cognition Arousal/Alertness: Awake/alert Behavior During Therapy: WFL for tasks assessed/performed Overall Cognitive Status: Within Functional Limits for tasks assessed                      Exercises General Exercises - Lower Extremity Quad Sets: 10 reps Long Arc Quad: 5 reps Heel Slides: 5 reps    General Comments        Pertinent Vitals/Pain Vitals stable throughout session.     Home Living Family/patient expects to be discharged to:: Inpatient rehab Living Arrangements: Parent             Additional Comments: Pt currently staying at parents house due to custody issues with children. pt plans to return to [place] residence at some point.     Prior Function Level of Independence: Independent  Gait / Transfers Assistance Needed: independent with progressive weekness with x1 week PTA of bed level care       PT Goals (current goals can now be found in the care plan section) Acute Rehab PT Goals Patient Stated Goal: To stand independently PT Goal Formulation: With patient Time For Goal Achievement: 02/05/14 Potential to Achieve Goals: Fair Progress towards PT goals: Progressing toward  goals    Frequency  Min 3X/week    PT Plan Current plan remains appropriate    Co-evaluation             End of Session Equipment Utilized During Treatment: Gait belt Activity Tolerance: Patient limited by fatigue Patient left: in bed;with call bell/phone within reach     Time: 1414-1435 PT Time Calculation (min): 21 min  Charges:  $Therapeutic Activity: 8-22 mins                    G Codes:      Ruthann CancerHamilton, Verdean Murin 01/23/2014, 5:33 PM  Ruthann CancerLaura Hamilton, PT, DPT Acute  Rehabilitation Services Pager: [phone]

## 2014-01-23 NOTE — Evaluation (Signed)
Occupational Therapy Evaluation Patient Details Name: Crystal DikeJennifer P Stein MRN: [id number] DOB: 07/08/1970 Today's Date: 01/23/2014    History of Present Illness  43 yo female admitted for MI and Guillane Barre. Pt recently admitted for chest pain 5 days PTA. PMH: anxiety/depression , bipolar disorder and smokes cigarettes   Clinical Impression   PT admitted with Nstemi and Guillane Barre. Pt currently with functional limitiations due to the deficits listed below (see OT problem list). PTA pt was progressively becoming weaker over weeks time.  Pt will benefit from skilled OT to increase their independence and safety with adls and balance to allow discharge CIR. Ot to follow acutely for adl retraining and static sitting balance.      Follow Up Recommendations  CIR    Equipment Recommendations  Other (comment) (defer CIR)    Recommendations for Other Services Rehab consult     Precautions / Restrictions Precautions Precautions: Fall      Mobility Bed Mobility Overal bed mobility: Needs Assistance Bed Mobility: Supine to Sit;Sidelying to Sit   Sidelying to sit: Mod assist Supine to sit: Mod assist     General bed mobility comments: Pt with heavy use of bed rail and pushing up with Lt UE  Transfers Overall transfer level: Needs assistance Equipment used:  (stedy) Transfers: Sit to/from Stand Sit to Stand: +2 physical assistance;Max assist         General transfer comment: Pt completed sit<>Stand x2 in stedy during session. pt tolerated sustained sitting in upright position greater than 5 mins    Balance Overall balance assessment: Needs assistance Sitting-balance support: Bilateral upper extremity supported;Feet supported Sitting balance-Leahy Scale: Poor     Standing balance support: Bilateral upper extremity supported;During functional activity Standing balance-Leahy Scale: Zero                              ADL Overall ADL's : Needs  assistance/impaired     Grooming: Oral care;Wash/dry face;Min guard;Sitting Grooming Details (indicate cue type and reason): Pt sitting in stedy at sink level to wash face and complete oral care. Pt demonstrates good core strength                  Toilet Transfer: +2 for physical assistance;Maximal assistance           Functional mobility during ADLs: +2 for physical assistance;Maximal assistance (stedy ) General ADL Comments: Pt agreeable to OOB and eventually with time to the sink level. Pt became very anxious with sit<>Stand . pt agreeable due to stedy use. Pt tolerate use of stedy well. Pt educated that RN will use hoyer lift to return to bed. Rn called to be notified by OT      Vision                     Perception     Praxis      Pertinent Vitals/Pain Tolerating activity well premedicated     Hand Dominance Right   Extremity/Trunk Assessment Upper Extremity Assessment Upper Extremity Assessment: LUE deficits/detail;RUE deficits/detail RUE Deficits / Details: numbness from shoulder to finger tips. Pt reports forearm, palm and digits most numb. Grip strength 3 out 5. AROM shoulder flexion 80 degrees Pt able to sustain grasp on stedy during transfer. Pt able to scratch face and brush teeth with RT UE  RUE Coordination: decreased gross motor;decreased fine motor LUE Deficits / Details: numbnes from shoulder to finger tips. Pt reports  forearm, palm and digits most numb. Grip strength 3 out 5. AROM shoulder flexion 80 degrees Pt able to sustain grasp on stedy during transfer.  LUE Coordination: decreased fine motor;decreased gross motor   Lower Extremity Assessment Lower Extremity Assessment: Defer to PT evaluation   Cervical / Trunk Assessment Cervical / Trunk Assessment: Normal   Communication Communication Communication: No difficulties   Cognition Arousal/Alertness: Awake/alert Behavior During Therapy: Anxious Overall Cognitive Status: No  family/caregiver present to determine baseline cognitive functioning                     General Comments       Exercises       Shoulder Instructions      Home Living Family/patient expects to be discharged to:: Inpatient rehab Living Arrangements: Parent                               Additional Comments: Pt currently staying at parents house due to custody issues with children. pt plans to return to [place] residence at some point.       Prior Functioning/Environment Level of Independence: Independent  Gait / Transfers Assistance Needed: independent with progressive weekness with x1 week PTA of bed level care          OT Diagnosis: Generalized weakness   OT Problem List: Decreased strength;Decreased activity tolerance;Impaired balance (sitting and/or standing);Decreased safety awareness;Decreased knowledge of use of DME or AE;Decreased knowledge of precautions;Cardiopulmonary status limiting activity   OT Treatment/Interventions: Self-care/ADL training;DME and/or AE instruction;Therapeutic activities;Patient/family education;Balance training;Therapeutic exercise;Neuromuscular education;Energy conservation    OT Goals(Current goals can be found in the care plan section) Acute Rehab OT Goals Patient Stated Goal: to be able to go to the movies and see Crystal Stein 2 OT Goal Formulation: With patient Time For Goal Achievement: 02/06/14 Potential to Achieve Goals: Good  OT Frequency: Min 2X/week   Barriers to D/C:            Co-evaluation              End of Session Equipment Utilized During Treatment: Gait belt;Oxygen (stedy) Nurse Communication: Mobility status;Precautions;Need for lift equipment  Activity Tolerance: Patient tolerated treatment well Patient left: in chair;with call bell/phone within reach   Time: 0947-1009 OT Time Calculation (min): 22 min Charges:  OT General Charges $OT Visit: 1 Procedure OT Evaluation $Initial  OT Evaluation Tier I: 1 Procedure OT Treatments $Self Care/Home Management : 8-22 mins G-Codes:    Crystal Stein Feb 22, 2014, 3:25 PM Pager: [phone]

## 2014-01-23 NOTE — Progress Notes (Signed)
NIF -44 & VC 2.4 Pt gave good effort, will continue to monitor.

## 2014-01-23 NOTE — Consult Note (Signed)
Physical Medicine and Rehabilitation Consult  Reason for Consult: Quadriparesis. Referring Physician:  Dr Sharon Seller   HPI: Crystal Stein is a 43 y.o. female with history of depression with anxiety, recent NSTEMI, who was readmitted on 01/16/14 with recurrent chest pain as well as confusion. CTA chest negative for PE and new hepatomegaly and hepatic steatosis noted. UDS positive for marijuana and benzos.  Cardiology consulted and felt that symptoms due to psychosocial issues. Patient reported difficulty walking and well as BLE weakness on 07/29 and MRI lumbar spine without disc herniation or stenosis. 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 She was started on IVIG for treatment and has had complaints of worsening of numbness.  Confusion resolving. Kleb UTI treated with cipro and foley placed due to urinary retention. PT initiated and patient limited by significant weakness. CIR recommended by MD and rehab team.    Review of Systems  HENT: Negative for hearing loss.   Eyes: Negative for blurred vision and double vision.  Respiratory: Negative for cough, shortness of breath and wheezing.   Cardiovascular: Positive for chest pain (from upper chest down to abdomen.).  Gastrointestinal: Negative for heartburn, nausea and abdominal pain.  Musculoskeletal: Negative for back pain and myalgias.  Neurological: Positive for tingling, sensory change, focal weakness and weakness. Negative for headaches.  Psychiatric/Behavioral: The patient is nervous/anxious and has insomnia.     Past Medical History  Diagnosis Date  . Anxiety   . Depression   . Asthma   . 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 COPD (chronic obstructive pulmonary disease)   .  GERD (gastroesophageal reflux disease)   . Bipolar disorder   . Blood transfusion 2006  . History of cardiac catheterization July 2015    Angiographically normal coronary arteries with distal tortuosity  . NSTEMI (non-ST elevated myocardial infarction)     Not secondary to obstructive CAD or obvious plaque rupture, possibly vasospasm    Past Surgical History  Procedure Laterality Date  . Tennis elbow release  04/27/06    Left; [facility], [facility]  . Multiple tooth extractions  10/07    Full upper  . Orif proximal humerus fracture  08/06    Left,  . Dilation and curettage of uterus  June 2012    Failed ablation  . Tubal ligation  June 2012    [facility]  . Bipolar disorder    . Hysteroscopy  01/27/2011    Procedure: HYSTEROSCOPY;  Surgeon: Tilda Burrow, MD;  Location: [facility];  Service: Gynecology;  Laterality: N/A;  . Vaginal hysterectomy  05/19/2011    Procedure: HYSTERECTOMY VAGINAL;  Surgeon: Tilda Burrow, MD;  Location: [facility];  Service: Gynecology;  Laterality: N/A;  . Orif wrist fracture  06/17/2012    Procedure: OPEN REDUCTION INTERNAL FIXATION (ORIF) WRIST FRACTURE;  Surgeon: Vickki Hearing, MD;  Location: [facility];  Service: Orthopedics;  Laterality: Left;  . Carpal tunnel release  06/17/2012    Procedure: CARPAL TUNNEL RELEASE;  Surgeon: Vickki Hearing, MD;  Location: [facility];  Service: Orthopedics;  Laterality: Left;    Family History  Problem Relation Age of Onset  . Anesthesia problems Neg Hx   . Hypotension Neg Hx   .  Malignant hyperthermia Neg Hx   . Pseudochol deficiency Neg Hx   . Diabetes      Social History:   Separated from husband and returned to [place] few weeks ago for court date/custody issues. Living with parents and brother. She reports that she has been smoking Cigarettes < 1PPD.  She has a 22 pack-year smoking history. She does not have any smokeless tobacco history on file. She reports that she does not drink alcohol. Uses marijuana occasionally.     Allergies: No Known Allergies   Medications Prior to Admission  Medication Sig Dispense Refill  . acetaminophen (TYLENOL) 500 MG tablet Take 500 mg by mouth every 6 (six) hours as needed for mild pain or moderate pain.      . isosorbide mononitrate (IMDUR) 30 MG 24 hr tablet Take 1 tablet (30 mg total) by mouth daily.  30 tablet  5  . metoprolol tartrate (LOPRESSOR) 25 MG tablet Take 1 tablet (25 mg total) by mouth 2 (two) times daily.  60 tablet  5  . nitroGLYCERIN (NITROSTAT) 0.4 MG SL tablet Place 1 tablet (0.4 mg total) under the tongue every 5 (five) minutes x 3 doses as needed for chest pain.  25 tablet  12  . omeprazole (PRILOSEC) 20 MG capsule Take 20 mg by mouth daily.          Home: Home Living Family/patient expects to be discharged to:: Unsure Living Arrangements: Parent Additional Comments: Per patient, she lived with her parents, however she states (and noted in chart) that they are currently unable to care for her in her current state.    Functional History: Prior Function Level of Independence: Needs assistance;Independent Gait / Transfers Assistance Needed: Prior to all her progressing health issues, pt was (I) with bed moibility skills, transfers, and ambulation skills.  Per chart, pt has been unable to transfer or amb for the last week stating "i can't" Functional Status:  Mobility: Bed Mobility Overal bed mobility: Needs Assistance Bed Mobility: Supine to Sit;Sit to Supine Supine to sit: Mod assist Sit to supine: Mod assist General bed mobility comments: Assist to bring legs off of bed and to raise trunk into sitting. Assist to bring legs back onto bed and control descent of trunk. Transfers Overall transfer level: Needs assistance Equipment used: Rolling walker (2 wheeled) Transfers: Sit to/from Stand Sit to Stand: From elevated surface;Total assist General transfer comment: Attempted x 2 to stand with Stedy and 2 person assist but pt unable due to  weakness. Ambulation/Gait Gait velocity: Unable to attempt at this time.     ADL:    Cognition: Cognition Overall Cognitive Status: Within Functional Limits for tasks assessed Orientation Level: Oriented to person;Oriented to place;Oriented to situation Cognition Arousal/Alertness: Awake/alert Behavior During Therapy: Anxious Overall Cognitive Status: Within Functional Limits for tasks assessed  Blood pressure 139/102, pulse 95, temperature 98 F (36.7 C), temperature source Oral, resp. rate 19, height 5' 4.5" (1.638 m), weight 52 kg (114 lb 10.2 oz), last menstrual period 04/23/2011, SpO2 98.00%. Physical Exam  Nursing note and vitals reviewed. Constitutional: She is oriented to person, place, and time. She appears well-developed.  HENT:  Head: Normocephalic and atraumatic.  Edentulous.   Eyes: Conjunctivae are normal. Pupils are equal, round, and reactive to light.  Neck: Normal range of motion. Neck supple.  Cardiovascular: Normal rate and regular rhythm.   Respiratory: Effort normal and breath sounds normal. No respiratory distress. She has no wheezes.  GI: Soft. Bowel sounds are normal. She  exhibits no distension. There is no tenderness.  Musculoskeletal: She exhibits no edema and no tenderness.  Neurological: She is alert and oriented to person, place, and time.  Speech clear. Hypersensitivity chest wall and dysesthesias RLE>LLE. UES: 3/5 deltoid, bicep, tricep, 3- HI, LE's 1/5 HF, KE and ankles. Decreased sensation in both hand and LE's. DTR's absent. Cognition appropriate   Skin: Skin is warm and dry.  Psychiatric: Her speech is normal and behavior is normal. Thought content normal. Her mood appears anxious. Her affect is blunt. Cognition and memory are normal.    Results for orders placed during the hospital encounter of 01/16/14 (from the past 24 hour(s))  BASIC METABOLIC PANEL     Status: Abnormal   Collection Time    01/23/14  3:08 AM      Result Value Ref Range    Sodium 132 (*) 137 - 147 mEq/L   Potassium 4.2  3.7 - 5.3 mEq/L   Chloride 98  96 - 112 mEq/L   CO2 24  19 - 32 mEq/L   Glucose, Bld 79  70 - 99 mg/dL   BUN 8  6 - 23 mg/dL   Creatinine, Ser 8.650.44 (*) 0.50 - 1.10 mg/dL   Calcium 8.4  8.4 - 78.410.5 mg/dL   GFR calc non Af Amer >90  >90 mL/min   GFR calc Af Amer >90  >90 mL/min   Anion gap 10  5 - 15  CBC     Status: Abnormal   Collection Time    01/23/14  3:08 AM      Result Value Ref Range   WBC 5.0  4.0 - 10.5 K/uL   RBC 2.98 (*) 3.87 - 5.11 MIL/uL   Hemoglobin 9.5 (*) 12.0 - 15.0 g/dL   HCT 69.628.1 (*) 29.536.0 - 28.446.0 %   MCV 94.3  78.0 - 100.0 fL   MCH 31.9  26.0 - 34.0 pg   MCHC 33.8  30.0 - 36.0 g/dL   RDW 13.216.2 (*) 44.011.5 - 10.215.5 %   Platelets 290  150 - 400 K/uL   Mr Cervical Spine W Wo Contrast  01/21/2014   CLINICAL DATA:  Progressive numbness and weakness beginning in the legs and now involving the upper extremities.  EXAM: MRI CERVICAL SPINE WITHOUT AND WITH CONTRAST  TECHNIQUE: Multiplanar and multiecho pulse sequences of the cervical spine, to include the craniocervical junction and cervicothoracic junction, were obtained according to standard protocol without and with intravenous contrast.  CONTRAST:  10mL MULTIHANCE GADOBENATE DIMEGLUMINE 529 MG/ML IV SOLN  COMPARISON:  MRI lumbar spine 01/18/2014.  FINDINGS: Normal signal is present in the cervical and upper thoracic spinal cord to the lowest imaged level, T3-4. Marrow signal, vertebral body heights, and alignment are normal. The postcontrast images demonstrate no pathologic enhancement.  The craniocervical junction is within normal limits. Flow is present in the vertebral arteries.  C2-3:  Negative.  C3-4:  Negative.  C4-5: Mild uncovertebral spurring is worse on the right. There is no significant associated stenosis.  C5-6: A broad-based disc osteophyte complex is asymmetric to the left. Uncovertebral spurring is noted bilaterally. Mild left foraminal narrowing is present.  C6-7: A  shallow central disc protrusion partially effaces the ventral CSF without significant stenosis.  C7-T1:  Negative.  IMPRESSION: 1. Normal MRI appearance of the cervical and upper thoracic spinal cord without pathologic enhancement. 2. Mild left foraminal narrowing at C5-6 due to a leftward disc osteophyte complex and uncovertebral spurring. 3. Mild right-sided uncovertebral spurring  at C4-5 without significant stenosis. 4. Shallow central disc protrusion at C6-7 without significant stenosis.   Electronically Signed   By: Gennette Pac M.D.   On: 01/21/2014 13:31    Assessment/Plan: Diagnosis: GBS 1. Does the need for close, 24 hr/day medical supervision in concert with the patient's rehab needs make it unreasonable for this patient to be served in a less intensive setting? Yes 2. Co-Morbidities requiring supervision/potential complications: substance abuse, UTI 3. Due to bladder management, bowel management, safety, skin/wound care, disease management, medication administration, pain management and patient education, does the patient require 24 hr/day rehab nursing? Yes 4. Does the patient require coordinated care of a physician, rehab nurse, PT (1-2 hrs/day, 5 days/week) and OT (1-2 hrs/day, 5 days/week) to address physical and functional deficits in the context of the above medical diagnosis(es)? Yes Addressing deficits in the following areas: balance, endurance, locomotion, strength, transferring, bowel/bladder control, bathing, dressing, feeding, grooming, toileting and psychosocial support 5. Can the patient actively participate in an intensive therapy program of at least 3 hrs of therapy per day at least 5 days per week? Yes 6. The potential for patient to make measurable gains while on inpatient rehab is excellent 7. Anticipated functional outcomes upon discharge from inpatient rehab are supervision  with PT, supervision with OT, n/a with SLP. 8. Estimated rehab length of stay to reach the above  functional goals is: 16-22 days 9. Does the patient have adequate social supports to accommodate these discharge functional goals? Yes 10. Anticipated D/C setting: Home 11. Anticipated post D/C treatments: HH therapy 12. Overall Rehab/Functional Prognosis: good  RECOMMENDATIONS: This patient's condition is appropriate for continued rehabilitative care in the following setting: CIR Patient has agreed to participate in recommended program. Yes Note that insurance prior authorization may be required for reimbursement for recommended care.  Comment: Rehab Admissions Coordinator to follow up.  Thanks,  [name], MD, [place] [facility]     01/23/2014

## 2014-01-23 NOTE — Progress Notes (Signed)
[place] TEAM 1 - Stepdown/ICU TEAM Progress Note  Crystal Stein YQI:[id number] DOB: Feb 26, 1971 DOA: 01/16/2014 PCP: No PCP Per Patient  Admit HPI / Brief Narrative: 43 year old WF PMHx Anxiety d/o, substance abuse, tobacco abuse, HLD, recently seen at [facility] where she was treated for non-ST elevation MI. She had elevated troponin and underwent cardiac catheterization which did not reveal any significant coronary artery disease and a hyperdynamic EF.  The patient was discharged home and within 4 days return to [facility]. She complained of recurrent chest pain and had elevated troponin. She was seen by cardiology who felt that her elevated troponin was related to her prior MI and did not indicate a new event. Recommendations were to continue the beta blocker and focus on stress management. The patient also described worsening lower extremity weakness and inability to stand. She was able to move her legs in her bed, but was unable to stand.   In the ER an MRI of the lumbar spine did not show any acute abnormalities. She was seen by psychiatry as well as neurology. After being evaluated by neurology, there was concern the patient was developing Guillan Barre syndrome. She underwent lumbar puncture and studies have been sent. She had been started on intravenous immunoglobulin per neurology recommendations. Because there was not neurology service available at [facility] she was transferred to [facility]  HPI/Subjective: A/O x2 (does not know where/when)-endorses throbbing pain in extremities with numbness and tingling  Assessment/Plan:  Guillain Barre syndrome? -Continue IV Ig per neurology x 5 days and if does not improve (has not thus far) Neuro rec consider plasmapheresis -Continue NIF - has remained stable and not indicative of impending respiratory failure -MRI C-spine negative -Myasthenia panel pending -has preserved proximal strength-ESR 70- doubt Polymyositis since myoglobin  normal -CSF preliminary findings nondiagnostic -continue foley for acute urinary retention -begin stool softener- may need daily lax suppository to promote bowel movements -pt had low CD4 count in 2010 with nonreactive HIV and HIV is nonreactive this admit -due to associated pain will dc Vicodin so can continue prn Tylenol and instead use OxyIR - NIF has been stable and no respiratory symptoms  Subclinical hypothyroidism -Free T4 slightly low  -Thyroid antibodies negative -TSH 6.8 -cont Synthroid 25 mcg daily -doubt this is contributing to her current neurological symptoms   Hyponatremia -? Due to volume shifting from IVIG -Urine Na 87-serum and urine osmo pending  Acute encephalopathy -Resolving- patient still mildly confused.  Elevated transaminase -Abdominal ultrasound nondiagnostic see results below -Cautious use of Tylenol- change Vicodin to Oxy IR  Anxiety/depression.  -Significant stress around divorce and custody issues. Continue Valium 5 mg QID PRN -Psychiatry appointment at discharge  Klebsiella UTI -Continue ciprofloxacin  Code Status: FULL Family Communication: no family present at time of exam Disposition Plan: Stepdown until GBS sx's show signs of resolution  Consultants: Dr. Alexis Goodell (neurologist)  Procedure/Significant Events: 7/28 UDS; positive marijuana/benzodiazepine 7/28 PCXR;Old right rib fractures are present. 7/28 CT angiogram chest PE protocol;No pulmonary emboli or other acute abnormalities of the chest.  -New hepatomegaly and hepatic steatosis. 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 Normal MRI appearance of the cervical/ upper thoracic spinal cord  without pathologic enhancement.   Culture  7/30 urine positive Klebsiella pneumoniae 8/1  CSF  Cryptococcal antigen negative  VDRL pending  Culture pending  Antibiotics: Ciprofloxacin 7/31>> IVIG 8/1>>  DVT prophylaxis: Heparin per pharmacy  Objective: VITAL SIGNS: Temp: 97.8 F (36.6 C) (08/04 1200) Temp src: Oral (08/04 1200) BP: 95/70 mmHg (08/04 1200) Pulse Rate: 108 (08/04 1200) SPO2; 98% on room air FIO2:   Intake/Output Summary (Last 24 hours) at 01/23/14 1249 Last data filed at 01/23/14 1100  Gross per 24 hour  Intake 3450.16 ml  Output   1145 ml  Net 2305.16 ml   Exam: General: No acute respiratory distress Lungs: Clear to auscultation bilaterally without wheezes or crackles Cardiovascular: Tachycardic, Regular rhythm without murmur gallop or rub Abdomen: Nontender, nondistended, soft, bowel sounds positive, no rebound, no ascites, no appreciable mass Extremities: No significant cyanosis, clubbing, or edema bilateral lower extremities Neurologic; PERRL, cranial nerves II through XII intact, tongue/uvula midline, asymmetric smile (slight Rt facial droop), moves arms weakly 2-3/5, unable to lift legs off bed but able to briskly wiggle toes- sensation appears intact albeit blunted from toes to hands (Bilat LE; able to feel sharp objects and w/d appropriately; difficulty feeling soft touch) -sitting upright in bed appears to have trunk stability  Data Reviewed: Basic Metabolic Panel:  Recent Labs Lab 01/16/14 1615  01/18/14 0533 01/18/14 0756 01/19/14 0602 01/19/14 1745 01/20/14 0630 01/21/14 0230 01/23/14 0308  NA 135*  < > 139  --  137  --  137 135* 132*  K 3.2*  < > 3.1*  --  6.2* 4.8 3.6* 4.0 4.2  CL 97  < > 107  --  107  --  105 100 98  CO2 18*  < > 21  --  20  --  '22 24 24  ' GLUCOSE 113*  < > 92  --  81  --  101* 92 79  BUN 8  < > 3*  --  <3*  --  <3* 3* 8  CREATININE 0.49*  < > 0.45*  --  0.42*  --  0.45* 0.48* 0.44*  CALCIUM 8.9  < > 7.8*  --   8.2*  --  7.7* 8.5 8.4  MG 1.5  --   --  1.5  --   --   --   --   --   < > = values in this interval not displayed. Liver Function Tests:  Recent Labs Lab 01/17/14 0327 01/18/14 0533 01/19/14 0602 01/20/14 0630  AST 359* 196* 225* 269*  ALT 112* 84* 89* 93*  ALKPHOS 257* 284* 337* 310*  BILITOT 0.9 1.0 1.1 1.0  PROT 5.6* 5.4* 5.9* 5.2*  ALBUMIN 2.1* 2.1* 2.3* 1.9*    Recent Labs Lab 01/19/14 1745  LIPASE 44   No results found for this basename: AMMONIA,  in the last 168 hours CBC:  Recent Labs Lab 01/16/14 1615 01/17/14 0327 01/20/14 0630 01/23/14 0308  WBC 6.9 7.0 5.5 5.0  NEUTROABS 3.3  --   --   --   HGB 12.9 11.0* 10.5* 9.5*  HCT 35.2* 30.8* 30.5* 28.1*  MCV 90.3 91.1 94.4 94.3  PLT 315 288 243 290   Cardiac Enzymes:  Recent Labs Lab 01/16/14 1615 01/16/14 2204 01/17/14 0327 01/17/14 0939  TROPONINI 1.14* 0.75* 0.66* 0.74*   BNP (last 3 results)  Recent Labs  01/11/14 0610  PROBNP 259.3*   CBG: No results found for this basename: GLUCAP,  in the last 168 hours  Recent Results (from the past 240 hour(s))  URINE CULTURE  Status: None   Collection Time    01/18/14  9:25 AM      Result Value Ref Range Status   Specimen Description URINE, CLEAN CATCH   Final   Special Requests NONE   Final   Culture  Setup Time     Final   Value: 01/18/2014 19:44     Performed at [facility]     Final   Value: >=100,000 COLONIES/ML     Performed at [facility]   Culture     Final   Value: KLEBSIELLA PNEUMONIAE     Performed at [facility]   Report Status 01/20/2014 FINAL   Final   Organism ID, Bacteria KLEBSIELLA PNEUMONIAE   Final  CSF CULTURE     Status: None   Collection Time    01/20/14  1:35 PM      Result Value Ref Range Status   Specimen Description CSF   Final   Special Requests NONE   Final   Gram Stain     Final   Value: CYTOSPIN NO WBC SEEN     NO ORGANISMS SEEN     Performed at [facility]   Culture     Final   Value: NO GROWTH 3 DAYS     Performed at [facility]   Report Status 01/23/2014 FINAL   Final     Studies:  Recent x-ray studies have been reviewed in detail by the Attending Physician  Scheduled Meds:  Scheduled Meds: . antiseptic oral rinse  7 mL Mouth Rinse BID  . ciprofloxacin  250 mg Oral BID  . docusate sodium  100 mg Oral BID  . heparin subcutaneous  5,000 Units Subcutaneous 3 times per day  . IMMUNE GLOBULIN 10% (HUMAN) IV - For Fluid Restriction Only  400 mg/kg Intravenous Q24H  . isosorbide mononitrate  60 mg Oral Daily  . levothyroxine  25 mcg Oral QAC breakfast  . metoprolol succinate  100 mg Oral Daily  . pantoprazole  40 mg Oral Daily    Time spent on care of this patient: 35 mins   ELLIS,ALLISON L. , ANP   [facility] Office  [phone] Pager - [phone]  On-Call/Text Page:      [email or web address]      password [email or web address]  If 7PM-7AM, please contact night-coverage [email or web address] Password [email or web address] 01/23/2014, 12:49 PM   LOS: 7 days   Examining patient and discussed assessment and plan with ANP Ebony Hail and agree with the above. Patient with multiple complex medical problems per patient care>35 min

## 2014-01-24 LAB — COMPREHENSIVE METABOLIC PANEL
ALBUMIN: 2 g/dL — AB (ref 3.5–5.2)
ALK PHOS: 229 U/L — AB (ref 39–117)
ALT: 42 U/L — ABNORMAL HIGH (ref 0–35)
AST: 87 U/L — ABNORMAL HIGH (ref 0–37)
Anion gap: 11 (ref 5–15)
BILIRUBIN TOTAL: 0.8 mg/dL (ref 0.3–1.2)
BUN: 9 mg/dL (ref 6–23)
CHLORIDE: 98 meq/L (ref 96–112)
CO2: 26 mEq/L (ref 19–32)
CREATININE: 0.48 mg/dL — AB (ref 0.50–1.10)
Calcium: 8.7 mg/dL (ref 8.4–10.5)
GLUCOSE: 88 mg/dL (ref 70–99)
POTASSIUM: 4 meq/L (ref 3.7–5.3)
Sodium: 135 mEq/L — ABNORMAL LOW (ref 137–147)
Total Protein: 7.5 g/dL (ref 6.0–8.3)

## 2014-01-24 LAB — VDRL, CSF: SYPHILIS VDRL QUANT CSF: NONREACTIVE

## 2014-01-24 NOTE — PMR Pre-admission (Signed)
PMR Admission Coordinator Pre-Admission Assessment  Patient: Crystal Stein is an 43 y.o., female MRN: [id number] DOB: 07-09-1970 Height: 5' 4.5" (163.8 cm) Weight: 49.6 kg (109 lb 5.6 oz)              Insurance Information Self pay - no insurance coverage  Medicaid Application Date:        Case Manager:   Disability Application Date:        Case Worker:    Emergency Contact Information Contact Information   Name Relation Home Work Mobile   [place] Father [phone]     Namon Cirri Mother   [phone]     Current Medical History  Patient Admitting Diagnosis:  GBS  History of Present Illness: A 43 y.o. female with history of depression with anxiety, recent NSTEMI, who was readmitted on 01/16/14 with recurrent chest pain as well as confusion. CTA chest negative for PE and new hepatomegaly and hepatic steatosis noted. UDS positive for marijuana and benzos. Cardiology consulted and felt that symptoms due to psychosocial issues. Patient reported difficulty walking and well as BLE weakness on 07/29 and MRI lumbar spine without disc herniation or stenosis. 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 She was started on IVIG for treatment and has had complaints of worsening of numbness. Patient with stable NIF/VC but with elevated ESR. HD cath was placed by IVR and she was started on PLEX due to minimal response with IVIG.  Patient received first attempted PLEX on 01/29/14, but it had to be stopped prior to completion.  Her second PLEX treatment is scheduled for 01/31/14.   Confusion resolving. Kleb UTI treated with cipro and foley placed due to urinary retention. PT initiated and patient limited by significant weakness. CIR recommended by MD and rehab team.     Past Medical History  Past Medical History  Diagnosis Date  . Anxiety   . Depression   . Asthma   . 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 COPD (chronic obstructive pulmonary disease)   . GERD (gastroesophageal reflux disease)   . Bipolar disorder   . Blood transfusion 2006  . History of cardiac catheterization July 2015    Angiographically normal coronary arteries with distal tortuosity  . NSTEMI (non-ST elevated myocardial infarction)     Not secondary to obstructive CAD or obvious plaque rupture, possibly vasospasm    Family History  family history includes Diabetes in an other family member. There is no history of Anesthesia problems, Hypotension, Malignant hyperthermia, or Pseudochol deficiency.  Prior Rehab/Hospitalizations:  Had outpatient therapy about 5 yrs ago for "fractures".   Current Medications  Current facility-administered medications:acetaminophen (TYLENOL) tablet 500 mg, 500 mg, Oral, Q6H PRN, Doree Albee, MD, 500 mg at 01/29/14 0039;  antiseptic oral rinse (CPC / CETYLPYRIDINIUM CHLORIDE 0.05%) solution 7 mL, 7 mL, Mouth Rinse, BID, Allie Bossier, MD, 7 mL at 01/31/14 1000;  calcium gluconate 4 g in sodium chloride 0.9 % 250 mL IVPB, 4 g, Intravenous, Once, Roland Rack, MD diazepam (VALIUM) tablet 5 mg, 5 mg, Oral, Q6H PRN, Kathie Dike, MD, 5 mg at 01/30/14 2152;  docusate sodium (COLACE) capsule 100 mg, 100 mg, Oral, BID, Samella Parr, NP, 100 mg at 01/31/14 0943;  gabapentin (NEURONTIN) capsule  300 mg, 300 mg, Oral, TID, Roland Rack, MD, 300 mg at 01/31/14 0943;  heparin injection 5,000 Units, 5,000 Units, Subcutaneous, 3 times per day, Georgina Peer, RPH, 5,000 Units at 01/31/14 0531 isosorbide mononitrate (IMDUR) 24 hr tablet 60 mg, 60 mg, Oral, Daily, Cherene Altes, MD, 60 mg at 01/31/14 4431;  levothyroxine (SYNTHROID, LEVOTHROID) tablet 25 mcg, 25 mcg, Oral, QAC breakfast, Allie Bossier, MD, 25 mcg at  01/31/14 [phone];  metoprolol succinate (TOPROL-XL) 24 hr tablet 100 mg, 100 mg, Oral, Daily, Cherene Altes, MD, 100 mg at 01/31/14 0943 nitroGLYCERIN (NITROSTAT) SL tablet 0.4 mg, 0.4 mg, Sublingual, Q5 Min x 3 PRN, Nimish C Gosrani, MD;  ondansetron (ZOFRAN) injection 4 mg, 4 mg, Intravenous, Q6H PRN, Nimish C Gosrani, MD, 4 mg at 01/19/14 1654;  ondansetron (ZOFRAN) tablet 4 mg, 4 mg, Oral, Q6H PRN, Doree Albee, MD, 4 mg at 01/27/14 0827;  oxyCODONE (Oxy IR/ROXICODONE) immediate release tablet 5-10 mg, 5-10 mg, Oral, Q6H PRN, Samella Parr, NP, 10 mg at 01/31/14 0530 pantoprazole (PROTONIX) EC tablet 40 mg, 40 mg, Oral, Daily, Nimish C Gosrani, MD, 40 mg at 01/31/14 0943;  polyethylene glycol (MIRALAX / GLYCOLAX) packet 17 g, 17 g, Oral, Daily, Charlynne Cousins, MD, 17 g at 01/30/14 8676  Patients Current Diet: General  Precautions / Restrictions Precautions Precautions: Fall Precaution Comments: reports a fall recently; unable to state when the fall was Restrictions Weight Bearing Restrictions: No   Prior Activity Level Community (5-7x/wk): Went out daily.  Did not work.  Did drive.  Home Assistive Devices / Equipment Home Assistive Devices/Equipment: None  Prior Functional Level Prior Function Level of Independence: Independent Gait / Transfers Assistance Needed: independent with progressive weekness with x1 week PTA of bed level care  Current Functional Level Cognition  Overall Cognitive Status: Impaired/Different from baseline Orientation Level: Oriented X4    Extremity Assessment (includes Sensation/Coordination)  Lower Extremity Assessment: Generalized weakness;RLE deficits/detail;LLE deficits/detail  RLE Deficits / Details: Limited testing seconary to complaints of severe pain in (B) legs. Pt was able to actively complete heel slides to put on socks without limitations.  LLE Deficits / Details: Limited testing secondary to complaints of severe pain in (B) legs.  Pt was able to actively complete heel slides to put on socks without limitations.    ADLs  Overall ADL's : Needs assistance/impaired Eating/Feeding: Moderate assistance;Sitting;Bed level Eating/Feeding Details (indicate cue type and reason): Pt with wrist cock up splint on Rt UE and universal cuff placed in palm Pt is able to use shoulder flexion to 90 degrees to scoop food against bowel and bring to mouth. pt requires shoulder elevation to scoop food; Pt reports fatigue at shoulder with efforts to self feed. Pt spilling jello but able to self feed 50% of container without (A) min guard. Pt requires universal cuff to complete any feeding. Pt demonstrates LT hand (A)  durnig session to stabilize Rt Grooming: Wash/dry face;Minimal assistance Grooming Details (indicate cue type and reason): Pt sitting in stedy at sink level to wash face and complete oral care. Pt demonstrates good core strength  Toilet Transfer: +2 for physical assistance;Maximal assistance Functional mobility during ADLs: +2 for physical assistance;Maximal assistance (stedy ) General ADL Comments: Pt remained bed level due to fatigue. Pt session focused on AE use and self feeding with AE. pt compensating with Lt lateral lean to help scoop Rt UE with cuff    Mobility  Overal bed mobility: Needs Assistance Bed Mobility: Supine  to Sit Rolling: Max assist Sidelying to sit: Mod assist Supine to sit: Max assist;HOB elevated Sit to supine: Mod assist Sit to sidelying: Mod assist;+2 for physical assistance General bed mobility comments: pt attempted to grip handrails with Rt UE and (A) with bed mobility but was limited by pain; minimally moves bil LEs but with ataxic like movements; use of draw pad to bring hips and trunk to upright sitting position     Transfers  Overall transfer level: Needs assistance Equipment used: 2 person hand held assist Transfers: Sit to/from [employer] Transfers Sit to Stand: Max assist;+2 physical  assistance Stand pivot transfers: Max assist;+2 physical assistance General transfer comment: requires bil LEs blocking to prevent buckling; cues to lean anteriorly; pt tends to push posteriorly and states "i just dont want to fall" pt very anxious of transfers due to recent falls; max encouragement throughout session and pt was able to lean anteriorly and did not resist transfer    Ambulation / Gait / Stairs / Wheelchair Mobility  Ambulation/Gait Gait velocity: Unable to attempt at this time.  General Gait Details: unablet o ambulate; difficulty weightbearing; bil knees buckling     Posture / Balance Dynamic Sitting Balance Sitting balance - Comments: progressed to supervision while sitting EOB pt unable to WB through bil UEs due to pain in wrists; tolerated sitting EOB ~10 min; no c/o dizziness; able to raise shoulders out BOS minimally but required min guard with dynamic activities     Special needs/care consideration BiPAP/CPAP No CPM No Continuous Drip IV No Dialysis No        Days  Life Vest No Oxygen No Special Bed No Trach Size No Wound Vac (area) No      Skin Bruising arms and groin                      Bowel mgmt: Documented bowel incontinence on 01/30/14 Bladder mgmt: Foley catheter for urinary retention Diabetic mgmt No    Previous Home Environment Living Arrangements: Parent Home Care Services: No Additional Comments: Pt currently staying at parents house due to custody issues with children. pt plans to return to [place] residence at some point.   Discharge Living Setting Plans for Discharge Living Setting: House;Lives with (comment) (Currently living with parents.) Type of Home at Discharge: House Discharge Home Layout: One level Discharge Home Access: Stairs to enter Entrance Stairs-Number of Steps: 4-5 step entry. Does the patient have any problems obtaining your medications?: No  Social/Family/Support Systems Patient Roles: Spouse;Parent (Separated  from husband and has 3 children.) Contact Information: Bertram [place] - father [phone] Anticipated Caregiver: Mom can provide supervision and get patient something to eat Anticipated Caregiver's Contact Information: Mikle Bosworth - mom [phone] Ability/Limitations of Caregiver: Mom not working and can provide supervision but no physical assistance.  Dad works. Caregiver Availability: 24/7 Discharge Plan Discussed with Primary Caregiver: Yes Is Caregiver In Agreement with Plan?: Yes Does Caregiver/Family have Issues with Lodging/Transportation while Pt is in Rehab?: No  Goals/Additional Needs Patient/Family Goal for Rehab: PT/OT Supervision Expected length of stay: 16-22 days Cultural Considerations: None Dietary Needs: Heart diet, thin liquids Equipment Needs: TBD Additional Information: Husband has custody of children.  Has a 41 yo son, 56 yo daughter and a 81 yo daughter.  [place]. Pt/Family Agrees to Admission and willing to participate: Yes Program Orientation Provided & Reviewed with Pt/Caregiver Including Roles  & Responsibilities: Yes  Decrease burden  of Care through IP rehab admission:  N/A  Possible need for SNF placement upon discharge: Not planned, but if no progress, may need SNF prior to home with parents.  Expect patient will need SNF if she does not progress to supervision level by the time of discharge.  Patient Condition: This patient's medical and functional status has changed since the consult dated:01/23/14 in which the Rehabilitation Physician determined and documented that the patient's condition is appropriate for intensive rehabilitative care in an inpatient rehabilitation facility. See "History of Present Illness" (above) for medical update. Functional changes are: Currently requiring max assist +2 for stand pivot transfers.  Unable to ambulate due to bil. Knees buckling. Patient's medical and functional status update has  been discussed with the Rehabilitation physician and patient remains appropriate for inpatient rehabilitation. Will admit to inpatient rehab today.  Preadmission Screen Completed By:  Retta Diones, 01/31/2014 11:32 AM ______________________________________________________________________   Discussed status with Dr. Letta Pate on 01/31/14 at 1139 and received telephone approval for admission today.  Admission Coordinator:  Retta Diones, time1139/Date08/12/15

## 2014-01-24 NOTE — Progress Notes (Signed)
Immune globulin completed, no complaints presented. V/s taken without changes.

## 2014-01-24 NOTE — Progress Notes (Signed)
Subjective: Continues to be weak. She is unsure if she is getting worse or not.   Exam: Filed Vitals:   01/24/14 0759  BP: 132/93  Pulse: 102  Temp: 98.3 F (36.8 C)  Resp:    Gen: In bed, NAD MS: Awake, alert, oriented to person, place, year JW:[id number]:PERRL, EOMI without diploplia, face symmetric.  Motor: 4/5 strength in a proximal stronger than distal distribution in the upper extremities, 4-/5 wrist extension. She has 3/5 ankle dorsiflexiona and 1/5 plantar flexion. 3/5 hip flesion.  Sensory:decreased below the mid-forearm bilaterally. Absent vibration to the wrists bilaterally, decreased but present temperature and touch sensation.  DTR:She has a right biceps reflex, decreased left biceps, absent LE reflexes.    Impression: 43 yo F with a length dependant loss of large fiber > small fiber function consistent with an acute neuropathy such as AIDP. With no pleocytosis on LP, I feel that this is the most likely diagnosis. She had no elevation in protein, however early on in GBS, this can be negative.   Recommendations: 1) Continue IVIG, day 5 today.  2) would favor continued observation for a few days post-dose to see if the patient begins to have an improvement. If no response, or if continues worsening, then may need to consider further therapy.  3) will continue to follow.    Ritta SlotMcNeill Ifeoma Vallin, MD [facility] [phone]  If 7pm- 7am, please page neurology on call as listed in AMION.

## 2014-01-24 NOTE — Progress Notes (Signed)
NIF -40   FVC 1.9L  Patient shows some discomfort but has good effort with measurements.

## 2014-01-24 NOTE — Progress Notes (Addendum)
NIF:  -40 VC :  1.6L Pt preformed with good effort

## 2014-01-24 NOTE — Progress Notes (Signed)
NIF = -38, VC 1.85L w/ good pt effort x 2 attempts.

## 2014-01-24 NOTE — Progress Notes (Signed)
NIF -44, VC 2.0 L w/ good pt effort.

## 2014-01-24 NOTE — Progress Notes (Signed)
[place] TEAM 1 - Stepdown/ICU TEAM Progress Note  Crystal Stein ZOX:[id number] DOB: 17-Feb-1971 DOA: 01/16/2014 PCP: No PCP Per Patient  Admit HPI / Brief Narrative: 43 year old WF PMHx Anxiety d/o, substance abuse, tobacco abuse, HLD, recently seen at [facility] where she was treated for non-ST elevation MI. She had elevated troponin and underwent cardiac catheterization which did not reveal any significant coronary artery disease and a hyperdynamic EF.  The patient was discharged home and within 4 days return to [facility]. She complained of recurrent chest pain and had elevated troponin. She was seen by cardiology who felt that her elevated troponin was related to her prior MI and did not indicate a new event. Recommendations were to continue the beta blocker and focus on stress management. The patient also described worsening lower extremity weakness and inability to stand. She was able to move her legs in her bed, but was unable to stand.   In the ER an MRI of the lumbar spine did not show any acute abnormalities. She was seen by psychiatry as well as neurology. After being evaluated by neurology, there was concern the patient was developing Guillan Barre syndrome. She underwent lumbar puncture and studies have been sent. She had been started on intravenous immunoglobulin per neurology recommendations. Because there was not neurology service available at [facility] she was transferred to [facility].  HPI/Subjective: Awake and endorses continued numbness and throbbing pain in extremities  Assessment/Plan:  Guillain Barre syndrome? -Continue IV Ig per neurology x 5 days (today is day 5) -on exam seems to demonstrate slow but improved extremity strength -Continue NIF - has remained stable and not indicative of impending respiratory failure -MRI C-spine negative -Myasthenia panel pending -has preserved proximal strength-ESR 70- doubt Polymyositis since myoglobin normal -CSF preliminary  findings nondiagnostic -continue foley for acute urinary retention -begin stool softener- may need daily lax suppository to promote bowel movements -pt had low CD4 count in 2010 with nonreactive HIV and HIV is nonreactive this admit  Subclinical hypothyroidism -Free T4 slightly low  -Thyroid antibodies negative -TSH 6.8 -cont Synthroid 25 mcg daily -doubt this is contributing to her current neurological symptoms   Hyponatremia -Urine Na 87-serum and urine osmo low -follow  Acute encephalopathy -Resolving- patient still occasionally confused.  Elevated transaminase -Abdominal ultrasound nondiagnostic  -Cautious use of Tylenol- change Vicodin to Oxy IR -Hepatitis panel neg  Anxiety/depression.  -Significant stress around divorce and custody issues. Continue Valium 5 mg QID PRN -Psychiatry appointment at discharge  Klebsiella UTI -Continue ciprofloxacin  Code Status: FULL Family Communication: no family present at time of exam Disposition Plan: Stepdown until GBS sx's show signs of resolution-will need eventual rehab before dc home- CIR following for possible admit  Consultants: Dr. Alexis Goodell (neurologist)  Procedure/Significant Events: 7/28 UDS; positive marijuana/benzodiazepine 7/28 PCXR;Old right rib fractures are present. 7/28 CT angiogram chest PE protocol;No pulmonary emboli or other acute abnormalities of the chest.  -New hepatomegaly and hepatic steatosis. 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 Normal MRI appearance of the cervical/ upper thoracic spinal cord without pathologic enhancement.   Antibiotics: Ciprofloxacin 7/31>> IVIG 8/1>>  DVT prophylaxis: SQ  heparin   Objective: VITAL SIGNS: Temp: 98.3 F (36.8 C) (08/05 1212) Temp src: Oral (08/05 1212) BP: 110/65 mmHg (  08/05 1200) Pulse Rate: 106 (08/05 1200) SPO2; 98% on room air FIO2:  Intake/Output Summary (Last 24 hours) at 01/24/14 1234 Last data filed at 01/24/14 0900  Gross per 24 hour  Intake    920 ml  Output   1475 ml  Net   -555 ml   Exam: General: No acute respiratory distress Lungs: Clear to auscultation bilaterally without wheezes or crackles Cardiovascular: Tachycardic, Regular rhythm without murmur gallop or rub Abdomen: Nontender, nondistended, soft, bowel sounds positive, no rebound, no ascites, no appreciable mass Extremities: No significant cyanosis, clubbing, or edema bilateral lower extremities Neurologic; moves arms and legs weakly (4/5), able to somewhat lift thighs/knees partially off bed- sensation appears intact albeit blunted from toes to hands-proximal strength > distal strength  Data Reviewed: Basic Metabolic Panel:  Recent Labs Lab 01/18/14 0756 01/19/14 0602 01/19/14 1745 01/20/14 0630 01/21/14 0230 01/23/14 0308 01/24/14 0250  NA  --  137  --  137 135* 132* 135*  K  --  6.2* 4.8 3.6* 4.0 4.2 4.0  CL  --  107  --  105 100 98 98  CO2  --  20  --  '22 24 24 26  ' GLUCOSE  --  81  --  101* 92 79 88  BUN  --  <3*  --  <3* 3* 8 9  CREATININE  --  0.42*  --  0.45* 0.48* 0.44* 0.48*  CALCIUM  --  8.2*  --  7.7* 8.5 8.4 8.7  MG 1.5  --   --   --   --   --   --    Liver Function Tests:  Recent Labs Lab 01/18/14 0533 01/19/14 0602 01/20/14 0630 01/24/14 0250  AST 196* 225* 269* 87*  ALT 84* 89* 93* 42*  ALKPHOS 284* 337* 310* 229*  BILITOT 1.0 1.1 1.0 0.8  PROT 5.4* 5.9* 5.2* 7.5  ALBUMIN 2.1* 2.3* 1.9* 2.0*    Recent Labs Lab 01/19/14 1745  LIPASE 44   CBC:  Recent Labs Lab 01/20/14 0630 01/23/14 0308  WBC 5.5 5.0  HGB 10.5* 9.5*  HCT 30.5* 28.1*  MCV 94.4 94.3  PLT 243 290    Recent Results (from the past 240  hour(s))  URINE CULTURE     Status: None   Collection Time    01/18/14  9:25 AM      Result Value Ref Range Status   Specimen Description URINE, CLEAN CATCH   Final   Special Requests NONE   Final   Culture  Setup Time     Final   Value: 01/18/2014 19:44     Performed at [facility]     Final   Value: >=100,000 COLONIES/ML     Performed at [facility]   Culture     Final   Value: KLEBSIELLA PNEUMONIAE     Performed at [facility]   Report Status 01/20/2014 FINAL   Final   Organism ID, Bacteria KLEBSIELLA PNEUMONIAE   Final  CSF CULTURE     Status: None   Collection Time    01/20/14  1:35 PM      Result Value Ref Range Status   Specimen Description CSF   Final   Special Requests NONE   Final   Gram Stain     Final   Value: CYTOSPIN NO WBC SEEN     NO ORGANISMS SEEN     Performed at [facility]  Culture     Final   Value: NO GROWTH 3 DAYS     Performed at [facility]   Report Status 01/23/2014 FINAL   Final     Studies:  Recent x-ray studies have been reviewed in detail by the Attending Physician  Scheduled Meds:  Scheduled Meds: . antiseptic oral rinse  7 mL Mouth Rinse BID  . ciprofloxacin  250 mg Oral BID  . docusate sodium  100 mg Oral BID  . heparin subcutaneous  5,000 Units Subcutaneous 3 times per day  . IMMUNE GLOBULIN 10% (HUMAN) IV - For Fluid Restriction Only  400 mg/kg Intravenous Q24H  . isosorbide mononitrate  60 mg Oral Daily  . levothyroxine  25 mcg Oral QAC breakfast  . metoprolol succinate  100 mg Oral Daily  . pantoprazole  40 mg Oral Daily    Time spent on care of this patient: 35 mins   ELLIS,ALLISON L. , ANP   [facility] Office  [phone] Pager - [phone]  On-Call/Text Page:      [email or web address]      password [email or web address]  If 7PM-7AM, please contact night-coverage [email or web address] Password [email or web address] 01/24/2014, 12:34 PM   LOS: 8 days   I have personally examined this patient  and reviewed the entire database. I have reviewed the above note, made any necessary editorial changes, and agree with its content.  Cherene Altes, MD [facility]

## 2014-01-24 NOTE — Progress Notes (Signed)
Rehab admissions - Evaluated for possible admission.  I met with patient and gave her rehab handbooks.  She is interested in inpatient rehab admission.  Once all tests, procedures and treatments are complete, can consider inpatient rehab admission.  Noted possible need for plasmapheresis if no improvement with IVIG.  Will follow along for progress.  Call me for questions.  #[phone]

## 2014-01-25 DIAGNOSIS — I10 Essential (primary) hypertension: Secondary | ICD-10-CM

## 2014-01-25 DIAGNOSIS — E039 Hypothyroidism, unspecified: Secondary | ICD-10-CM

## 2014-01-25 DIAGNOSIS — E871 Hypo-osmolality and hyponatremia: Secondary | ICD-10-CM

## 2014-01-25 LAB — BASIC METABOLIC PANEL
ANION GAP: 12 (ref 5–15)
BUN: 11 mg/dL (ref 6–23)
CO2: 24 meq/L (ref 19–32)
CREATININE: 0.4 mg/dL — AB (ref 0.50–1.10)
Calcium: 8.5 mg/dL (ref 8.4–10.5)
Chloride: 95 mEq/L — ABNORMAL LOW (ref 96–112)
Glucose, Bld: 84 mg/dL (ref 70–99)
Potassium: 3.8 mEq/L (ref 3.7–5.3)
SODIUM: 131 meq/L — AB (ref 137–147)

## 2014-01-25 LAB — CBC
HEMATOCRIT: 29.9 % — AB (ref 36.0–46.0)
Hemoglobin: 10.1 g/dL — ABNORMAL LOW (ref 12.0–15.0)
MCH: 32.1 pg (ref 26.0–34.0)
MCHC: 33.8 g/dL (ref 30.0–36.0)
MCV: 94.9 fL (ref 78.0–100.0)
PLATELETS: 325 10*3/uL (ref 150–400)
RBC: 3.15 MIL/uL — ABNORMAL LOW (ref 3.87–5.11)
RDW: 16.8 % — ABNORMAL HIGH (ref 11.5–15.5)
WBC: 6.2 10*3/uL (ref 4.0–10.5)

## 2014-01-25 MED ORDER — DEXTROSE 5 % IV SOLN: INTRAVENOUS | Status: DC

## 2014-01-25 NOTE — Progress Notes (Signed)
NIF:    -44 VC: 2L Good patient effort.

## 2014-01-25 NOTE — Progress Notes (Signed)
Physical Therapy Treatment Patient Details Name: Crystal Stein MRN: [id number] DOB: January 08, 1971 Today's Date: 01/25/2014    History of Present Illness  43 yo female admitted for MI and Guillan Barre. Pt recently admitted for chest pain 5 days PTA. PMH: anxiety/depression , bipolar disorder and smokes cigarettes    PT Comments    Pt progressing slowly towards physical therapy goals. Was able to use the [facility] for transfer bed>recliner, however unable to hold static standing as long as last session. Decreased grip strength limiting amount of UE support during pull to stand. Spoke with RN regarding prafo boots as pt is having difficulty maintaining neutral positioning of ankles at rest.   Follow Up Recommendations  CIR     Equipment Recommendations  Wheelchair (measurements PT);Wheelchair cushion (measurements PT);Rolling walker with 5" wheels    Recommendations for Other Services       Precautions / Restrictions Precautions Precautions: Fall Restrictions Weight Bearing Restrictions: No    Mobility  Bed Mobility Overal bed mobility: Needs Assistance Bed Mobility: Rolling;Sidelying to Sit Rolling: Mod assist Sidelying to sit: Mod assist       General bed mobility comments: Pt with heavy use of bed rail and pushing up with Lt UE  Transfers Overall transfer level: Needs assistance Equipment used:  Antony Salmon) Transfers: Sit to/from Stand Sit to Stand: Max assist;+2 physical assistance;+2 safety/equipment         General transfer comment: Pt required +2 assist to stand and extend hips enough to turn down seat flaps on [place] for pt to sit. Pt with difficulty pulling up to stand due to poor grip strength.   Ambulation/Gait             General Gait Details: Unable    Stairs            Wheelchair Mobility    Modified Rankin (Stroke Patients Only)       Balance Overall balance assessment: Needs assistance Sitting-balance support: Bilateral upper extremity  supported Sitting balance-Leahy Scale: Fair     Standing balance support: Bilateral upper extremity supported Standing balance-Leahy Scale: Zero                      Cognition Arousal/Alertness: Awake/alert Behavior During Therapy: WFL for tasks assessed/performed Overall Cognitive Status: Within Functional Limits for tasks assessed                      Exercises General Exercises - Lower Extremity Ankle Circles/Pumps: 15 reps Long Arc Quad: 10 reps Heel Slides: 5 reps Other Exercises Other Exercises: With knee bent, eccentric hip ER and concentric IR back to midline. x5 each side.     General Comments        Pertinent Vitals/Pain Pain Assessment: Faces Faces Pain Scale: Hurts even more Pain Location: LE's Pain Intervention(s): Limited activity within patient's tolerance;Repositioned    Home Living                      Prior Function            PT Goals (current goals can now be found in the care plan section) Acute Rehab PT Goals Patient Stated Goal: To stand independently PT Goal Formulation: With patient Time For Goal Achievement: 02/05/14 Potential to Achieve Goals: Fair Progress towards PT goals: Progressing toward goals    Frequency  Min 3X/week    PT Plan Current plan remains appropriate    Co-evaluation  End of Session Equipment Utilized During Treatment: Gait belt Activity Tolerance: Patient limited by fatigue Patient left: in chair;with call bell/phone within reach     Time: 1457-1528 PT Time Calculation (min): 31 min  Charges:  $Therapeutic Activity: 23-37 mins                    G Codes:      Ruthann CancerHamilton, Declan Adamson 01/25/2014, 4:31 PM  Ruthann CancerLaura Hamilton, PT, DPT Acute Rehabilitation Services Pager: [phone]

## 2014-01-25 NOTE — Progress Notes (Signed)
[place] TEAM 1 - Stepdown/ICU TEAM Progress Note  AZENETH CARBONELL WTU:[id number] DOB: Sep 23, 1970 DOA: 01/16/2014 PCP: No PCP Per Patient  Admit HPI / Brief Narrative: 43 year old WF PMHx Anxiety d/o, substance abuse, tobacco abuse, HLD, recently seen at [facility] where she was treated for non-ST elevation MI. She had elevated troponin and underwent cardiac catheterization which did not reveal any significant coronary artery disease and a hyperdynamic EF.  The patient was discharged home and within 4 days return to [facility]. She complained of recurrent chest pain and had elevated troponin. She was seen by cardiology who felt that her elevated troponin was related to her prior MI and did not indicate a new event. Recommendations were to continue the beta blocker and focus on stress management. The patient also described worsening lower extremity weakness and inability to stand. She was able to move her legs in her bed, but was unable to stand.   In the ER an MRI of the lumbar spine did not show any acute abnormalities. She was seen by psychiatry as well as neurology. After being evaluated by neurology, there was concern the patient was developing Guillan Barre syndrome. She underwent lumbar puncture and studies have been sent. She had been started on intravenous immunoglobulin per neurology recommendations. Because there was not neurology service available at [facility] she was transferred to [facility].  HPI/Subjective: Awake and endorses improved pain control   Assessment/Plan:  Guillain Barre syndrome? -Completed 5 days of IV Ig-per neuro follow -strength continues to improve especially in arms  -Continue NIF - has remained stable and not indicative of impending respiratory failure -MRI C-spine negative -Myasthenia panel pending -Neuro also checking heavy metal tox screen -has preserved proximal strength-ESR 70- doubt Polymyositis since myoglobin normal -CSF nondiagnostic but per  neuro when GBS early stage protein can be normal and not elevated as expected -continue foley for acute urinary retention -begin stool softener- may need daily lax suppository to promote bowel movements -pt had low CD4 count in 2010 with nonreactive HIV and HIV is nonreactive this admit  Subclinical hypothyroidism -Free T4 slightly low  -Thyroid antibodies negative -TSH 6.8 -cont Synthroid 25 mcg daily -doubt this is contributing to her current neurological symptoms   Hyponatremia -Urine Na 87-serum and urine osmo low -Na has decreased further and BP lower so begin NS   HTN -better controlled now that pain controlled -cont Imdur and Toprol  Acute encephalopathy -Resolving- patient still occasionally confused.  Elevated transaminase -Abdominal ultrasound nondiagnostic  -Cautious use of Tylenol- change Vicodin to Oxy IR -Hepatitis panel neg  Anxiety/depression.  -Significant stress around divorce and custody issues. Continue Valium 5 mg QID PRN -Psychiatry appointment at discharge  Klebsiella UTI -Continue ciprofloxacin  Code Status: FULL Family Communication: no family present at time of exam Disposition Plan: Stepdown until GBS sx's show signs of resolution-will need prolonged rehab before dc home- CIR following for possible admit  Consultants: Dr. Alexis Goodell (neurologist)  Procedure/Significant Events: 7/28 UDS; positive marijuana/benzodiazepine 7/28 PCXR;Old right rib fractures are present. 7/28 CT angiogram chest PE protocol;No pulmonary emboli or other acute abnormalities of the chest.  -New hepatomegaly and hepatic steatosis. 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  Normal MRI appearance of the cervical/ upper thoracic spinal cord without  pathologic enhancement.   Antibiotics: Ciprofloxacin 7/31>> IVIG 8/1>>  DVT prophylaxis: SQ heparin   Objective: VITAL SIGNS: Temp: 98.5 F (36.9 C) (08/06 1125) Temp src: Oral (08/06 1125) BP: 92/65 mmHg (08/06 1125) Pulse Rate: 102 (08/06 1125) SPO2; 98% on room air FIO2:  Intake/Output Summary (Last 24 hours) at 01/25/14 1152 Last data filed at 01/25/14 1100  Gross per 24 hour  Intake  637.5 ml  Output    750 ml  Net -112.5 ml   Exam: General: No acute respiratory distress Lungs: Clear to auscultation bilaterally without wheezes or crackles Cardiovascular: Tachycardic, Regular rhythm without murmur gallop or rub Abdomen: Nontender, nondistended, soft, bowel sounds positive, no rebound, no ascites, no appreciable mass Extremities: No significant cyanosis, clubbing, or edema bilateral lower extremities Neurologic; moves arms (4/5 with left stronger than right) and legs weakly (3/5), able to somewhat lift thighs/knees partially off bed- sensation appears intact albeit blunted from toes to hands-proximal strength > distal strength  Data Reviewed: Basic Metabolic Panel:  Recent Labs Lab 01/20/14 0630 01/21/14 0230 01/23/14 0308 01/24/14 0250 01/25/14 0306  NA 137 135* 132* 135* 131*  K 3.6* 4.0 4.2 4.0 3.8  CL 105 100 98 98 95*  CO2 _0 GLUCOSE 101* 92 79 88 84  BUN <3* 3* _1 CREATININE 0.45* 0.48* 0.44* 0.48* 0.40*  CALCIUM 7.7* 8.5 8.4 8.7 8.5   Liver Function Tests:  Recent Labs Lab 01/19/14 0602 01/20/14 0630 01/24/14 0250  AST 225* 269* 87*  ALT 89* 93* 42*  ALKPHOS 337* 310* 229*  BILITOT 1.1 1.0 0.8  PROT 5.9* 5.2* 7.5  ALBUMIN 2.3* 1.9* 2.0*    Recent Labs Lab 01/19/14 1745  LIPASE 44   CBC:  Recent Labs Lab 01/20/14 0630 01/23/14 0308 01/25/14 0306  WBC 5.5 5.0 6.2  HGB 10.5* 9.5* 10.1*  HCT 30.5* 28.1* 29.9*  MCV 94.4 94.3 94.9  PLT 243  290 325    Recent Results (from the past 240 hour(s))  URINE CULTURE     Status: None   Collection Time    01/18/14  9:25 AM      Result Value Ref Range Status   Specimen Description URINE, CLEAN CATCH   Final   Special Requests NONE   Final   Culture  Setup Time     Final   Value: 01/18/2014 19:44     Performed at [facility]     Final   Value: >=100,000 COLONIES/ML     Performed at [facility]   Culture     Final   Value: KLEBSIELLA PNEUMONIAE     Performed at [facility]   Report Status 01/20/2014 FINAL   Final   Organism ID, Bacteria KLEBSIELLA PNEUMONIAE   Final  CSF CULTURE     Status: None   Collection Time    01/20/14  1:35 PM      Result Value Ref Range Status   Specimen Description CSF   Final   Special Requests NONE   Final   Gram Stain     Final   Value: CYTOSPIN NO WBC SEEN     NO ORGANISMS SEEN     Performed at [facility]   Culture     Final   Value: NO GROWTH 3 DAYS     Performed at [facility]   Report Status 01/23/2014 FINAL   Final  Studies:  Recent x-ray studies have been reviewed in detail by the Attending Physician  Scheduled Meds:  Scheduled Meds: . antiseptic oral rinse  7 mL Mouth Rinse BID  . ciprofloxacin  250 mg Oral BID  . docusate sodium  100 mg Oral BID  . heparin subcutaneous  5,000 Units Subcutaneous 3 times per day  . isosorbide mononitrate  60 mg Oral Daily  . levothyroxine  25 mcg Oral QAC breakfast  . metoprolol succinate  100 mg Oral Daily  . pantoprazole  40 mg Oral Daily    Time spent on care of this patient: 35 mins   ELLIS,ALLISON L. , ANP   [facility] Office  [phone] Pager - [phone]  On-Call/Text Page:      [email or web address]      password [email or web address]  If 7PM-7AM, please contact night-coverage [email or web address] Password [email or web address] 01/25/2014, 11:52 AM   LOS: 9 days  Examined patient with ANP Ebony Hail and agree with assessment and plan. Patient with  multiple complex medical problems direct patient care> 40 minute

## 2014-01-25 NOTE — Progress Notes (Signed)
Ortho tech. Claimed to bring the prafo boots in am,  To order from outside.

## 2014-01-25 NOTE — Progress Notes (Signed)
NIF= -44, VC = 2.4L w/ good patient effort.

## 2014-01-25 NOTE — Progress Notes (Signed)
Subjective: Continues to be weak.   Exam: Filed Vitals:   01/25/14 0720  BP: 115/88  Pulse: 104  Temp: 98.8 F (37.1 C)  Resp: 15   Gen: In bed, NAD MS: Awake, alert, oriented to person, place, year DJ:SHFWY, EOMI without diploplia, face symmetric.  Motor: 4/5 strength in a proximal stronger than distal distribution in the upper extremities, 4-/5 wrist extension. She has 3-/5 hip flexion, 1/5 knee extension, 3/5 ankle dorsiflexion and 1/5 plantar flexion. 3/5 hip flexion.  Sensory:decreased below the mid-forearm bilaterally. Absent vibration to the wrists bilaterally, decreased but present temperature and touch sensation.  DTR:She has a right biceps reflex, decreased left biceps, absent LE reflexes.    Impression: 43 yo F with a length dependant loss of large fiber > small fiber function(by exam) consistent with an acute neuropathy such as AIDP. With no pleocytosis on LP, I feel that this is the most likely diagnosis. She had no elevation in protein, however early on in GBS, this can be negative. Her ESR was elevated, but this was after receiving IVIG which does raise the ESR. I would favor continuing to monitor for improvement over the next few days to see if she has improvement.   NIF and VC have been stable for multiple days.   Recommendations: 1) Continue to monitor 2) would favor continued observation for a few days post-dose to see if the patient begins to have an improvement. If no response, or if continues worsening, then may need to consider further therapy.  3) will continue to follow.  4) continue PT, OT.    Roland Rack, MD [facility] [phone]  If 7pm- 7am, please page neurology on call as listed in [place].

## 2014-01-25 NOTE — Progress Notes (Signed)
NIF= -50, VC= 2.0 L w/ good pt effort.

## 2014-01-26 LAB — COMPREHENSIVE METABOLIC PANEL
ALBUMIN: 2 g/dL — AB (ref 3.5–5.2)
ALT: 34 U/L (ref 0–35)
ANION GAP: 13 (ref 5–15)
AST: 100 U/L — ABNORMAL HIGH (ref 0–37)
Alkaline Phosphatase: 210 U/L — ABNORMAL HIGH (ref 39–117)
BILIRUBIN TOTAL: 0.8 mg/dL (ref 0.3–1.2)
BUN: 10 mg/dL (ref 6–23)
CHLORIDE: 95 meq/L — AB (ref 96–112)
CO2: 23 mEq/L (ref 19–32)
CREATININE: 0.45 mg/dL — AB (ref 0.50–1.10)
Calcium: 8.7 mg/dL (ref 8.4–10.5)
GFR calc Af Amer: >90 mL/min (ref >90)
GFR calc non Af Amer: >90 mL/min (ref >90)
GLUCOSE: 84 mg/dL (ref 70–99)
Potassium: 3.7 mEq/L (ref 3.7–5.3)
Sodium: 131 mEq/L — ABNORMAL LOW (ref 137–147)
Total Protein: 7.6 g/dL (ref 6.0–8.3)

## 2014-01-26 LAB — CBC WITH DIFFERENTIAL/PLATELET
BASOS ABS: 0.1 10*3/uL (ref 0.0–0.1)
Basophils Relative: 1 % (ref 0–1)
Eosinophils Absolute: 0.1 10*3/uL (ref 0.0–0.7)
Eosinophils Relative: 1 % (ref 0–5)
HEMATOCRIT: 29 % — AB (ref 36.0–46.0)
Hemoglobin: 9.9 g/dL — ABNORMAL LOW (ref 12.0–15.0)
LYMPHS PCT: 62 % — AB (ref 12–46)
Lymphs Abs: 3.6 10*3/uL (ref 0.7–4.0)
MCH: 32.2 pg (ref 26.0–34.0)
MCHC: 34.1 g/dL (ref 30.0–36.0)
MCV: 94.5 fL (ref 78.0–100.0)
MONO ABS: 0.7 10*3/uL (ref 0.1–1.0)
Monocytes Relative: 12 % (ref 3–12)
Neutro Abs: 1.3 10*3/uL — ABNORMAL LOW (ref 1.7–7.7)
Neutrophils Relative %: 24 % — ABNORMAL LOW (ref 43–77)
PLATELETS: 318 10*3/uL (ref 150–400)
RBC: 3.07 MIL/uL — ABNORMAL LOW (ref 3.87–5.11)
RDW: 16.7 % — ABNORMAL HIGH (ref 11.5–15.5)
WBC: 5.7 10*3/uL (ref 4.0–10.5)

## 2014-01-26 LAB — MAGNESIUM: Magnesium: 1.5 mg/dL (ref 1.5–2.5)

## 2014-01-26 LAB — MYASTHENIA GRAVIS PANEL 2
Acety choline binding ab: <0.3 nmol/L
Acetylchol Block Ab: <15 % of inhibition (ref <15)
Acetylcholine Modulat Ab: 9 %

## 2014-01-26 LAB — C-REACTIVE PROTEIN: CRP: <0.5 mg/dL — ABNORMAL LOW (ref <0.60)

## 2014-01-26 MED ORDER — MAGNESIUM CITRATE PO SOLN
0.5000 | Freq: Once | ORAL | Status: AC
  Administered 2014-01-26: 0.5 via ORAL
  Filled 2014-01-26: qty 296

## 2014-01-26 NOTE — Progress Notes (Signed)
Orthopedic Tech Progress Note Patient Details:  Crystal Stein June 24, 1970 [id number]  Ortho Devices Ortho Device/Splint Location: bilateral (prafo boot) Ortho Device/Splint Interventions: Application   [name], [name] 01/26/2014, 7:43 PM

## 2014-01-26 NOTE — Progress Notes (Signed)
Pt arrived on unit.  Oriented and settled into room.  Assessment complete, no c/o pain.  Will continue to monitor. Sondra ComeSilva, Eboney Claybrook M, RN 01/26/2014 12:10 PM

## 2014-01-26 NOTE — Progress Notes (Signed)
[place] TEAM 1 - Stepdown/ICU TEAM Progress Note  Crystal Stein XHB:[id number] DOB: 03/16/1971 DOA: 01/16/2014 PCP: No PCP Per Patient  Admit HPI / Brief Narrative: 43 year old WF PMHx Anxiety d/o, substance abuse, tobacco abuse, HLD, recently seen at [facility] where she was treated for non-ST elevation MI. She had elevated troponin and underwent cardiac catheterization which did not reveal any significant coronary artery disease and a hyperdynamic EF.  The patient was discharged home and within 4 days return to [facility]. She complained of recurrent chest pain and had elevated troponin. She was seen by cardiology who felt that her elevated troponin was related to her prior MI and did not indicate a new event. Recommendations were to continue the beta blocker and focus on stress management. The patient also described worsening lower extremity weakness and inability to stand. She was able to move her legs in her bed, but was unable to stand.   In the ER an MRI of the lumbar spine did not show any acute abnormalities. She was seen by psychiatry as well as neurology. After being evaluated by neurology, there was concern the patient was developing Guillan Barre syndrome. She underwent lumbar puncture and studies have been sent. She had been started on intravenous immunoglobulin per neurology recommendations. Because there was not neurology service available at [facility] she was transferred to [facility].  HPI/Subjective: Awake and endorses improved pain control.  Feels she is getting stronger in the arms and legs.    Assessment/Plan:  Guillain Barre syndrome? -Completed 5 days of IV Ig -strength continues to improve especially in arms  -NIF has remained stable and not indicative of impending respiratory failure -MRI C-spine negative -Myasthenia panel pending -Neuro also checking heavy metal tox screen -has preserved proximal strength-ESR 70- doubt Polymyositis since myoglobin  normal -CSF nondiagnostic but per neuro when GBS early stage protein can be normal and not elevated as expected -CRP pending -continue foley for acute urinary retention -begin stool softener -pt had low CD4 count in 2010 with nonreactive HIV and HIV is nonreactive this admit  Subclinical hypothyroidism -Free T4 slightly low  -Thyroid antibodies negative -TSH 6.8 -cont Synthroid 25 mcg daily   Hyponatremia -Urine Na 87- serum and urine osmo low -follow w/ careful volume   HTN -better controlled now that pain controlled -cont Imdur and Toprol  Acute encephalopathy -Resolving- patient still occasionally confused.  Elevated transaminase -Abdominal ultrasound nondiagnostic  -Cautious use of Tylenol - change Vicodin to Oxy IR -Hepatitis panel neg  Anxiety/depression.  -Significant stress around divorce and custody issues. Continue Valium 5 mg QID PRN -Psychiatry appointment at discharge  Klebsiella UTI -Continue ciprofloxacin  Code Status: FULL Family Communication: no family present at time of exam Disposition Plan: Transfer to floor-will need prolonged rehab before dc home- CIR following for possible admit  Consultants: Dr. Alexis Goodell (neurologist)  Procedure/Significant Events: 7/28 UDS; positive marijuana/benzodiazepine 7/28 PCXR;Old right rib fractures are present. 7/28 CT angiogram chest PE protocol;No pulmonary emboli or other acute abnormalities of the chest.  -New hepatomegaly and hepatic steatosis. 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 Normal MRI appearance of the cervical/ upper thoracic spinal cord without pathologic enhancement.   Antibiotics: Ciprofloxacin  7/31>> IVIG 8/1>>  DVT prophylaxis: SQ heparin   Objective:  VITAL SIGNS: Temp: 98 F (36.7 C) (08/07 1200) Temp src: Oral (08/07 1200) BP: 128/95 mmHg (08/07 1200) Pulse Rate: 100 (08/07 1200) SPO2; 98% on room air FIO2:  Intake/Output Summary (Last 24 hours) at 01/26/14 1324 Last data filed at 01/26/14 1100  Gross per 24 hour  Intake   1280 ml  Output   1150 ml  Net    130 ml   Exam: General: No acute respiratory distress Lungs: Clear to auscultation bilaterally without wheezes or crackles Cardiovascular: Tachycardic, Regular rhythm without murmur gallop or rub Abdomen: Nontender, nondistended, soft, bowel sounds positive, no rebound, no ascites, no appreciable mass Extremities: No significant cyanosis, clubbing, or edema bilateral lower extremities Neurologic; moves arms (4/5 with left stronger than right) and stronger than yesterday-legs also stronger today (3+-4/5)- sensation intact -distal muscle tone weaker than proximal  Data Reviewed: Basic Metabolic Panel:  Recent Labs Lab 01/21/14 0230 01/23/14 0308 01/24/14 0250 01/25/14 0306 01/26/14 0315  NA 135* 132* 135* 131* 131*  K 4.0 4.2 4.0 3.8 3.7  CL 100 98 98 95* 95*  CO2 _0 GLUCOSE 92 79 88 84 84  BUN 3* _1 CREATININE 0.48* 0.44* 0.48* 0.40* 0.45*  CALCIUM 8.5 8.4 8.7 8.5 8.7  MG  --   --   --   --  1.5   Liver Function Tests:  Recent Labs Lab 01/20/14 0630 01/24/14 0250 01/26/14 0315  AST 269* 87* 100*  ALT 93* 42* 34  ALKPHOS 310* 229* 210*  BILITOT 1.0 0.8 0.8  PROT 5.2* 7.5 7.6  ALBUMIN 1.9* 2.0* 2.0*    Recent Labs Lab 01/19/14 1745  LIPASE 44   CBC:  Recent Labs Lab 01/20/14 0630 01/23/14 0308 01/25/14 0306 01/26/14 0315  WBC 5.5 5.0 6.2 5.7  NEUTROABS  --   --   --  1.3*  HGB 10.5* 9.5* 10.1* 9.9*  HCT 30.5* 28.1* 29.9* 29.0*  MCV 94.4 94.3 94.9 94.5  PLT 243 290 325 318    Recent Results (from the past 240 hour(s))  URINE CULTURE     Status:  None   Collection Time    01/18/14  9:25 AM      Result Value Ref Range Status   Specimen Description URINE, CLEAN CATCH   Final   Special Requests NONE   Final   Culture  Setup Time     Final   Value: 01/18/2014 19:44     Performed at [facility]     Final   Value: >=100,000 COLONIES/ML     Performed at [facility]   Culture     Final   Value: KLEBSIELLA PNEUMONIAE     Performed at [facility]   Report Status 01/20/2014 FINAL   Final   Organism ID, Bacteria KLEBSIELLA PNEUMONIAE   Final  CSF CULTURE     Status: None   Collection Time    01/20/14  1:35 PM      Result Value Ref Range Status   Specimen Description CSF   Final   Special Requests NONE   Final   Gram Stain     Final   Value: CYTOSPIN NO WBC SEEN     NO ORGANISMS SEEN     Performed at [facility]   Culture     Final   Value: NO GROWTH 3 DAYS     Performed at [facility]  Report Status 01/23/2014 FINAL   Final     Studies:  Recent x-ray studies have been reviewed in detail by the Attending Physician  Scheduled Meds:  Scheduled Meds: . antiseptic oral rinse  7 mL Mouth Rinse BID  . ciprofloxacin  250 mg Oral BID  . docusate sodium  100 mg Oral BID  . heparin subcutaneous  5,000 Units Subcutaneous 3 times per day  . isosorbide mononitrate  60 mg Oral Daily  . levothyroxine  25 mcg Oral QAC breakfast  . metoprolol succinate  100 mg Oral Daily  . pantoprazole  40 mg Oral Daily    Time spent on care of this patient: 35 mins   ELLIS,ALLISON L. , ANP   [facility] Office  [phone] Pager - [phone]  On-Call/Text Page:      [email or web address]      password [email or web address]  If 7PM-7AM, please contact night-coverage [email or web address] Password [email or web address] 01/26/2014, 1:24 PM   LOS: 10 days   I have personally examined this patient and reviewed the entire database. I have reviewed the above note, made any necessary editorial changes, and agree with its  content.  Cherene Altes, MD [facility]

## 2014-01-26 NOTE — Progress Notes (Signed)
Patient did NIF and VC with great efforts. NIF - 48, VC 2300.

## 2014-01-26 NOTE — Progress Notes (Signed)
Subjective: Continues to be weak.   Exam: Filed Vitals:   01/26/14 0800  BP: 130/93  Pulse: 100  Temp: 98.1 F (36.7 C)  Resp:    Gen: In bed, NAD MS: Awake, alert, oriented to person, place, year NP:YYFRT, EOMI without diploplia, face symmetric.  Motor: 4/5 strength in a proximal stronger than distal distribution in the upper extremities, 4-/5 wrist extension. She has 3-/5 hip flexion, 1/5 knee extension, 3/5 ankle dorsiflexion and 1/5 plantar flexion. 3/5 hip flexion.  Sensory:decreased below the mid-forearm bilaterally. Absent vibration to the wrists bilaterally, decreased but present temperature and touch sensation.  DTR:She has a right biceps reflex, decreased left biceps, absent LE reflexes.   Na low.   Impression: 43 yo F with a length dependant loss of large fiber > small fiber function(by exam) consistent with an acute neuropathy such as AIDP. With no pleocytosis on LP, I feel that this is the most likely diagnosis. She had no elevation in protein, however early on in GBS, this can be negative. Her ESR was elevated, but this was after receiving IVIG which does raise the ESR. I would favor continuing to monitor for improvement over the next few days to see if she has improvement.   NIF and VC have been stable for multiple days. Also, Na low, this is very common in GBS.   Recommendations: 1) With stable NIF and VC, would be reasonable to transfer to tele bed.  2) would favor continued observation for a few days post-dose to see if the patient begins to have an improvement. If no response, or if continues worsening, then may need to consider further therapy.  3) will continue to follow.  4) continue PT, OT.    Roland Rack, MD [facility] [phone]  If 7pm- 7am, please page neurology on call as listed in [place].

## 2014-01-26 NOTE — Progress Notes (Signed)
Occupational Therapy Treatment Patient Details Name: Crystal Stein MRN: [id number] DOB: 1971/04/30 Today's Date: 01/26/2014    History of present illness  43 yo female admitted for MI and Guillan Barre. Pt recently admitted for chest pain 5 days PTA. PMH: anxiety/depression , bipolar disorder and smokes cigarettes   OT comments  Pt with complaint new onset hand numbness which she reports has been present since this am.  Pt maintains wrists flexed with fingers extended the majority of the time, and is now unable to use call bell.  MMT bil. UEs: Lt. UE:  Shoulder 4-/5; elbow 3+/5; wrist 3+/5; fingers 3/5:  Rt UE  Shoulder 4/5; elbow 3/5; wrist 3/5 - 3-/5; hand unable to fully flex index and middle fingers (2/5); ring and little fingers 3/5.  Pt unable to feed self.  Pt provided with adapted equipment, but fatigues rapidly with use.  Established self feeding goal   Follow Up Recommendations  CIR    Equipment Recommendations  Other (comment) (defer to CIR)    Recommendations for Other Services Rehab consult    Precautions / Restrictions Precautions Precautions: Fall       Mobility Bed Mobility Overal bed mobility: Needs Assistance Bed Mobility: Rolling Rolling: Mod assist         General bed mobility comments: Pt heavy reliance on bed rails  Transfers                      Balance                                   ADL Overall ADL's : Needs assistance/impaired Eating/Feeding: Maximal assistance;Bed level Eating/Feeding Details (indicate cue type and reason): Pt unable to grasp utensil with UEs.  Pt provided with U-cuff and lidded mug.  She demonstrates significant difficulty using AE as fingers stay extended, and maintains wrist flexion.  Fatigued quickly                                    General ADL Comments: Pt with difficulty using AE for self feeding.  Feel she would benefit from wrist support u-cuff.  Unable to attempt due to pt  needing to use bed pan.         Vision                     Perception     Praxis      Cognition   Behavior During Therapy: Anxious Overall Cognitive Status: Within Functional Limits for tasks assessed                       Extremity/Trunk Assessment               Exercises     Shoulder Instructions       General Comments      Pertinent Vitals/ Pain       Pain Assessment: Faces Faces Pain Scale: Hurts little more Pain Location: LEs Pain Descriptors / Indicators: Aching Pain Intervention(s): Monitored during session;Limited activity within patient's tolerance  Home Living                                          Prior  Functioning/Environment              Frequency Min 2X/week     Progress Toward Goals  OT Goals(current goals can now be found in the care plan section)  Progress towards OT goals: Not progressing toward goals - comment (pt now unable to feed self )  Acute Rehab OT Goals Patient Stated Goal: To stand independently OT Goal Formulation: With patient Time For Goal Achievement: 02/06/14 Potential to Achieve Goals: Good ADL Goals Pt Will Perform Eating: with set-up;with adaptive utensils;sitting;bed level Pt Will Perform Grooming: with set-up;sitting Pt Will Perform Upper Body Bathing: with min assist;sitting Pt Will Transfer to Toilet: with +2 assist;with min assist;bedside commode Additional ADL Goal #1: Pt will complete bed mobility with min (A) and bed rails HOB less than 20 degrees  Plan Discharge plan remains appropriate    Co-evaluation                 End of Session     Activity Tolerance Patient tolerated treatment well   Patient Left in bed;with call bell/phone within reach   Nurse Communication Mobility status;Other (comment) (Pt on bedpan)        Time: [id number] OT Time Calculation (min): 42 min  Charges: OT General Charges $OT Visit: 1 Procedure OT Treatments $Self  Care/Home Management : 23-37 mins  Rawn Quiroa M 01/26/2014, 6:00 PM

## 2014-01-26 NOTE — Progress Notes (Signed)
UR COMPLETED  

## 2014-01-27 LAB — BASIC METABOLIC PANEL
Anion gap: 14 (ref 5–15)
BUN: 10 mg/dL (ref 6–23)
CO2: 24 mEq/L (ref 19–32)
Calcium: 8.7 mg/dL (ref 8.4–10.5)
Chloride: 95 mEq/L — ABNORMAL LOW (ref 96–112)
Creatinine, Ser: 0.36 mg/dL — ABNORMAL LOW (ref 0.50–1.10)
Glucose, Bld: 82 mg/dL (ref 70–99)
POTASSIUM: 4 meq/L (ref 3.7–5.3)
Sodium: 133 mEq/L — ABNORMAL LOW (ref 137–147)

## 2014-01-27 LAB — GAMMA GT: GGT: 642 U/L — AB (ref 7–51)

## 2014-01-27 LAB — HEAVY METALS, BLOOD: Lead: <2 ug/dL (ref <10)

## 2014-01-27 MED ORDER — SODIUM CHLORIDE 0.9 % IV SOLN
INTRAVENOUS | Status: AC
  Administered 2014-01-27: 13:00:00 via INTRAVENOUS

## 2014-01-27 MED ORDER — POLYETHYLENE GLYCOL 3350 17 G PO PACK
17.0000 g | PACK | Freq: Every day | ORAL | Status: DC
  Administered 2014-01-30 – 2014-02-01 (×2): 17 g via ORAL
  Filled 2014-01-27 (×4): qty 1

## 2014-01-27 MED ORDER — GABAPENTIN 300 MG PO CAPS
300.0000 mg | ORAL_CAPSULE | Freq: Three times a day (TID) | ORAL | Status: DC
  Administered 2014-01-27 – 2014-02-01 (×15): 300 mg via ORAL
  Filled 2014-01-27 (×15): qty 1

## 2014-01-27 NOTE — Progress Notes (Signed)
Pt performed NIF at -48 with good pt effort and VC of 1.22L with good pt effort.

## 2014-01-27 NOTE — Progress Notes (Addendum)
[facility] PROGRESS NOTE Interim History: 43 year old WF PMHx Anxiety d/o, substance abuse, tobacco abuse, HLD, recently seen at [facility] where she was treated for non-ST elevation MI. The patient also described worsening lower extremity weakness and inability to stand. She was able to move her legs in her bed, but was unable to stand. In the ER an MRI of the lumbar spine did not show any acute abnormalities. She was seen by psychiatry as well as neurology. After being evaluated by neurology, there was concern the patient was developing Guillan Barre syndrome. 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 - NIF and VC with great efforts. NIF - 48, VC 2300.  - MRI c- spine negative. - heavy metal screen pending. - CSF non diagnostic, CRP < 0.5 - stool softener, voiding trial, d'c foley. - PT rec CIR.  Subclinical hypothyroidism  -Free T4 slightly low  -Thyroid antibodies negative  -TSH 6.8  -cont Synthroid 25 mcg daily  Hyponatremia  -Urine Na 87. - chloride low, no urine Cr check start IV fluids.  HTN  -better controlled. -cont Imdur and Toprol.  Acute encephalopathy : - refused. -Resolving- patient still occasionally confused.   Elevated transaminase  - Cont to improve, billi WNL, alk phos 210 and improving. - Abdominal ultrasound nondiagnostic  - Hepatitis panel neg. - Check a GGT, AST more specific for heart, muscle, kidney, brain and WBC/RBC. - also medication can increase your AST > ALT including ETOH. - the fact that ALk phos is elevated point less to medications.  Anxiety/depression.  - Significant stress around divorce and custody issues. Continue Valium 5 mg QID PRN  - Psychiatry appointment at discharge   Klebsiella UTI  -Continue ciprofloxacin    Code Status: FULL  Family Communication: no family present at time of exam  Disposition Plan: Transfer to floor-will need  prolonged rehab before dc home- CIR following for possible admit    Consultants:  neurology  Procedures: 7/28 UDS; positive marijuana/benzodiazepine  7/28 PCXR;Old right rib fractures are present.  7/28 CT angiogram chest PE protocol;No pulmonary emboli or other acute abnormalities of the chest.  -New hepatomegaly and hepatic steatosis.  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 Normal MRI appearance of the cervical/ upper thoracic spinal cord without pathologic enhancement.    Antibiotics: Ciprofloxacin 7/31>> 8.8.2015 IVIG 8/1 completed a 5 days course.   HPI/Subjective: No complains  Objective: Filed Vitals:   01/26/14 1748 01/26/14 2125 01/27/14 0150 01/27/14 0614  BP: 111/82 111/84 118/86 119/82  Pulse: 109 124 125 116  Temp: 98.1 F (36.7 C) 98.4 F (36.9 C) 98.8 F (37.1 C) 98.4 F (36.9 C)  TempSrc: Oral Oral Oral Oral  Resp: '18 18 16 18  ' Height:      Weight:      SpO2: 99% 99% 98% 98%    Intake/Output Summary (Last 24 hours) at 01/27/14 0858 Last data filed at 01/27/14 0755  Gross per 24 hour  Intake    230 ml  Output   1150 ml  Net   -920 ml   Filed Weights   01/20/14 1604 01/21/14 0045 01/23/14 0500  Weight: 50.3 kg (110 lb 14.3 oz) 50.4 kg (111 lb 1.8  oz) 52 kg (114 lb 10.2 oz)    Exam:  General: Alert, awake, oriented x3, in no acute distress.  HEENT: No bruits, no goiter.  Heart: Regular rate and rhythm. Lungs: Good air movement, clear Neuro: lower ext weakness.   Data Reviewed: Basic Metabolic Panel:  Recent Labs Lab 01/23/14 0308 01/24/14 0250 01/25/14 0306 01/26/14 0315 01/27/14 0426  NA 132* 135* 131* 131* 133*  K 4.2 4.0 3.8 3.7 4.0    CL 98 98 95* 95* 95*  CO2 '24 26 24 23 24  ' GLUCOSE 79 88 84 84 82  BUN '8 9 11 10 10  ' CREATININE 0.44* 0.48* 0.40* 0.45* 0.36*  CALCIUM 8.4 8.7 8.5 8.7 8.7  MG  --   --   --  1.5  --    Liver Function Tests:  Recent Labs Lab 01/24/14 0250 01/26/14 0315  AST 87* 100*  ALT 42* 34  ALKPHOS 229* 210*  BILITOT 0.8 0.8  PROT 7.5 7.6  ALBUMIN 2.0* 2.0*   No results found for this basename: LIPASE, AMYLASE,  in the last 168 hours No results found for this basename: AMMONIA,  in the last 168 hours CBC:  Recent Labs Lab 01/23/14 0308 01/25/14 0306 01/26/14 0315  WBC 5.0 6.2 5.7  NEUTROABS  --   --  1.3*  HGB 9.5* 10.1* 9.9*  HCT 28.1* 29.9* 29.0*  MCV 94.3 94.9 94.5  PLT 290 325 318   Cardiac Enzymes: No results found for this basename: CKTOTAL, CKMB, CKMBINDEX, TROPONINI,  in the last 168 hours BNP (last 3 results)  Recent Labs  01/11/14 0610  PROBNP 259.3*   CBG: No results found for this basename: GLUCAP,  in the last 168 hours  Recent Results (from the past 240 hour(s))  URINE CULTURE     Status: None   Collection Time    01/18/14  9:25 AM      Result Value Ref Range Status   Specimen Description URINE, CLEAN CATCH   Final   Special Requests NONE   Final   Culture  Setup Time     Final   Value: 01/18/2014 19:44     Performed at [facility]     Final   Value: >=100,000 COLONIES/ML     Performed at [facility]   Culture     Final   Value: KLEBSIELLA PNEUMONIAE     Performed at [facility]   Report Status 01/20/2014 FINAL   Final   Organism ID, Bacteria KLEBSIELLA PNEUMONIAE   Final  CSF CULTURE     Status: None   Collection Time    01/20/14  1:35 PM      Result Value Ref Range Status   Specimen Description CSF   Final   Special Requests NONE   Final   Gram Stain     Final   Value: CYTOSPIN NO WBC SEEN     NO ORGANISMS SEEN     Performed at [facility]   Culture     Final   Value: NO GROWTH 3 DAYS      Performed at [facility]   Report Status 01/23/2014 FINAL   Final     Studies: No results found.  Scheduled Meds: . antiseptic oral rinse  7 mL Mouth Rinse BID  . docusate sodium  100 mg Oral BID  . heparin subcutaneous  5,000 Units Subcutaneous 3 times per day  . isosorbide mononitrate  60 mg Oral Daily  . levothyroxine  25 mcg Oral QAC breakfast  . metoprolol succinate  100 mg Oral Daily  . pantoprazole  40 mg Oral Daily   Continuous Infusions: . sodium chloride 50 mL/hr at 01/26/14 [place], Saliah Crisp  [facility] Pager [phone]. If 8PM-8AM, please contact night-coverage at [email or web address], password [facility] 01/27/2014, 8:58 AM  LOS: 11 days      **Disclaimer: This note may have been dictated with voice recognition software. Similar sounding words can inadvertently be transcribed and this note may contain transcription errors which may not have been corrected upon publication of note.**

## 2014-01-27 NOTE — Progress Notes (Signed)
Subjective: No changes  Exam: Filed Vitals:   01/27/14 0614  BP: 119/82  Pulse: 116  Temp: 98.4 F (36.9 C)  Resp: 18   Gen: In bed, NAD MS: Awake, alert, oriented to person, place, year OP:FYTWK, EOMI without diploplia, face symmetric.  Motor: 4/5 strength in a proximal stronger than distal distribution in the upper extremities, 4-/5 wrist extension. She has 3-/5 hip flexion, 1/5 knee extension, 3/5 ankle dorsiflexion and 1/5 plantar flexion. 3/5 hip flexion.  Sensory:decreased below the mid-forearm bilaterally. Absent vibration to the wrists bilaterally, decreased but present temperature and touch sensation.  MQK:MMNOTR LE reflexes  Impression: 43 yo F with a length dependant loss of large fiber > small fiber function(by exam) consistent with an acute neuropathy such as AIDP. With no pleocytosis on LP, I feel that this is the most likely diagnosis. She had no elevation in protein, however early on in GBS, this can be negative. Her ESR was elevated, but this was after receiving IVIG which does raise the ESR. I would favor continuing to monitor for improvement over the next few days to see if she has improvement.   NIF and VC have been stable for multiple days. 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   Recommendations: 1) would favor continued observation for a few days post-dose to see if the patient begins to have an improvement. If no response, or if continues worsening, then may need to consider further therapy.  2) will continue to follow.  3) continue PT, OT.    Roland Rack, MD [facility] [phone]  If 7pm- 7am, please page neurology on call as listed in [place].

## 2014-01-28 MED ORDER — LEVOTHYROXINE SODIUM 25 MCG PO TABS: 25.0000 ug | ORAL_TABLET | Freq: Every day | ORAL | Status: AC

## 2014-01-28 MED ORDER — METOPROLOL SUCCINATE ER 100 MG PO TB24: 100.0000 mg | ORAL_TABLET | Freq: Every day | ORAL | Status: DC

## 2014-01-28 NOTE — Progress Notes (Signed)
Nif --50 VC = 1.7

## 2014-01-28 NOTE — Progress Notes (Signed)
NIF -50 and VC 2.8L  With great pt effort

## 2014-01-28 NOTE — Progress Notes (Addendum)
Subjective: No improvement.   Exam: Filed Vitals:   01/28/14 0552  BP: 123/88  Pulse: 104  Temp: 98.6 F (37 C)  Resp: 18   Gen: In bed, NAD MS: Awake, alert, oriented to person, place, year WK:MQKMM, EOMI without diploplia, face symmetric.  Motor: 4/5 strength in a proximal stronger than distal distribution in the upper extremities, 4-/5 wrist extension. She has 3-/5 hip flexion, 1/5 knee extension, 3/5 ankle dorsiflexion and 1/5 plantar flexion. 3/5 hip flexion.  Sensory:decreased below the mid-forearm bilaterally. Absent vibration to the wrists bilaterally, decreased but present temperature and touch sensation.  NOT:RRNHAF LE reflexes  Impression: 43 yo F with a length dependant loss of large fiber > small fiber function(by exam) consistent with an acute neuropathy such as AIDP. With no pleocytosis on LP, I feel that this is the most likely diagnosis. She had no elevation in protein, however early on in GBS, this can be negative. Her ESR was elevated, but this was after receiving IVIG which does raise the ESR and her CRP is normal. I would favor continuing to monitor for improvement over the next few days to see if she has improvement.   NIF and VC have been stable for multiple days. 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   Recommendations: 1) without improvement, may need to consider further therapy. There is debate over whther to repeat IVIG or chnge to plasma exchange, but given that she has had essentially no response to IVIG, I would favor proceeding with PLEX, even though it will remove the previously infused IVIg.  2) will need catheter.    Roland Rack, MD [facility] [phone]  If 7pm- 7am, please page neurology on call as listed in [place].

## 2014-01-29 ENCOUNTER — Encounter (HOSPITAL_COMMUNITY): Payer: Self-pay | Admitting: Radiology

## 2014-01-29 ENCOUNTER — Inpatient Hospital Stay (HOSPITAL_COMMUNITY): Payer: Medicaid Other

## 2014-01-29 LAB — POCT I-STAT, CHEM 8
BUN: 8 mg/dL (ref 6–23)
CALCIUM ION: 1.21 mmol/L (ref 1.12–1.23)
CREATININE: 0.4 mg/dL — AB (ref 0.50–1.10)
Chloride: 99 mEq/L (ref 96–112)
Glucose, Bld: 109 mg/dL — ABNORMAL HIGH (ref 70–99)
HCT: 31 % — ABNORMAL LOW (ref 36.0–46.0)
HEMOGLOBIN: 10.5 g/dL — AB (ref 12.0–15.0)
Potassium: 3.4 mEq/L — ABNORMAL LOW (ref 3.7–5.3)
Sodium: 134 mEq/L — ABNORMAL LOW (ref 137–147)
TCO2: 24 mmol/L (ref 0–100)

## 2014-01-29 LAB — HEPATIC FUNCTION PANEL
ALK PHOS: 179 U/L — AB (ref 39–117)
ALT: 35 U/L (ref 0–35)
AST: 95 U/L — ABNORMAL HIGH (ref 0–37)
Albumin: 2 g/dL — ABNORMAL LOW (ref 3.5–5.2)
BILIRUBIN TOTAL: 0.5 mg/dL (ref 0.3–1.2)
Total Protein: 7.2 g/dL (ref 6.0–8.3)

## 2014-01-29 MED ORDER — CEFAZOLIN SODIUM-DEXTROSE 2-3 GM-% IV SOLR
INTRAVENOUS | Status: AC
  Filled 2014-01-29: qty 50

## 2014-01-29 MED ORDER — CEFAZOLIN (ANCEF) 1 G IV SOLR
2.0000 g | INTRAVENOUS | Status: AC
  Administered 2014-01-29: 2 g
  Filled 2014-01-29: qty 2

## 2014-01-29 MED ORDER — MIDAZOLAM HCL 2 MG/2ML IJ SOLN
INTRAMUSCULAR | Status: AC | PRN
  Administered 2014-01-29: 0.5 mg via INTRAVENOUS
  Administered 2014-01-29: 1 mg via INTRAVENOUS

## 2014-01-29 MED ORDER — HEPARIN SODIUM (PORCINE) 1000 UNIT/ML IJ SOLN
INTRAMUSCULAR | Status: AC
  Filled 2014-01-29: qty 1

## 2014-01-29 MED ORDER — SODIUM CHLORIDE 0.9 % IV SOLN
INTRAVENOUS | Status: AC
  Administered 2014-01-29: 21:00:00 via INTRAVENOUS_CENTRAL
  Filled 2014-01-29 (×3): qty 200

## 2014-01-29 MED ORDER — LIDOCAINE HCL 1 % IJ SOLN
INTRAMUSCULAR | Status: AC
  Filled 2014-01-29: qty 20

## 2014-01-29 MED ORDER — FENTANYL CITRATE 0.05 MG/ML IJ SOLN
INTRAMUSCULAR | Status: AC
  Filled 2014-01-29: qty 4

## 2014-01-29 MED ORDER — MIDAZOLAM HCL 2 MG/2ML IJ SOLN
INTRAMUSCULAR | Status: AC
  Filled 2014-01-29: qty 4

## 2014-01-29 MED ORDER — SODIUM CHLORIDE 0.9 % IV SOLN
4.0000 g | Freq: Once | INTRAVENOUS | Status: DC
  Filled 2014-01-29: qty 40

## 2014-01-29 MED ORDER — FENTANYL CITRATE 0.05 MG/ML IJ SOLN
INTRAMUSCULAR | Status: AC | PRN
  Administered 2014-01-29 (×2): 25 ug via INTRAVENOUS

## 2014-01-29 MED ORDER — ACD FORMULA A 0.73-2.45-2.2 GM/100ML VI SOLN
Status: AC
  Administered 2014-01-29: 21:00:00
  Filled 2014-01-29: qty 500

## 2014-01-29 MED ORDER — ALBUMIN HUMAN 25 % IV SOLN
Freq: Once | INTRAVENOUS | Status: DC
  Filled 2014-01-29: qty 200

## 2014-01-29 NOTE — Progress Notes (Signed)
Patient in for first plama exchange Tx. Tx started and cath pressures were too high for the Tx to run. Lines were reversed, Tx restarted and and pressures remained high and machine would shut down. Attempted to restart Tx again and machine alarmed hemolysis and the fluid in the tubing was pinkish tinged. Tx immediately stopped and reinfusion was not done. Dr. Mervin Kung. Crystal Stein was called and informed of this. Pt had not change in condition and she was taken back to her room.

## 2014-01-29 NOTE — Sedation Documentation (Signed)
Dr. Bonnielee HaffHoss suturing catheter in place. Pt tolerated procedure well. VSS

## 2014-01-29 NOTE — Progress Notes (Signed)
Pt's chest pain is improving but is still present. EKG done. MD notified.

## 2014-01-29 NOTE — Progress Notes (Signed)
Subjective: No improvement.   Exam: Filed Vitals:   01/29/14 0449  BP: 123/87  Pulse: 98  Temp: 98 F (36.7 C)  Resp: 18   Gen: In bed, NAD MS: Awake, alert, oriented to person, place, year MB:EMLJQ, EOMI without diploplia, face symmetric.  Motor: 4/5 strength in a proximal stronger than distal distribution in the upper extremities, 4-/5 wrist extension. She has 3-/5 hip flexion, 1/5 knee extension, 3/5 ankle dorsiflexion and 1/5 plantar flexion. 3/5 hip flexion.  Sensory:decreased below the mid-forearm bilaterally. Absent vibration to the wrists bilaterally, decreased but present temperature and touch sensation.  GBE:EFEOFH LE reflexes  Impression: 43 yo F with a length dependant loss of large fiber > small fiber function(by exam) consistent with an acute neuropathy such as AIDP. With no pleocytosis on LP, I feel that this is the most likely diagnosis. She had no elevation in protein, however early on in GBS, this can be negative. Her ESR was elevated, but this was after receiving IVIG which does raise the ESR and her CRP is normal.  NIF and VC have been stable for multiple days. 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   Recommendations: 1) PLEX   Roland Rack, MD [facility] [phone]  If 7pm- 7am, please page neurology on call as listed in [place].

## 2014-01-29 NOTE — Progress Notes (Signed)
Pt felt pressure in her bladder but was unable to void. Bedside RN unable to find bladder scanner but in-and-out-cathed the pt for her symptoms of a full bladder. Bedside RN emptied 325 mL urine. MD notified.

## 2014-01-29 NOTE — H&P (Signed)
Chief Complaint: "Leg weakness, unable to stand." Referring Physician: Dr. Amada Jupiter HPI: Crystal Stein is an 43 y.o. female with progressive LE weakness and unable to stand. 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 IR received request for plasmapheresis catheter. She denies any chest pain, shortness of breath or palpitations. She denies any active signs of bleeding or excessive bruising. She denies any recent fever or chills. The patient denies any history of sleep apnea or chronic oxygen use. She has previously tolerated sedation without complications.    Past Medical History:  Past Medical History  Diagnosis Date  . Anxiety   . Depression   . Asthma   . 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 COPD (chronic obstructive pulmonary disease)   . GERD (gastroesophageal reflux disease)   . Bipolar disorder   . Blood transfusion 2006  . History of cardiac catheterization July 2015    Angiographically normal coronary arteries with distal tortuosity  . NSTEMI (non-ST elevated myocardial infarction)     Not secondary to obstructive CAD or obvious plaque rupture, possibly vasospasm    Past Surgical History:  Past Surgical History  Procedure Laterality Date  . Tennis elbow release  04/27/06    Left; [facility], [facility]  . Multiple tooth extractions  10/07    Full upper  . Orif proximal humerus fracture  08/06    Left,  . Dilation and curettage of uterus  June 2012    Failed ablation  . Tubal ligation  June 2012    [facility]  . Bipolar disorder    . Hysteroscopy  01/27/2011    Procedure: HYSTEROSCOPY;  Surgeon: Tilda Burrow, MD;  Location: [facility];  Service: Gynecology;  Laterality: N/A;  . Vaginal hysterectomy  05/19/2011    Procedure: HYSTERECTOMY VAGINAL;  Surgeon: Tilda Burrow, MD;  Location: [facility];  Service: Gynecology;  Laterality: N/A;  . Orif wrist fracture  06/17/2012    Procedure: OPEN REDUCTION INTERNAL FIXATION (ORIF) WRIST FRACTURE;  Surgeon:  Vickki Hearing, MD;  Location: [facility];  Service: Orthopedics;  Laterality: Left;  . Carpal tunnel release  06/17/2012    Procedure: CARPAL TUNNEL RELEASE;  Surgeon: Vickki Hearing, MD;  Location: [facility];  Service: Orthopedics;  Laterality: Left;    Family History:  Family History  Problem Relation Age of Onset  . Anesthesia problems Neg Hx   . Hypotension Neg Hx   . Malignant hyperthermia Neg Hx   . Pseudochol deficiency Neg Hx   . Diabetes      Social History:  reports that she has been smoking Cigarettes.  She has a 22 pack-year smoking history. She does not have any smokeless tobacco history on file. She reports that she does not drink alcohol or use illicit drugs.  Allergies: No Known Allergies  Medications:   Medication List    STOP taking these medications       metoprolol tartrate 25 MG tablet  Commonly known as:  LOPRESSOR      TAKE these medications       acetaminophen 500 MG tablet  Commonly known as:  TYLENOL  Take 500 mg by mouth every 6 (six) hours as needed for mild pain or moderate pain.     isosorbide mononitrate 30 MG 24 hr tablet  Commonly known as:  IMDUR  Take 1 tablet (30 mg total) by mouth daily.     levothyroxine 25 MCG tablet  Commonly known as:  SYNTHROID, LEVOTHROID  Take 1 tablet (25 mcg total) by mouth daily before breakfast.     metoprolol succinate 100 MG 24 hr tablet  Commonly known as:  TOPROL-XL  Take 1 tablet (100 mg total) by mouth daily. Take with or immediately following a meal.     nitroGLYCERIN 0.4 MG SL tablet  Commonly known as:  NITROSTAT  Place 1 tablet (0.4 mg total) under the tongue every 5 (five) minutes x 3 doses as needed for chest pain.     omeprazole 20 MG capsule  Commonly known as:  PRILOSEC  Take 20 mg by mouth daily.       Please HPI for pertinent positives, otherwise complete 10 system ROS negative.  Physical Exam: BP 111/78  Pulse 97  Temp(Src) 98 F (36.7 C) (Oral)  Resp 18  Ht 5' 4.5"  (1.638 m)  Wt 114 lb 10.2 oz (52 kg)  BMI 19.38 kg/m2  SpO2 100%  LMP 04/23/2011 Body mass index is 19.38 kg/(m^2).  General Appearance:  Alert, cooperative, no distress  Head:  Normocephalic, without obvious abnormality, atraumatic  Neck: Supple, symmetrical, trachea midline  Lungs:   Clear to auscultation bilaterally, no w/r/r, respirations unlabored without use of accessory muscles.  Chest Wall:  No tenderness or deformity  Heart:  Regular rate and rhythm, S1, S2 normal, no murmur, rub or gallop.  Abdomen:   Soft, non-tender, non distended.  Neurologic: LE weakness bilaterally.    Results for orders placed during the hospital encounter of 01/16/14 (from the past 48 hour(s))  HEPATIC FUNCTION PANEL     Status: Abnormal   Collection Time    01/29/14  9:10 AM      Result Value Ref Range   Total Protein 7.2  6.0 - 8.3 g/dL   Albumin 2.0 (*) 3.5 - 5.2 g/dL   AST 95 (*) 0 - 37 U/L   ALT 35  0 - 35 U/L   Alkaline Phosphatase 179 (*) 39 - 117 U/L   Total Bilirubin 0.5  0.3 - 1.2 mg/dL   Bilirubin, Direct <1.6<0.2  0.0 - 0.3 mg/dL   Indirect Bilirubin NOT CALCULATED  0.3 - 0.9 mg/dL   No results found. 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 Risks and Benefits discussed with the patient. All of the patient's questions were answered, patient is agreeable to proceed. Consent signed and in chart.   Pattricia BossMORGAN, Buzz Axel D PA-C 01/29/2014, 3:09 PM

## 2014-01-29 NOTE — Progress Notes (Signed)
Pt suddenly started having chest pain. Will get an EKG and page the MD. Charge nurse aware.

## 2014-01-29 NOTE — Progress Notes (Signed)
NIF -45/VC 1.5

## 2014-01-29 NOTE — Progress Notes (Signed)
Physical Therapy Treatment Patient Details Name: Crystal Stein MRN: [id number] DOB: 03-11-1971 Today's Date: 01/29/2014    History of Present Illness 43 yo female admitted for MI and Guillan Barre. Pt recently admitted for chest pain 5 days PTA. PMH: anxiety/depression , bipolar disorder and smokes cigarettes    PT Comments    Pt con't to have significant weakness and impaired sensation t/o all 4 extremities. Pt with no sense of proprioception either in UEs or LEs. Pt unable to sense direction of extremities with eyes closed. Pt remains frustrated regarding extreme debilitation but remains motivated to participate with all therapy. Con't to recommend CIR upon d/c.   Follow Up Recommendations  CIR     Equipment Recommendations  Wheelchair (measurements PT);Wheelchair cushion (measurements PT);Rolling walker with 5" wheels    Recommendations for Other Services OT consult;Rehab consult     Precautions / Restrictions Precautions Precautions: Fall Restrictions Weight Bearing Restrictions: No    Mobility  Bed Mobility Overal bed mobility: Needs Assistance Bed Mobility: Rolling;Supine to Sit Rolling: Max assist   Supine to sit: Max assist     General bed mobility comments: pt unable to grip with hands, able to initiate reaching and LE mvmt however requires maxA to achieve complete sidelying L/R, maxAx2 for supine to sit EOB  Transfers Overall transfer level: Needs assistance Equipment used:  (2 person lift with bed pad) Transfers: Sit to/from BJ'sStand;Stand Pivot Transfers Sit to Stand: Max assist;+2 physical assistance Stand pivot transfers: Max assist;+2 physical assistance       General transfer comment: pt able to lock elbows to assist with transfer but with limited ability to pushed through LEs due to significant impaired bilat LE sensation. pt unable to sequence stepping pattern. used 2 person lift with bed pad to transfer pt to chair  Ambulation/Gait                  Stairs            Wheelchair Mobility    Modified Rankin (Stroke Patients Only)       Balance Overall balance assessment: Needs assistance Sitting-balance support: Bilateral upper extremity supported Sitting balance-Leahy Scale: Poor Sitting balance - Comments: pt requires use of UEs but was able to tolerate sitting at EOB for 10 min. we completed LE ther ex. pt had difficult time when someone else was sitting on bed altering the surface   Standing balance support: Bilateral upper extremity supported Standing balance-Leahy Scale: Zero Standing balance comment: pt unable to stand without maximal assist                    Cognition Arousal/Alertness: Awake/alert Behavior During Therapy: Anxious Overall Cognitive Status: Within Functional Limits for tasks assessed                      Exercises General Exercises - Lower Extremity Ankle Circles/Pumps: 15 reps Quad Sets: 10 reps Gluteal Sets: 10 reps Long Arc Quad: 10 reps    General Comments        Pertinent Vitals/Pain Pain Assessment: 0-10 Pain Score: 5  Pain Location: all over Pain Descriptors / Indicators: Constant Pain Intervention(s): Monitored during session    Home Living                      Prior Function            PT Goals (current goals can now be found in the care plan section)  Acute Rehab PT Goals Patient Stated Goal: walk again Progress towards PT goals: Progressing toward goals    Frequency  Min 3X/week    PT Plan Current plan remains appropriate    Co-evaluation             End of Session Equipment Utilized During Treatment: Gait belt Activity Tolerance: Patient limited by fatigue Patient left: in chair;with call bell/phone within reach     Time: 1023-1046 PT Time Calculation (min): 23 min  Charges:  $Therapeutic Exercise: 8-22 mins $Therapeutic Activity: 8-22 mins                    G Codes:      Marcene Brawn 01/29/2014,  12:20 PM  Lewis Shock, PT, DPT Pager #: [phone] Office #: [phone]

## 2014-01-29 NOTE — Procedures (Signed)
RIJV HD cath 23 cm No comp

## 2014-01-29 NOTE — Progress Notes (Signed)
Occupational Therapy Treatment Patient Details Name: Crystal Stein MRN: [id number] DOB: 03-20-1971 Today's Date: 01/29/2014    History of present illness 43 yo female admitted for MI and Guillan Barre. Pt recently admitted for chest pain 5 days PTA. PMH: anxiety/depression , bipolar disorder and smokes cigarettes   OT comments  Pt now with Rt. Wrist extension 2-/5.  She would benefit from wrist cock up splint, but currently has IV in Rt. Forearm.  Pt for PICC placement today so hopefully IV will be removed from Rt. UE to allow for splint.  A u-cuff with wrist support was applied to Rt UE to provide support at wrist to improve functional use and to prevent overstretch of the carpal ligaments. RN instructed in wear times.  Pt was able to drink from lidded cup today using bil. UEs, initially with min A, but progressing to supervision which is an improvement since Friday.  She fatigues quickly with activity.  Pt with complaint chest pain and pressure 9/10 acute onset.  RN notified.   Follow Up Recommendations  CIR    Equipment Recommendations  None recommended by OT    Recommendations for Other Services Rehab consult    Precautions / Restrictions Precautions Precautions: Fall Restrictions Weight Bearing Restrictions: No       Mobility Bed Mobility Overal bed mobility: Needs Assistance Bed Mobility: Rolling;Supine to Sit Rolling: Max assist   Supine to sit: Max assist     General bed mobility comments: pt unable to grip with hands, able to initiate reaching and LE mvmt however requires maxA to achieve complete sidelying L/R, maxAx2 for supine to sit EOB  Transfers Overall transfer level: Needs assistance Equipment used:  (2 person lift with bed pad) Transfers: Sit to/from [employer] Transfers Sit to Stand: Max assist;+2 physical assistance Stand pivot transfers: Max assist;+2 physical assistance       General transfer comment: pt able to lock elbows to assist with  transfer but with limited ability to pushed through LEs due to significant impaired bilat LE sensation. pt unable to sequence stepping pattern. used 2 person lift with bed pad to transfer pt to chair    Balance Overall balance assessment: Needs assistance Sitting-balance support: Bilateral upper extremity supported Sitting balance-Leahy Scale: Poor Sitting balance - Comments: pt requires use of UEs but was able to tolerate sitting at EOB for 10 min. we completed LE ther ex. pt had difficult time when someone else was sitting on bed altering the surface   Standing balance support: Bilateral upper extremity supported Standing balance-Leahy Scale: Zero Standing balance comment: pt unable to stand without maximal assist                   ADL   Eating/Feeding: Maximal assistance;Sitting                                     General ADL Comments: Pt sitting up in chair with complaint of fatigue.  She now has Rt wrist extension 2-/5.  Pt has IV in Rt forearm which will interfere with use of wrist splint.  U-cuff with wrist support placed on Rt hand for time being which provided support and improved pt's ability to use Rt UE as a gross assist.  Pt to have PICC line today so hopefully will be able to order off the shelf wrist splint.  If IV remains, may need to fabricate a thermoplastic  splint.  With U-cuff wrist support brace in place, pt able to drink from lidded mug using bil. UEs, but she fatigues quickly.  She initially required min A, but progressed to supervision       Vision                     Perception     Praxis      Cognition   Behavior During Therapy: Anxious Overall Cognitive Status: Within Functional Limits for tasks assessed                       Extremity/Trunk Assessment               Exercises General Exercises - Upper Extremity Shoulder Flexion: AROM;10 reps;Seated;Right;Left Elbow Flexion: Strengthening;Right;Left;5  reps;Seated (min resistance) Elbow Extension: Strengthening;Right;Left;5 reps;Seated Wrist Extension: AROM;Right;Left;10 reps;Seated;AAROM (AAROM Rt and AROM Lt. ) Digit Composite Flexion: AROM;Right;Left;10 reps;Seated Composite Extension: AROM;Right;Left;10 reps;Seated General Exercises - Lower Extremity Ankle Circles/Pumps: 15 reps Quad Sets: 10 reps Gluteal Sets: 10 reps Long Arc Quad: 10 reps   Shoulder Instructions       General Comments      Pertinent Vitals/ Pain       Pain Assessment: 0-10 Pain Score: 9  Pain Location: Chest pain and pressure acute onset Pain Descriptors / Indicators: Aching Pain Intervention(s): Other (comment) (RN notified.  Vitals taken see RN notes)  Home Living                                          Prior Functioning/Environment              Frequency Min 3X/week     Progress Toward Goals  OT Goals(current goals can now be found in the care plan section)  Progress towards OT goals: Progressing toward goals  Acute Rehab OT Goals Patient Stated Goal: walk again ADL Goals Pt Will Perform Eating: with set-up;with adaptive utensils;sitting;bed level Pt Will Perform Grooming: with set-up;sitting Pt Will Perform Upper Body Bathing: with min assist;sitting Pt Will Transfer to Toilet: with +2 assist;with min assist;bedside commode Additional ADL Goal #1: Pt will complete bed mobility with min (A) and bed rails HOB less than 20 degrees  Plan Discharge plan needs to be updated    Co-evaluation                 End of Session     Activity Tolerance Patient limited by fatigue;Patient limited by pain   Patient Left in chair;with call bell/phone within reach;with nursing/sitter in room   Nurse Communication Other (comment) (chest pain )        Time: [id number] OT Time Calculation (min): 32 min  Charges: OT General Charges $OT Visit: 1 Procedure OT Treatments $Self Care/Home Management : 8-22  mins $Therapeutic Exercise: 8-22 mins  Analeigha Nauman M 01/29/2014, 1:23 PM

## 2014-01-29 NOTE — Progress Notes (Signed)
 [facility] PROGRESS NOTE Interim History: 43-year-old WF PMHx Anxiety d/o, substance abuse, tobacco abuse, HLD, recently seen at [facility] where she was treated for non-ST elevation MI. The patient also described worsening lower extremity weakness and inability to stand. She was able to move her legs in her bed, but was unable to stand. In the ER an MRI of the lumbar spine did not show any acute abnormalities. She was seen by psychiatry as well as neurology. After being evaluated by neurology, there was concern the patient was developing Guillan Barre syndrome. 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 NIF - 48, VC 2300.  - NIf --50 VC = 1.7. stregnth unchanged. - MRI c- spine negative. - heavy metal screen pending. - CSF non diagnostic, CRP < 0.5 - Neurology rec plasmapheresis.  - Catheter to placed. I have explained to the pt the risk and benefit of bleeding, infection but not limited to this and she would like to proceed.  Subclinical hypothyroidism  -Free T4 slightly low  -Thyroid antibodies negative  -TSH 6.8  -cont Synthroid 25 mcg daily  Hyponatremia  - due to pre-renal resolved.  HTN  -better controlled. -cont Imdur and Toprol.  Acute encephalopathy : -Resolved.  Elevated transaminase  - Cont to be high billi WNL, alk phos. recheck LFT's. - Abdominal ultrasound nondiagnostic  - Hepatitis panel neg. - Check a GGT, AST more specific for heart, muscle, kidney, brain and WBC/RBC. - also medication can increase your AST > ALT including ETOH. - the fact that ALk phos is elevated less likely medication and US negative, has remained afebrile.  Anxiety/depression.  - Significant stress around divorce and custody issues. Continue Valium 5 mg QID PRN  - Psychiatry appointment at discharge   Klebsiella UTI  -Continue ciprofloxacin    Code  Status: FULL  Family Communication: no family present at time of exam  Disposition Plan: Transfer to floor-will need prolonged rehab before dc home- CIR following for possible admit    Consultants:  neurology  Procedures: 7/28 UDS; positive marijuana/benzodiazepine  7/28 PCXR;Old right rib fractures are present.  7/28 CT angiogram chest PE protocol;No pulmonary emboli or other acute abnormalities of the chest.  -New hepatomegaly and hepatic steatosis.  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 Normal MRI appearance of the cervical/ upper thoracic spinal cord without pathologic enhancement.    Antibiotics: Ciprofloxacin 7/31>> 8.8.2015 IVIG 8/1 completed a 5 days course.   HPI/Subjective: No complains  Objective: Filed Vitals:   01/28/14 1803 01/28/14 2126 01/29/14 0226 01/29/14 0449  BP: 93/70 133/99 120/78 123/87  Pulse: 97 98 93 98  Temp: 98.5 F (36.9 C) 98.4 F (36.9 C) 98 F (36.7 C) 98 F (36.7 C)  TempSrc: Oral Oral Oral Oral  Resp: 18 18 16 18  Height:      Weight:      SpO2: 99% 100% 98% 98%   No intake or output data in the 24 hours ending 01/29/14 0727 Filed Weights   01/20/14 1604 01/21/14 0045 01/23/14 0500  Weight: 50.3 kg (110 lb 14.3 oz) 50.4 kg (111 lb 1.8 oz) 52 kg (114 lb 10.2   oz)    Exam:  General: Alert, awake, oriented x3, in no acute distress.  HEENT: No bruits, no goiter.  Heart: Regular rate and rhythm. Lungs: Good air movement, clear Neuro: lower ext weakness.   Data Reviewed: Basic Metabolic Panel:  Recent Labs Lab 01/23/14 0308 01/24/14 0250 01/25/14 0306 01/26/14 0315 01/27/14 0426  NA 132* 135* 131* 131* 133*  K 4.2 4.0 3.8 3.7  4.0  CL 98 98 95* 95* 95*  CO2 _0 GLUCOSE 79 88 84 84 82  BUN _1 CREATININE 0.44* 0.48* 0.40* 0.45* 0.36*  CALCIUM 8.4 8.7 8.5 8.7 8.7  MG  --   --   --  1.5  --    Liver Function Tests:  Recent Labs Lab 01/24/14 0250 01/26/14 0315  AST 87* 100*  ALT 42* 34  ALKPHOS 229* 210*  BILITOT 0.8 0.8  PROT 7.5 7.6  ALBUMIN 2.0* 2.0*   No results found for this basename: LIPASE, AMYLASE,  in the last 168 hours No results found for this basename: AMMONIA,  in the last 168 hours CBC:  Recent Labs Lab 01/23/14 0308 01/25/14 0306 01/26/14 0315  WBC 5.0 6.2 5.7  NEUTROABS  --   --  1.3*  HGB 9.5* 10.1* 9.9*  HCT 28.1* 29.9* 29.0*  MCV 94.3 94.9 94.5  PLT 290 325 318   Cardiac Enzymes: No results found for this basename: CKTOTAL, CKMB, CKMBINDEX, TROPONINI,  in the last 168 hours BNP (last 3 results)  Recent Labs  01/11/14 0610  PROBNP 259.3*   CBG: No results found for this basename: GLUCAP,  in the last 168 hours  Recent Results (from the past 240 hour(s))  CSF CULTURE     Status: None   Collection Time    01/20/14  1:35 PM      Result Value Ref Range Status   Specimen Description CSF   Final   Special Requests NONE   Final   Gram Stain     Final   Value: CYTOSPIN NO WBC SEEN     NO ORGANISMS SEEN     Performed at [facility]   Culture     Final   Value: NO GROWTH 3 DAYS     Performed at [facility]   Report Status 01/23/2014 FINAL   Final     Studies: No results found.  Scheduled Meds: . antiseptic oral rinse  7 mL Mouth Rinse BID  . docusate sodium  100 mg Oral BID  . gabapentin  300 mg Oral TID  . heparin subcutaneous  5,000 Units Subcutaneous 3 times per day  . isosorbide mononitrate  60 mg Oral Daily  . levothyroxine  25 mcg Oral QAC breakfast  . metoprolol succinate  100 mg Oral Daily  . pantoprazole  40 mg Oral Daily  . polyethylene glycol  17 g Oral Daily   Continuous Infusions:     Charlynne Cousins  [facility] Pager [phone]. If 8PM-8AM, please contact night-coverage at [email or web address], password [facility] 01/29/2014, 7:27 AM  LOS: 13 days    **Disclaimer: This note may have been dictated with voice recognition software. Similar sounding words can inadvertently be transcribed and this note may contain transcription errors which may not have been corrected upon publication of note.**

## 2014-01-29 NOTE — Progress Notes (Signed)
Dr. Roseanne RenoStewart, MD on call for Neurology, paged regarding the human albumin medication order. Per Neurology, human albumin will be given by dialysis staff and is not to be given this evening or tonight by night shift nurses.

## 2014-01-29 NOTE — Clinical Social Work Note (Signed)
CSW attempted to meet with pt at bedside to discuss discharge disposition. Pt currently occupied with RN. CSW to reassess on 01/30/2014.  Marcelline DeistEmily Iktan Aikman, MSW, [facility] Licensed Clinical Social Worker [phone] and [phone] [phone]

## 2014-01-30 NOTE — Clinical Social Work Placement (Signed)
Clinical Social Work Department CLINICAL SOCIAL WORK PLACEMENT NOTE 01/30/2014  Patient:  Crystal DikeSOMERS,[name]  Account Number:  [id number] Admit date:  01/16/2014  Clinical Social Worker:  Merlyn LotJENNA HOLOMAN, CLINICAL SOCIAL WORKER  Date/time:  01/30/2014 01:27 PM  Clinical Social Work is seeking post-discharge placement for this patient at the following level of care:   SKILLED NURSING   (*CSW will update this form in Epic as items are completed)   01/30/2014  Patient/family provided with [facility] of Clinical Social Work's list of facilities offering this level of care within the geographic area requested by the patient (or if unable, by the patient's family).  01/30/2014  Patient/family informed of their freedom to choose among providers that offer the needed level of care, that participate in Medicare, Medicaid or managed care program needed by the patient, have an available bed and are willing to accept the patient.  01/30/2014  Patient/family informed of [facility]' ownership interest in [facility], as well as of the fact that they are under no obligation to receive care at this facility.  PASARR submitted to EDS on 01/30/2014 PASARR number received on 01/30/2014  FL2 transmitted to all facilities in geographic area requested by pt/family on  01/30/2014 FL2 transmitted to all facilities within larger geographic area on 01/30/2014  Patient informed that his/her managed care company has contracts with or will negotiate with  certain facilities, including the following:     Patient/family informed of bed offers received:   Patient chooses bed at  Physician recommends and patient chooses bed at    Patient to be transferred to  on   Patient to be transferred to facility by  Patient and family notified of transfer on  Name of family member notified:    The following physician request were entered in Epic:   Additional Comments:  Merlyn LotJenna Holoman,  [facility] Clinical Social Worker [phone]

## 2014-01-30 NOTE — Progress Notes (Signed)
Rehab admissions - Noted PLEX to be done qod X 5 days.  If patient is still hospitalized after plasma exchanges are completed, then can consider for inpatient rehab admission.  Call me for questions.  #[phone]

## 2014-01-30 NOTE — Clinical Social Work Psychosocial (Signed)
Clinical Social Work Department BRIEF PSYCHOSOCIAL ASSESSMENT 01/30/2014  Patient:  Crystal Stein, Crystal Stein     Account Number:  [id number]     Admit date:  01/16/2014  Clinical Social Worker:  Domenica Reamer, [place]  Date/Time:  01/30/2014 11:28 AM  Referred by:  Physician  Date Referred:  01/29/2014 Referred for  SNF Placement   Other Referral:   CIR   Interview type:  Patient Other interview type:   none    PSYCHOSOCIAL DATA Living Status:  FAMILY Admitted from facility:   Level of care:   Primary support name:  Bertram Denver Primary support relationship to patient:  PARENT Degree of support available:   Patient reports that she lives with her parents and her brother in [place].  Patient expressed that her family is sufficient emotional support but doesn't feel as if they can meet her physical needs at this time.    CURRENT CONCERNS Current Concerns  Post-Acute Placement   Other Concerns:   none    SOCIAL WORK ASSESSMENT / PLAN CSW referred for SNF placement at time of discharge.  CSW met with patient at bedside to discuss discharge disposition.  Patient stated that she is from home where she lives with her parents and adult brother.  CSW discussed with patient PT recommendations for SNF placement after discharge.  Patient stated that she was agreeable to SNF placement in [facility].  CSW to make appropriate referrals.  CSW to continue to follow and assist with discharge planning needs.   Assessment/plan status:  Psychosocial Support/Ongoing Assessment of Needs Other assessment/ plan:   none   Information/referral to community resources:   [facility] SNF bed offers, psychosocial support    PATIENT'S/FAMILY'S RESPONSE TO PLAN OF CARE: Patient stated understanding and agreeable to CSW plan of care.  Patient expressed no further questions or concerns regarding placement at this time.     Domenica Reamer, [place] Social  Worker [phone]

## 2014-01-30 NOTE — Progress Notes (Signed)
NIF -50, VC 1.75 L with good patient effort.

## 2014-01-30 NOTE — Progress Notes (Addendum)
Subjective: No improvement.   Exam: Filed Vitals:   01/30/14 0549  BP: 110/85  Pulse: 112  Temp: 97.8 F (36.6 C)  Resp: 18   Gen: In bed, NAD MS: Awake, alert, oriented to person, place, year HB:ZJIRC, EOMI without diploplia, face symmetric.  Motor: 4/5 strength in a proximal stronger than distal distribution in the upper extremities, 4-/5 wrist extension. She has 3-/5 hip flexion, 1/5 knee extension, 3/5 ankle dorsiflexion and 1/5 plantar flexion. 3/5 hip flexion.  Sensory:decreased below the mid-forearm bilaterally. Absent vibration to the wrists bilaterally, decreased but present temperature and touch sensation.  VEL:FYBOFB LE and UE reflexes  Impression: 43 yo F with a length dependant loss of large fiber > small fiber function(by exam) consistent with an acute neuropathy such as AIDP. With no pleocytosis on LP, I feel that this is the most likely diagnosis. She had no elevation in protein, however early on in GBS, this can be negative. Her ESR was elevated, but this was after receiving IVIG which does raise the ESR and her CRP is normal.  NIF and VC have been stable for multiple days. 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   Recommendations: 1) continue PLEX qod for 5 treatments   Roland Rack, MD [facility] [phone]  If 7pm- 7am, please page neurology on call as listed in [place].

## 2014-01-30 NOTE — Clinical Social Work Note (Signed)
CSW contacted financial counseling department regarding possible medicaid eligibility for the patient.  CSW left voicemail.  Merlyn LotJenna Holoman, LCSWA Clinical Social Worker [phone]

## 2014-01-30 NOTE — Progress Notes (Addendum)
[facility] PROGRESS NOTE Interim History: 43 year old WF PMHx Anxiety d/o, substance abuse, tobacco abuse, HLD, recently seen at [facility] where she was treated for non-ST elevation MI. The patient also described worsening lower extremity weakness and inability to stand. She was able to move her legs in her bed, but was unable to stand. In the ER an MRI of the lumbar spine did not show any acute abnormalities. She was seen by psychiatry as well as neurology. After being evaluated by neurology, there was concern the patient was developing Guillan Barre syndrome. She underwent lumbar puncture whichc was    Assessment/Plan:  Guillain Barr syndrome - Completed a 5 days course of steroids - NIF and VC improving. - She is now incontinence, Place foley can get a voiding trial in 3 days. Continuing telemetry monitor for any autonomic dysfunction. - MRI c- spine negative. - heavy metal screen pending. - CSF non diagnostic, CRP < 0.5 - Neurology rec plasmapheresis, first treatment on 01/29/2014. - Catheter to placed 8.10.2015. I have explained to the pt the risk and benefit of bleeding, infection but not limited to this and she would like to proceed.  Subclinical hypothyroidism  -Free T4 slightly low  -Thyroid antibodies negative  -TSH 6.8  -cont Synthroid 25 mcg daily  Hyponatremia  - due to pre-renal, started on IV fluids now KVO resolved.  HTN  -better controlled. -cont Imdur and Toprol.  Acute encephalopathy : -Resolved.  Elevated transaminase  - Cont to be high billi WNL, alk phos. recheck LFT's. - Abdominal ultrasound nondiagnostic  - Hepatitis panel neg. - high GGT, AST more specific for heart, muscle, kidney, brain and WBC/RBC. Unclear etiology of raised markers, but cont to improve. - also medication can increase your AST > ALT including ETOH. - has remained afebrile.  Anxiety/depression.  - Significant stress around divorce and custody issues. Continue  Valium 5 mg QID PRN  - Psychiatry appointment at discharge   Klebsiella UTI  -Continue ciprofloxacin    Code Status: FULL  Family Communication: no family present at time of exam  Disposition Plan: Transfer to floor-will need prolonged rehab before dc home- CIR following for possible admit    Consultants:  neurology  Procedures: 7/28 UDS; positive marijuana/benzodiazepine  7/28 PCXR;Old right rib fractures are present.  7/28 CT angiogram chest PE protocol;No pulmonary emboli or other acute abnormalities of the chest.  -New hepatomegaly and hepatic steatosis.  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 Normal MRI appearance of the cervical/ upper thoracic spinal cord without pathologic enhancement.    Antibiotics: Ciprofloxacin 7/31>> 8.8.2015 IVIG 8/1 completed a 5 days course.   HPI/Subjective: Catheter area, sore.  Objective: Filed Vitals:   01/29/14 2130 01/30/14 0242 01/30/14 0549 01/30/14 1055  BP: 90/52 129/81 110/85 114/86  Pulse: 119 53 112 112  Temp: 98.1 F (36.7 C) 99.1 F (37.3 C) 97.8 F (36.6 C) 98.7 F (37.1 C)  TempSrc:  Oral Oral Oral  Resp: '18 18 18 18  ' Height:      Weight:      SpO2:  96% 98% 98%    Intake/Output Summary (Last 24 hours) at 01/30/14 1107 Last data filed at 01/29/14 1700  Gross per 24 hour  Intake      0 ml  Output    325 ml  Net   -325 ml   Filed Weights   01/20/14 1604 01/21/14 0045 01/23/14 0500  Weight: 50.3 kg (110 lb 14.3 oz) 50.4 kg (111 lb 1.8 oz) 52 kg (114 lb 10.2 oz)    Exam:  General: Alert, awake, oriented x3, in no acute distress.  HEENT: No bruits, no goiter.  Heart: Regular rate and rhythm. Lungs: Good air  movement, clear Neuro: lower ext weakness.   Data Reviewed: Basic Metabolic Panel:  Recent Labs Lab 01/24/14 0250 01/25/14 0306 01/26/14 0315 01/27/14 0426 01/29/14 2109  NA 135* 131* 131* 133* 134*  K 4.0 3.8 3.7 4.0 3.4*  CL 98 95* 95* 95* 99  CO2 '26 24 23 24  ' --   GLUCOSE 88 84 84 82 109*  BUN '9 11 10 10 8  ' CREATININE 0.48* 0.40* 0.45* 0.36* 0.40*  CALCIUM 8.7 8.5 8.7 8.7  --   MG  --   --  1.5  --   --    Liver Function Tests:  Recent Labs Lab 01/24/14 0250 01/26/14 0315 01/29/14 0910  AST 87* 100* 95*  ALT 42* 34 35  ALKPHOS 229* 210* 179*  BILITOT 0.8 0.8 0.5  PROT 7.5 7.6 7.2  ALBUMIN 2.0* 2.0* 2.0*   No results found for this basename: LIPASE, AMYLASE,  in the last 168 hours No results found for this basename: AMMONIA,  in the last 168 hours CBC:  Recent Labs Lab 01/25/14 0306 01/26/14 0315 01/29/14 2109  WBC 6.2 5.7  --   NEUTROABS  --  1.3*  --   HGB 10.1* 9.9* 10.5*  HCT 29.9* 29.0* 31.0*  MCV 94.9 94.5  --   PLT 325 318  --    Cardiac Enzymes: No results found for this basename: CKTOTAL, CKMB, CKMBINDEX, TROPONINI,  in the last 168 hours BNP (last 3 results)  Recent Labs  01/11/14 0610  PROBNP 259.3*   CBG: No results found for this basename: GLUCAP,  in the last 168 hours  Recent Results (from the past 240 hour(s))  CSF CULTURE     Status: None   Collection Time    01/20/14  1:35 PM      Result Value Ref Range Status   Specimen Description CSF   Final   Special Requests NONE   Final   Gram Stain     Final   Value: CYTOSPIN NO WBC SEEN     NO ORGANISMS SEEN     Performed at [facility]   Culture     Final   Value: NO GROWTH 3 DAYS     Performed at [facility]   Report Status 01/23/2014 FINAL   Final     Studies: Ir Fluoro Guide Cv Line Right  01/29/2014   CLINICAL DATA:  Rosalee Kaufman  EXAM: TUNNELED DIALYSIS CATHETER PLACEMENT, ULTRASOUND GUIDANCE FOR VASCULAR ACCESS  FLUOROSCOPY TIME:  18 seconds.   MEDICATIONS AND MEDICAL HISTORY: Versed 1.5 mg, Fentanyl 50 mcg.  As antibiotic prophylaxis, Ancef was ordered pre-procedure and administered intravenously within one hour of incision.  ANESTHESIA/SEDATION: Moderate sedation time: 20 minutes  CONTRAST:  None  PROCEDURE: The procedure, risks, benefits, and alternatives were explained to the patient. Questions regarding the procedure were encouraged and answered. The patient understands and consents to the procedure.  The right neck was prepped with Betadine in a sterile fashion, and a sterile drape was applied covering the operative field. A sterile gown and  sterile gloves were used for the procedure. 1% lidocaine into the skin and subcutaneous tissue. The right internal jugular vein was noted to be patent initially with ultrasound. Under sonographic guidance, a micropuncture needle was inserted into the right IJ vein (Ultrasound and fluoroscopic image documentation was performed). It was removed over an 018 wire which was upsized to an Amplatz. This was advanced into the IVC.  A small incision was made in the right upper chest. The tunneling device was utilized to advance the 23 centimeter tip to cuff catheter from the chest incision and out the neck incision. A peel-away sheath was advanced over the Amplatz wire. The leading edge of the catheter was then advanced through the peel-away sheath. The peel-away sheath was removed. It was flushed and instilled with heparin. The chest incision was closed with a 0 Prolene pursestring stitch. The neck incision was closed with a 4-0 Vicryl subcuticular stitch. No complications.  FINDINGS: The image demonstrates placement of a tunneled dialysis catheter with its tip in the right atrium.  IMPRESSION: Successful right IJ vein tunneled dialysis catheter with its tip in the right atrium.   Electronically Signed   By: Maryclare Bean M.D.   On: 01/29/2014 15:51   Ir US Guide Vasc Access Right  01/29/2014   CLINICAL DATA:  Guillain  Barre  EXAM: TUNNELED DIALYSIS CATHETER PLACEMENT, ULTRASOUND GUIDANCE FOR VASCULAR ACCESS  FLUOROSCOPY TIME:  18 seconds.  MEDICATIONS AND MEDICAL HISTORY: Versed 1.5 mg, Fentanyl 50 mcg.  As antibiotic prophylaxis, Ancef was ordered pre-procedure and administered intravenously within one hour of incision.  ANESTHESIA/SEDATION: Moderate sedation time: 20 minutes  CONTRAST:  None  PROCEDURE: The procedure, risks, benefits, and alternatives were explained to the patient. Questions regarding the procedure were encouraged and answered. The patient understands and consents to the procedure.  The right neck was prepped with Betadine in a sterile fashion, and a sterile drape was applied covering the operative field. A sterile gown and sterile gloves were used for the procedure. 1% lidocaine into the skin and subcutaneous tissue. The right internal jugular vein was noted to be patent initially with ultrasound. Under sonographic guidance, a micropuncture needle was inserted into the right IJ vein (Ultrasound and fluoroscopic image documentation was performed). It was removed over an 018 wire which was upsized to an Amplatz. This was advanced into the IVC.  A small incision was made in the right upper chest. The tunneling device was utilized to advance the 23 centimeter tip to cuff catheter from the chest incision and out the neck incision. A peel-away sheath was advanced over the Amplatz wire. The leading edge of the catheter was then advanced through the peel-away sheath. The peel-away sheath was removed. It was flushed and instilled with heparin. The chest incision was closed with a 0 Prolene pursestring stitch. The neck incision was closed with a 4-0 Vicryl subcuticular stitch. No complications.  FINDINGS: The image demonstrates placement of a tunneled dialysis catheter with its tip in the right atrium.  IMPRESSION: Successful right IJ vein tunneled dialysis catheter with its tip in the right atrium.   Electronically  Signed   By: Maryclare Bean M.D.   On: 01/29/2014 15:51    Scheduled Meds: . antiseptic oral rinse  7 mL Mouth Rinse BID  . calcium gluconate IVPB  4 g Intravenous Once  . docusate sodium  100 mg Oral BID  . gabapentin  300 mg Oral TID  . heparin subcutaneous  5,000 Units Subcutaneous 3 times per  day  . isosorbide mononitrate  60 mg Oral Daily  . levothyroxine  25 mcg Oral QAC breakfast  . metoprolol succinate  100 mg Oral Daily  . pantoprazole  40 mg Oral Daily  . polyethylene glycol  17 g Oral Daily   Continuous Infusions:     Charlynne Cousins  [facility] Pager [phone]. If 8PM-8AM, please contact night-coverage at [email or web address], password [email or web address] 01/30/2014, 11:07 AM  LOS: 14 days    **Disclaimer: This note may have been dictated with voice recognition software. Similar sounding words can inadvertently be transcribed and this note may contain transcription errors which may not have been corrected upon publication of note.**

## 2014-01-30 NOTE — Progress Notes (Signed)
Occupational Therapy Treatment Patient Details Name: Crystal Stein MRN: [id number] DOB: 1971/04/15 Today's Date: 01/30/2014    History of present illness 43 yo female admitted for MI and Guillan Barre. Pt recently admitted for chest pain 5 days PTA. PMH: anxiety/depression , bipolar disorder and smokes cigarettes   OT comments  Session focused on self feeding with universal cuff and wrist cock up splint for feeding. Pt demonstrates no wrist extension and minimal digit AROM for feeding without cuff. Ot to continue to follow acutely for adl retraining.   Follow Up Recommendations  CIR    Equipment Recommendations  None recommended by OT    Recommendations for Other Services Rehab consult    Precautions / Restrictions Precautions Precautions: Fall       Mobility Bed Mobility                  Transfers                      Balance                                   ADL   Eating/Feeding: Moderate assistance;Sitting;Bed level Eating/Feeding Details (indicate cue type and reason): Pt with wrist cock up splint on Rt UE and universal cuff placed in palm Pt is able to use shoulder flexion to 90 degrees to scoop food against bowel and bring to mouth. pt requires shoulder elevation to scoop food; Pt reports fatigue at shoulder with efforts to self feed. Pt spilling jello but able to self feed 50% of container without (A) min guard. Pt requires universal cuff to complete any feeding. Pt demonstrates LT hand (A)  durnig session to stabilize Rt Grooming: Wash/dry face;Minimal assistance                                 General ADL Comments: Pt remained bed level due to fatigue. Pt session focused on AE use and self feeding with AE. pt compensating with Lt lateral lean to help scoop Rt UE with cuff      Vision                     Perception     Praxis      Cognition   Behavior During Therapy: WFL for tasks  assessed/performed Overall Cognitive Status: Impaired/Different from baseline Area of Impairment: Memory     Memory: Decreased short-term memory (unable to recall previous therapy session)               Extremity/Trunk Assessment               Exercises     Shoulder Instructions       General Comments      Pertinent Vitals/ Pain       Pain Assessment:  (reports extreme fatigue)  Home Living                                          Prior Functioning/Environment              Frequency Min 3X/week     Progress Toward Goals  OT Goals(current goals can now be found in the care plan section)  Progress towards  OT goals: Progressing toward goals  Acute Rehab OT Goals Patient Stated Goal: walk again OT Goal Formulation: With patient Time For Goal Achievement: 02/06/14 Potential to Achieve Goals: Good ADL Goals Pt Will Perform Eating: with set-up;with adaptive utensils;sitting;bed level Pt Will Perform Grooming: with set-up;sitting Pt Will Perform Upper Body Bathing: with min assist;sitting Pt Will Transfer to Toilet: with +2 assist;with min assist;bedside commode Additional ADL Goal #1: Pt will complete bed mobility with min (A) and bed rails HOB less than 20 degrees  Plan Discharge plan remains appropriate    Co-evaluation                 End of Session     Activity Tolerance Patient tolerated treatment well   Patient Left in bed;with call bell/phone within reach;with bed alarm set   Nurse Communication Mobility status;Precautions        Time: [phone] (first attempt 3 minutes ) OT Time Calculation (min): 20 min  Charges: OT General Charges $OT Visit: 1 Procedure OT Treatments $Self Care/Home Management : 23-37 mins  Boone MasterJones, Shantae Vantol B 01/30/2014, 3:15 PM Pager: [phone]

## 2014-01-30 NOTE — Progress Notes (Signed)
Patient stated again that she felt that she needed to void but was unable to. Bladder scan showed 300 ml. Performed in and out cath. Removed .

## 2014-01-31 DIAGNOSIS — M6281 Muscle weakness (generalized): Secondary | ICD-10-CM

## 2014-01-31 DIAGNOSIS — R1084 Generalized abdominal pain: Secondary | ICD-10-CM

## 2014-01-31 LAB — HEPATIC FUNCTION PANEL
ALT: 26 U/L (ref 0–35)
AST: 63 U/L — ABNORMAL HIGH (ref 0–37)
Albumin: 2.2 g/dL — ABNORMAL LOW (ref 3.5–5.2)
Alkaline Phosphatase: 151 U/L — ABNORMAL HIGH (ref 39–117)
BILIRUBIN TOTAL: 0.5 mg/dL (ref 0.3–1.2)
Total Protein: 7.1 g/dL (ref 6.0–8.3)

## 2014-01-31 LAB — TROPONIN I: Troponin I: <0.3 ng/mL (ref <0.30)

## 2014-01-31 MED ORDER — SODIUM CHLORIDE 0.9 % IV SOLN
4.0000 g | Freq: Once | INTRAVENOUS | Status: AC
  Administered 2014-01-31: 4 g via INTRAVENOUS
  Filled 2014-01-31: qty 40

## 2014-01-31 MED ORDER — ACETAMINOPHEN 325 MG PO TABS
650.0000 mg | ORAL_TABLET | ORAL | Status: DC | PRN
Start: 2014-01-31 — End: 2014-01-31

## 2014-01-31 MED ORDER — ACD FORMULA A 0.73-2.45-2.2 GM/100ML VI SOLN
Status: AC
  Administered 2014-01-31: 17:00:00
  Filled 2014-01-31: qty 500

## 2014-01-31 MED ORDER — CALCIUM CARBONATE ANTACID 500 MG PO CHEW
2.0000 | CHEWABLE_TABLET | ORAL | Status: DC
  Administered 2014-01-31: 400 mg via ORAL

## 2014-01-31 MED ORDER — DIPHENHYDRAMINE HCL 25 MG PO CAPS: 25.0000 mg | ORAL_CAPSULE | Freq: Four times a day (QID) | ORAL | Status: DC | PRN

## 2014-01-31 MED ORDER — HEPARIN SODIUM (PORCINE) 1000 UNIT/ML IJ SOLN: 1000.0000 [IU] | Freq: Once | INTRAMUSCULAR | Status: DC

## 2014-01-31 MED ORDER — ACD FORMULA A 0.73-2.45-2.2 GM/100ML VI SOLN
Status: AC
  Filled 2014-01-31: qty 500

## 2014-01-31 MED ORDER — CALCIUM CARBONATE ANTACID 500 MG PO CHEW
CHEWABLE_TABLET | ORAL | Status: AC
  Filled 2014-01-31: qty 2

## 2014-01-31 MED ORDER — ACD FORMULA A 0.73-2.45-2.2 GM/100ML VI SOLN
500.0000 mL | Status: DC
  Administered 2014-01-31: 500 mL via INTRAVENOUS
  Filled 2014-01-31: qty 500

## 2014-01-31 MED ORDER — SODIUM CHLORIDE 0.9 % IV SOLN
INTRAVENOUS | Status: AC
  Administered 2014-01-31 (×3): via INTRAVENOUS_CENTRAL
  Filled 2014-01-31 (×4): qty 200

## 2014-01-31 NOTE — Progress Notes (Signed)
Rehab admissions - I am cancelling the inpatient rehab admission for today.  If patient can tolerate plasmapheresis this pm and if the troponin is within normal limits, then we can reconsider inpatient rehab admission for tomorrow.  My partner, Campbell LernerJanine, will follow up in am.  She can be reached at [phone]

## 2014-01-31 NOTE — Progress Notes (Signed)
Nif -46, VC 2.1 l.  Good patient effort.

## 2014-01-31 NOTE — Progress Notes (Signed)
Pt c/o chest pain and new numbness in her hands bilaterally. MD paged, STAT EKG and troponin ordered.  Pt now reports that the chest pain has subsided, but the hand numbness remains.  EKG in shadow chart, as well as 30 second strip, pt sinus tach with short PR interval.

## 2014-01-31 NOTE — Progress Notes (Signed)
Rehab admissions - I met with patient.  She would like inpatient rehab admission.  She is hopeful that if she needs SNF at the end of her rehab stay that she can go to the [place] area closer to her family.  I explained that is SNF is needed, she could potentially go anywhere in [place].  I spoke with patient's father.  He tells me that patient will need to be able to get up and around, walk, go to the bathroom prior to coming home with parents.  Parents can provide supervision.  I explained to dad that if placement needed in SNF at discharge, it could be anywhere in [place].  Bed available on inpatient rehab today and will admit to rehab today.  Call me for questions.  #[phone]

## 2014-01-31 NOTE — Progress Notes (Signed)
Physical Therapy Treatment Patient Details Name: Crystal Stein MRN: [id number] DOB: September 10, 1970 Today's Date: 01/31/2014    History of Present Illness 43 yo female admitted for MI and Guillan Barre. Pt recently admitted for chest pain 5 days PTA. PMH: anxiety/depression , bipolar disorder and smokes cigarettes    PT Comments    Pt continues to be highly anxious and fearful with transfers. Requires 2 person (A) for transfers due to decr bil LE strength and sensation. Pt with ataxic movements in bil LEs and demo incr strength with proximal muscles vs distal. C/o 10/10 pain in bil wrists, limiting her ability to WB. Continues to be motivated to participate in therapy; requiring max encouragement due to fear of falling. CIR continues to be highly recommended upon acute D/C.  Follow Up Recommendations  CIR     Equipment Recommendations  Wheelchair (measurements PT);Wheelchair cushion (measurements PT);Rolling walker with 5" wheels    Recommendations for Other Services Rehab consult     Precautions / Restrictions Precautions Precautions: Fall Precaution Comments: reports a fall recently; unable to state when the fall was Restrictions Weight Bearing Restrictions: No    Mobility  Bed Mobility Overal bed mobility: Needs Assistance Bed Mobility: Supine to Sit     Supine to sit: Max assist;HOB elevated     General bed mobility comments: pt attempted to grip handrails with Rt UE and (A) with bed mobility but was limited by pain; minimally moves bil LEs but with ataxic like movements; use of draw pad to bring hips and trunk to upright sitting position   Transfers Overall transfer level: Needs assistance Equipment used: 2 person hand held assist Transfers: Sit to/from [employer] Transfers Sit to Stand: Max assist;+2 physical assistance Stand pivot transfers: Max assist;+2 physical assistance       General transfer comment: requires bil LEs blocking to prevent buckling;  cues to lean anteriorly; pt tends to push posteriorly and states "i just dont want to fall" pt very anxious of transfers due to recent falls; max encouragement throughout session and pt was able to lean anteriorly and did not resist transfer  Ambulation/Gait             General Gait Details: unablet o ambulate; difficulty weightbearing; bil knees buckling    Stairs            Wheelchair Mobility    Modified Rankin (Stroke Patients Only)       Balance Overall balance assessment: Needs assistance;History of Falls Sitting-balance support: Feet supported;No upper extremity supported Sitting balance-Leahy Scale: Fair Sitting balance - Comments: progressed to supervision while sitting EOB pt unable to WB through bil UEs due to pain in wrists; tolerated sitting EOB ~10 min; no c/o dizziness; able to raise shoulders out BOS minimally but required min guard with dynamic activities    Standing balance support: During functional activity;Bilateral upper extremity supported Standing balance-Leahy Scale: Zero Standing balance comment: max (A) of 2 for standing balance; bil knees blocked                    Cognition Arousal/Alertness: Awake/alert Behavior During Therapy: Anxious Overall Cognitive Status: Impaired/Different from baseline Area of Impairment: Memory     Memory: Decreased short-term memory (unable to recall therapy sessions )              Exercises General Exercises - Lower Extremity Ankle Circles/Pumps: AAROM;10 reps;Both;Supine Gluteal Sets: Strengthening;15 reps;Supine Long Arc Quad: AAROM;Both;10 reps;Seated;Other (comment) (AAROM to control muscle contraction) Heel Slides:  AAROM;Both;10 reps;Supine Hip ABduction/ADduction: AAROM;Strengthening;Both;10 reps;Supine    General Comments        Pertinent Vitals/Pain Pain Assessment: 0-10 Pain Score: 10-Worst pain ever Pain Location: reports pain in bil Wrist; increases with WBing Pain  Descriptors / Indicators: Aching Pain Intervention(s): Repositioned    Home Living                      Prior Function            PT Goals (current goals can now be found in the care plan section) Acute Rehab PT Goals Patient Stated Goal: i just want this pain in my hands to stop PT Goal Formulation: With patient Time For Goal Achievement: 02/05/14 Potential to Achieve Goals: Fair Progress towards PT goals: Progressing toward goals    Frequency  Min 3X/week    PT Plan Current plan remains appropriate    Co-evaluation             End of Session Equipment Utilized During Treatment: Gait belt Activity Tolerance: Other (comment) (limite by anxiety and fear of falling ) Patient left: in chair;with call bell/phone within reach;with chair alarm set     Time: [id number] PT Time Calculation (min): 25 min  Charges:  $Therapeutic Exercise: 8-22 mins $Therapeutic Activity: 8-22 mins                    G CodesDonell Sievert:      Wasil Wolke N, [place]PT  [phone] 01/31/2014, 11:10 AM

## 2014-01-31 NOTE — Progress Notes (Signed)
NEURO HOSPITALIST PROGRESS NOTE   SUBJECTIVE:                                                                                                                         No change over night.  OBJECTIVE:                                                                                                                           Vital signs in last 24 hours: Temp:  [97.9 F (36.6 C)-99 F (37.2 C)] 97.9 F (36.6 C) (08/12 1010) Pulse Rate:  [96-109] 106 (08/12 1010) Resp:  [18] 18 (08/12 1010) BP: (102-124)/(66-87) 102/79 mmHg (08/12 1010) SpO2:  [96 %-99 %] 98 % (08/12 1010) Weight:  [49.6 kg (109 lb 5.6 oz)] 49.6 kg (109 lb 5.6 oz) (08/11 1411)  Intake/Output from previous day: 08/11 0701 - 08/12 0700 In: -  Out: 500 [Urine:500] Intake/Output this shift: Total I/O In: 360 [P.O.:360] Out: -  Nutritional status: General  Past Medical History  Diagnosis Date  . Anxiety   . Depression   . Asthma   . 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 COPD (chronic obstructive pulmonary disease)   . GERD (gastroesophageal reflux disease)   . Bipolar disorder   . Blood transfusion 2006  . History of cardiac catheterization July 2015    Angiographically normal coronary arteries with distal tortuosity  . NSTEMI (non-ST elevated myocardial infarction)     Not secondary to obstructive CAD or obvious plaque rupture, possibly vasospasm     Neurologic Exam:  General:NAD Mental Status: Alert, oriented, thought content appropriate.  Speech fluent without evidence of aphasia.  Able to follow 3 step commands without difficulty. Cranial Nerves: ZO:XWRUEA fields grossly normal, pupils equal, round, reactive to light and accommodation III,IV, VI: ptosis not present, extra-ocular motions intact bilaterally V,VII: smile symmetric, facial light touch sensation normal bilaterally VIII: hearing normal bilaterally IX,X: gag reflex present XI:  bilateral shoulder shrug XII: midline tongue extension without atrophy or fasciculations  Motor: Right : Upper extremity   4/5 -2/5 distally   Left:     Upper extremity   4/5---2/5 distally  Lower extremity   5/5 -2/5 distally  Lower extremity   5/5---2/5 distally Tone and bulk:normal tone throughout; no atrophy noted Sensory: Pinprick and light touch vibration decreased from feet to T4 level and decreased to LT and vibration from fingers to elbow Deep Tendon Reflexes:  0/4 throughout  Plantars: Mute bilatearlly    Lab Results: Basic Metabolic Panel:  Recent Labs Lab 01/25/14 0306 01/26/14 0315 01/27/14 0426 01/29/14 2109  NA 131* 131* 133* 134*  K 3.8 3.7 4.0 3.4*  CL 95* 95* 95* 99  CO2 24 23 24   --   GLUCOSE 84 84 82 109*  BUN 11 10 10 8   CREATININE 0.40* 0.45* 0.36* 0.40*  CALCIUM 8.5 8.7 8.7  --   MG  --  1.5  --   --     Liver Function Tests:  Recent Labs Lab 01/26/14 0315 01/29/14 0910 01/31/14 0459  AST 100* 95* 63*  ALT 34 35 26  ALKPHOS 210* 179* 151*  BILITOT 0.8 0.5 0.5  PROT 7.6 7.2 7.1  ALBUMIN 2.0* 2.0* 2.2*   No results found for this basename: LIPASE, AMYLASE,  in the last 168 hours No results found for this basename: AMMONIA,  in the last 168 hours  CBC:  Recent Labs Lab 01/25/14 0306 01/26/14 0315 01/29/14 2109  WBC 6.2 5.7  --   NEUTROABS  --  1.3*  --   HGB 10.1* 9.9* 10.5*  HCT 29.9* 29.0* 31.0*  MCV 94.9 94.5  --   PLT 325 318  --     Cardiac Enzymes: No results found for this basename: CKTOTAL, CKMB, CKMBINDEX, TROPONINI,  in the last 168 hours  Lipid Panel: No results found for this basename: CHOL, TRIG, HDL, CHOLHDL, VLDL, LDLCALC,  in the last 168 hours  CBG: No results found for this basename: GLUCAP,  in the last 168 hours  Microbiology: Results for orders placed during the hospital encounter of 01/16/14  URINE CULTURE     Status: None   Collection Time    01/18/14  9:25 AM      Result Value Ref Range  Status   Specimen Description URINE, CLEAN CATCH   Final   Special Requests NONE   Final   Culture  Setup Time     Final   Value: 01/18/2014 19:44     Performed at [facility] Count     Final   Value: >=100,000 COLONIES/ML     Performed at [facility]   Culture     Final   Value: KLEBSIELLA PNEUMONIAE     Performed at [facility]   Report Status 01/20/2014 FINAL   Final   Organism ID, Bacteria KLEBSIELLA PNEUMONIAE   Final  CSF CULTURE     Status: None   Collection Time    01/20/14  1:35 PM      Result Value Ref Range Status   Specimen Description CSF   Final   Special Requests NONE   Final   Gram Stain     Final   Value: CYTOSPIN NO WBC SEEN     NO ORGANISMS SEEN     Performed at [facility]   Culture     Final   Value: NO GROWTH 3 DAYS     Performed at [facility]   Report Status 01/23/2014 FINAL   Final    Coagulation Studies: No results found for this basename: LABPROT, INR,  in the last 72 hours  Imaging: Ir Fluoro Guide Cv  Line Right  01/29/2014   CLINICAL DATA:  Guillain Barre  EXAM: TUNNELED DIALYSIS CATHETER PLACEMENT, ULTRASOUND GUIDANCE FOR VASCULAR ACCESS  FLUOROSCOPY TIME:  18 seconds.  MEDICATIONS AND MEDICAL HISTORY: Versed 1.5 mg, Fentanyl 50 mcg.  As antibiotic prophylaxis, Ancef was ordered pre-procedure and administered intravenously within one hour of incision.  ANESTHESIA/SEDATION: Moderate sedation time: 20 minutes  CONTRAST:  None  PROCEDURE: The procedure, risks, benefits, and alternatives were explained to the patient. Questions regarding the procedure were encouraged and answered. The patient understands and consents to the procedure.  The right neck was prepped with Betadine in a sterile fashion, and a sterile drape was applied covering the operative field. A sterile gown and sterile gloves were used for the procedure. 1% lidocaine into the skin and subcutaneous tissue. The right internal jugular vein  was noted to be patent initially with ultrasound. Under sonographic guidance, a micropuncture needle was inserted into the right IJ vein (Ultrasound and fluoroscopic image documentation was performed). It was removed over an 018 wire which was upsized to an Amplatz. This was advanced into the IVC.  A small incision was made in the right upper chest. The tunneling device was utilized to advance the 23 centimeter tip to cuff catheter from the chest incision and out the neck incision. A peel-away sheath was advanced over the Amplatz wire. The leading edge of the catheter was then advanced through the peel-away sheath. The peel-away sheath was removed. It was flushed and instilled with heparin. The chest incision was closed with a 0 Prolene pursestring stitch. The neck incision was closed with a 4-0 Vicryl subcuticular stitch. No complications.  FINDINGS: The image demonstrates placement of a tunneled dialysis catheter with its tip in the right atrium.  IMPRESSION: Successful right IJ vein tunneled dialysis catheter with its tip in the right atrium.   Electronically Signed   By: Maryclare BeanArt  Hoss M.D.   On: 01/29/2014 15:51   Ir [place] Guide Vasc Access Right  01/29/2014   CLINICAL DATA:  Guillain Barre  EXAM: TUNNELED DIALYSIS CATHETER PLACEMENT, ULTRASOUND GUIDANCE FOR VASCULAR ACCESS  FLUOROSCOPY TIME:  18 seconds.  MEDICATIONS AND MEDICAL HISTORY: Versed 1.5 mg, Fentanyl 50 mcg.  As antibiotic prophylaxis, Ancef was ordered pre-procedure and administered intravenously within one hour of incision.  ANESTHESIA/SEDATION: Moderate sedation time: 20 minutes  CONTRAST:  None  PROCEDURE: The procedure, risks, benefits, and alternatives were explained to the patient. Questions regarding the procedure were encouraged and answered. The patient understands and consents to the procedure.  The right neck was prepped with Betadine in a sterile fashion, and a sterile drape was applied covering the operative field. A sterile gown and  sterile gloves were used for the procedure. 1% lidocaine into the skin and subcutaneous tissue. The right internal jugular vein was noted to be patent initially with ultrasound. Under sonographic guidance, a micropuncture needle was inserted into the right IJ vein (Ultrasound and fluoroscopic image documentation was performed). It was removed over an 018 wire which was upsized to an Amplatz. This was advanced into the IVC.  A small incision was made in the right upper chest. The tunneling device was utilized to advance the 23 centimeter tip to cuff catheter from the chest incision and out the neck incision. A peel-away sheath was advanced over the Amplatz wire. The leading edge of the catheter was then advanced through the peel-away sheath. The peel-away sheath was removed. It was flushed and instilled with heparin. The chest incision was closed with  a 0 Prolene pursestring stitch. The neck incision was closed with a 4-0 Vicryl subcuticular stitch. No complications.  FINDINGS: The image demonstrates placement of a tunneled dialysis catheter with its tip in the right atrium.  IMPRESSION: Successful right IJ vein tunneled dialysis catheter with its tip in the right atrium.   Electronically Signed   By: Maryclare Bean M.D.   On: 01/29/2014 15:51       MEDICATIONS                                                                                                                        Scheduled: . antiseptic oral rinse  7 mL Mouth Rinse BID  . calcium gluconate IVPB  4 g Intravenous Once  . docusate sodium  100 mg Oral BID  . gabapentin  300 mg Oral TID  . heparin subcutaneous  5,000 Units Subcutaneous 3 times per day  . isosorbide mononitrate  60 mg Oral Daily  . levothyroxine  25 mcg Oral QAC breakfast  . metoprolol succinate  100 mg Oral Daily  . pantoprazole  40 mg Oral Daily  . 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 Patient has received 5 doses of IVIG with no improvement. PLEX was attempted on 8/10 but due to "hemoloysis" (per hemodialysis--this sessio was stopped. Plan it to reattempts first dose of PLEX today.    Recommendations:  1) continue PLEX qod for 5 treatments    Assessment and plan discussed with with attending physician and they are in agreement.    Felicie Morn PA-C [facility] [phone]  01/31/2014, 10:58 AM

## 2014-01-31 NOTE — Progress Notes (Signed)
Spoke to rehab coordinator (Genie), rehab nurse will call for report when bed and order are available. Update status with patient. Will continue to monitor.  Sim BoastHavy, RN

## 2014-01-31 NOTE — Discharge Summary (Addendum)
Physician Discharge Summary  KARRIN EISENMENGER ZOX:[id number] DOB: 12-07-1970 DOA: 01/16/2014  PCP: No PCP Per Patient  Admit date: 01/16/2014 Discharge date: 01/31/2014  Time spent: 45 minutes  Recommendations for Outpatient Follow-up:  Patient will be discharged to inpatient rehabilitation. She is to continue plasmapheresis, which has been arranged for by inpatient rehabilitation. Patient should continue physical therapy occupational and speech therapy as recommended by inpatient rehabilitation. She should continue taking her medications as prescribed. Patient will need a followup with neurology, psychiatry, as well as her primary care physician upon discharge.  Patient to continue a normal diet.  Discharge Diagnoses:  Weakness/Guillian Barre Syndrome Subclinical hypothyroidism Hyponatremia Essential Hypertension Acute encephalopathy Elevated transaminases Anxiety/depression Klebsiella urinary tract infection  Discharge Condition: Stable  Diet recommendation: Regular  Filed Weights   01/21/14 0045 01/23/14 0500 01/30/14 1411  Weight: 50.4 kg (111 lb 1.8 oz) 52 kg (114 lb 10.2 oz) 49.6 kg (109 lb 5.6 oz)    History of present illness on 01/16/2014 Crystal Stein is a 43 y.o. Female who was recently in the hospital approximately 5 days ago when she presented with chest pain. Troponin levels were elevated. She was transferred to [facility] and underwent cardiac catheterization which showed clean coronary arteries. It was felt that she had a non-ST elevation MI. Ejection fraction was at low normal. She presented once again with chest pain which was central in nature, dull. It did not seem to radiate. There was no sweating but she did have nausea.  She has been separated from her husband for the last year and the husband was not allowing her to see her children.  She felt  that this was affecting her significantly and possibly causing the chest pain. She has a history of  anxiety/depression and possible history of bipolar disorder. She smoked cigarettes which she apparently stopped in the last few days. She was admitted for further investigation.  Hospital Course:  Interim history Patient was admitted, and was found to have worsening lower extremity weakness and inability to stand. She was able to move her legs in the bed however was unable to stand. Of the lumbar spine did not show any acute abnormalities. Patient was seen by psychiatry as well as neurology. To be evaluated by neurology there was concern of the patient with developing Guillian Barre Syndrome.  Lumbar puncture was conducted. Patient was started on IVIG per neurology recommendations however failed to improve. Patient was sent remain in hospital to [facility]. Patient will now be starting plasmapheresis and will need 5 treatments. She will be discharged to inpatient rehabilitation today.  Guillain Barr syndrome  - Completed a 5 days course of steroids  - NIF and VC improving.  - She is now incontinence, Place foley can get a voiding trial in 3 days. Continuing telemetry monitor for any autonomic dysfunction.  - MRI c- spine negative.  - heavy metal screen negative - CSF non diagnostic, CRP < 0.5  - Neurology recommends plasmapheresis, 5 treatments total, each to be given every other day. - Catheter to placed 8.10.2015.  -Spoke with inpatient rehab, and this will resumed there.   Subclinical hypothyroidism  -Free T4 slightly low  -Thyroid antibodies negative  -TSH 6.8  -continue Synthroid 25 mcg daily   Hyponatremia  -Improved, likely due to pre-renal -Was started on IV fluids, however, discontinued -Encourage PO intake  Essential hypertension -Continue Imdur and metoprolol  Acute encephalopathy  -Resolved  Elevated transaminase  -Alkaline phosphatase, AST, ALT trended downward. -  Abdominal ultrasound (LUQ): Probable fatty infiltration of the minimally enlarged liver, no definite  gallstones or biliary obstruction identified -Hepatitis panel was negative -high GGT, AST more specific for heart, muscle, kidney, brain and WBC/RBC.  -Unclear etiology of raised markers -has remained afebrile.   Anxiety/depression.  -Significant stress around divorce and custody issues. Continue Valium 5 mg QID PRN  -Psychiatry appointment at discharge   Klebsiella UTI  -Treated with ciprofloxacin  Procedures:  7/28 UDS; positive marijuana/benzodiazepine  7/28 PCXR;Old right rib fractures are present.  7/28 CT angiogram chest PE protocol;No pulmonary emboli or other acute abnormalities of the chest.  -New hepatomegaly and hepatic steatosis.  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 Normal MRI appearance of the cervical/ upper thoracic spinal cord without pathologic enhancement.   Consultations: Neurology Cardiology Psychiatry CIR  Discharge Exam: Filed Vitals:   01/31/14 1010  BP: 102/79  Pulse: 106  Temp: 97.9 F (36.6 C)  Resp: 18    General: Well developed, well nourished, NAD, appears stated age  HEENT: NCAT, PERRLA, EOMI, Anicteic Sclera, mucous membranes moist. Poor dentition.  Neck: Supple, no JVD, no masses  Cardiovascular: S1 S2 auscultated, no rubs, murmurs or gallops. Regular rate and rhythm.  Respiratory: Clear to auscultation bilaterally with equal chest rise  Abdomen: Soft, nontender, nondistended, + bowel sounds  Extremities: warm dry without cyanosis clubbing or edema  Neuro: AAOx3, cranial nerves grossly intact. Extremity weakness, 2/5.   Skin: Without rashes exudates or nodules  Psych: Depressed affect and demeanor, however with intact judgement and  insight  Discharge Instructions      Discharge Instructions   Diet - low sodium heart healthy    Complete by:  As directed      Discharge instructions    Complete by:  As directed   Patient will be discharged to inpatient rehabilitation. She is to continue plasmapheresis, which has been arranged for by inpatient rehabilitation. Patient should continue physical therapy occupational and speech therapy as recommended by inpatient rehabilitation. She should continue taking her medications as prescribed. Patient will need a followup with neurology, psychiatry, as well as her primary care physician upon discharge.  Patient to continue a normal diet.     Increase activity slowly    Complete by:  As directed             Medication List    STOP taking these medications       metoprolol tartrate 25 MG tablet  Commonly known as:  LOPRESSOR      TAKE these medications       acetaminophen 500 MG tablet  Commonly known as:  TYLENOL  Take 500 mg by mouth every 6 (six) hours as needed for mild pain or moderate pain.     isosorbide mononitrate 30 MG 24 hr tablet  Commonly known as:  IMDUR  Take 1 tablet (30 mg total) by mouth daily.     levothyroxine 25 MCG tablet  Commonly known as:  SYNTHROID, LEVOTHROID  Take 1 tablet (25 mcg total) by mouth daily before breakfast.     metoprolol succinate 100 MG 24 hr tablet  Commonly known as:  TOPROL-XL  Take 1 tablet (100 mg total) by mouth daily. Take with or immediately following a meal.  nitroGLYCERIN 0.4 MG SL tablet  Commonly known as:  NITROSTAT  Place 1 tablet (0.4 mg total) under the tongue every 5 (five) minutes x 3 doses as needed for chest pain.     omeprazole 20 MG capsule  Commonly known as:  PRILOSEC  Take 20 mg by mouth daily.       No Known Allergies Follow-up Information   Follow up with [facility] On 02/16/2014. (at 1:00)    Specialty:  Occupational Therapy   Contact information:   [address] [place] [address] [phone]       Schedule an appointment as soon as possible for a visit with PCP.      Schedule an appointment as soon as possible for a visit with Neurology.       The results of significant diagnostics from this hospitalization (including imaging, microbiology, ancillary and laboratory) are listed below for reference.    Significant Diagnostic Studies: Ct Angio Chest Pe W/cm &/or Wo Cm  01/16/2014   CLINICAL DATA:  Chest pain and shortness of breath.  Tachycardia.  EXAM: CT ANGIOGRAPHY CHEST WITH CONTRAST  TECHNIQUE: Multidetector CT imaging of the chest was performed using the standard protocol during bolus administration of intravenous contrast. Multiplanar CT image reconstructions and MIPs were obtained to evaluate the vascular anatomy.  CONTRAST:  OMNIPAQUE IOHEXOL 350 MG/ML SOLN  COMPARISON:  Chest x-ray dated 01/16/2014 and CT scans dated 08/23/2011 and 08/17/2008  FINDINGS: There are no pulmonary emboli or infiltrates or effusions. There is minimal stable scarring in the medial aspect of the right middle lobe. There is a small calcified lymph node at the level of the carina to the right.  Heart size is normal. RV/LV ratio is normal. Ascending thoracic aorta is slightly prominent at a diameter of 3.2 cm, stable.  The patient has developed hepatomegaly with diffuse hepatic steatosis since the prior exam.  No osseous abnormality.  Review of the MIP images confirms the above findings.  IMPRESSION: 1. No pulmonary emboli or other acute abnormalities of the chest. 2. New hepatomegaly and hepatic steatosis.   Electronically Signed   By: Geanie Cooley M.D.   On: 01/16/2014 19:06   Mr Lumbar Spine Wo Contrast  01/18/2014   CLINICAL DATA:  Bilateral lower extremity weakness.  EXAM: MRI LUMBAR SPINE WITHOUT CONTRAST  TECHNIQUE: Multiplanar, multisequence MR imaging of the lumbar spine was performed. No intravenous contrast was administered.  COMPARISON:  CT  abdomen and pelvis 08/22/2008  FINDINGS: Vertebral alignment is normal. The L4 and L5 vertebral bodies as well as the visualized upper sacrum appear mildly hypoplastic/reduced in AP diameter, with prominence of the lower lumbar and upper sacral thecal sac. No definite posterior vertebral scalloping is seen. Prominent nerve root sleeves/perineural cysts are noted in the upper sacrum. Vertebral bone marrow signal is within normal limits. The conus medullaris is normal in signal and terminates at L1. Cauda equina is normal in appearance. There is no disc herniation, spinal canal stenosis, or neural foraminal stenosis. Right renal cyst is again seen.  IMPRESSION: 1. No disc herniation or stenosis. 2. 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   Electronically Signed   By: Sebastian Ache   On: 01/18/2014 21:30   Mr Cervical Spine W Wo Contrast  01/21/2014   CLINICAL DATA:  Progressive numbness and weakness beginning in the legs and now involving the upper extremities.  EXAM: MRI CERVICAL SPINE WITHOUT  AND WITH CONTRAST  TECHNIQUE: Multiplanar and multiecho pulse sequences of the cervical spine, to include the craniocervical junction and cervicothoracic junction, were obtained according to standard protocol without and with intravenous contrast.  CONTRAST:  10mL MULTIHANCE GADOBENATE DIMEGLUMINE 529 MG/ML IV SOLN  COMPARISON:  MRI lumbar spine 01/18/2014.  FINDINGS: Normal signal is present in the cervical and upper thoracic spinal cord to the lowest imaged level, T3-4. Marrow signal, vertebral body heights, and alignment are normal. The postcontrast images demonstrate no pathologic enhancement.  The craniocervical junction is within normal limits. Flow is present in the vertebral arteries.  C2-3:  Negative.  C3-4:  Negative.  C4-5: Mild uncovertebral spurring is worse on the right. There is no significant associated stenosis.  C5-6: A broad-based disc osteophyte complex is  asymmetric to the left. Uncovertebral spurring is noted bilaterally. Mild left foraminal narrowing is present.  C6-7: A shallow central disc protrusion partially effaces the ventral CSF without significant stenosis.  C7-T1:  Negative.  IMPRESSION: 1. Normal MRI appearance of the cervical and upper thoracic spinal cord without pathologic enhancement. 2. Mild left foraminal narrowing at C5-6 due to a leftward disc osteophyte complex and uncovertebral spurring. 3. Mild right-sided uncovertebral spurring at C4-5 without significant stenosis. 4. Shallow central disc protrusion at C6-7 without significant stenosis.   Electronically Signed   By: Gennette Pac M.D.   On: 01/21/2014 13:31   Ir Fluoro Guide Cv Line Right  01/29/2014   CLINICAL DATA:  Alene Mires  EXAM: TUNNELED DIALYSIS CATHETER PLACEMENT, ULTRASOUND GUIDANCE FOR VASCULAR ACCESS  FLUOROSCOPY TIME:  18 seconds.  MEDICATIONS AND MEDICAL HISTORY: Versed 1.5 mg, Fentanyl 50 mcg.  As antibiotic prophylaxis, Ancef was ordered pre-procedure and administered intravenously within one hour of incision.  ANESTHESIA/SEDATION: Moderate sedation time: 20 minutes  CONTRAST:  None  PROCEDURE: The procedure, risks, benefits, and alternatives were explained to the patient. Questions regarding the procedure were encouraged and answered. The patient understands and consents to the procedure.  The right neck was prepped with Betadine in a sterile fashion, and a sterile drape was applied covering the operative field. A sterile gown and sterile gloves were used for the procedure. 1% lidocaine into the skin and subcutaneous tissue. The right internal jugular vein was noted to be patent initially with ultrasound. Under sonographic guidance, a micropuncture needle was inserted into the right IJ vein (Ultrasound and fluoroscopic image documentation was performed). It was removed over an 018 wire which was upsized to an Amplatz. This was advanced into the IVC.  A small incision  was made in the right upper chest. The tunneling device was utilized to advance the 23 centimeter tip to cuff catheter from the chest incision and out the neck incision. A peel-away sheath was advanced over the Amplatz wire. The leading edge of the catheter was then advanced through the peel-away sheath. The peel-away sheath was removed. It was flushed and instilled with heparin. The chest incision was closed with a 0 Prolene pursestring stitch. The neck incision was closed with a 4-0 Vicryl subcuticular stitch. No complications.  FINDINGS: The image demonstrates placement of a tunneled dialysis catheter with its tip in the right atrium.  IMPRESSION: Successful right IJ vein tunneled dialysis catheter with its tip in the right atrium.   Electronically Signed   By: Maryclare Bean M.D.   On: 01/29/2014 15:51   Ir US Guide Vasc Access Right  01/29/2014   CLINICAL DATA:  Guillain Barre  EXAM: TUNNELED DIALYSIS CATHETER PLACEMENT, ULTRASOUND  GUIDANCE FOR VASCULAR ACCESS  FLUOROSCOPY TIME:  18 seconds.  MEDICATIONS AND MEDICAL HISTORY: Versed 1.5 mg, Fentanyl 50 mcg.  As antibiotic prophylaxis, Ancef was ordered pre-procedure and administered intravenously within one hour of incision.  ANESTHESIA/SEDATION: Moderate sedation time: 20 minutes  CONTRAST:  None  PROCEDURE: The procedure, risks, benefits, and alternatives were explained to the patient. Questions regarding the procedure were encouraged and answered. The patient understands and consents to the procedure.  The right neck was prepped with Betadine in a sterile fashion, and a sterile drape was applied covering the operative field. A sterile gown and sterile gloves were used for the procedure. 1% lidocaine into the skin and subcutaneous tissue. The right internal jugular vein was noted to be patent initially with ultrasound. Under sonographic guidance, a micropuncture needle was inserted into the right IJ vein (Ultrasound and fluoroscopic image documentation was  performed). It was removed over an 018 wire which was upsized to an Amplatz. This was advanced into the IVC.  A small incision was made in the right upper chest. The tunneling device was utilized to advance the 23 centimeter tip to cuff catheter from the chest incision and out the neck incision. A peel-away sheath was advanced over the Amplatz wire. The leading edge of the catheter was then advanced through the peel-away sheath. The peel-away sheath was removed. It was flushed and instilled with heparin. The chest incision was closed with a 0 Prolene pursestring stitch. The neck incision was closed with a 4-0 Vicryl subcuticular stitch. No complications.  FINDINGS: The image demonstrates placement of a tunneled dialysis catheter with its tip in the right atrium.  IMPRESSION: Successful right IJ vein tunneled dialysis catheter with its tip in the right atrium.   Electronically Signed   By: Maryclare Bean M.D.   On: 01/29/2014 15:51   Dg Chest Portable 1 View  01/16/2014   CLINICAL DATA:  Chest pain.  EXAM: PORTABLE CHEST - 1 VIEW  COMPARISON:  01/10/2014.  FINDINGS: Mediastinum and hilar structures are normal. Lungs are clear. Heart size normal. No pleural effusion or pneumothorax. Old right rib fractures are present.  IMPRESSION: No active disease.   Electronically Signed   By: Maisie Fus  Register   On: 01/16/2014 16:51   Dg Chest Portable 1 View  01/10/2014   CLINICAL DATA:  Shortness of breath, smoking history  EXAM: PORTABLE CHEST - 1 VIEW  COMPARISON:  Chest x-ray of 08/23/2011 and CT chest of the same day  FINDINGS: The lungs are clear but hyperaerated. Mediastinal and hilar contours appear normal. The heart is within normal limits in size. Old right sixth and seventh rib fractures are noted.  IMPRESSION: Hyperaeration.  No active lung disease.   Electronically Signed   By: Dwyane Dee M.D.   On: 01/10/2014 17:01   US Abdomen Limited Ruq  01/18/2014   CLINICAL DATA:  Elevated LFTs  EXAM: US ABDOMEN LIMITED -  RIGHT UPPER QUADRANT  COMPARISON:  None  FINDINGS: Gallbladder:  Only minimally distended. No definite wall thickening, pericholecystic fluid, stones or sonographic Murphy sign.  Common bile duct:  Diameter: 5 mm diameter common normal  Liver:  Echogenic, likely fatty infiltration, though this can be seen with cirrhosis and certain infiltrative disorders. Question minimally enlarged. No focal hepatic mass or nodularity. Hepatopetal portal venous flow. No intrahepatic biliary dilatation.  No RIGHT upper quadrant ascites identified.  IMPRESSION: Probable fatty infiltration of a minimally enlarged liver as above.  No definite gallstones or biliary obstruction identified.  Electronically Signed   By: Ulyses Southward M.D.   On: 01/18/2014 14:28    Microbiology: No results found for this or any previous visit (from the past 240 hour(s)).   Labs: Basic Metabolic Panel:  Recent Labs Lab 01/25/14 0306 01/26/14 0315 01/27/14 0426 01/29/14 2109  NA 131* 131* 133* 134*  K 3.8 3.7 4.0 3.4*  CL 95* 95* 95* 99  CO2 24 23 24   --   GLUCOSE 84 84 82 109*  BUN 11 10 10 8   CREATININE 0.40* 0.45* 0.36* 0.40*  CALCIUM 8.5 8.7 8.7  --   MG  --  1.5  --   --    Liver Function Tests:  Recent Labs Lab 01/26/14 0315 01/29/14 0910 01/31/14 0459  AST 100* 95* 63*  ALT 34 35 26  ALKPHOS 210* 179* 151*  BILITOT 0.8 0.5 0.5  PROT 7.6 7.2 7.1  ALBUMIN 2.0* 2.0* 2.2*   No results found for this basename: LIPASE, AMYLASE,  in the last 168 hours No results found for this basename: AMMONIA,  in the last 168 hours CBC:  Recent Labs Lab 01/25/14 0306 01/26/14 0315 01/29/14 2109  WBC 6.2 5.7  --   NEUTROABS  --  1.3*  --   HGB 10.1* 9.9* 10.5*  HCT 29.9* 29.0* 31.0*  MCV 94.9 94.5  --   PLT 325 318  --    Cardiac Enzymes: No results found for this basename: CKTOTAL, CKMB, CKMBINDEX, TROPONINI,  in the last 168 hours BNP: BNP (last 3 results)  Recent Labs  01/11/14 0610  PROBNP 259.3*   CBG: No  results found for this basename: GLUCAP,  in the last 168 hours     Signed:  Edsel Petrin  [facility] 01/31/2014, 11:27 AM

## 2014-02-01 ENCOUNTER — Inpatient Hospital Stay (HOSPITAL_COMMUNITY)
Admission: AD | Admit: 2014-02-01 | Discharge: 2014-02-21 | DRG: 945 | Disposition: A | Discharge status: Skilled Nursing Facility | Payer: Medicaid Other | Source: Intra-hospital | Attending: Physical Medicine & Rehabilitation | Admitting: Physical Medicine & Rehabilitation

## 2014-02-01 DIAGNOSIS — N390 Urinary tract infection, site not specified: Secondary | ICD-10-CM | POA: Diagnosis not present

## 2014-02-01 DIAGNOSIS — F319 Bipolar disorder, unspecified: Secondary | ICD-10-CM

## 2014-02-01 DIAGNOSIS — E876 Hypokalemia: Secondary | ICD-10-CM | POA: Diagnosis not present

## 2014-02-01 DIAGNOSIS — F4323 Adjustment disorder with mixed anxiety and depressed mood: Secondary | ICD-10-CM | POA: Diagnosis not present

## 2014-02-01 DIAGNOSIS — R5381 Other malaise: Secondary | ICD-10-CM

## 2014-02-01 DIAGNOSIS — G8250 Quadriplegia, unspecified: Secondary | ICD-10-CM

## 2014-02-01 DIAGNOSIS — M792 Neuralgia and neuritis, unspecified: Secondary | ICD-10-CM

## 2014-02-01 DIAGNOSIS — E871 Hypo-osmolality and hyponatremia: Secondary | ICD-10-CM

## 2014-02-01 DIAGNOSIS — R29898 Other symptoms and signs involving the musculoskeletal system: Secondary | ICD-10-CM

## 2014-02-01 DIAGNOSIS — G609 Hereditary and idiopathic neuropathy, unspecified: Secondary | ICD-10-CM | POA: Diagnosis not present

## 2014-02-01 DIAGNOSIS — R000 Tachycardia, unspecified: Secondary | ICD-10-CM | POA: Diagnosis present

## 2014-02-01 DIAGNOSIS — R7989 Other specified abnormal findings of blood chemistry: Secondary | ICD-10-CM | POA: Diagnosis not present

## 2014-02-01 DIAGNOSIS — K219 Gastro-esophageal reflux disease without esophagitis: Secondary | ICD-10-CM | POA: Diagnosis not present

## 2014-02-01 DIAGNOSIS — D649 Anemia, unspecified: Secondary | ICD-10-CM

## 2014-02-01 DIAGNOSIS — Z5189 Encounter for other specified aftercare: Principal | ICD-10-CM

## 2014-02-01 DIAGNOSIS — I951 Orthostatic hypotension: Secondary | ICD-10-CM | POA: Diagnosis not present

## 2014-02-01 DIAGNOSIS — F411 Generalized anxiety disorder: Secondary | ICD-10-CM | POA: Diagnosis not present

## 2014-02-01 DIAGNOSIS — G610 Guillain-Barre syndrome: Secondary | ICD-10-CM | POA: Diagnosis not present

## 2014-02-01 DIAGNOSIS — I214 Non-ST elevation (NSTEMI) myocardial infarction: Secondary | ICD-10-CM

## 2014-02-01 DIAGNOSIS — E440 Moderate protein-calorie malnutrition: Secondary | ICD-10-CM | POA: Diagnosis not present

## 2014-02-01 DIAGNOSIS — R4181 Age-related cognitive decline: Secondary | ICD-10-CM | POA: Diagnosis not present

## 2014-02-01 DIAGNOSIS — F17200 Nicotine dependence, unspecified, uncomplicated: Secondary | ICD-10-CM

## 2014-02-01 DIAGNOSIS — N319 Neuromuscular dysfunction of bladder, unspecified: Secondary | ICD-10-CM

## 2014-02-01 DIAGNOSIS — F418 Other specified anxiety disorders: Secondary | ICD-10-CM | POA: Diagnosis present

## 2014-02-01 DIAGNOSIS — Z79899 Other long term (current) drug therapy: Secondary | ICD-10-CM

## 2014-02-01 DIAGNOSIS — J449 Chronic obstructive pulmonary disease, unspecified: Secondary | ICD-10-CM

## 2014-02-01 DIAGNOSIS — R339 Retention of urine, unspecified: Secondary | ICD-10-CM

## 2014-02-01 DIAGNOSIS — A4902 Methicillin resistant Staphylococcus aureus infection, unspecified site: Secondary | ICD-10-CM | POA: Diagnosis not present

## 2014-02-01 DIAGNOSIS — J4489 Other specified chronic obstructive pulmonary disease: Secondary | ICD-10-CM

## 2014-02-01 DIAGNOSIS — Z9851 Tubal ligation status: Secondary | ICD-10-CM

## 2014-02-01 DIAGNOSIS — F1210 Cannabis abuse, uncomplicated: Secondary | ICD-10-CM | POA: Diagnosis not present

## 2014-02-01 LAB — POCT I-STAT, CHEM 8
BUN: 8 mg/dL (ref 6–23)
Calcium, Ion: 1.22 mmol/L (ref 1.12–1.23)
Chloride: 97 mEq/L (ref 96–112)
Creatinine, Ser: 0.6 mg/dL (ref 0.50–1.10)
Glucose, Bld: 103 mg/dL — ABNORMAL HIGH (ref 70–99)
HEMATOCRIT: 30 % — AB (ref 36.0–46.0)
Hemoglobin: 10.2 g/dL — ABNORMAL LOW (ref 12.0–15.0)
POTASSIUM: 3.8 meq/L (ref 3.7–5.3)
SODIUM: 136 meq/L — AB (ref 137–147)
TCO2: 27 mmol/L (ref 0–100)

## 2014-02-01 MED ORDER — GABAPENTIN 300 MG PO CAPS
300.0000 mg | ORAL_CAPSULE | Freq: Three times a day (TID) | ORAL | Status: DC
  Administered 2014-02-01 – 2014-02-07 (×23): 300 mg via ORAL
  Filled 2014-02-01 (×27): qty 1

## 2014-02-01 MED ORDER — METHOCARBAMOL 500 MG PO TABS
500.0000 mg | ORAL_TABLET | Freq: Four times a day (QID) | ORAL | Status: DC | PRN
  Administered 2014-02-02 – 2014-02-20 (×22): 500 mg via ORAL
  Filled 2014-02-01 (×22): qty 1

## 2014-02-01 MED ORDER — BISACODYL 10 MG RE SUPP
10.0000 mg | Freq: Every day | RECTAL | Status: DC | PRN
  Administered 2014-02-01 – 2014-02-11 (×5): 10 mg via RECTAL
  Filled 2014-02-01 (×6): qty 1

## 2014-02-01 MED ORDER — HEPARIN SODIUM (PORCINE) 1000 UNIT/ML IJ SOLN
1000.0000 [IU] | Freq: Once | INTRAMUSCULAR | Status: DC
Start: 2014-02-01 — End: 2014-02-02
  Filled 2014-02-01: qty 1

## 2014-02-01 MED ORDER — ACETAMINOPHEN 325 MG PO TABS
325.0000 mg | ORAL_TABLET | ORAL | Status: DC | PRN
  Administered 2014-02-03 – 2014-02-11 (×5): 650 mg via ORAL
  Filled 2014-02-01 (×5): qty 2

## 2014-02-01 MED ORDER — GUAIFENESIN-DM 100-10 MG/5ML PO SYRP: 5.0000 mL | ORAL_SOLUTION | Freq: Four times a day (QID) | ORAL | Status: DC | PRN

## 2014-02-01 MED ORDER — CETYLPYRIDINIUM CHLORIDE 0.05 % MT LIQD
7.0000 mL | Freq: Two times a day (BID) | OROMUCOSAL | Status: DC
  Administered 2014-02-01 – 2014-02-21 (×37): 7 mL via OROMUCOSAL

## 2014-02-01 MED ORDER — ACD FORMULA A 0.73-2.45-2.2 GM/100ML VI SOLN
500.0000 mL | Status: DC
  Filled 2014-02-01: qty 500

## 2014-02-01 MED ORDER — LEVOTHYROXINE SODIUM 25 MCG PO TABS
25.0000 ug | ORAL_TABLET | Freq: Every day | ORAL | Status: DC
  Administered 2014-02-02 – 2014-02-21 (×20): 25 ug via ORAL
  Filled 2014-02-01 (×21): qty 1

## 2014-02-01 MED ORDER — CALCIUM CARBONATE ANTACID 500 MG PO CHEW
2.0000 | CHEWABLE_TABLET | ORAL | Status: AC
  Filled 2014-02-01 (×3): qty 2

## 2014-02-01 MED ORDER — OXYCODONE HCL 5 MG PO TABS
5.0000 mg | ORAL_TABLET | Freq: Four times a day (QID) | ORAL | Status: DC | PRN
  Administered 2014-02-01 – 2014-02-14 (×41): 10 mg via ORAL
  Administered 2014-02-15: 5 mg via ORAL
  Administered 2014-02-15 – 2014-02-21 (×16): 10 mg via ORAL
  Filled 2014-02-01 (×6): qty 2
  Filled 2014-02-01: qty 1
  Filled 2014-02-01 (×52): qty 2

## 2014-02-01 MED ORDER — ONDANSETRON HCL 4 MG PO TABS: 4.0000 mg | ORAL_TABLET | Freq: Four times a day (QID) | ORAL | Status: DC | PRN

## 2014-02-01 MED ORDER — DIPHENHYDRAMINE HCL 25 MG PO CAPS: 25.0000 mg | ORAL_CAPSULE | Freq: Four times a day (QID) | ORAL | Status: DC | PRN

## 2014-02-01 MED ORDER — NITROGLYCERIN 0.4 MG SL SUBL: 0.4000 mg | SUBLINGUAL_TABLET | SUBLINGUAL | Status: DC | PRN

## 2014-02-01 MED ORDER — TRAZODONE HCL 50 MG PO TABS: 25.0000 mg | ORAL_TABLET | Freq: Every evening | ORAL | Status: DC | PRN

## 2014-02-01 MED ORDER — POLYETHYLENE GLYCOL 3350 17 G PO PACK
17.0000 g | PACK | Freq: Every day | ORAL | Status: DC
  Administered 2014-02-02: 17 g via ORAL
  Filled 2014-02-01 (×2): qty 1

## 2014-02-01 MED ORDER — ALPRAZOLAM 0.5 MG PO TABS
0.5000 mg | ORAL_TABLET | Freq: Three times a day (TID) | ORAL | Status: DC | PRN
  Administered 2014-02-02 – 2014-02-06 (×7): 0.5 mg via ORAL
  Filled 2014-02-01 (×7): qty 1

## 2014-02-01 MED ORDER — ENOXAPARIN SODIUM 40 MG/0.4ML ~~LOC~~ SOLN
40.0000 mg | SUBCUTANEOUS | Status: DC
  Administered 2014-02-01 – 2014-02-20 (×20): 40 mg via SUBCUTANEOUS
  Filled 2014-02-01 (×23): qty 0.4

## 2014-02-01 MED ORDER — ALUM & MAG HYDROXIDE-SIMETH 200-200-20 MG/5ML PO SUSP: 30.0000 mL | ORAL | Status: DC | PRN

## 2014-02-01 MED ORDER — FLEET ENEMA 7-19 GM/118ML RE ENEM: 1.0000 | ENEMA | Freq: Once | RECTAL | Status: AC | PRN

## 2014-02-01 MED ORDER — TRAMADOL HCL 50 MG PO TABS
50.0000 mg | ORAL_TABLET | Freq: Four times a day (QID) | ORAL | Status: DC | PRN
  Administered 2014-02-02 – 2014-02-20 (×39): 50 mg via ORAL
  Filled 2014-02-01 (×40): qty 1

## 2014-02-01 MED ORDER — METOPROLOL SUCCINATE ER 100 MG PO TB24
100.0000 mg | ORAL_TABLET | Freq: Every day | ORAL | Status: DC
  Administered 2014-02-02: 100 mg via ORAL
  Filled 2014-02-01 (×3): qty 1

## 2014-02-01 MED ORDER — PANTOPRAZOLE SODIUM 40 MG PO TBEC
40.0000 mg | DELAYED_RELEASE_TABLET | Freq: Every day | ORAL | Status: DC
  Administered 2014-02-02 – 2014-02-21 (×20): 40 mg via ORAL
  Filled 2014-02-01 (×19): qty 1

## 2014-02-01 MED ORDER — ACETAMINOPHEN 325 MG PO TABS: 650.0000 mg | ORAL_TABLET | ORAL | Status: DC | PRN

## 2014-02-01 MED ORDER — ISOSORBIDE MONONITRATE ER 60 MG PO TB24
60.0000 mg | ORAL_TABLET | Freq: Every day | ORAL | Status: DC
  Administered 2014-02-02: 60 mg via ORAL
  Filled 2014-02-01 (×3): qty 1

## 2014-02-01 MED ORDER — ONDANSETRON HCL 4 MG/2ML IJ SOLN
4.0000 mg | Freq: Four times a day (QID) | INTRAMUSCULAR | Status: DC | PRN
Start: 2014-02-01 — End: 2014-02-21
  Filled 2014-02-01: qty 2

## 2014-02-01 NOTE — H&P (Signed)
Physical Medicine and Rehabilitation Admission H&P      Chief Complaint   Patient presents with   .  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       HPI: Crystal Stein is a 43 y.o. female with history of depression with anxiety, recent NSTEMI, who was readmitted on 01/16/14 with recurrent chest pain as well as confusion. CTA chest negative for PE and new hepatomegaly and hepatic steatosis noted. UDS positive for marijuana and benzos. Cardiology consulted and felt that symptoms due to psychosocial issues. Patient reported difficulty walking and well as BLE weakness on 07/29 and MRI lumbar spine without disc herniation or stenosis. 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 She was treated with a round of IVIG without much improvement. Patient with stable NIF/VC but with elevated ESR.  HD cath was placed by IVR and she was started on PLEX due to minimal response with IVIG. Confusion has resolved. Kleb UTI treated with cipro and foley placed due to urinary retention. Therapy ongoing and patient continues to have high levels of anxiety as well as fear with mobility. Noted to have BLE ataxia with instability as well as neuropathy with BUE affecting mobility. On 01/31/2014 patient had an episode of chest pain. EKG showed sinus tachycardia. Troponin was normal MD, therapy team recommending CIR to lessen burden of care and patient admitted today.      Review of Systems  HENT: Negative for hearing loss.   Eyes: Negative for double vision.  Respiratory: Negative for cough and wheezing.   Cardiovascular: Positive for palpitations.  Gastrointestinal: Negative for heartburn, nausea, abdominal pain and constipation.  Genitourinary:        Foley in place  Musculoskeletal: Positive for back pain  and myalgias.  Neurological: Positive for tingling, sensory change, focal weakness and weakness.       Past Medical History   Diagnosis  Date   .  Anxiety     .  Depression     .  Asthma     .  Edentulism, partial  10/07       Full upper extraction    .  COPD (chronic obstructive pulmonary disease)     .  GERD (gastroesophageal reflux disease)     .  Bipolar disorder     .  Blood transfusion  2006   .  History of cardiac catheterization  July 2015       Angiographically normal coronary arteries with distal tortuosity   .  NSTEMI (non-ST elevated myocardial infarction)         Not secondary to obstructive CAD or obvious plaque rupture, possibly vasospasm       Past Surgical History   Procedure  Laterality  Date   .  Tennis elbow release    04/27/06       Left; [facility], [facility]   .  Multiple tooth extractions    10/07       Full upper   .  Orif proximal humerus fracture    08/06       Left,   .  Dilation and curettage of uterus    June 2012       Failed ablation   .  Tubal ligation    June 2012  [facility]   .  Bipolar disorder       .  Hysteroscopy    01/27/2011       Procedure: HYSTEROSCOPY;  Surgeon: Jonnie Kind, MD;  Location: [facility];  Service: Gynecology;  Laterality: N/A;   .  Vaginal hysterectomy    05/19/2011       Procedure: HYSTERECTOMY VAGINAL;  Surgeon: Jonnie Kind, MD;  Location: [facility];  Service: Gynecology;  Laterality: N/A;   .  Orif wrist fracture    06/17/2012       Procedure: OPEN REDUCTION INTERNAL FIXATION (ORIF) WRIST FRACTURE;  Surgeon: Carole Civil, MD;  Location: [facility];  Service: Orthopedics;  Laterality: Left;   .  Carpal tunnel release    06/17/2012       Procedure: CARPAL TUNNEL RELEASE;  Surgeon: Carole Civil, MD;  Location: [facility];  Service: Orthopedics;  Laterality: Left;       Family History   Problem  Relation  Age of Onset   .  Anesthesia problems  Neg Hx     .  Hypotension  Neg Hx     .  Malignant hyperthermia  Neg  Hx     .  Pseudochol deficiency  Neg Hx     .  Diabetes          Social History: Separated from husband and returned to [place] few weeks ago for court date/custody issues. Living with parents and brother currently. She reports that she has been smoking Cigarettes < 1PPD. She has a 22 pack-year smoking history. She does not have any smokeless tobacco history on file. She reports that she does not drink alcohol. Uses marijuana occasionally.      Allergies: No Known Allergies      Medications Prior to Admission   Medication  Sig  Dispense  Refill   .  acetaminophen (TYLENOL) 500 MG tablet  Take 500 mg by mouth every 6 (six) hours as needed for mild pain or moderate pain.         .  isosorbide mononitrate (IMDUR) 30 MG 24 hr tablet  Take 1 tablet (30 mg total) by mouth daily.   30 tablet   5   .  metoprolol tartrate (LOPRESSOR) 25 MG tablet  Take 1 tablet (25 mg total) by mouth 2 (two) times daily.   60 tablet   5   .  nitroGLYCERIN (NITROSTAT) 0.4 MG SL tablet  Place 1 tablet (0.4 mg total) under the tongue every 5 (five) minutes x 3 doses as needed for chest pain.   25 tablet   12   .  omeprazole (PRILOSEC) 20 MG capsule  Take 20 mg by mouth daily.              Home: Home Living Family/patient expects to be discharged to:: Inpatient rehab Living Arrangements: Parent Additional Comments: Pt currently staying at parents house due to custody issues with children. pt plans to return to [place] residence at some point.     Functional History: Prior Function Level of Independence: Independent Gait / Transfers Assistance Needed: independent with progressive weekness with x1 week PTA of bed level care   Functional Status:   Mobility: Bed Mobility Overal bed mobility: Needs Assistance Bed Mobility: Rolling;Supine to Sit Rolling: Max assist Sidelying to sit: Mod assist Supine to sit: Max assist Sit to supine: Mod assist Sit to sidelying: Mod assist;+2 for physical  assistance General bed mobility comments: pt unable to  grip with hands, able to initiate reaching and LE mvmt however requires maxA to achieve complete sidelying L/R, maxAx2 for supine to sit EOB Transfers Overall transfer level: Needs assistance Equipment used:  (2 person lift with bed pad) Transfers: Sit to/from [employer] Transfers Sit to Stand: Max assist;+2 physical assistance Stand pivot transfers: Max assist;+2 physical assistance General transfer comment: pt able to lock elbows to assist with transfer but with limited ability to pushed through LEs due to significant impaired bilat LE sensation. pt unable to sequence stepping pattern. used 2 person lift with bed pad to transfer pt to chair Ambulation/Gait Gait velocity: Unable to attempt at this time.   General Gait Details: Unable    ADL: ADL Overall ADL's : Needs assistance/impaired Eating/Feeding: Maximal assistance;Sitting Eating/Feeding Details (indicate cue type and reason): Pt unable to grasp utensil with UEs.  Pt provided with U-cuff and lidded mug.  She demonstrates significant difficulty using AE as fingers stay extended, and maintains wrist flexion.  Fatigued quickly   Grooming: Oral care;Wash/dry face;Min guard;Sitting Grooming Details (indicate cue type and reason): Pt sitting in stedy at sink level to wash face and complete oral care. Pt demonstrates good core strength   Toilet Transfer: +2 for physical assistance;Maximal assistance Functional mobility during ADLs: +2 for physical assistance;Maximal assistance (stedy ) General ADL Comments: Pt sitting up in chair with complaint of fatigue.  She now has Rt wrist extension 2-/5.  Pt has IV in Rt forearm which will interfere with use of wrist splint.  U-cuff with wrist support placed on Rt hand for time being which provided support and improved pt's ability to use Rt UE as a gross assist.  Pt to have PICC line today so hopefully will be able to order off the shelf  wrist splint.  If IV remains, may need to fabricate a thermoplastic splint.  With U-cuff wrist support brace in place, pt able to drink from lidded mug using bil. UEs, but she fatigues quickly.  She initially required min A, but progressed to supervision    Cognition: Cognition Overall Cognitive Status: Within Functional Limits for tasks assessed Orientation Level: Oriented X4 Cognition Arousal/Alertness: Awake/alert Behavior During Therapy: Anxious Overall Cognitive Status: Within Functional Limits for tasks assessed   Physical Exam: Blood pressure 112/66, pulse 109, temperature 98.8 F (37.1 C), temperature source Oral, resp. rate 18, height 5' 4.5" (1.638 m), weight 49.6 kg (109 lb 5.6 oz), last menstrual period 04/23/2011, SpO2 99.00%. Physical Exam  Nursing note and vitals reviewed. Constitutional: She is oriented to person, place, and time.  Frail appearing female. Appears older than stated age.   HENT:   Head: Normocephalic and atraumatic.  Edentulous.   Eyes: Conjunctivae are normal. Pupils are equal, round, and reactive to light.  Neck: Normal range of motion. Neck supple.  Cardiovascular: Regular rhythm.  Tachycardia present.   Diatek cath right chest wall.   Respiratory: Effort normal and breath sounds normal. No respiratory distress. She has no wheezes.  GI: Soft. Bowel sounds are normal.  Musculoskeletal: She exhibits no edema and no tenderness.  Neurological: She is alert and oriented to person, place, and time.  Hypersensitivity chest wall and dysesthesias RLE>LLE.  Marland Kitchen Decreased sensation in both hand and LE's. DTR's absent. Cognition appropriate      motor exam: 3/5 bilateral deltoids 3 minus/5 bilateral bicep 2 minus/5 bilateral tricep 3 minus/5 left finger flexors Trace right finger flexors 0/5 bilateral wrist extensors 2 minus/5 right hip flexors Trace left hip flexors Trace  bilateral knee extensors 2 minus/5 bilateral hip extensors Trace right total  flexion and extension 0/5 left total flexion and extension  Skin: Skin is warm and dry.  Psychiatric: Thought content normal. Her mood appears anxious. Cognition and memory are normal.     Results for orders placed during the hospital encounter of 01/16/14 (from the past 48 hour(s))   HEPATIC FUNCTION PANEL     Status: Abnormal     Collection Time      01/29/14  9:10 AM       Result  Value  Ref Range     Total Protein  7.2   6.0 - 8.3 g/dL     Albumin  2.0 (*)  3.5 - 5.2 g/dL     AST  95 (*)  0 - 37 U/L     ALT  35   0 - 35 U/L     Alkaline Phosphatase  179 (*)  39 - 117 U/L     Total Bilirubin  0.5   0.3 - 1.2 mg/dL     Bilirubin, Direct  <0.2   0.0 - 0.3 mg/dL     Indirect Bilirubin  NOT CALCULATED   0.3 - 0.9 mg/dL   POCT I-STAT, CHEM 8     Status: Abnormal     Collection Time      01/29/14  9:09 PM       Result  Value  Ref Range     Sodium  134 (*)  137 - 147 mEq/L     Potassium  3.4 (*)  3.7 - 5.3 mEq/L     Chloride  99   96 - 112 mEq/L     BUN  8   6 - 23 mg/dL     Creatinine, Ser  0.40 (*)  0.50 - 1.10 mg/dL     Glucose, Bld  109 (*)  70 - 99 mg/dL     Calcium, Ion  1.21   1.12 - 1.23 mmol/L     TCO2  24   0 - 100 mmol/L     Hemoglobin  10.5 (*)  12.0 - 15.0 g/dL     HCT  31.0 (*)  36.0 - 46.0 %    Ir Fluoro Guide Cv Line Right   01/29/2014   CLINICAL DATA:  Guillain Barre  EXAM: TUNNELED DIALYSIS CATHETER PLACEMENT, ULTRASOUND GUIDANCE FOR VASCULAR ACCESS  FLUOROSCOPY TIME:  18 seconds.  MEDICATIONS AND MEDICAL HISTORY: Versed 1.5 mg, Fentanyl 50 mcg.  As antibiotic prophylaxis, Ancef was ordered pre-procedure and administered intravenously within one hour of incision.  ANESTHESIA/SEDATION: Moderate sedation time: 20 minutes  CONTRAST:  None  PROCEDURE: The procedure, risks, benefits, and alternatives were explained to the patient. Questions regarding the procedure were encouraged and answered. The patient understands and consents to the procedure.  The right neck was  prepped with Betadine in a sterile fashion, and a sterile drape was applied covering the operative field. A sterile gown and sterile gloves were used for the procedure. 1% lidocaine into the skin and subcutaneous tissue. The right internal jugular vein was noted to be patent initially with ultrasound. Under sonographic guidance, a micropuncture needle was inserted into the right IJ vein (Ultrasound and fluoroscopic image documentation was performed). It was removed over an 018 wire which was upsized to an Amplatz. This was advanced into the IVC.  A small incision was made in the right upper chest. The tunneling device was utilized to advance the 23 centimeter tip  to cuff catheter from the chest incision and out the neck incision. A peel-away sheath was advanced over the Amplatz wire. The leading edge of the catheter was then advanced through the peel-away sheath. The peel-away sheath was removed. It was flushed and instilled with heparin. The chest incision was closed with a 0 Prolene pursestring stitch. The neck incision was closed with a 4-0 Vicryl subcuticular stitch. No complications.  FINDINGS: The image demonstrates placement of a tunneled dialysis catheter with its tip in the right atrium.  IMPRESSION: Successful right IJ vein tunneled dialysis catheter with its tip in the right atrium.   Electronically Signed   By: Maryclare Bean M.D.   On: 01/29/2014 15:51          Medical Problem List and Plan: 1. 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  DVT Prophylaxis/Anticoagulation: Pharmaceutical: Lovenox 3. Neuropathy/Pain Management: Increase Neurontin to 300 mg qid.   4. Bipolar disorder/Mood: Team to provide ego support. Will have LCSW follow for evaluation and support. Neuro psych evaluation also. Will change valium to xanax prn 5. Neuropsych: This patient is capable of making decisions on her own behalf. 6. Skin/Wound Care: Pressure relief measures to prevent breakdown. Encourage boosting when [place]. Has  very low protein stores--add nutritional supplement. 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 To continue qod  X 4 per neurology.   8. Hyponatremia: Will recheck in am. 9. Hypokalemia: Supplement and recheck in am.   10. Neurogenic bladder: Will leave foley in place till mobility improves. Recheck UCS.  11. Mild anemia: recheck CBC in am.   12. Abnormal LFTs: Hepatitis panel negative. Will recheck in am.     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 Patient is admitted to receive collaborative, interdisciplinary care between the physiatrist, rehab nursing staff, and therapy team. Patient's level of medical complexity and substantial therapy needs in context of that medical necessity cannot be provided at a lesser intensity of care such as a SNF. Patient has experienced substantial functional loss from his/her baseline which was documented above under the "Functional History" and "Functional Status" headings.  Judging by the patient's diagnosis, physical exam, and functional history, the patient has potential for functional progress which will result in measurable gains while on inpatient rehab.  These gains will be of substantial and practical use upon discharge  in facilitating mobility and self-care at the household level. Physiatrist will provide 24 hour management of medical needs as well as oversight of the therapy plan/treatment and provide guidance as appropriate regarding the interaction of the two. 24 hour rehab nursing will assist with bladder management, bowel management, safety, skin/wound care, disease management, medication administration, pain management and patient education  and help integrate therapy concepts, techniques,education, etc. PT will assess and treat for/with: pre gait, gait training, endurance , safety, equipment, neuromuscular re  education Balance.   Goals are: min/modA. OT will assess and treat for/with: ADLs, Cognitive perceptual skills, Neuromuscular re education, safety, endurance, equipment,balance.   Goals are: min/mod A. SLP will assess and treat for/with: NA.  Goals are: NA. Case Management and Social Worker will assess and treat for psychological issues and discharge planning. Team conference will be held weekly to assess progress toward goals and to determine barriers to discharge. Patient will receive at least 3 hours of therapy per day at least 5 days per week. ELOS: 22-28days  Prognosis:  good  Charlett Blake M.D. [facility] FAAPM&R (Sports Med, Neuromuscular Med) Diplomate Am Board of Electrodiagnostic Med  02/01/2014

## 2014-02-01 NOTE — Progress Notes (Signed)
VC 2.3 L, NIF -50 Pt gave a good effor

## 2014-02-01 NOTE — Discharge Summary (Signed)
Patient was supposed to be discharged yesterday, 01/31/2014 to CIR, however, she developed chest pain.  Her EKG did not show any changes from prior EKGs and troponin was negative.  Upon further discussion with the patient, it was likely due to anxiety.  Patient is no longer having complaints of chest pain.    She also had her first plasmapheresis treatment on 01/31/2014, and she completed the session with no complaints or adverse events.    Patient is stable for discharge.  Please see full discharge summary dictated on 01/31/2014.  No changes.   Time spent: 15 minutes  Jaevion Goto D.O. [facility] Pager [phone]  If 7PM-7AM, please contact night-coverage [email or web address] Password [email or web address] 02/01/2014, 10:31 AM

## 2014-02-01 NOTE — Progress Notes (Signed)
Rehab admissions - I am following pt's case for my partner, Genie and have communicated with Dr. Catha GosselinMikhail about pt's status.  Per MD: "Patient was supposed to be discharged yesterday, 01/31/2014 to CIR, however, she developed chest pain.  Her EKG did not show any changes from prior EKGs and troponin was negative.  Upon further discussion with the patient, it was likely due to anxiety.  Patient is no longer having complaints of chest pain.    She also had her first plasmapheresis treatment on 01/31/2014, and she completed the session with no complaints or adverse events.    Patient is stable for discharge."  I spoke with pt and she is agreeable to coming to inpatient rehab. I then called and updated pt's father about the plan for inpatient rehab today. He is in agreement as well.  I updated Steward DroneBrenda with case management, pt's RN and Irving Burtonmily with social work as well.   Pt will be admitted to inpatient rehab later today. Please call me with any questions. Thanks.  Juliann MuleJanine Daquavion Catala, PT Rehabilitation Admissions Coordinator [phone]

## 2014-02-01 NOTE — Progress Notes (Signed)
Patient information reviewed and entered into eRehab system by Nola Botkins, RN, CRRN, PPS Coordinator.  Information including medical coding and functional independence measure will be reviewed and updated through discharge.    

## 2014-02-01 NOTE — Progress Notes (Signed)
Subjective: Patient feels no improvement after her first dose of PLX NIF -46 VC2.1  Objective: Current vital signs: BP 92/74  Pulse 77  Temp(Src) 98.7 F (37.1 C) (Oral)  Resp 18  Ht 5' 4.5" (1.638 m)  Wt 51.6 kg (113 lb 12.1 oz)  BMI 19.23 kg/m2  SpO2 99%  LMP 04/23/2011 Vital signs in last 24 hours: Temp:  [97.1 F (36.2 C)-99.4 F (37.4 C)] 98.7 F (37.1 C) (08/13 0932) Pulse Rate:  [77-110] 77 (08/13 0932) Resp:  [12-23] 18 (08/13 0932) BP: (85-115)/(37-88) 92/74 mmHg (08/13 0932) SpO2:  [95 %-100 %] 99 % (08/13 0932) Weight:  [51.6 kg (113 lb 12.1 oz)] 51.6 kg (113 lb 12.1 oz) (08/12 1545)  Intake/Output from previous day: 08/12 0701 - 08/13 0700 In: 840 [P.O.:840] Out: 400 [Urine:400] Intake/Output this shift: Total I/O In: -  Out: 900 [Urine:900] Nutritional status: General  Neurologic Exam: General:NAD  Mental Status:  Alert, oriented, thought content appropriate. Speech fluent without evidence of aphasia. Able to follow 3 step commands without difficulty.  Cranial Nerves:  FA:OZHYQMI:Visual fields grossly normal, pupils equal, round, reactive to light and accommodation  III,IV, VI: ptosis not present, extra-ocular motions intact bilaterally  V,VII: smile symmetric, facial light touch sensation normal bilaterally  VIII: hearing normal bilaterally  IX,X: gag reflex present  XI: bilateral shoulder shrug  XII: midline tongue extension without atrophy or fasciculations  Motor:  Right :  Upper extremity 4/5 -2/5 distally Left:  Upper extremity 4/5---2/5 distally   Lower extremity 5/5 -2/5 distally   Lower extremity 5/5---2/5 distally  Tone and bulk:normal tone throughout; no atrophy noted  Sensory: Pinprick and light touch vibration decreased from feet to T4 level and decreased to LT and vibration from fingers to elbow  Deep Tendon Reflexes:  0/4 throughout  Plantars:  Mute bilatearlly   Lab Results: Basic Metabolic Panel:  Recent Labs Lab 01/26/14 0315  01/27/14 0426 01/29/14 2109  NA 131* 133* 134*  K 3.7 4.0 3.4*  CL 95* 95* 99  CO2 23 24  --   GLUCOSE 84 82 109*  BUN 10 10 8   CREATININE 0.45* 0.36* 0.40*  CALCIUM 8.7 8.7  --   MG 1.5  --   --     Liver Function Tests:  Recent Labs Lab 01/26/14 0315 01/29/14 0910 01/31/14 0459  AST 100* 95* 63*  ALT 34 35 26  ALKPHOS 210* 179* 151*  BILITOT 0.8 0.5 0.5  PROT 7.6 7.2 7.1  ALBUMIN 2.0* 2.0* 2.2*   No results found for this basename: LIPASE, AMYLASE,  in the last 168 hours No results found for this basename: AMMONIA,  in the last 168 hours  CBC:  Recent Labs Lab 01/26/14 0315 01/29/14 2109  WBC 5.7  --   NEUTROABS 1.3*  --   HGB 9.9* 10.5*  HCT 29.0* 31.0*  MCV 94.5  --   PLT 318  --     Cardiac Enzymes:  Recent Labs Lab 01/31/14 1341  TROPONINI <0.30    Lipid Panel: No results found for this basename: CHOL, TRIG, HDL, CHOLHDL, VLDL, LDLCALC,  in the last 168 hours  CBG: No results found for this basename: GLUCAP,  in the last 168 hours  Microbiology: Results for orders placed during the hospital encounter of 01/16/14  URINE CULTURE     Status: None   Collection Time    01/18/14  9:25 AM      Result Value Ref Range Status   Specimen Description  URINE, CLEAN CATCH   Final   Special Requests NONE   Final   Culture  Setup Time     Final   Value: 01/18/2014 19:44     Performed at [facility] Count     Final   Value: >=100,000 COLONIES/ML     Performed at [facility]   Culture     Final   Value: KLEBSIELLA PNEUMONIAE     Performed at [facility]   Report Status 01/20/2014 FINAL   Final   Organism ID, Bacteria KLEBSIELLA PNEUMONIAE   Final  CSF CULTURE     Status: None   Collection Time    01/20/14  1:35 PM      Result Value Ref Range Status   Specimen Description CSF   Final   Special Requests NONE   Final   Gram Stain     Final   Value: CYTOSPIN NO WBC SEEN     NO ORGANISMS SEEN     Performed at  [facility]   Culture     Final   Value: NO GROWTH 3 DAYS     Performed at [facility]   Report Status 01/23/2014 FINAL   Final    Coagulation Studies: No results found for this basename: LABPROT, INR,  in the last 72 hours  Imaging: No results found.  Medications:  Scheduled: . antiseptic oral rinse  7 mL Mouth Rinse BID  . docusate sodium  100 mg Oral BID  . gabapentin  300 mg Oral TID  . heparin subcutaneous  5,000 Units Subcutaneous 3 times per day  . isosorbide mononitrate  60 mg Oral Daily  . levothyroxine  25 mcg Oral QAC breakfast  . metoprolol succinate  100 mg Oral Daily  . pantoprazole  40 mg Oral Daily  . polyethylene glycol  17 g Oral Daily    Assessment/Plan: 43 YO female with length dependant loss of large fiber > small fiber function(by exam) consistent with an acute neuropathy such as AIDP. Patient has received 5 doses of IVIG with no improvement. PLEX was given on 8/12 and she is schedualed for 4 more doses (every other day).   Recommendations:  1) continue PLEX qod for 4 treatments    Felicie Morn PA-C [facility] [phone]  02/01/2014, 10:29 AM

## 2014-02-01 NOTE — PMR Pre-admission (Signed)
PMR Admission Coordinator Pre-Admission Assessment (Addendum)  Patient: Crystal Stein is an 43 y.o., female  MRN: [id number]  DOB: 1970-12-09  Height: 5' 4.5" (163.8 cm)  Weight: 49.6 kg (109 lb 5.6 oz)   Insurance Information  Self pay - no insurance coverage   Medicaid Application Date: Case Manager:  Pt/family may need help with medicaid application.    Emergency Contact Information  Contact Information    Name  Relation  Home  Work  Randall  Father  [phone]      Namon Cirri  Mother    [phone]      Current Medical History  Patient Admitting Diagnosis: GBS   History of Present Illness: A 43 y.o. female with history of depression with anxiety, recent NSTEMI, who was readmitted on 01/16/14 with recurrent chest pain as well as confusion. CTA chest negative for PE and new hepatomegaly and hepatic steatosis noted. UDS positive for marijuana and benzos. Cardiology consulted and felt that symptoms due to psychosocial issues. Patient reported difficulty walking and well as BLE weakness on 07/29 and MRI lumbar spine without disc herniation or stenosis. 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 She was started on IVIG for treatment and has had complaints of worsening of numbness. Patient with stable NIF/VC but with elevated ESR. HD cath was placed by IVR and she was started on PLEX due to minimal response with IVIG. Patient received first attempted PLEX on 01/29/14, but it had to be stopped prior to completion. Her second PLEX treatment is scheduled for 01/31/14. Confusion resolving. Kleb UTI treated with cipro and foley placed due to urinary retention. PT initiated and patient limited by significant weakness. CIR recommended by MD and  rehab team.   Note per Dr. Ree Kida on 02-01-14: Patient was supposed to be discharged yesterday, 01/31/2014 to CIR, however, she developed chest pain. Her EKG did not show any changes from prior EKGs and troponin was negative. Upon further discussion with the patient, it was likely due to anxiety. Patient is no longer having complaints of chest pain.  She also had her first plasmapheresis treatment on 01/31/2014, and she completed the session with no complaints or adverse events. Patient is stable for discharge from [facility]. Pt will be admitted to inpatient rehab today on 02-01-14.    Past Medical History  Past Medical History   Diagnosis  Date   .  Anxiety    .  Depression    .  Asthma    .  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  COPD (chronic obstructive pulmonary disease)    .  GERD (gastroesophageal reflux disease)    .  Bipolar disorder    .  Blood transfusion  2006   .  History of cardiac catheterization  July 2015     Angiographically normal coronary arteries with distal tortuosity   .  NSTEMI (non-ST elevated myocardial infarction)      Not secondary to obstructive CAD or obvious plaque rupture, possibly vasospasm    Family History  family history includes Diabetes in an other family member. There is no history of Anesthesia problems, Hypotension, Malignant hyperthermia, or Pseudochol deficiency.   Prior Rehab/Hospitalizations: Had outpatient therapy about 5 yrs ago  for "fractures".   Current Medications  Current facility-administered medications:acetaminophen (TYLENOL) tablet 500 mg, 500 mg, Oral, Q6H PRN, Doree Albee, MD, 500 mg at 01/29/14 0039; antiseptic oral rinse (CPC / CETYLPYRIDINIUM CHLORIDE 0.05%) solution 7 mL, 7 mL, Mouth Rinse, BID, Allie Bossier, MD, 7 mL at 01/31/14 1000; calcium gluconate 4 g in sodium chloride 0.9 % 250 mL IVPB, 4 g, Intravenous, Once, Roland Rack, MD  diazepam (VALIUM) tablet 5 mg, 5 mg, Oral, Q6H PRN, Kathie Dike, MD, 5 mg at 01/30/14 2152; docusate sodium (COLACE) capsule 100 mg, 100 mg, Oral, BID, Samella Parr, NP, 100 mg at 01/31/14 7824; gabapentin (NEURONTIN) capsule 300 mg, 300 mg, Oral, TID, Roland Rack, MD, 300 mg at 01/31/14 0943; heparin injection 5,000 Units, 5,000 Units, Subcutaneous, 3 times per day, Georgina Peer, RPH, 5,000 Units at 01/31/14 0531  isosorbide mononitrate (IMDUR) 24 hr tablet 60 mg, 60 mg, Oral, Daily, Cherene Altes, MD, 60 mg at 01/31/14 2353; levothyroxine (SYNTHROID, LEVOTHROID) tablet 25 mcg, 25 mcg, Oral, QAC breakfast, Allie Bossier, MD, 25 mcg at 01/31/14 [phone]; metoprolol succinate (TOPROL-XL) 24 hr tablet 100 mg, 100 mg, Oral, Daily, Cherene Altes, MD, 100 mg at 01/31/14 0943  nitroGLYCERIN (NITROSTAT) SL tablet 0.4 mg, 0.4 mg, Sublingual, Q5 Min x 3 PRN, Nimish C Gosrani, MD; ondansetron (ZOFRAN) injection 4 mg, 4 mg, Intravenous, Q6H PRN, Nimish C Gosrani, MD, 4 mg at 01/19/14 1654; ondansetron (ZOFRAN) tablet 4 mg, 4 mg, Oral, Q6H PRN, Doree Albee, MD, 4 mg at 01/27/14 0827; oxyCODONE (Oxy IR/ROXICODONE) immediate release tablet 5-10 mg, 5-10 mg, Oral, Q6H PRN, Samella Parr, NP, 10 mg at 01/31/14 0530  pantoprazole (PROTONIX) EC tablet 40 mg, 40 mg, Oral, Daily, Nimish C Gosrani, MD, 40 mg at 01/31/14 0943; polyethylene glycol (MIRALAX / GLYCOLAX) packet 17 g, 17 g, Oral, Daily, Charlynne Cousins, MD, 17 g at 01/30/14 3154   Patients Current Diet: General   Precautions / Restrictions  Precautions  Precautions: Fall  Precaution Comments: reports a fall recently; unable to state when the fall was  Restrictions  Weight Bearing Restrictions: No   Prior Activity Level  Community (5-7x/wk): Went out daily. Did not work. Did drive.   Home Assistive Devices / Equipment  Home Assistive Devices/Equipment: None   Prior Functional Level  Prior Function  Level of Independence: Independent  Gait / Transfers Assistance Needed:  independent with progressive weekness with x1 week PTA of bed level care   Current Functional Level   Cognition  Overall Cognitive Status: Impaired/Different from baseline  Orientation Level: Oriented X4   Extremity Assessment  (includes Sensation/Coordination)  Lower Extremity Assessment: Generalized weakness;RLE deficits/detail;LLE deficits/detail  RLE Deficits / Details: Limited testing seconary to complaints of severe pain in (B) legs. Pt was able to actively complete heel slides to put on socks without limitations.  LLE Deficits / Details: Limited testing secondary to complaints of severe pain in (B) legs. Pt was able to actively complete heel slides to put on socks without limitations.   ADLs  Overall ADL's : Needs assistance/impaired  Eating/Feeding: Moderate assistance;Sitting;Bed level  Eating/Feeding Details (indicate cue type and reason): Pt with wrist cock up splint on Rt UE and universal cuff placed in palm Pt is able to use shoulder flexion to 90 degrees to scoop food against bowel and bring to mouth. pt requires shoulder elevation to scoop food; Pt reports fatigue at shoulder with efforts to self feed. Pt spilling jello but  able to self feed 50% of container without (A) min guard. Pt requires universal cuff to complete any feeding. Pt demonstrates LT hand (A) durnig session to stabilize Rt  Grooming: Wash/dry face;Minimal assistance  Grooming Details (indicate cue type and reason): Pt sitting in stedy at sink level to wash face and complete oral care. Pt demonstrates good core strength  Toilet Transfer: +2 for physical assistance;Maximal assistance  Functional mobility during ADLs: +2 for physical assistance;Maximal assistance (stedy )  General ADL Comments: Pt remained bed level due to fatigue. Pt session focused on AE use and self feeding with AE. pt compensating with Lt lateral lean to help scoop Rt UE with cuff   Mobility  Overal bed mobility: Needs Assistance  Bed Mobility:  Supine to Sit  Rolling: Max assist  Sidelying to sit: Mod assist  Supine to sit: Max assist;HOB elevated  Sit to supine: Mod assist  Sit to sidelying: Mod assist;+2 for physical assistance  General bed mobility comments: pt attempted to grip handrails with Rt UE and (A) with bed mobility but was limited by pain; minimally moves bil LEs but with ataxic like movements; use of draw pad to bring hips and trunk to upright sitting position   Transfers  Overall transfer level: Needs assistance  Equipment used: 2 person hand held assist  Transfers: Sit to/from [employer] Transfers  Sit to Stand: Max assist;+2 physical assistance  Stand pivot transfers: Max assist;+2 physical assistance  General transfer comment: requires bil LEs blocking to prevent buckling; cues to lean anteriorly; pt tends to push posteriorly and states "i just dont want to fall" pt very anxious of transfers due to recent falls; max encouragement throughout session and pt was able to lean anteriorly and did not resist transfer   Ambulation / Gait / Stairs / Wheelchair Mobility  Ambulation/Gait  Gait velocity: Unable to attempt at this time.  General Gait Details: unablet o ambulate; difficulty weightbearing; bil knees buckling   Posture / Balance  Dynamic Sitting Balance  Sitting balance - Comments: progressed to supervision while sitting EOB pt unable to WB through bil UEs due to pain in wrists; tolerated sitting EOB ~10 min; no c/o dizziness; able to raise shoulders out BOS minimally but required min guard with dynamic activities   Special needs/care consideration  BiPAP/CPAP No  CPM No  Continuous Drip IV No  Dialysis No Days  Life Vest No  Oxygen No  Special Bed No  Trach Size No  Wound Vac (area) No  Skin Bruising arms and groin  Bowel mgmt: Documented bowel incontinence on 01/30/14  Bladder mgmt: Foley catheter for urinary retention  Diabetic mgmt No   Previous Home Environment  Living Arrangements: Parent   Home Care Services: No  Additional Comments: Pt currently staying at parents house due to custody issues with children. pt plans to return to [place] residence at some point.   Discharge Living Setting  Plans for Discharge Living Setting: House;Lives with (comment) (Currently living with parents.)  Type of Home at Discharge: House  Discharge Home Layout: One level  Discharge Home Access: Stairs to enter  Entrance Stairs-Number of Steps: 4-5 step entry.  Does the patient have any problems obtaining your medications?: No   Social/Family/Support Systems  Patient Roles: Spouse;Parent (Separated from husband and has 3 children.)  Contact Information: Bertram Denver - father [phone]  Anticipated Caregiver: Mom can provide supervision and get patient something to eat  Anticipated Caregiver's Contact Information: Mikle Bosworth - mom [phone]  Ability/Limitations  of Caregiver: Mom not working and can provide supervision but no physical assistance. Dad works.  Caregiver Availability: 24/7  Discharge Plan Discussed with Primary Caregiver: Yes  Is Caregiver In Agreement with Plan?: Yes  Does Caregiver/Family have Issues with Lodging/Transportation while Pt is in Rehab?: No   Goals/Additional Needs  Patient/Family Goal for Rehab: PT/OT Supervision  Expected length of stay: 16-22 days  Cultural Considerations: None  Dietary Needs: Heart diet, thin liquids  Equipment Needs: TBD  Additional Information: Husband has custody of children. Has a 42 yo son, 34 yo daughter and a 28 yo daughter. [place]  Pt/Family Agrees to Admission and willing to participate: Yes  Program Orientation Provided & Reviewed with Pt/Caregiver Including Roles & Responsibilities: Yes   Decrease burden of Care through IP rehab admission: N/A   Possible need for SNF placement upon discharge: Not planned, but if no progress, may need SNF prior to home with parents. Expect  patient will need SNF if she does not progress to supervision level by the time of discharge.   Patient Condition: This patient's medical and functional status has changed since the consult dated:01/23/14 in which the Rehabilitation Physician determined and documented that the patient's condition is appropriate for intensive rehabilitative care in an inpatient rehabilitation facility. See "History of Present Illness" (above) for medical update. Functional changes are: Currently requiring max assist +2 for stand pivot transfers. Unable to ambulate due to bil. Knees buckling. Patient's medical and functional status update has been discussed with the Rehabilitation physician and patient remains appropriate for inpatient rehabilitation. Will admit to inpatient rehab today.   Preadmission Screen Completed By: Retta Diones, 01/31/2014 11:32 AM  Addendum completed by Nanetta Batty, PT on 02-01-14 at 10:54  ______________________________________________________________________  Discussed status with Dr. Letta Pate on 02/01/14 at 1054 and received telephone approval for admission today.  Admission Coordinator: Nanetta Batty, PT, time1054/Date08/13/15

## 2014-02-01 NOTE — Progress Notes (Signed)
Pt transferred to Rehab from [facility]. Alert and orientated x 4. Orientated to rehab expectations, therapy schedule, etc. Pt is anticipating starting therapy in the morning.

## 2014-02-02 ENCOUNTER — Inpatient Hospital Stay (HOSPITAL_COMMUNITY): Payer: Medicaid Other

## 2014-02-02 ENCOUNTER — Inpatient Hospital Stay (HOSPITAL_COMMUNITY): Payer: Medicaid Other | Admitting: Occupational Therapy

## 2014-02-02 LAB — COMPREHENSIVE METABOLIC PANEL
ALBUMIN: 3.4 g/dL — AB (ref 3.5–5.2)
ALT: 16 U/L (ref 0–35)
ANION GAP: 12 (ref 5–15)
AST: 56 U/L — ABNORMAL HIGH (ref 0–37)
Alkaline Phosphatase: 74 U/L (ref 39–117)
BUN: 10 mg/dL (ref 6–23)
CALCIUM: 9 mg/dL (ref 8.4–10.5)
CO2: 24 mEq/L (ref 19–32)
CREATININE: 0.38 mg/dL — AB (ref 0.50–1.10)
Chloride: 102 mEq/L (ref 96–112)
GFR calc non Af Amer: >90 mL/min (ref >90)
GLUCOSE: 87 mg/dL (ref 70–99)
Potassium: 3.4 mEq/L — ABNORMAL LOW (ref 3.7–5.3)
Sodium: 138 mEq/L (ref 137–147)
TOTAL PROTEIN: 5.8 g/dL — AB (ref 6.0–8.3)
Total Bilirubin: 0.5 mg/dL (ref 0.3–1.2)

## 2014-02-02 LAB — CBC WITH DIFFERENTIAL/PLATELET
BASOS PCT: 1 % (ref 0–1)
Basophils Absolute: 0.1 10*3/uL (ref 0.0–0.1)
EOS PCT: 2 % (ref 0–5)
Eosinophils Absolute: 0.1 10*3/uL (ref 0.0–0.7)
HCT: 30.1 % — ABNORMAL LOW (ref 36.0–46.0)
HEMOGLOBIN: 10.1 g/dL — AB (ref 12.0–15.0)
LYMPHS ABS: 3.4 10*3/uL (ref 0.7–4.0)
Lymphocytes Relative: 51 % — ABNORMAL HIGH (ref 12–46)
MCH: 33.1 pg (ref 26.0–34.0)
MCHC: 33.6 g/dL (ref 30.0–36.0)
MCV: 98.7 fL (ref 78.0–100.0)
MONOS PCT: 9 % (ref 3–12)
Monocytes Absolute: 0.6 10*3/uL (ref 0.1–1.0)
Neutro Abs: 2.4 10*3/uL (ref 1.7–7.7)
Neutrophils Relative %: 37 % — ABNORMAL LOW (ref 43–77)
Platelets: 326 10*3/uL (ref 150–400)
RBC: 3.05 MIL/uL — AB (ref 3.87–5.11)
RDW: 16.6 % — ABNORMAL HIGH (ref 11.5–15.5)
WBC: 6.6 10*3/uL (ref 4.0–10.5)

## 2014-02-02 MED ORDER — DIPHENHYDRAMINE HCL 25 MG PO CAPS: 25.0000 mg | ORAL_CAPSULE | Freq: Four times a day (QID) | ORAL | Status: DC | PRN

## 2014-02-02 MED ORDER — SODIUM CHLORIDE 0.9 % IV SOLN
INTRAVENOUS | Status: AC
  Administered 2014-02-02: 22:00:00 via INTRAVENOUS_CENTRAL
  Filled 2014-02-02 (×3): qty 200

## 2014-02-02 MED ORDER — SODIUM CHLORIDE 0.9 % IV SOLN
4.0000 g | Freq: Once | INTRAVENOUS | Status: AC
  Administered 2014-02-02: 4 g via INTRAVENOUS
  Filled 2014-02-02: qty 40

## 2014-02-02 MED ORDER — ACETAMINOPHEN 325 MG PO TABS: 650.0000 mg | ORAL_TABLET | ORAL | Status: DC | PRN

## 2014-02-02 MED ORDER — POLYETHYLENE GLYCOL 3350 17 G PO PACK
17.0000 g | PACK | Freq: Every day | ORAL | Status: DC
  Administered 2014-02-02 – 2014-02-19 (×14): 17 g via ORAL
  Filled 2014-02-02 (×20): qty 1

## 2014-02-02 MED ORDER — TRAZODONE HCL 50 MG PO TABS
50.0000 mg | ORAL_TABLET | Freq: Every day | ORAL | Status: DC
  Administered 2014-02-02 – 2014-02-20 (×19): 50 mg via ORAL
  Filled 2014-02-02 (×19): qty 1

## 2014-02-02 MED ORDER — ACD FORMULA A 0.73-2.45-2.2 GM/100ML VI SOLN
Status: AC
  Administered 2014-02-02: 500 mL
  Filled 2014-02-02: qty 500

## 2014-02-02 MED ORDER — ACD FORMULA A 0.73-2.45-2.2 GM/100ML VI SOLN
500.0000 mL | Status: DC
  Filled 2014-02-02 (×2): qty 500

## 2014-02-02 MED ORDER — FLEET ENEMA 7-19 GM/118ML RE ENEM
1.0000 | ENEMA | Freq: Every day | RECTAL | Status: DC | PRN
  Administered 2014-02-13: 1 via RECTAL
  Filled 2014-02-02: qty 1

## 2014-02-02 MED ORDER — CALCIUM CARBONATE ANTACID 500 MG PO CHEW
2.0000 | CHEWABLE_TABLET | ORAL | Status: AC
  Filled 2014-02-02 (×2): qty 2

## 2014-02-02 MED ORDER — HEPARIN SODIUM (PORCINE) 1000 UNIT/ML IJ SOLN
1000.0000 [IU] | Freq: Once | INTRAMUSCULAR | Status: DC
  Filled 2014-02-02: qty 1

## 2014-02-02 NOTE — Progress Notes (Signed)
Pt was receiving plasmapharesis, and RN said that she would call when she is back on the floor.

## 2014-02-02 NOTE — Progress Notes (Signed)
Crystal Oyster, MD Physician Signed Physical Medicine and Rehabilitation Consult Note Service date: 01/23/2014 8:05 AM  Related encounter: Admission (Discharged) from 01/16/2014 in [facility] NEUROSCIENCE           Physical Medicine and Rehabilitation Consult   Reason for Consult: Quadriparesis. Referring Physician:  Dr Sharon Seller     HPI: Crystal Stein is a 43 y.o. female with history of depression with anxiety, recent NSTEMI, who was readmitted on 01/16/14 with recurrent chest pain as well as confusion. CTA chest negative for PE and new hepatomegaly and hepatic steatosis noted. UDS positive for marijuana and benzos.  Cardiology consulted and felt that symptoms due to psychosocial issues. Patient reported difficulty walking and well as BLE weakness on 07/29 and MRI lumbar spine without disc herniation or stenosis. 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 She was started on IVIG for treatment and has had complaints of worsening of numbness.  Confusion resolving. Kleb UTI treated with cipro and foley placed due to urinary retention. PT initiated and patient limited by significant weakness. CIR recommended by MD and rehab team.      Review of Systems  HENT: Negative for hearing loss.   Eyes: Negative for blurred vision and double vision.  Respiratory: Negative for cough, shortness of breath and wheezing.   Cardiovascular: Positive for chest pain (from upper chest down to abdomen.).  Gastrointestinal: Negative for heartburn, nausea and abdominal pain.  Musculoskeletal: Negative for back pain and myalgias.  Neurological: Positive for tingling, sensory change, focal weakness and weakness. Negative for headaches.   Psychiatric/Behavioral: The patient is nervous/anxious and has insomnia.      Past Medical History   Diagnosis  Date   .  Anxiety     .  Depression     .  Asthma     .  Edentulism, partial  10/07       Full upper extraction    .  COPD (chronic obstructive pulmonary disease)     .  GERD (gastroesophageal reflux disease)     .  Bipolar disorder     .  Blood transfusion  2006   .  History of cardiac catheterization  July 2015       Angiographically normal coronary arteries with distal tortuosity   .  NSTEMI (non-ST elevated myocardial infarction)         Not secondary to obstructive CAD or obvious plaque rupture, possibly vasospasm       Past Surgical History   Procedure  Laterality  Date   .  Tennis elbow release    04/27/06       Left; [facility], [facility]   .  Multiple tooth extractions    10/07       Full upper   .  Orif proximal humerus fracture    08/06       Left,   .  Dilation and curettage of uterus    June 2012       Failed ablation   .  Tubal ligation    June 2012       [place]   .  Bipolar disorder       .  Hysteroscopy    01/27/2011  Procedure: HYSTEROSCOPY;  Surgeon: Tilda Burrow, MD;  Location: [facility];  Service: Gynecology;  Laterality: N/A;   .  Vaginal hysterectomy    05/19/2011       Procedure: HYSTERECTOMY VAGINAL;  Surgeon: Tilda Burrow, MD;  Location: [facility];  Service: Gynecology;  Laterality: N/A;   .  Orif wrist fracture    06/17/2012       Procedure: OPEN REDUCTION INTERNAL FIXATION (ORIF) WRIST FRACTURE;  Surgeon: Vickki Hearing, MD;  Location: [facility];  Service: Orthopedics;  Laterality: Left;   .  Carpal tunnel release    06/17/2012       Procedure: CARPAL TUNNEL RELEASE;  Surgeon: Vickki Hearing, MD;  Location: [facility];  Service: Orthopedics;  Laterality: Left;       Family History   Problem  Relation  Age of Onset   .  Anesthesia problems  Neg Hx     .  Hypotension  Neg Hx     .  Malignant hyperthermia  Neg Hx     .  Pseudochol  deficiency  Neg Hx     .  Diabetes          Social History:   Separated from husband and returned to [place] few weeks ago for court date/custody issues. Living with parents and brother. She reports that she has been smoking Cigarettes < 1PPD.  She has a 22 pack-year smoking history. She does not have any smokeless tobacco history on file. She reports that she does not drink alcohol. Uses marijuana occasionally.      Allergies: No Known Allergies      Medications Prior to Admission   Medication  Sig  Dispense  Refill   .  acetaminophen (TYLENOL) 500 MG tablet  Take 500 mg by mouth every 6 (six) hours as needed for mild pain or moderate pain.         .  isosorbide mononitrate (IMDUR) 30 MG 24 hr tablet  Take 1 tablet (30 mg total) by mouth daily.   30 tablet   5   .  metoprolol tartrate (LOPRESSOR) 25 MG tablet  Take 1 tablet (25 mg total) by mouth 2 (two) times daily.   60 tablet   5   .  nitroGLYCERIN (NITROSTAT) 0.4 MG SL tablet  Place 1 tablet (0.4 mg total) under the tongue every 5 (five) minutes x 3 doses as needed for chest pain.   25 tablet   12   .  omeprazole (PRILOSEC) 20 MG capsule  Take 20 mg by mouth daily.              Home: Home Living Family/patient expects to be discharged to:: Unsure Living Arrangements: Parent Additional Comments: Per patient, she lived with her parents, however she states (and noted in chart) that they are currently unable to care for her in her current state.    Functional History: Prior Function Level of Independence: Needs assistance;Independent Gait / Transfers Assistance Needed: Prior to all her progressing health issues, pt was (I) with bed moibility skills, transfers, and ambulation skills.  Per chart, pt has been unable to transfer or amb for the last week stating "i can't" Functional Status:   Mobility: Bed Mobility Overal bed mobility: Needs Assistance Bed Mobility: Supine to Sit;Sit to Supine Supine to sit: Mod assist Sit to supine:  Mod assist General bed mobility comments: Assist to bring legs off of bed and to raise trunk into sitting. Assist to bring  legs back onto bed and control descent of trunk. Transfers Overall transfer level: Needs assistance Equipment used: Rolling walker (2 wheeled) Transfers: Sit to/from Stand Sit to Stand: From elevated surface;Total assist General transfer comment: Attempted x 2 to stand with Stedy and 2 person assist but pt unable due to weakness. Ambulation/Gait Gait velocity: Unable to attempt at this time.    ADL:   Cognition: Cognition Overall Cognitive Status: Within Functional Limits for tasks assessed Orientation Level: Oriented to person;Oriented to place;Oriented to situation Cognition Arousal/Alertness: Awake/alert Behavior During Therapy: Anxious Overall Cognitive Status: Within Functional Limits for tasks assessed   Blood pressure 139/102, pulse 95, temperature 98 F (36.7 C), temperature source Oral, resp. rate 19, height 5' 4.5" (1.638 m), weight 52 kg (114 lb 10.2 oz), last menstrual period 04/23/2011, SpO2 98.00%. Physical Exam  Nursing note and vitals reviewed. Constitutional: She is oriented to person, place, and time. She appears well-developed.  HENT:   Head: Normocephalic and atraumatic.  Edentulous.   Eyes: Conjunctivae are normal. Pupils are equal, round, and reactive to light.  Neck: Normal range of motion. Neck supple.  Cardiovascular: Normal rate and regular rhythm.   Respiratory: Effort normal and breath sounds normal. No respiratory distress. She has no wheezes.  GI: Soft. Bowel sounds are normal. She exhibits no distension. There is no tenderness.  Musculoskeletal: She exhibits no edema and no tenderness.  Neurological: She is alert and oriented to person, place, and time.  Speech clear. Hypersensitivity chest wall and dysesthesias RLE>LLE. UES: 3/5 deltoid, bicep, tricep, 3- HI, LE's 1/5 HF, KE and ankles. Decreased sensation in both hand and  LE's. DTR's absent. Cognition appropriate   Skin: Skin is warm and dry.  Psychiatric: Her speech is normal and behavior is normal. Thought content normal. Her mood appears anxious. Her affect is blunt. Cognition and memory are normal.     Results for orders placed during the hospital encounter of 01/16/14 (from the past 24 hour(s))   BASIC METABOLIC PANEL     Status: Abnormal     Collection Time      01/23/14  3:08 AM       Result  Value  Ref Range     Sodium  132 (*)  137 - 147 mEq/L     Potassium  4.2   3.7 - 5.3 mEq/L     Chloride  98   96 - 112 mEq/L     CO2  24   19 - 32 mEq/L     Glucose, Bld  79   70 - 99 mg/dL     BUN  8   6 - 23 mg/dL     Creatinine, Ser  1.61 (*)  0.50 - 1.10 mg/dL     Calcium  8.4   8.4 - 10.5 mg/dL     GFR calc non Af Amer  >90   >90 mL/min     GFR calc Af Amer  >90   >90 mL/min     Anion gap  10   5 - 15   CBC     Status: Abnormal     Collection Time      01/23/14  3:08 AM       Result  Value  Ref Range     WBC  5.0   4.0 - 10.5 K/uL     RBC  2.98 (*)  3.87 - 5.11 MIL/uL     Hemoglobin  9.5 (*)  12.0 - 15.0 g/dL  HCT  28.1 (*)  36.0 - 46.0 %     MCV  94.3   78.0 - 100.0 fL     MCH  31.9   26.0 - 34.0 pg     MCHC  33.8   30.0 - 36.0 g/dL     RDW  16.1 (*)  09.6 - 15.5 %     Platelets  290   150 - 400 K/uL    Mr Cervical Spine W Wo Contrast   01/21/2014   CLINICAL DATA:  Progressive numbness and weakness beginning in the legs and now involving the upper extremities.  EXAM: MRI CERVICAL SPINE WITHOUT AND WITH CONTRAST  TECHNIQUE: Multiplanar and multiecho pulse sequences of the cervical spine, to include the craniocervical junction and cervicothoracic junction, were obtained according to standard protocol without and with intravenous contrast.  CONTRAST:  10mL MULTIHANCE GADOBENATE DIMEGLUMINE 529 MG/ML IV SOLN COMPARISON:  MRI lumbar spine 01/18/2014.  FINDINGS: Normal signal is present in the cervical and upper thoracic spinal cord to the lowest  imaged level, T3-4. Marrow signal, vertebral body heights, and alignment are normal. The postcontrast images demonstrate no pathologic enhancement.  The craniocervical junction is within normal limits. Flow is present in the vertebral arteries.  C2-3:  Negative.  C3-4:  Negative.  C4-5: Mild uncovertebral spurring is worse on the right. There is no significant associated stenosis.  C5-6: A broad-based disc osteophyte complex is asymmetric to the left. Uncovertebral spurring is noted bilaterally. Mild left foraminal narrowing is present.  C6-7: A shallow central disc protrusion partially effaces the ventral CSF without significant stenosis.  C7-T1:  Negative.  IMPRESSION: 1. Normal MRI appearance of the cervical and upper thoracic spinal cord without pathologic enhancement. 2. Mild left foraminal narrowing at C5-6 due to a leftward disc osteophyte complex and uncovertebral spurring. 3. Mild right-sided uncovertebral spurring at C4-5 without significant stenosis. 4. Shallow central disc protrusion at C6-7 without significant stenosis.   Electronically Signed   By: Gennette Pac M.D.   On: 01/21/2014 13:31     Assessment/Plan: Diagnosis: GBS Does the need for close, 24 hr/day medical supervision in concert with the patient's rehab needs make it unreasonable for this patient to be served in a less intensive setting? Yes Co-Morbidities requiring supervision/potential complications: substance abuse, UTI Due to bladder management, bowel management, safety, skin/wound care, disease management, medication administration, pain management and patient education, does the patient require 24 hr/day rehab nursing? Yes Does the patient require coordinated care of a physician, rehab nurse, PT (1-2 hrs/day, 5 days/week) and OT (1-2 hrs/day, 5 days/week) to address physical and functional deficits in the context of the above medical diagnosis(es)? Yes Addressing deficits in the following areas: balance, endurance,  locomotion, strength, transferring, bowel/bladder control, bathing, dressing, feeding, grooming, toileting and psychosocial support Can the patient actively participate in an intensive therapy program of at least 3 hrs of therapy per day at least 5 days per week? Yes The potential for patient to make measurable gains while on inpatient rehab is excellent Anticipated functional outcomes upon discharge from inpatient rehab are supervision with PT, supervision with OT, n/a with SLP. Estimated rehab length of stay to reach the above functional goals is: 16-22 days Does the patient have adequate social supports to accommodate these discharge functional goals? Yes Anticipated D/C setting: Home Anticipated post D/C treatments: HH therapy Overall Rehab/Functional Prognosis: good   RECOMMENDATIONS: This patient's condition is appropriate for continued rehabilitative care in the following setting: CIR Patient has agreed  to participate in recommended program. Yes Note that insurance prior authorization may be required for reimbursement for recommended care.   Comment: Rehab Admissions Coordinator to follow up.   Thanks,   Crystal OysterZachary T. Swartz, MD, [place] DomFAAPMR         01/23/2014    Revision History...     Date/Time User Action   01/23/2014 3:25 PM Crystal OysterZachary T Swartz, MD Sign   01/23/2014 9:19 AM Jacquelynn CreePamela S Love, PA-C Share  View Details Report   Routing History...     Date/Time From To Method   01/23/2014 3:25 PM Crystal OysterZachary T Swartz, MD Crystal OysterZachary T Swartz, MD In Basket   01/23/2014 3:25 PM Crystal OysterZachary T Swartz, MD No Pcp Per Patient In Basket

## 2014-02-02 NOTE — Plan of Care (Signed)
Problem: RH BOWEL ELIMINATION Goal: RH STG MANAGE BOWEL WITH ASSISTANCE STG Manage Bowel with Assistance. Max A  Outcome: Not Progressing Last bm 01/28/14 ,pt given supp.

## 2014-02-02 NOTE — Discharge Instructions (Signed)
Inpatient Rehab Discharge Instructions  Crystal DikeJennifer P Stein Discharge date and time:    Activities/Precautions/ Functional Status: Activity: activity as tolerated Diet: regular diet Wound Care: none needed Functional status:  ___ No restrictions     ___ Walk up steps independently ___ 24/7 supervision/assistance   ___ Walk up steps with assistance ___ Intermittent supervision/assistance  ___ Bathe/dress independently ___ Walk with walker     ___ Bathe/dress with assistance ___ Walk Independently    ___ Shower independently ___ Walk with assistance    ___ Shower with assistance ___ No alcohol     ___ Return to work/school ________  Special Instructions:    My questions have been answered and I understand these instructions. I will adhere to these goals and the provided educational materials after my discharge from the hospital.  Patient/Caregiver Signature _______________________________ Date __________  Clinician Signature _______________________________________ Date __________  Please bring this form and your medication list with you to all your follow-up doctor's appointments.

## 2014-02-02 NOTE — IPOC Note (Addendum)
Overall Plan of Care [facility]) Patient Details Name: Crystal Stein MRN: [id number] DOB: 04-26-1971  Admitting Diagnosis: gbs  Hospital Problems: Active Problems:   GBS (Guillain-Barre syndrome)     Functional Problem List: Nursing Bladder;Bowel;Pain;Skin Integrity  PT Balance;Behavior;Endurance;Motor;Pain;Safety;Sensory;Skin Integrity  OT Balance;Endurance;Motor;Edema;Pain;Perception;Safety;Sensory;Skin Integrity  SLP    TR Activity tolerance, functional mobility, balance, safety, pain, anxiety/stress        Basic ADL's: OT Eating;Bathing;Dressing;Toileting;Grooming     Advanced  ADL's: OT       Transfers: PT Bed Mobility;Bed to Chair;Furniture;Car  OT Toilet;Tub/Shower     Locomotion: Advertising account planner;Ambulation;Stairs     Additional Impairments: OT Fuctional Use of Upper Extremity  SLP        TR      Anticipated Outcomes Item Anticipated Outcome  Self Feeding supervision  Swallowing      Basic self-care  min a   Toileting  mod a    Bathroom Transfers min a   Bowel/Bladder  Min A  Transfers  supervision  Locomotion  supervision w/c x 150; gait and stairs goals TBD  Communication     Cognition     Pain  <4  Safety/Judgment  Supervision   Therapy Plan: PT Intensity: Minimum of 1-2 x/day ,45 to 90 minutes PT Frequency: 5 out of 7 days PT Duration Estimated Length of Stay: 28 days TR Duration/ELOS:  4 weeks TR Frequency:  Min 1 time per week >20 minutes OT Plan  OT Intensity: Minimum of 1-2 x/day, 45 to 90 minutes  OT Frequency: 5 out of 7 days  OT Duration/Estimated Length of Stay: 4weeks         Team Interventions: Nursing Interventions Bladder Management;Bowel Management;Disease Management/Prevention;Pain Management  PT interventions Ambulation/gait training;Balance/vestibular training;Cognitive remediation/compensation;Discharge planning;Community reintegration;DME/adaptive equipment instruction;Functional electrical  stimulation;Functional mobility training;Patient/family education;Pain management;Neuromuscular re-education;Psychosocial support;Splinting/orthotics;Therapeutic Exercise;Therapeutic Activities;Stair training;UE/LE Strength taining/ROM;UE/LE Coordination activities;Wheelchair propulsion/positioning  OT Interventions Balance/vestibular training;Community reintegration;Discharge planning;Disease mangement/prevention;DME/adaptive equipment instruction;Functional electrical stimulation;Functional mobility training;Pain management;Neuromuscular re-education;Self Care/advanced ADL retraining;Psychosocial support;Patient/family education;Skin care/wound managment;Splinting/orthotics;Therapeutic Activities;Therapeutic Exercise;UE/LE Coordination activities;UE/LE Strength taining/ROM;Wheelchair propulsion/positioning  SLP Interventions    TR Interventions recreation/leisure participation, Balance/Vestibular training, functional mobility, therapeutic activities, UE/LE strength/coordination, w/c mobility, community reintegration, pt/family education, adaptive equipment instruction/use, discharge planning, psychosocial support, anxiety/stress management  SW/CM Interventions Discharge Planning;Psychosocial Support;Patient/Family Education    Team Discharge Planning: Destination: PT- (unsure at this time) ,[place] (SNF) , SLP-  Projected Follow-up: PT-Skilled nursing facility, OT-  Skilled nursing facility, SLP-  Projected Equipment Needs: PT-To be determined, OT- 3 in 1 bedside comode;Tub/shower seat;Wheelchair cushion (measurements);Wheelchair (measurements), SLP-  Equipment Details: PT- , OT- to be determined Patient/family involved in discharge planning: PT- Patient,  OT-Patient, SLP-   MD ELOS: 22-28days          Medical Rehab Prognosis:  Fair Assessment: 43 y.o. female with history of depression with anxiety, recent NSTEMI, who was readmitted on 01/16/14 with recurrent chest pain as  well as confusion. CTA chest negative for PE and new hepatomegaly and hepatic steatosis noted. UDS positive for marijuana and benzos. Cardiology consulted and felt that symptoms due to psychosocial issues. Patient reported difficulty walking and well as BLE weakness on 07/29 and MRI lumbar spine without disc herniation or stenosis. 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 She was treated  with a round of IVIG without much improvement. Patient with stable NIF/VC but with elevated ESR.  HD cath was placed by IVR and she was started on PLEX due to minimal response with IVIG. Confusion has resolved. Kleb UTI treated with cipro and foley placed due to urinary retention. Therapy ongoing and patient continues to have high levels of anxiety as well as fear with mobility  Now requiring 24/7 Rehab RN,MD, as well as CIR level PT, OT .  Treatment team will focus on ADLs and mobility with goals set at [facility]    See Team Conference Notes for weekly updates to the plan of care

## 2014-02-02 NOTE — Plan of Care (Signed)
Problem: RH BOWEL ELIMINATION Goal: RH STG MANAGE BOWEL W/MEDICATION W/ASSISTANCE STG Manage Bowel with Medication with Assistance.  Outcome: Not Progressing Last BM 01/28/14 pt given supp.

## 2014-02-02 NOTE — Evaluation (Addendum)
Occupational Therapy Assessment and Plan  Patient Details  Name: Crystal Stein MRN: [id number] Date of Birth: November 26, 1970  OT Diagnosis: abnormal posture, acute pain, muscle weakness (generalized) and quadriparesis  Rehab Potential: Rehab Potential: Good ELOS: 4weeks   Today's Date: 02/02/2014 Time: 1100-1200  Time Calculation (min): 61 min  Problem List:  Patient Active Problem List   Diagnosis Date Noted  . 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 Guillain Barr syndrome 01/21/2014  . Quadriplegia and quadriparesis 01/21/2014  . Bilateral leg weakness 01/20/2014  . Hyperkalemia 01/19/2014  . UTI (urinary tract infection) 01/19/2014  . Hypokalemia 01/18/2014  . 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 Acute encephalopathy 01/17/2014  . Substance abuse 01/17/2014  . Sinus tachycardia 01/17/2014  . Elevated transaminase level 01/17/2014  . Metabolic acidosis 56/43/3295  . Chest pressure 01/16/2014  . Chest pain 01/16/2014  . Tobacco abuse 01/12/2014  . Dyslipidemia 01/12/2014  . NSTEMI (non-ST elevated myocardial infarction) 01/10/2014  . Left ankle sprain 11/28/2012  . Carpal tunnel syndrome of left wrist 11/15/2012  . Left ankle instability 11/15/2012  . Muscle weakness (generalized) 08/19/2012  . Wrist fracture 08/15/2012  . Wrist pain, left 07/01/2012  . Edema of hand 07/01/2012  . Radius distal fracture 06/16/2012  . Ankle sprain 11/04/2011  . OTHER ABNORMAL BLOOD CHEMISTRY 01/22/2009  . LIVER FUNCTION TESTS, ABNORMAL, HX OF 01/16/2009  . ANEMIA 09/13/2008  . DIARRHEA NOS 09/13/2008  . ABDOMINAL PAIN, GENERALIZED 09/13/2008  . ABDOMINAL PAIN, LOWER 09/13/2008  . ABNORMAL TRANSAMINASE, (LFT'S) 09/13/2008  . RUQ PAIN 08/17/2008  . ACUTE SINUSITIS, UNSPECIFIED 07/23/2008  . ACUTE BRONCHITIS 07/23/2008  . LATERAL EPICONDYLITIS 04/25/2008  . CUBITAL TUNNEL SYNDROME 02/07/2008  . Pain in Joint, Upper Arm 02/07/2008  . CLOSED FRACTURE OF SURGICAL NECK OF  HUMERUS 02/07/2008  . BIPOLAR DISORDER UNSPECIFIED 12/02/2007  . ANXIETY, CHRONIC 12/02/2007  . NICOTINE ADDICTION 12/02/2007  . NECK PAIN, CHRONIC 12/02/2007    Past Medical History:  Past Medical History  Diagnosis Date  . Anxiety   . Depression   . Asthma   . 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 COPD (chronic obstructive pulmonary disease)   . GERD (gastroesophageal reflux disease)   . Bipolar disorder   . Blood transfusion 2006  . History of cardiac catheterization July 2015    Angiographically normal coronary arteries with distal tortuosity  . NSTEMI (non-ST elevated myocardial infarction)     Not secondary to obstructive CAD or obvious plaque rupture, possibly vasospasm   Past Surgical History:  Past Surgical History  Procedure Laterality Date  . Tennis elbow release  04/27/06    Left; [facility], [facility]  . Multiple tooth extractions  10/07    Full upper  . Orif proximal humerus fracture  08/06    Left,  . Dilation and curettage of uterus  June 2012    Failed ablation  . Tubal ligation  June 2012    [facility]  . Bipolar disorder    . Hysteroscopy  01/27/2011    Procedure: HYSTEROSCOPY;  Surgeon: Jonnie Kind, MD;  Location: [facility];  Service: Gynecology;  Laterality: N/A;  . Vaginal hysterectomy  05/19/2011    Procedure: HYSTERECTOMY VAGINAL;  Surgeon: Jonnie Kind, MD;  Location: [facility];  Service: Gynecology;  Laterality: N/A;  . Orif wrist fracture  06/17/2012    Procedure: OPEN REDUCTION INTERNAL FIXATION (ORIF) WRIST FRACTURE;  Surgeon: Carole Civil, MD;  Location: [facility];  Service: Orthopedics;  Laterality: Left;  .  Carpal tunnel release  06/17/2012    Procedure: CARPAL TUNNEL RELEASE;  Surgeon: Carole Civil, MD;  Location: [facility];  Service: Orthopedics;  Laterality: Left;    Assessment & Plan Clinical Impression: Patient is a 43 y.o. year old female with history of depression with anxiety, recent NSTEMI, who was readmitted on  01/16/14 with recurrent chest pain as well as confusion. CTA chest negative for PE and new hepatomegaly and hepatic steatosis noted. UDS positive for marijuana and benzos. Cardiology consulted and felt that symptoms due to psychosocial issues. Patient reported difficulty walking and well as BLE weakness on 07/29 and MRI lumbar spine without disc herniation or stenosis. 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 She was treated with a round of IVIG without much improvement. Patient with stable NIF/VC but with elevated ESR. HD cath was placed by IVR and she was started on PLEX due to minimal response with IVIG. Confusion has resolved. Kleb UTI treated with cipro and foley placed due to urinary retention. Therapy ongoing and patient continues to have high levels of anxiety as well as fear with mobility. Noted to have BLE ataxia with instability as well as neuropathy with BUE affecting mobility. On 01/31/2014 patient had an episode of chest pain. EKG showed sinus tachycardia. Troponin was normal MD, therapy team recommending CIR to lessen burden of care and patient admitted today. Patient transferred to CIR on 02/01/2014 .    Patient currently requires total with basic self-care skills and basic mobility secondary to muscle weakness and decr sensation, decreased cardiorespiratoy endurance, impaired timing and sequencing, unbalanced muscle activation, decreased coordination, decreased motor planning and decr sensation, pain andin LEs and decreased sitting balance, decreased standing balance, decreased postural control, decreased balance strategies and difficulty maintaining precautions.  Prior to hospitalization, patient could complete adls with independent .  Patient will benefit from skilled  intervention to decrease level of assist with basic self-care skills and increase independence with basic self-care skills prior to discharge home with care partner.  Anticipate patient will require minimal physical assistance and f/u.  OT - End of Session Activity Tolerance: Tolerates 10 - 20 min activity with multiple rests Endurance Deficit: Yes OT Assessment Rehab Potential: Good OT Patient demonstrates impairments in the following area(s): Balance;Endurance;Motor;Edema;Pain;Perception;Safety;Sensory;Skin Integrity OT Basic ADL's Functional Problem(s): Eating;Bathing;Dressing;Toileting;Grooming OT Transfers Functional Problem(s): Toilet;Tub/Shower OT Additional Impairment(s): Fuctional Use of Upper Extremity OT Plan OT Intensity: Minimum of 1-2 x/day, 45 to 90 minutes OT Frequency: 5 out of 7 days OT Duration/Estimated Length of Stay: 4weeks OT Treatment/Interventions: Balance/vestibular training;Community reintegration;Discharge planning;Disease mangement/prevention;DME/adaptive equipment instruction;Functional electrical stimulation;Functional mobility training;Pain management;Neuromuscular re-education;Self Care/advanced ADL retraining;Psychosocial support;Patient/family education;Skin care/wound managment;Splinting/orthotics;Therapeutic Activities;Therapeutic Exercise;UE/LE Coordination activities;UE/LE Strength taining/ROM;Wheelchair propulsion/positioning OT Self Feeding Anticipated Outcome(s): supervision OT Basic Self-Care Anticipated Outcome(s): min a  OT Toileting Anticipated Outcome(s): mod a  OT Bathroom Transfers Anticipated Outcome(s): min a  OT Recommendation Recommendations for Other Services: Neuropsych consult Patient destination: [facility] (SNF) Follow Up Recommendations: Skilled nursing facility Equipment Recommended: 3 in 1 bedside comode;Tub/shower seat;Wheelchair cushion (measurements);Wheelchair (measurements)   Skilled Therapeutic  Intervention  Ot eval initited with ot goals, purpose, role discussed.  Self care retraining at w./c and bed level.  Pt in chair when arrived. Focused on ub bathing and dressing in w/c with using  hands in modified ways to bathe ub and don shirt.  With requests rest breaks often due to fatigue.  Pt with more active movement and control in left le compared to right with positioning.  Issued hand mitt for bathing to attempt tomorrow to increase independence.  For LB bathing and dressing transitioned into the bed via slide board.  Donned patient's shoes for more support and maintain contact with the floor. Had pt's feet on step to also ensure  Contact with floor surface.  Pt with decreased fear with transfer using slide board; required max to total a.  Pt able to  Maintain forward weight shift holding onto therapist .  Pt with difficulty getting into a modified sitting position to assist with lower  body due to tightness in hamstrings and quad.  Pt left in bed at end of session with soft call bell.  Pt with increased pain in right calf in transfer and with foot dangling down while sitting in w/c (unsupported foot)  Later returned to room to adjust call bell and don u cuff in right hand with pointer for increaed independence with turning the channels.    OT Evaluation Precautions/Restrictions  Precautions Precautions: Fall Precaution Comments: bilateral ue/le, fear of falling  Restrictions Weight Bearing Restrictions: No General Chart Reviewed: Yes Family/Caregiver Present: No Vital Signs Therapy Vitals Temp: 97.7 F (36.5 C) Temp src: Axillary Pulse Rate: 98 Resp: 12 BP: 100/72 mmHg Oxygen Therapy SpO2: 96 % O2 Device: None (Room air) Pain Pain Assessment Pain Assessment: 0-10 Pain Score: 0-No pain Except with transfer some tightness in left le Home Living/Prior Functioning Home Living Type of Home: House Home Access: Stairs to enter Technical brewer of Steps: 5 Entrance  Stairs-Rails: Right;Left Home Layout: One level Additional Comments: Pt currently staying at parents house due to custody issues with children. pt plans to return to [place] residence at some point.   Lives With: Family Prior Function Level of Independence: Independent with gait;Independent with transfers  Able to Take Stairs?: Yes Driving: Yes Vocation: Unemployed Leisure: Hobbies-yes (Comment) Comments: reading, going to the pool, walking for exercise ADL ADL ADL Comments: see fim Vision/Perception  Vision- History Baseline Vision/History: Wears glasses Wears Glasses: Reading only Patient Visual Report: No change from baseline Vision- Assessment Vision Assessment?: No apparent visual deficits  Cognition Overall Cognitive Status: Within Functional Limits for tasks assessed Arousal/Alertness: Awake/alert Orientation Level: Oriented X4 Safety/Judgment: Appears intact Sensation  dorsal and ventral part of bilateral hands are with decr light touch, decr to absent light touch on ventral side of forearm, able to sense light touch on dorsal side of bilateral ue Motor  Motor Motor: Tetraplegia Motor - Skilled Clinical Observations: generalized weakness Mobility  Bed Mobility Bed Mobility: Rolling Right;Rolling Left;Right Sidelying to Sit Rolling Right: 3: Mod assist Rolling Left: 3: Mod assist  Trunk/Postural Assessment  Cervical Assessment Cervical Assessment: Within Functional Limits Thoracic Assessment Thoracic Assessment: Exceptions to [facility] (kyphoic) Lumbar Assessment Lumbar Assessment: Exceptions to [facility] (limited by pain) Postural Control Postural Control: Deficits on evaluation Protective Responses: delayed  Balance Balance Balance Assessed: Yes Static Sitting Balance Static Sitting - Level of Assistance: 5: Stand by assistance;4: Min assist Dynamic Sitting Balance Sitting balance - Comments: able to sit EOB with bilateral ue support required min a to shift  weight in prep for transfer and for adl task Extremity/Trunk Assessment RUE Assessment RUE Assessment: Exceptions to [facility] RUE AROM (degrees) Overall AROM Right Upper Extremity: Other (comment) (limited to fatigue ) Right Shoulder  Extension: 110 Degrees Right Wrist Extension: 0 Degrees Right Wrist Flexion:  (remains in flexion ) RUE Strength RUE Overall Strength: Deficits RUE Overall Strength Comments: proximal 3/5  distally 2-/5 LUE Assessment LUE Assessment: Exceptions to WFL LUE AROM (degrees) Overall AROM Left Upper Extremity: Deficits LUE Overall AROM Comments: limited by fatigue Left Shoulder Extension: 110 Degrees Left Wrist Extension: 15 Degrees Left Wrist Flexion:  (remains in flexion) Left Thumb Opposition: Digit 4;Digit 5;Digit 3 LUE Strength LUE Overall Strength: Deficits LUE Overall Strength Comments: proximally 3/5   distally 2-5  FIM:  FIM - Grooming Grooming: 1: Patient completes 0 of 4 or 1 of 5 steps, or requires 2 helpers FIM - Bathing Bathing: 1: Total-Patient completes 0-2 of 10 parts or less than 25% FIM - Upper Body Dressing/Undressing Upper body dressing/undressing steps patient completed: Thread/unthread right sleeve of pullover shirt/dresss;Thread/unthread left sleeve of pullover shirt/dress Upper body dressing/undressing: 3: Mod-Patient completed 50-74% of tasks FIM - Lower Body Dressing/Undressing Lower body dressing/undressing: 1: Two helpers FIM - IT sales professional Transfer: 1: Chair or W/C > Bed: Total A (helper does all/Pt. < 25%);1: Bed > Chair or W/C: Total A (helper does all/Pt. < 25%)   Refer to Care Plan for Long Term Goals  Recommendations for other services: Neuropsych  Discharge Criteria: Patient will be discharged from OT if patient refuses treatment 3 consecutive times without medical reason, if treatment goals not met, if there is a change in medical status, if patient makes no progress towards goals or if patient is  discharged from hospital.  The above assessment, treatment plan, treatment alternatives and goals were discussed and mutually agreed upon: by patient  Pattye, Meda 02/02/2014, 9:49 PM

## 2014-02-02 NOTE — Plan of Care (Signed)
Problem: RH BLADDER ELIMINATION Goal: RH STG MANAGE BLADDER WITH ASSISTANCE STG Manage Bladder With Assistance. Min A  Outcome: Not Progressing Pt has indwelling foley

## 2014-02-02 NOTE — Progress Notes (Signed)
NIF -50cmH20, and VC 2.3L. Good effort.

## 2014-02-02 NOTE — Progress Notes (Addendum)
Admission skin assessment completed with Crystal NestlePam Love, PA. Patient with bruises to extremities and lower abdomen, patient has a linear fissure in the gluteal cleft 2 cm x 0.1cm 100% pink, covered with small Allevyn. Patient has calloused skin to bilateral heels. Crystal Stein, Crystal Stein Crystal JesterMichele

## 2014-02-02 NOTE — Progress Notes (Signed)
Subjective/Complaints: 43 y.o. female with history of depression with anxiety, recent NSTEMI, who was readmitted on 01/16/14 with recurrent chest pain as well as confusion. CTA chest negative for PE and new hepatomegaly and hepatic steatosis noted. UDS positive for marijuana and benzos. Cardiology consulted and felt that symptoms due to psychosocial issues. Patient reported difficulty walking and well as BLE weakness on 07/29 and MRI lumbar spine without disc herniation or stenosis. 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 She was treated with a round of IVIG without much improvement. Patient with stable NIF/VC but with elevated ESR.  HD cath was placed by IVR and she was started on PLEX due to minimal response with IVIG. Confusion has resolved. Kleb UTI treated with cipro and foley placed due to urinary retention. Therapy ongoing and patient continues to have high levels of anxiety as well as fear with mobility.  Pt has been on xanax PTA, ?abuse Pt feels her heart is pounding because of anxiety  Objective: Vital Signs: Blood pressure 112/78, pulse 124, temperature 98.3 F (36.8 C), temperature source Oral, resp. rate 18, height '5\' 4"'  (1.626 m), weight 51 kg (112 lb 7 oz), last menstrual period 04/23/2011, SpO2 98.00%. No results found. Results for orders placed during the hospital encounter of 02/01/14 (from the past 72 hour(s))  CBC WITH DIFFERENTIAL     Status: Abnormal   Collection Time    02/02/14  4:34 AM      Result Value Ref Range   WBC 6.6  4.0 - 10.5 K/uL   RBC 3.05 (*) 3.87 - 5.11 MIL/uL   Hemoglobin 10.1 (*) 12.0 - 15.0 g/dL   HCT 30.1 (*) 36.0 - 46.0 %   MCV 98.7  78.0 - 100.0 fL   MCH 33.1  26.0 - 34.0 pg   MCHC 33.6  30.0 - 36.0 g/dL   RDW 16.6 (*) 11.5  - 15.5 %   Platelets 326  150 - 400 K/uL   Neutrophils Relative % 37 (*) 43 - 77 %   Neutro Abs 2.4  1.7 - 7.7 K/uL   Lymphocytes Relative 51 (*) 12 - 46 %   Lymphs Abs 3.4  0.7 - 4.0 K/uL   Monocytes Relative 9  3 - 12 %   Monocytes Absolute 0.6  0.1 - 1.0 K/uL   Eosinophils Relative 2  0 - 5 %   Eosinophils Absolute 0.1  0.0 - 0.7 K/uL   Basophils Relative 1  0 - 1 %   Basophils Absolute 0.1  0.0 - 0.1 K/uL  COMPREHENSIVE METABOLIC PANEL     Status: Abnormal   Collection Time    02/02/14  4:34 AM      Result Value Ref Range   Sodium 138  137 - 147 mEq/L   Potassium 3.4 (*) 3.7 - 5.3 mEq/L   Chloride 102  96 - 112 mEq/L   CO2 24  19 - 32 mEq/L   Glucose, Bld 87  70 - 99 mg/dL   BUN 10  6 - 23 mg/dL   Creatinine, Ser 0.38 (*) 0.50 - 1.10 mg/dL   Comment: DELTA CHECK NOTED   Calcium 9.0  8.4 - 10.5 mg/dL   Total Protein 5.8 (*) 6.0 - 8.3 g/dL   Albumin 3.4 (*) 3.5 -  5.2 g/dL   AST 56 (*) 0 - 37 U/L   ALT 16  0 - 35 U/L   Alkaline Phosphatase 74  39 - 117 U/L   Total Bilirubin 0.5  0.3 - 1.2 mg/dL   GFR calc non Af Amer >90  >90 mL/min   GFR calc Af Amer >90  >90 mL/min   Comment: (NOTE)     The eGFR has been calculated using the CKD EPI equation.     This calculation has not been validated in all clinical situations.     eGFR's persistently <90 mL/min signify possible Chronic Kidney     Disease.   Anion gap 12  5 - 15     HEENT: normal Cardio: tachy and no murmur Resp: CTA B/L and unlabored GI: BS positive and NT,ND Extremity:  Pulses positive and No Edema Skin:   Intact 3/5 bilateral deltoids  3 minus/5 bilateral bicep  2 minus/5 bilateral tricep  3 minus/5 left finger flexors  Trace right finger flexors  0/5 bilateral wrist extensors  2 minus/5 right hip flexors  Trace left hip flexors  Trace bilateral knee extensors  2 minus/5 bilateral hip extensors  Trace right total flexion and extension  0/5 left total flexion and extension    Assessment/Plan: 1.  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 Physiatrist is providing close team supervision and 24 hour management of active medical problems listed below. Physiatrist and rehab team continue to assess barriers to discharge/monitor patient progress toward functional and medical goals. FIM:                   Comprehension Comprehension Mode: Auditory Comprehension: 5-Follows basic conversation/direction: With no assist  Expression Expression Mode: Verbal Expression: 5-Expresses basic needs/ideas: With extra time/assistive device  Social Interaction Social Interaction: 4-Interacts appropriately 75 - 89% of the time - Needs redirection for appropriate language or to initiate interaction.  Problem Solving Problem Solving: 5-Solves basic 90% of the time/requires cueing < 10% of the time  Memory Memory: 6-More than reasonable amt of time  Medical Problem List and Plan: 1. 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  DVT Prophylaxis/Anticoagulation: Pharmaceutical: Lovenox 3. Neuropathy/Pain Management: Increase Neurontin to 300 mg qid.    4. Bipolar disorder/Mood: Team to provide ego support. Will have LCSW follow for evaluation and support. Neuro psych evaluation also. Will use klonopin rather than short acting agent 5. Neuropsych: This patient is capable of making decisions on her own behalf. 6. Skin/Wound Care: Pressure relief measures to prevent breakdown. Encourage boosting when [place]. Has very low protein stores--add nutritional supplement. 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 To continue qod  X 4 per neurology.    8. Hyponatremia: Will recheck in am. 9. Hypokalemia: Supplement and recheck in am.    10. Neurogenic bladder: Will leave foley in place till mobility improves. Recheck UCS.   11. Mild  anemia: recheck CBC in am.    12. Abnormal LFTs: Hepatitis panel negative. Will recheck in am.     LOS (Days) 1 A FACE TO FACE EVALUATION WAS [place] E 02/02/2014, 9:27 AM

## 2014-02-02 NOTE — Progress Notes (Signed)
NIF -44 VC 1.5L fair effort

## 2014-02-02 NOTE — Progress Notes (Signed)
[facility] Admission Coordinator Signed Physical Medicine and Rehabilitation PMR Pre-admission Service date: 02/01/2014 1:22 PM  Related encounter: Admission (Discharged) from 01/16/2014 in [facility]   PMR Admission Coordinator Pre-Admission Assessment (Addendum)  Patient: Crystal Stein is an 43 y.o., female  MRN: [id number]  DOB: [birth year]  Height: 5' 4.5" (163.8 cm)  Weight: 49.6 kg (109 lb 5.6 oz)  Insurance Information  Self pay - no insurance coverage  Medicaid Application Date: Case Manager: Pt/family may need help with medicaid application.  Emergency Contact Information    Contact Information     Name  Relation  Home  Work  [place]  Father  [phone]       Namon Cirri  Mother    [phone]       Current Medical History  Patient Admitting Diagnosis: GBS  History of Present Illness: A 43 y.o. female with history of depression with anxiety, recent NSTEMI, who was readmitted on 01/16/14 with recurrent chest pain as well as confusion. CTA chest negative for PE and new hepatomegaly and hepatic steatosis noted. UDS positive for marijuana and benzos. Cardiology consulted and felt that symptoms due to psychosocial issues. Patient reported difficulty walking and well as BLE weakness on 07/29 and MRI lumbar spine without disc herniation or stenosis. 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 She was started on IVIG for treatment and has had complaints of worsening of numbness. Patient with stable NIF/VC but with elevated ESR. HD cath was placed by IVR and she was started on PLEX due to minimal response with IVIG. Patient received first attempted PLEX on 01/29/14, but it had to be stopped  prior to completion. Her second PLEX treatment is scheduled for 01/31/14. Confusion resolving. Kleb UTI treated with cipro and foley placed due to urinary retention. PT initiated and patient limited by significant weakness. CIR recommended by MD and rehab team.  Note per Dr. Ree Kida on 02-01-14: Patient was supposed to be discharged yesterday, 01/31/2014 to CIR, however, she developed chest pain. Her EKG did not show any changes from prior EKGs and troponin was negative. Upon further discussion with the patient, it was likely due to anxiety. Patient is no longer having complaints of chest pain.  She also had her first plasmapheresis treatment on 01/31/2014, and she completed the session with no complaints or adverse events. Patient is stable for discharge from [facility]. Pt will be admitted to inpatient rehab today on 02-01-14.    Past Medical History    Past Medical History    Diagnosis  Date    .  Anxiety     .  Depression     .  Asthma     .  Edentulism, partial  10/07      Full upper extraction    .  COPD (chronic obstructive pulmonary disease)     .  GERD (gastroesophageal reflux disease)     .  Bipolar disorder     .  Blood transfusion  2006    .  History of cardiac catheterization  July 2015      Angiographically normal coronary arteries with distal tortuosity    .  NSTEMI (non-ST elevated myocardial infarction)  Not secondary to obstructive CAD or obvious plaque rupture, possibly vasospasm    Family History  family history includes Diabetes in an other family member. There is no history of Anesthesia problems, Hypotension, Malignant hyperthermia, or Pseudochol deficiency.  Prior Rehab/Hospitalizations: Had outpatient therapy about 5 yrs ago for "fractures".  Current Medications  Current facility-administered medications:acetaminophen (TYLENOL) tablet 500 mg, 500 mg, Oral, Q6H PRN, Doree Albee, MD, 500 mg at 01/29/14 0039; antiseptic oral rinse (CPC / CETYLPYRIDINIUM CHLORIDE  0.05%) solution 7 mL, 7 mL, Mouth Rinse, BID, Allie Bossier, MD, 7 mL at 01/31/14 1000; calcium gluconate 4 g in sodium chloride 0.9 % 250 mL IVPB, 4 g, Intravenous, Once, Roland Rack, MD  diazepam (VALIUM) tablet 5 mg, 5 mg, Oral, Q6H PRN, Kathie Dike, MD, 5 mg at 01/30/14 2152; docusate sodium (COLACE) capsule 100 mg, 100 mg, Oral, BID, Samella Parr, NP, 100 mg at 01/31/14 0370; gabapentin (NEURONTIN) capsule 300 mg, 300 mg, Oral, TID, Roland Rack, MD, 300 mg at 01/31/14 0943; heparin injection 5,000 Units, 5,000 Units, Subcutaneous, 3 times per day, Georgina Peer, RPH, 5,000 Units at 01/31/14 0531  isosorbide mononitrate (IMDUR) 24 hr tablet 60 mg, 60 mg, Oral, Daily, Cherene Altes, MD, 60 mg at 01/31/14 4888; levothyroxine (SYNTHROID, LEVOTHROID) tablet 25 mcg, 25 mcg, Oral, QAC breakfast, Allie Bossier, MD, 25 mcg at 01/31/14 [phone]; metoprolol succinate (TOPROL-XL) 24 hr tablet 100 mg, 100 mg, Oral, Daily, Cherene Altes, MD, 100 mg at 01/31/14 0943  nitroGLYCERIN (NITROSTAT) SL tablet 0.4 mg, 0.4 mg, Sublingual, Q5 Min x 3 PRN, Nimish C Gosrani, MD; ondansetron (ZOFRAN) injection 4 mg, 4 mg, Intravenous, Q6H PRN, Nimish C Gosrani, MD, 4 mg at 01/19/14 1654; ondansetron (ZOFRAN) tablet 4 mg, 4 mg, Oral, Q6H PRN, Doree Albee, MD, 4 mg at 01/27/14 0827; oxyCODONE (Oxy IR/ROXICODONE) immediate release tablet 5-10 mg, 5-10 mg, Oral, Q6H PRN, Samella Parr, NP, 10 mg at 01/31/14 0530  pantoprazole (PROTONIX) EC tablet 40 mg, 40 mg, Oral, Daily, Nimish C Gosrani, MD, 40 mg at 01/31/14 0943; polyethylene glycol (MIRALAX / GLYCOLAX) packet 17 g, 17 g, Oral, Daily, Charlynne Cousins, MD, 17 g at 01/30/14 4503  Patients Current Diet: General  Precautions / Restrictions  Precautions  Precautions: Fall  Precaution Comments: reports a fall recently; unable to state when the fall was  Restrictions  Weight Bearing Restrictions: No  Prior Activity Level  Community  (5-7x/wk): Went out daily. Did not work. Did drive.  Home Assistive Devices / Equipment  Home Assistive Devices/Equipment: None  Prior Functional Level  Prior Function  Level of Independence: Independent  Gait / Transfers Assistance Needed: independent with progressive weekness with x1 week PTA of bed level care  Current Functional Level     Cognition  Overall Cognitive Status: Impaired/Different from baseline  Orientation Level: Oriented X4     Extremity Assessment  (includes Sensation/Coordination)  Lower Extremity Assessment: Generalized weakness;RLE deficits/detail;LLE deficits/detail  RLE Deficits / Details: Limited testing seconary to complaints of severe pain in (B) legs. Pt was able to actively complete heel slides to put on socks without limitations.  LLE Deficits / Details: Limited testing secondary to complaints of severe pain in (B) legs. Pt was able to actively complete heel slides to put on socks without limitations.     ADLs  Overall ADL's : Needs assistance/impaired  Eating/Feeding: Moderate assistance;Sitting;Bed level  Eating/Feeding Details (indicate cue type and reason): Pt with wrist cock up splint on  Rt UE and universal cuff placed in palm Pt is able to use shoulder flexion to 90 degrees to scoop food against bowel and bring to mouth. pt requires shoulder elevation to scoop food; Pt reports fatigue at shoulder with efforts to self feed. Pt spilling jello but able to self feed 50% of container without (A) min guard. Pt requires universal cuff to complete any feeding. Pt demonstrates LT hand (A) durnig session to stabilize Rt  Grooming: Wash/dry face;Minimal assistance  Grooming Details (indicate cue type and reason): Pt sitting in stedy at sink level to wash face and complete oral care. Pt demonstrates good core strength  Toilet Transfer: +2 for physical assistance;Maximal assistance  Functional mobility during ADLs: +2 for physical assistance;Maximal assistance (stedy )   General ADL Comments: Pt remained bed level due to fatigue. Pt session focused on AE use and self feeding with AE. pt compensating with Lt lateral lean to help scoop Rt UE with cuff     Mobility  Overal bed mobility: Needs Assistance  Bed Mobility: Supine to Sit  Rolling: Max assist  Sidelying to sit: Mod assist  Supine to sit: Max assist;HOB elevated  Sit to supine: Mod assist  Sit to sidelying: Mod assist;+2 for physical assistance  General bed mobility comments: pt attempted to grip handrails with Rt UE and (A) with bed mobility but was limited by pain; minimally moves bil LEs but with ataxic like movements; use of draw pad to bring hips and trunk to upright sitting position     Transfers  Overall transfer level: Needs assistance  Equipment used: 2 person hand held assist  Transfers: Sit to/from [employer] Transfers  Sit to Stand: Max assist;+2 physical assistance  Stand pivot transfers: Max assist;+2 physical assistance  General transfer comment: requires bil LEs blocking to prevent buckling; cues to lean anteriorly; pt tends to push posteriorly and states "i just dont want to fall" pt very anxious of transfers due to recent falls; max encouragement throughout session and pt was able to lean anteriorly and did not resist transfer     Ambulation / Gait / Stairs / Wheelchair Mobility  Ambulation/Gait  Gait velocity: Unable to attempt at this time.  General Gait Details: unablet o ambulate; difficulty weightbearing; bil knees buckling     Posture / Balance  Dynamic Sitting Balance  Sitting balance - Comments: progressed to supervision while sitting EOB pt unable to WB through bil UEs due to pain in wrists; tolerated sitting EOB ~10 min; no c/o dizziness; able to raise shoulders out BOS minimally but required min guard with dynamic activities     Special needs/care consideration  BiPAP/CPAP No  CPM No  Continuous Drip IV No  Dialysis No Days  Life Vest No  Oxygen No  Special Bed No   Trach Size No  Wound Vac (area) No  Skin Bruising arms and groin  Bowel mgmt: Documented bowel incontinence on 01/30/14  Bladder mgmt: Foley catheter for urinary retention  Diabetic mgmt No     Previous Home Environment  Living Arrangements: Parent  Home Care Services: No  Additional Comments: Pt currently staying at parents house due to custody issues with children. pt plans to return to [place] residence at some point.  Discharge Living Setting  Plans for Discharge Living Setting: House;Lives with (comment) (Currently living with parents.)  Type of Home at Discharge: House  Discharge Home Layout: One level  Discharge Home Access: Stairs to enter  Entrance Stairs-Number of Steps: 4-5 step  entry.  Does the patient have any problems obtaining your medications?: No  Social/Family/Support Systems  Patient Roles: Spouse;Parent (Separated from husband and has 3 children.)  Contact Information: Bertram [place] - father [phone]  Anticipated Caregiver: Mom can provide supervision and get patient something to eat  Anticipated Caregiver's Contact Information: Mikle Bosworth - mom [phone]  Ability/Limitations of Caregiver: Mom not working and can provide supervision but no physical assistance. Dad works.  Caregiver Availability: 24/7  Discharge Plan Discussed with Primary Caregiver: Yes  Is Caregiver In Agreement with Plan?: Yes  Does Caregiver/Family have Issues with Lodging/Transportation while Pt is in Rehab?: No  Goals/Additional Needs  Patient/Family Goal for Rehab: PT/OT Supervision  Expected length of stay: 16-22 days  Cultural Considerations: None  Dietary Needs: Heart diet, thin liquids  Equipment Needs: TBD  Additional Information: Husband has custody of children. Has a 76 yo son, 79 yo daughter and a 24 yo daughter[place]  Pt/Family Agrees to Admission and willing to participate: Yes  Program Orientation Provided & Reviewed  with Pt/Caregiver Including Roles & Responsibilities: Yes  Decrease burden of Care through IP rehab admission: N/A  Possible need for SNF placement upon discharge: Not planned, but if no progress, may need SNF prior to home with parents. Expect patient will need SNF if she does not progress to supervision level by the time of discharge.  Patient Condition: This patient's medical and functional status has changed since the consult dated:01/23/14 in which the Rehabilitation Physician determined and documented that the patient's condition is appropriate for intensive rehabilitative care in an inpatient rehabilitation facility. See "History of Present Illness" (above) for medical update. Functional changes are: Currently requiring max assist +2 for stand pivot transfers. Unable to ambulate due to bil. Knees buckling. Patient's medical and functional status update has been discussed with the Rehabilitation physician and patient remains appropriate for inpatient rehabilitation. Will admit to inpatient rehab today.  Preadmission Screen Completed By: Retta Diones, 01/31/2014 11:32 AM  Addendum completed by Nanetta Batty, PT on 02-01-14 at 10:54  ______________________________________________________________________  Discussed status with Dr. Letta Pate on 02/01/14 at 1054 and received telephone approval for admission today.  Admission Coordinator: Nanetta Batty, PT, time1054/Date08/13/15     Cosigned by: Charlett Blake, MD [02/01/2014 1:38 PM]

## 2014-02-02 NOTE — Progress Notes (Signed)
Pt had TPE tonight. At end of Tx machine screen went black and would not allow me to get the final data. Shortly following Tx pt C/O of pain on the top of her L foot. When repositioning her I flexed her foot back and she c/o of severe pain in the back of her calf. No swelling or discoloration seen in her leg or foot. Pedal pulses are positive. Dr. Posey ReaPlotnikov called and informed pt may have a positive Homans sign in her L leg.

## 2014-02-02 NOTE — Evaluation (Signed)
Physical Therapy Assessment and Plan  Patient Details  Name: Crystal Stein MRN: [id number] Date of Birth: 06-07-1971  PT Diagnosis: Difficulty walking, Hypotonia, Impaired sensation, Muscle spasms, Muscle weakness, Pain in bil UEs, trunk and bil LEs and Quadriplegia Rehab Potential: Good ELOS: 28 days   Today's Date: 02/02/2014 Time: 0800-0900 Time Calculation (min): 60 min  Problem List:  Patient Active Problem List   Diagnosis Date Noted  . 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 Guillain Barr syndrome 01/21/2014  . Quadriplegia and quadriparesis 01/21/2014  . Bilateral leg weakness 01/20/2014  . Hyperkalemia 01/19/2014  . UTI (urinary tract infection) 01/19/2014  . Hypokalemia 01/18/2014  . 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 Acute encephalopathy 01/17/2014  . Substance abuse 01/17/2014  . Sinus tachycardia 01/17/2014  . Elevated transaminase level 01/17/2014  . Metabolic acidosis 46/96/2952  . Chest pressure 01/16/2014  . Chest pain 01/16/2014  . Tobacco abuse 01/12/2014  . Dyslipidemia 01/12/2014  . NSTEMI (non-ST elevated myocardial infarction) 01/10/2014  . Left ankle sprain 11/28/2012  . Carpal tunnel syndrome of left wrist 11/15/2012  . Left ankle instability 11/15/2012  . Muscle weakness (generalized) 08/19/2012  . Wrist fracture 08/15/2012  . Wrist pain, left 07/01/2012  . Edema of hand 07/01/2012  . Radius distal fracture 06/16/2012  . Ankle sprain 11/04/2011  . OTHER ABNORMAL BLOOD CHEMISTRY 01/22/2009  . LIVER FUNCTION TESTS, ABNORMAL, HX OF 01/16/2009  . ANEMIA 09/13/2008  . DIARRHEA NOS 09/13/2008  . ABDOMINAL PAIN, GENERALIZED 09/13/2008  . ABDOMINAL PAIN, LOWER 09/13/2008  . ABNORMAL TRANSAMINASE, (LFT'S) 09/13/2008  . RUQ PAIN 08/17/2008  . ACUTE SINUSITIS, UNSPECIFIED 07/23/2008  . ACUTE BRONCHITIS 07/23/2008  . LATERAL EPICONDYLITIS 04/25/2008  . CUBITAL TUNNEL SYNDROME 02/07/2008  . Pain in Joint, Upper Arm 02/07/2008  .  CLOSED FRACTURE OF SURGICAL NECK OF HUMERUS 02/07/2008  . BIPOLAR DISORDER UNSPECIFIED 12/02/2007  . ANXIETY, CHRONIC 12/02/2007  . NICOTINE ADDICTION 12/02/2007  . NECK PAIN, CHRONIC 12/02/2007    Past Medical History:  Past Medical History  Diagnosis Date  . Anxiety   . Depression   . Asthma   . 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 COPD (chronic obstructive pulmonary disease)   . GERD (gastroesophageal reflux disease)   . Bipolar disorder   . Blood transfusion 2006  . History of cardiac catheterization July 2015    Angiographically normal coronary arteries with distal tortuosity  . NSTEMI (non-ST elevated myocardial infarction)     Not secondary to obstructive CAD or obvious plaque rupture, possibly vasospasm   Past Surgical History:  Past Surgical History  Procedure Laterality Date  . Tennis elbow release  04/27/06    Left; [facility], [facility]  . Multiple tooth extractions  10/07    Full upper  . Orif proximal humerus fracture  08/06    Left,  . Dilation and curettage of uterus  June 2012    Failed ablation  . Tubal ligation  June 2012    [facility]  . Bipolar disorder    . Hysteroscopy  01/27/2011    Procedure: HYSTEROSCOPY;  Surgeon: Jonnie Kind, MD;  Location: [facility];  Service: Gynecology;  Laterality: N/A;  . Vaginal hysterectomy  05/19/2011    Procedure: HYSTERECTOMY VAGINAL;  Surgeon: Jonnie Kind, MD;  Location: [facility];  Service: Gynecology;  Laterality: N/A;  . Orif wrist fracture  06/17/2012    Procedure: OPEN REDUCTION INTERNAL FIXATION (ORIF) WRIST FRACTURE;  Surgeon: Carole Civil, MD;  Location: [facility];  Service: Orthopedics;  Laterality: Left;  . Carpal tunnel release  06/17/2012    Procedure: CARPAL TUNNEL RELEASE;  Surgeon: Carole Civil, MD;  Location: [facility];  Service: Orthopedics;  Laterality: Left;    Assessment & Plan Clinical Impression:   Patient transferred to CIR on 02/01/2014 .   Patient currently requires total  assist +2with mobility secondary to muscle weakness and muscle joint tightness and impaired timing and sequencing, abnormal tone, unbalanced muscle activation and decreased motor planning.  Prior to hospitalization, patient was independent  with mobility and lived with Family in a House home.  Home access is 5Stairs to enter.  Patient will benefit from skilled PT intervention to maximize safe functional mobility, minimize fall risk and decrease caregiver burden for planned discharge home with 24 hour assist.  Anticipate patient will benefit from follow up [facility] at discharge.  PT - End of Session Activity Tolerance: Tolerates < 10 min activity, no significant change in vital signs Endurance Deficit: Yes PT Assessment Rehab Potential: Good Barriers to Discharge: Inaccessible home environment;Decreased caregiver support PT Patient demonstrates impairments in the following area(s): Balance;Behavior;Endurance;Motor;Pain;Safety;Sensory;Skin Integrity PT Transfers Functional Problem(s): Bed Mobility;Bed to Chair;Furniture;Car PT Locomotion Functional Problem(s): Wheelchair Mobility;Ambulation;Stairs PT Plan PT Intensity: Minimum of 1-2 x/day ,45 to 90 minutes PT Frequency: 5 out of 7 days PT Duration Estimated Length of Stay: 28 days PT Treatment/Interventions: Ambulation/gait training;Balance/vestibular training;Cognitive remediation/compensation;Discharge planning;Community reintegration;DME/adaptive equipment instruction;Functional electrical stimulation;Functional mobility training;Patient/family education;Pain management;Neuromuscular re-education;Psychosocial support;Splinting/orthotics;Therapeutic Exercise;Therapeutic Activities;Stair training;UE/LE Strength taining/ROM;UE/LE Coordination activities;Wheelchair propulsion/positioning PT Transfers Anticipated Outcome(s): supervision PT Locomotion Anticipated Outcome(s): supervision w/c x 150; gait and stairs goals TBD PT Recommendation Follow Up  Recommendations: Skilled nursing facility Patient destination:  (unsure at this time) Equipment Recommended: To be determined  Skilled Therapeutic Intervention- eval limited by pt's pain and fatigue; activity tolerance- pt sat bedside with bil feet barely supported, x 5 minutes; adjusted tilt in space w/c parts for maximum function and comfort.  Pillows placed under forearms, pancake call bell placed near R elbow, quick release belt applied for safety in w/c.  PT Evaluation Precautions/Restrictions- falls     Pain Pain Assessment Pain Assessment: 0-10 Pain Score: 5  Pain Type: Acute pain Pain Descriptors / Indicators: Aching Pain Intervention(s): Medication (See eMAR) Home Living/Prior Functioning-  Home Living Type of Home: House Home Access: Stairs to enter Entrance Stairs-Number of Steps: 5 Entrance Stairs-Rails: Right;Left Home Layout: One level Additional Comments: Pt currently staying at parents house due to custody issues with children. pt plans to return to [place] residence at some point.   Lives With: Family Prior Function Level of Independence: Independent with gait;Independent with transfers  Able to Take Stairs?: Yes Driving: Yes Vocation: Unemployed Leisure: Hobbies-yes (Comment) Comments: reading, going to the pool, walking for exercise Vision/Perception- reading glasses     Cognition-  Overall Cognitive Status: Within Functional Limits for tasks assessed Orientation Level: Oriented X4 Sensation Sensation Light Touch: Impaired Detail Light Touch Impaired Details: Impaired RLE;Impaired LLE (RLE with some sensation medially; LLE with some sensation plantar foot, knee) Proprioception: Impaired Detail Proprioception Impaired Details: Absent LLE;Absent RLE Coordination Heel Shin Test: NT due to weakness Motor - spastic quadriplegia; muscle spasms RLe during eval    Mobility Bed Mobility Bed Mobility: Rolling Right;Rolling Left;Right Sidelying to  Sit Rolling Right: 3: Mod assist Rolling Right Details: Manual facilitation for placement;Verbal cues for technique Rolling Left: 3: Mod assist Rolling Left Details: Manual facilitation for placement;Verbal cues for technique Right Sidelying to Sit:  2: Max assist Transfers Transfers: Yes Lateral/Scoot Transfers: 1: +2 Total assist Lateral/Scoot Transfer Details: Manual facilitation for placement;Verbal cues for technique Locomotion  Ambulation Ambulation: No Gait Gait: No Stairs / Additional Locomotion Stairs: No Wheelchair Mobility Wheelchair Mobility: No  Trunk/Postural Assessment  Cervical Assessment Cervical Assessment: Within Functional Limits Thoracic Assessment Thoracic Assessment: Exceptions to [name] (kyphotic) Lumbar Assessment Lumbar Assessment: Exceptions to [facility] (limited by pain); Postural Control Postural Control: Deficits on evaluation; sits in posterior pelvic tilt and kyphotic trunk Protective Responses: limited due to strength  Balance Balance Balance Assessed: Yes Static Sitting Balance Static Sitting - Level of Assistance: 5: Stand by assistance Extremity Assessment      RLE Assessment RLE Assessment: Exceptions to [facility] RLE PROM (degrees) RLE Overall PROM Comments: SLR limited to approx 45 degrees due to pain, tightness; ankle DF limited to 5 degrees RLE Strength RLE Overall Strength Comments: grossly 2+/5 hip flex; at least 2/4 hip abd/ add/; 2-/5 knee ext; 1+/5 ankle DF RLE Tone RLE Tone Comments: hypotonic LLE Assessment LLE Assessment: Exceptions to [facility] (hamstrings and heel cord tight and painful) LLE Strength LLE Overall Strength Comments: grossly 2/5 hip, 2-/5 knee ext; ankle DF NT LLE Tone LLE Tone Comments: hypotonic  FIM:  FIM - Bed/Chair Transfer Bed/Chair Transfer: 1: Two helpers FIM - Locomotion: Wheelchair Locomotion: Wheelchair: 1: Total Assistance/staff pushes wheelchair (Pt<25%) FIM - Locomotion: Ambulation Locomotion:  Ambulation: 0: Activity did not occur FIM - Locomotion: Stairs Locomotion: Stairs: 0: Activity did not occur   Refer to Care Plan for Long Term Goals  Recommendations for other services: Neuropsych; pt has hx of anxiety  Discharge Criteria: Patient will be discharged from PT if patient refuses treatment 3 consecutive times without medical reason, if treatment goals not met, if there is a change in medical status, if patient makes no progress towards goals or if patient is discharged from hospital.  The above assessment, treatment plan, treatment alternatives and goals were discussed and mutually agreed upon: by patient  Gracelin Weisberg 02/02/2014, 1:34 PM

## 2014-02-02 NOTE — Progress Notes (Signed)
Physical Therapy Session Note  Patient Details  Name: Crystal Stein MRN: [id number] Date of Birth: 1971-01-23  Today's Date: 02/02/2014 Time: [id number] Time Calculation (min): 61 min  Short Term Goals: Week 1:  PT Short Term Goal 1 (Week 1): pt will roll R with min assist after set-up PT Short Term Goal 2 (Week 1): pt will transfer bed > w/c with assist of 1 person consistently PT Short Term Goal 3 (Week 1): pt will tolerate sitting up in w/c x 2 hours at least once per day PT Short Term Goal 4 (Week 1): pt will demonstrate R or L lateral lean x 30 seconds with assistance in order to perform pressure relief in sitting  Skilled Therapeutic Interventions/Progress Updates:  Pt. Received semi-reclined in bed asleep. Pt. Aroused and agreeable to therapy session with understanding she is "very tired."  1:1 Treatment focused on BUE and BLE strengthing while supine in bed. Incorporated bridging into bed mobility with max A for scooting head of bed. Pt. Unable to sequence BUE to aid; therapist required to maintain feet position and [facility]. BLE exercised were AAROM due to joint stability, with emphasis on proprioception (visually aided, and with eyes closed). Pt. Fatigues quickly and requires frequent therapeutic rest/recovery breaks throughout session. BUE strengthening: shoulder flexion and elbow flexion to tolerance with emphasis on proprioceptive positioning with therapist assisted positioning, tactile feedback, and patient trial and error trials. Pt. Has limited ankle control and presents internally rotated feet. Pt. Able to self-correct BLEs with practice and instructed on maintaining positions while in bed when tolerated. Pt. Continues to have distal<proximal joint control/fine motor skills in BUEs with reported numbness. Facilitated proprioceptive activities that were appropriate for patient and patient able to make adjustments with visual confirmation. Pt. Remained  supine in bed throughout session; positioned for rest/recovery prior to session ending.  Pt. Supine in bed with feet and head elevated with all needs within reach.  Pain: unrated pain. Numbness with tingling was only complaint.   Therapy Documentation Precautions:  Precautions Precautions: Fall Restrictions Weight Bearing Restrictions: No  See FIM for current functional status  Therapy/Group: Individual Therapy  Raiford NobleSmith, Debria Broecker R 02/02/2014, 5:53 PM

## 2014-02-03 ENCOUNTER — Encounter (HOSPITAL_COMMUNITY): Payer: Medicaid Other | Admitting: *Deleted

## 2014-02-03 ENCOUNTER — Inpatient Hospital Stay (HOSPITAL_COMMUNITY): Payer: Medicaid Other | Admitting: Physical Therapy

## 2014-02-03 ENCOUNTER — Inpatient Hospital Stay (HOSPITAL_COMMUNITY): Payer: Medicaid Other | Admitting: *Deleted

## 2014-02-03 DIAGNOSIS — M79609 Pain in unspecified limb: Secondary | ICD-10-CM

## 2014-02-03 LAB — POCT I-STAT, CHEM 8
BUN: 11 mg/dL (ref 6–23)
CALCIUM ION: 1.27 mmol/L — AB (ref 1.12–1.23)
Chloride: 99 mEq/L (ref 96–112)
Creatinine, Ser: 0.4 mg/dL — ABNORMAL LOW (ref 0.50–1.10)
GLUCOSE: 95 mg/dL (ref 70–99)
HEMATOCRIT: 27 % — AB (ref 36.0–46.0)
Hemoglobin: 9.2 g/dL — ABNORMAL LOW (ref 12.0–15.0)
Potassium: 3.3 mEq/L — ABNORMAL LOW (ref 3.7–5.3)
Sodium: 135 mEq/L — ABNORMAL LOW (ref 137–147)
TCO2: 23 mmol/L (ref 0–100)

## 2014-02-03 MED ORDER — METOPROLOL SUCCINATE ER 50 MG PO TB24
50.0000 mg | ORAL_TABLET | Freq: Every day | ORAL | Status: DC
  Administered 2014-02-04 – 2014-02-05 (×2): 50 mg via ORAL
  Filled 2014-02-03 (×3): qty 1

## 2014-02-03 MED ORDER — ISOSORBIDE MONONITRATE ER 30 MG PO TB24
30.0000 mg | ORAL_TABLET | Freq: Every day | ORAL | Status: DC
  Administered 2014-02-04 – 2014-02-05 (×2): 30 mg via ORAL
  Filled 2014-02-03 (×3): qty 1

## 2014-02-03 MED ORDER — POTASSIUM CHLORIDE CRYS ER 20 MEQ PO TBCR
20.0000 meq | EXTENDED_RELEASE_TABLET | Freq: Every day | ORAL | Status: DC
  Administered 2014-02-03 – 2014-02-21 (×19): 20 meq via ORAL
  Filled 2014-02-03 (×20): qty 1

## 2014-02-03 MED ORDER — ISOSORBIDE MONONITRATE ER 60 MG PO TB24
60.0000 mg | ORAL_TABLET | Freq: Every day | ORAL | Status: DC
  Filled 2014-02-03: qty 1

## 2014-02-03 MED ORDER — METOPROLOL SUCCINATE ER 100 MG PO TB24
100.0000 mg | ORAL_TABLET | Freq: Every day | ORAL | Status: DC
  Filled 2014-02-03: qty 1

## 2014-02-03 NOTE — Progress Notes (Signed)
Physical Therapy Session Note  Patient Details  Name: Crystal Stein MRN: [id number] Date of Birth: Nov 19, 1970  Today's Date: 02/03/2014 Time: 1034-1100 Time Calculation (min): 26 min  Short Term Goals: Week 1:  PT Short Term Goal 1 (Week 1): pt will roll R with min assist after set-up PT Short Term Goal 2 (Week 1): pt will transfer bed > w/c with assist of 1 person consistently PT Short Term Goal 3 (Week 1): pt will tolerate sitting up in w/c x 2 hours at least once per day PT Short Term Goal 4 (Week 1): pt will demonstrate R or L lateral lean x 30 seconds with assistance in order to perform pressure relief in sitting  Skilled Therapeutic Interventions/Progress Updates:  Pt OOB to chair x 60 minutes. Pt transferred w/c to edge of bed with total A. Pt tolerated edge of bed with S to min A about 10 minutes. While on edge of bed worked on dynamic sitting balance with B UE exercises, unilateral and bilateral UEs. Pt transferred edge of bed to supine with total Ax1.   Therapy Documentation Precautions:  Precautions Precautions: Fall Precaution Comments: bilateral ue/le, fear of falling  Restrictions Weight Bearing Restrictions: No General:  Pain: Pt c/o mild pain B LEs.      See FIM for current functional status  Therapy/Group: Individual Therapy  Rayford HalstedMitchell, Isac Lincks G 02/03/2014, 12:31 PM

## 2014-02-03 NOTE — Progress Notes (Signed)
*  PRELIMINARY RESULTS* Vascular Ultrasound Lower extremity venous duplex has been completed.  Preliminary findings: No evidence of DVT.  Farrel DemarkJill Eunice, RDMS, RVT  02/03/2014, 12:39 PM

## 2014-02-03 NOTE — Progress Notes (Signed)
Physical Therapy Session Note  Patient Details  Name: Crystal Stein MRN: [id number] Date of Birth: 02/28/1971  Today's Date: 02/03/2014 Time: [id number] Time Calculation (min): 43 min  Short Term Goals: Week 1:  PT Short Term Goal 1 (Week 1): pt will roll R with min assist after set-up PT Short Term Goal 2 (Week 1): pt will transfer bed > w/c with assist of 1 person consistently PT Short Term Goal 3 (Week 1): pt will tolerate sitting up in w/c x 2 hours at least once per day PT Short Term Goal 4 (Week 1): pt will demonstrate R or L lateral lean x 30 seconds with assistance in order to perform pressure relief in sitting  Skilled Therapeutic Interventions/Progress Updates:  Pt was seen bedside in the am. Performed LE exercises in bed for LE strengthening and flexibility. Pt transferred supine to edge of bed with total A. Pt tolerated edge of bed about 5 mins with S to min A. Pt transferred edge of bed to w/c with sliding board and total A.   Therapy Documentation Precautions:  Precautions Precautions: Fall Precaution Comments: bilateral ue/le, fear of falling  Restrictions Weight Bearing Restrictions: No General:   Pain: Pt c/o mod pain B LEs.      See FIM for current functional status  Therapy/Group: Individual Therapy  Rayford HalstedMitchell, Crystal Stein 02/03/2014, 12:27 PM

## 2014-02-03 NOTE — Progress Notes (Signed)
Patient on room air with Sp02=98%. Breath sounds clear at this time. NIF=-50 with Vital capacity=1.9L

## 2014-02-03 NOTE — Progress Notes (Signed)
Occupational Therapy Session Note  Patient Details  Name: Crystal DikeJennifer P Givan MRN: [id number] Date of Birth: December 14, 1970  Today's Date: 02/03/2014 Time:  - 1100-1200  (60  Min)  1st session    Short Term Goals: Week 1:  OT Short Term Goal 1 (Week 1): Pt will feed self 50% of  meal with setup OT Short Term Goal 2 (Week 1): pt will don shirt with min a  OT Short Term Goal 3 (Week 1): pt will tolerate sitting eob for one adl task with steady assist for 5 min OT Short Term Goal 4 (Week 1): pt will roll in bed with min a with bed rail for LB clothing management  Skilled Therapeutic Interventions/Progress Updates:   1st session:   Addressed muscle weakness, decreased sensation, decreased cardiorespiratoy endurance, impaired timing and sequencing, unbalanced muscle activation, decreased coordination, decreased motor planning, pain LEs and decreased sitting balance, decreased standing balance, decreased postural control, decreased balance strategies and difficulty maintaining precautions     Engaged in therapeutic bathing and dressing in supine and EOB.  Utilized bath mit on right and left hand to bathe LB with HOB at 45 degrees.  Pt had increased pain with hip lateral movements internally and externally,  Provideded stretching to all extremities.  Practiced sitting EOB and pt bathed UB using bathing mit.  Transferred with SB to wc with max assist.    2nd session:  Time:  1515-1600  (45 min) Pain 10/10 left shouler with movement; 7/10 with rest and heat.  Performed BUE PROM and AAROM.  Positioned RUE into depression and external rotation for pain relief.  Used hot pack for 15 mins.  Repositioned on right side with better comfort and less pain.  Left pt in bed with call bell in reach.       Therapy Documentation Precautions:  Precautions Precautions: Fall Precaution Comments: bilateral ue/le, fear of falling  Restrictions Weight Bearing Restrictions: No  ADL: ADL ADL Comments: see fim        See FIM for current functional status  Therapy/Group: Individual Therapy  Humberto Sealsdwards, Latressa Harries J 02/03/2014, 11:12 AM

## 2014-02-03 NOTE — Progress Notes (Signed)
Subjective/Complaints: 43 y.o. female with history of depression with anxiety, recent NSTEMI, who was readmitted on 01/16/14 with recurrent chest pain as well as confusion. CTA chest negative for PE and new hepatomegaly and hepatic steatosis noted. UDS positive for marijuana and benzos. Cardiology consulted and felt that symptoms due to psychosocial issues. Patient reported difficulty walking and well as BLE weakness on 07/29 and MRI lumbar spine without disc herniation or stenosis. 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 She was treated with a round of IVIG without much improvement. Patient with stable NIF/VC but with elevated ESR.  HD cath was placed by IVR and she was started on PLEX due to minimal response with IVIG. Confusion has resolved. Kleb UTI treated with cipro and foley placed due to urinary retention. Therapy ongoing and patient continues to have high levels of anxiety as well as fear with mobility.  ( hx as outlined by Dr. Kirstein- date 8/14)  She has no specific complaints today Denies pain  Objective: Vital Signs: Blood pressure 99/72, pulse 100, temperature 97.5 F (36.4 C), temperature source Oral, resp. rate 18, height 5' 4" (1.626 m), weight 119 lb 11.4 oz (54.3 kg), last menstrual period 04/23/2011, SpO2 97.00%.  nad Chest- CTA CV- reg Rate abd- soft, NT Ext- no edema    Assessment/Plan: 1. 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  DVT Prophylaxis/Anticoagulation: Pharmaceutical: Lovenox 3. Neuropathy/Pain Management: Increase Neurontin to 300 mg qid.  - no pain 02/03/2014   4. Bipolar disorder/Mood: Team to provide ego support. Will have LCSW  follow for evaluation and support. Neuro psych evaluation also.  klonopin rather than short acting agent 5. Neuropsych: This patient is capable of making decisions on her own behalf. 6. Skin/Wound Care: Pressure relief measures to prevent breakdown.  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 To continue qod  X 4 per neurology.    8. Hyponatremia: resolved Basic Metabolic Panel:    Component Value Date/Time   NA 135* 02/02/2014 2141   K 3.3* 02/02/2014 2141   CL 99 02/02/2014 2141   CO2 24 02/02/2014 0434   BUN 11 02/02/2014 2141   CREATININE 0.40* 02/02/2014 2141   GLUCOSE 95 02/02/2014 2141   CALCIUM 9.0 02/02/2014 0434    9. Hypokalemia: Supplement     10. Neurogenic bladder: Will leave foley in place till mobility improves. Recheck UCS.   11. Mild anemia: recheck CBC in am.    12. Abnormal LFTs: Hepatitis panel negative. - no concerns Hepatic Function Latest Ref Rng 02/02/2014 01/31/2014 01/29/2014  Total Protein 6.0 - 8.3 g/dL 5.8(L) 7.1 7.2  Albumin 3.5 - 5.2 g/dL 3.4(L) 2.2(L) 2.0(L)  AST 0 - 37 U/L 56(H) 63(H) 95(H)  ALT 0 - 35 U/L 16 26 35  Alk Phosphatase 39 - 117 U/L 74 151(H) 179(H)  Total Bilirubin 0.3 - 1.2 mg/dL 0.5 0.5 0.5  Bilirubin, Direct 0.0 - 0.3 mg/dL - <0.2 <0.2    13. Hypotension- will hold metoprolol and isosorbide for sbp <110 and will decrease dose of both   LOS (Days) 2 A FACE TO FACE EVALUATION WAS PERFORMED  , HENRY 02/03/2014, 9:23 AM    

## 2014-02-04 ENCOUNTER — Inpatient Hospital Stay (HOSPITAL_COMMUNITY): Payer: Medicaid Other

## 2014-02-04 ENCOUNTER — Inpatient Hospital Stay (HOSPITAL_COMMUNITY): Payer: Medicaid Other | Admitting: *Deleted

## 2014-02-04 ENCOUNTER — Inpatient Hospital Stay (HOSPITAL_COMMUNITY): Payer: Medicaid Other | Admitting: Occupational Therapy

## 2014-02-04 DIAGNOSIS — G8250 Quadriplegia, unspecified: Secondary | ICD-10-CM

## 2014-02-04 LAB — URINE CULTURE

## 2014-02-04 MED ORDER — NITROFURANTOIN MONOHYD MACRO 100 MG PO CAPS
100.0000 mg | ORAL_CAPSULE | Freq: Two times a day (BID) | ORAL | Status: AC
  Administered 2014-02-04 – 2014-02-18 (×28): 100 mg via ORAL
  Filled 2014-02-04 (×29): qty 1

## 2014-02-04 NOTE — Progress Notes (Signed)
Occupational Therapy Session Note  Patient Details  Name: Crystal Stein MRN: [id number] Date of Birth: Oct 28, 1970  Today's Date: 02/04/2014 Time: 1340-1440 Time Calculation (min): 60 min  Skilled Therapeutic Interventions/Progress Updates: Patient c/o SOB, "really tired" more than usual upon approach.   Vitals:  BP 83/58.  O2=96.  HR=106.  Discussed vital with RN who alerted MD and neuorologist.  Patient able to work supine with head of bed elevated at time for UE stretching, assistance with HOH to drink from cup with straw.   Patient stated she was stressed because her husband (she stated she is separated) will not allow her children to visit her, nor did he allow her to visit them before she became sick.  Patient's parent arrived at end of session and appeared very supportive.   She stated they would leave Crystal Stein's dad's cell # so they could talk with the doctor re: Crystal Stein's functional status since "she cannot remember much."  They stated confusion and concern regarding her cardiac situation and had questions for the doctor.    Therapy Documentation Precautions:  Precautions Precautions: Fall Precaution Comments: bilateral ue/le, fear of falling  Restrictions Weight Bearing Restrictions: No General:   Vital Signs: O2 =96 room air during therapy session Therapy Vitals Pulse Rate: 106 BP: 83/58 mmHg Patient Position (if appropriate): Lying Pain: only when moving during this therapy session. RN had already given pain meds  See FIM for current functional status  Therapy/Group: Individual Therapy  Bud Faceickett, Cote Mayabb [facility] 02/04/2014, 3:51 PM

## 2014-02-04 NOTE — Progress Notes (Signed)
Occupational Therapy Session Note  Patient Details  Name: Victorino DikeJennifer P Printz MRN: [id number] Date of Birth: 1970-10-03  Today's Date: 02/04/2014 Time: [id number] Time Calculation (min): 45 min  Short Term Goals: Week 1:  OT Short Term Goal 1 (Week 1): Pt will feed self 50% of  meal with setup OT Short Term Goal 2 (Week 1): pt will don shirt with min a  OT Short Term Goal 3 (Week 1): pt will tolerate sitting eob for one adl task with steady assist for 5 min OT Short Term Goal 4 (Week 1): pt will roll in bed with min a with bed rail for LB clothing management  Skilled Therapeutic Interventions/Progress Updates:    Pt. Sitting in tilt in space wc upon OT arrival.  Engaged in BUE AROM, AAROM, and PROM in planes.  LUE has better AROM than RUE.  Pt prefers to use RUE since that is her dominant side.  Used RUE to brush teeth with built up handle with minimal assist.  Combed hair with wide toothed comb and max assist.  Needs adaptation for better grip for comb. Used both UE to hold phone but unable to pick up still.   Pt. Remained in tilt in space at end of session with all needs in reach.    Therapy Documentation Precautions:  Precautions Precautions: Fall Precaution Comments: bilateral ue/le, fear of falling  Restrictions Weight Bearing Restrictions: No      Pain: Pain Assessment Pain Assessment: Faces Pain Score: 6  Faces Pain Scale: Hurts little more ADL: ADL ADL Comments: see fim        See FIM for current functional status  Therapy/Group: Individual Therapy  Humberto Sealsdwards, Kissie Ziolkowski J 02/04/2014, 5:44 PM

## 2014-02-04 NOTE — Progress Notes (Signed)
Called to room by PT [facility].  Patient appears very anxious, and when asked what was wrong she said "I'm scared I'm going to fall." [facility]  Patient very anxious during transfer but very relieved once in chair.  Will continue to monitor.  Dani Gobbleeardon, Charmeka Freeburg J, RN

## 2014-02-04 NOTE — Progress Notes (Signed)
Spoke with Dr. Posey ReaPlotnikov regarding patients low BP of 83/58.  She was asymptomatic, just fatigued.  She also stated she had difficulty catching her breath but her O2 was normal at 96% on RA.  HR 106.  Dr. Posey ReaPlotnikov thinks it is related to pain medication and said to offer her the lowest dose of pain medication ordered first instead of giving higher doses at this time. Will continue to monitor patient closely.  Dani Gobbleeardon, Cecilia Nishikawa J, RN

## 2014-02-04 NOTE — Progress Notes (Signed)
Urine culture results back + MRSA.  Placed patient on contact precautions.  Educated patient on what MRSA and contact precautions mean for treatment.  Patient understands, will be calling her potential visitors (family) to give them notice ahead of time.  Dr. Posey ReaPlotnikov notified, started patient on macrobid BID x 2 weeks.  Dani Gobbleeardon, Damaris Geers J, RN

## 2014-02-04 NOTE — Progress Notes (Signed)
NIF=-52 with Vital Capacity of 2.1 LPM

## 2014-02-04 NOTE — Progress Notes (Signed)
NIF and VC performed.  NIF of -50 and VC of 1.1L.  Patient tolerated well.

## 2014-02-04 NOTE — Progress Notes (Signed)
Physical Therapy Session Note  Patient Details  Name: Crystal Stein MRN: [id number] Date of Birth: 27-Feb-1971  Today's Date: 02/04/2014 Time: [id number] Time Calculation (min): 30 min  Short Term Goals: Week 1:  PT Short Term Goal 1 (Week 1): pt will roll R with min assist after set-up PT Short Term Goal 2 (Week 1): pt will transfer bed > w/c with assist of 1 person consistently PT Short Term Goal 3 (Week 1): pt will tolerate sitting up in w/c x 2 hours at least once per day PT Short Term Goal 4 (Week 1): pt will demonstrate R or L lateral lean x 30 seconds with assistance in order to perform pressure relief in sitting  Skilled Therapeutic Interventions/Progress Updates:    Session focused on functional mobility, decreasing anxiety w/ movement. Pt received supine in bed, agreeable to participate in therapy w/ min encouragement. Pt complaining of generalized pain, vocalized high anxiety with any movements. Therapist provided gentle PROM/AAROM for BLE hip flexion/knee flexion for anxiety management. Pt moved supine>sit w/ ModA w/ HOB elevated. While seated EOB pt required max cues to low breathing and ModA to maintain seated balance. After max encouragement pt agreed to try transferring into w/c w/ sliding board. Pt w/ x1 complaint of chest pain. Therapist called RN to room, after soothing breathing technique pt denied any pain and vocalized high anxiety "I'm terrified of falling". Pt w/ TotalA to transfer bed>w/c w/ sliding board and reposition hips in chair. Pt left tilted back in tilt in space chair w/ all needs within reach.  Therapy Documentation Precautions:  Precautions Precautions: Fall Precaution Comments: bilateral ue/le, fear of falling  Restrictions Weight Bearing Restrictions: No Vital Signs: Therapy Vitals Temp: 98.9 F (37.2 C) Temp src: Oral Pulse Rate: 116 Resp: 18 BP: 93/70 mmHg Patient Position (if appropriate): Sitting Oxygen Therapy SpO2: 99 % O2 Device: None  (Room air) Pain: Pain Assessment Pain Assessment: Faces Pain Score: 6  Faces Pain Scale: Hurts little more Pain Type: Acute pain Pain Location: Shoulder Pain Orientation: Right Pain Descriptors / Indicators: Aching Pain Frequency: Constant Pain Onset: On-going Patients Stated Pain Goal: 3 Pain Intervention(s): Medication (See eMAR)  See FIM for current functional status  Therapy/Group: Individual Therapy  Hosie SpangleGodfrey, Edgar Reisz Hosie SpangleJess Angenette Daily, PT, DPT 02/04/2014, 5:10 PM

## 2014-02-04 NOTE — Progress Notes (Signed)
Paged neurology (Dr. Cyril Mourningamillo) to inquire about therapeutic plasma exchange for patient.  Neuro note says treatment is supposed to be QOD x 5 treatments, but no order to treat today.  Dr. Cyril Mourningamillo says he does not have privileges to order such a treatment and to follow up with neurology tomorrow.  Also notified Dr. Cyril Mourningamillo of patients low BP, in which he said just to watch and if patient becomes symptomatic, follow up with our MD.  Dani Gobbleeardon, Anner Baity J, RN

## 2014-02-04 NOTE — Progress Notes (Signed)
Subjective/Complaints: 43 y.o. female with history of depression with anxiety, recent NSTEMI, who was readmitted on 01/16/14 with recurrent chest pain as well as confusion. CTA chest negative for PE and new hepatomegaly and hepatic steatosis noted. UDS positive for marijuana and benzos. Cardiology consulted and felt that symptoms due to psychosocial issues. Patient reported difficulty walking and well as BLE weakness on 07/29 and MRI lumbar spine without disc herniation or stenosis. 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 She was treated with a round of IVIG without much improvement. Patient with stable NIF/VC but with elevated ESR.  HD cath was placed by IVR and she was started on PLEX due to minimal response with IVIG. Confusion has resolved. Kleb UTI treated with cipro and foley placed due to urinary retention. Therapy ongoing and patient continues to have high levels of anxiety as well as fear with mobility.  ( hx as outlined by Dr. Read Drivers- date 8/14)  Complains of R>L shoulder pain today. She states that her pain comes and goes. She also is bothered by ongoing "numbness" of extremities.   Objective: Vital Signs: Blood pressure 117/83, pulse 115, temperature 97.8 F (36.6 C), temperature source Oral, resp. rate 18, height '5\' 4"'  (1.626 m), weight 119 lb 0.8 oz (54 kg), last menstrual period 04/23/2011, SpO2 97.00%.  nad Chest- CTA CV- reg Rate abd- soft, NT Ext- no edema  Pain with abduction of right shoulder   Assessment/Plan: 1. 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  DVT Prophylaxis/Anticoagulation: Pharmaceutical: Lovenox 3. Neuropathy/Pain Management:  Increase Neurontin to 300 mg qid.  -will continue same dose for now  4. Bipolar disorder/Mood: Team to provide ego support. Will have LCSW follow for evaluation and support. Neuro psych evaluation also.  klonopin rather than short acting agent. She has a markedly FLAT affect 5. Neuropsych: This patient is capable of making decisions on her own behalf. 6. Skin/Wound Care: Pressure relief measures to prevent breakdown.  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 To continue qod  X 4 per neurology.    8. Hyponatremia: resolved Basic Metabolic Panel:    Component Value Date/Time   NA 135* 02/02/2014 2141   K 3.3* 02/02/2014 2141   CL 99 02/02/2014 2141   CO2 24 02/02/2014 0434   BUN 11 02/02/2014 2141   CREATININE 0.40* 02/02/2014 2141   GLUCOSE 95 02/02/2014 2141   CALCIUM 9.0 02/02/2014 0434    9. Hypokalemia: Supplement     10. Neurogenic bladder: Will leave foley in place till mobility improves. Recheck UCS.   11. Mild anemia: recheck CBC in am.    12. Abnormal LFTs: Hepatitis panel negative. - no concerns Hepatic Function Latest Ref Rng 02/02/2014 01/31/2014 01/29/2014  Total Protein 6.0 - 8.3 g/dL 5.8(L) 7.1 7.2  Albumin 3.5 - 5.2 g/dL 3.4(L) 2.2(L) 2.0(L)  AST 0 - 37 U/L 56(H) 63(H) 95(H)  ALT 0 - 35 U/L 16 26 35  Alk Phosphatase 39 - 117 U/L 74 151(H) 179(H)  Total Bilirubin 0.3 - 1.2 mg/dL 0.5 0.5 0.5  Bilirubin, Direct 0.0 - 0.3 mg/dL - <0.2 <0.2    13. Hypotension- will hold metoprolol and isosorbide  for sbp <110 and will decrease dose of both bp somewhat improved: 90-117/64-83  LOS (Days) 3 A FACE TO FACE EVALUATION WAS PERFORMED  Crystal Stein 02/04/2014, 9:01 AM

## 2014-02-05 ENCOUNTER — Encounter (HOSPITAL_COMMUNITY): Payer: Medicaid Other

## 2014-02-05 ENCOUNTER — Encounter: Payer: Medicaid Other | Admitting: Physician Assistant

## 2014-02-05 ENCOUNTER — Inpatient Hospital Stay (HOSPITAL_COMMUNITY): Payer: Medicaid Other | Admitting: Physical Therapy

## 2014-02-05 ENCOUNTER — Inpatient Hospital Stay (HOSPITAL_COMMUNITY): Payer: Medicaid Other | Admitting: Occupational Therapy

## 2014-02-05 DIAGNOSIS — G610 Guillain-Barre syndrome: Secondary | ICD-10-CM

## 2014-02-05 LAB — CBC
HCT: 28.2 % — ABNORMAL LOW (ref 36.0–46.0)
HEMOGLOBIN: 9.6 g/dL — AB (ref 12.0–15.0)
MCH: 33 pg (ref 26.0–34.0)
MCHC: 34 g/dL (ref 30.0–36.0)
MCV: 96.9 fL (ref 78.0–100.0)
PLATELETS: 304 10*3/uL (ref 150–400)
RBC: 2.91 MIL/uL — AB (ref 3.87–5.11)
RDW: 15.9 % — ABNORMAL HIGH (ref 11.5–15.5)
WBC: 7.2 10*3/uL (ref 4.0–10.5)

## 2014-02-05 LAB — POCT I-STAT, CHEM 8
BUN: 11 mg/dL (ref 6–23)
CREATININE: 0.5 mg/dL (ref 0.50–1.10)
Calcium, Ion: 1.27 mmol/L — ABNORMAL HIGH (ref 1.12–1.23)
Chloride: 96 mEq/L (ref 96–112)
Glucose, Bld: 96 mg/dL (ref 70–99)
HCT: 31 % — ABNORMAL LOW (ref 36.0–46.0)
Hemoglobin: 10.5 g/dL — ABNORMAL LOW (ref 12.0–15.0)
POTASSIUM: 4.4 meq/L (ref 3.7–5.3)
SODIUM: 132 meq/L — AB (ref 137–147)
TCO2: 25 mmol/L (ref 0–100)

## 2014-02-05 LAB — BASIC METABOLIC PANEL
Anion gap: 14 (ref 5–15)
BUN: 10 mg/dL (ref 6–23)
CALCIUM: 9.4 mg/dL (ref 8.4–10.5)
CO2: 23 mEq/L (ref 19–32)
Chloride: 98 mEq/L (ref 96–112)
Creatinine, Ser: 0.37 mg/dL — ABNORMAL LOW (ref 0.50–1.10)
GFR calc Af Amer: >90 mL/min (ref >90)
Glucose, Bld: 94 mg/dL (ref 70–99)
Potassium: 4.1 mEq/L (ref 3.7–5.3)
Sodium: 135 mEq/L — ABNORMAL LOW (ref 137–147)

## 2014-02-05 LAB — TROPONIN I

## 2014-02-05 MED ORDER — CALCIUM GLUCONATE 10 % IV SOLN
4.0000 g | Freq: Once | INTRAVENOUS | Status: AC
  Administered 2014-02-05: 4 g via INTRAVENOUS
  Filled 2014-02-05: qty 40

## 2014-02-05 MED ORDER — CALCIUM CARBONATE ANTACID 500 MG PO CHEW
CHEWABLE_TABLET | ORAL | Status: AC
  Filled 2014-02-05: qty 4

## 2014-02-05 MED ORDER — ACD FORMULA A 0.73-2.45-2.2 GM/100ML VI SOLN
500.0000 mL | Status: DC
  Administered 2014-02-05: 500 mL via INTRAVENOUS
  Filled 2014-02-05 (×2): qty 500

## 2014-02-05 MED ORDER — ACD FORMULA A 0.73-2.45-2.2 GM/100ML VI SOLN
Status: AC
  Administered 2014-02-05: 500 mL via INTRAVENOUS
  Filled 2014-02-05: qty 500

## 2014-02-05 MED ORDER — SODIUM CHLORIDE 0.9 % IV SOLN
INTRAVENOUS | Status: AC
  Administered 2014-02-05 (×3): via INTRAVENOUS_CENTRAL
  Filled 2014-02-05 (×3): qty 200

## 2014-02-05 MED ORDER — ACETAMINOPHEN 325 MG PO TABS: 650.0000 mg | ORAL_TABLET | ORAL | Status: DC | PRN

## 2014-02-05 MED ORDER — HEPARIN SODIUM (PORCINE) 1000 UNIT/ML IJ SOLN
1000.0000 [IU] | Freq: Once | INTRAMUSCULAR | Status: AC
  Administered 2014-02-05: 1000 [IU]
  Filled 2014-02-05: qty 1

## 2014-02-05 MED ORDER — CALCIUM CARBONATE ANTACID 500 MG PO CHEW
2.0000 | CHEWABLE_TABLET | ORAL | Status: AC
  Filled 2014-02-05 (×2): qty 2

## 2014-02-05 NOTE — Progress Notes (Signed)
Recreational Therapy Assessment and Plan  Patient Details  Name: Crystal Stein MRN: [id number] Date of Birth: 02/14/1971 Today's Date: 02/05/2014  Rehab Potential: Good ELOS: 4 weeks   Assessment Clinical Impression:  Problem List:  Patient Active Problem List    Diagnosis  Date Noted   .  GBS (Guillain-Barre syndrome)  02/01/2014   .  Guillain Barr syndrome  01/21/2014   .  Quadriplegia and quadriparesis  01/21/2014   .  Bilateral leg weakness  01/20/2014   .  Hyperkalemia  01/19/2014   .  UTI (urinary tract infection)  01/19/2014   .  Hypokalemia  01/18/2014   .  Stress-induced cardiomyopathy  01/17/2014   .  Acute encephalopathy  01/17/2014   .  Substance abuse  01/17/2014   .  Sinus tachycardia  01/17/2014   .  Elevated transaminase level  01/17/2014   .  Metabolic acidosis  57/84/6962   .  Chest pressure  01/16/2014   .  Chest pain  01/16/2014   .  Tobacco abuse  01/12/2014   .  Dyslipidemia  01/12/2014   .  NSTEMI (non-ST elevated myocardial infarction)  01/10/2014   .  Left ankle sprain  11/28/2012   .  Carpal tunnel syndrome of left wrist  11/15/2012   .  Left ankle instability  11/15/2012   .  Muscle weakness (generalized)  08/19/2012   .  Wrist fracture  08/15/2012   .  Wrist pain, left  07/01/2012   .  Edema of hand  07/01/2012   .  Radius distal fracture  06/16/2012   .  Ankle sprain  11/04/2011   .  OTHER ABNORMAL BLOOD CHEMISTRY  01/22/2009   .  LIVER FUNCTION TESTS, ABNORMAL, HX OF  01/16/2009   .  ANEMIA  09/13/2008   .  DIARRHEA NOS  09/13/2008   .  ABDOMINAL PAIN, GENERALIZED  09/13/2008   .  ABDOMINAL PAIN, LOWER  09/13/2008   .  ABNORMAL TRANSAMINASE, (LFT'S)  09/13/2008   .  RUQ PAIN  08/17/2008   .  ACUTE SINUSITIS, UNSPECIFIED  07/23/2008   .  ACUTE BRONCHITIS  07/23/2008   .  LATERAL EPICONDYLITIS  04/25/2008   .  CUBITAL TUNNEL SYNDROME  02/07/2008   .  Pain in Joint, Upper Arm  02/07/2008   .  CLOSED FRACTURE OF SURGICAL NECK OF  HUMERUS  02/07/2008   .  BIPOLAR DISORDER UNSPECIFIED  12/02/2007   .  ANXIETY, CHRONIC  12/02/2007   .  NICOTINE ADDICTION  12/02/2007   .  NECK PAIN, CHRONIC  12/02/2007    Past Medical History:  Past Medical History   Diagnosis  Date   .  Anxiety    .  Depression    .  Asthma    .  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  COPD (chronic obstructive pulmonary disease)    .  GERD (gastroesophageal reflux disease)    .  Bipolar disorder    .  Blood transfusion  2006   .  History of cardiac catheterization  July 2015     Angiographically normal coronary arteries with distal tortuosity   .  NSTEMI (non-ST elevated myocardial infarction)      Not secondary to obstructive CAD or obvious plaque rupture, possibly vasospasm    Past Surgical History:  Past Surgical History   Procedure  Laterality  Date   .  Tennis elbow release  04/27/06     Left; [facility], [facility]   .  Multiple tooth extractions   10/07     Full upper   .  Orif proximal humerus fracture   08/06     Left,   .  Dilation and curettage of uterus   June 2012     Failed ablation   .  Tubal ligation   June 2012     [facility]   .  Bipolar disorder     .  Hysteroscopy   01/27/2011     Procedure: HYSTEROSCOPY; Surgeon: Jonnie Kind, MD; Location: [facility]; Service: Gynecology; Laterality: N/A;   .  Vaginal hysterectomy   05/19/2011     Procedure: HYSTERECTOMY VAGINAL; Surgeon: Jonnie Kind, MD; Location: [facility]; Service: Gynecology; Laterality: N/A;   .  Orif wrist fracture   06/17/2012     Procedure: OPEN REDUCTION INTERNAL FIXATION (ORIF) WRIST FRACTURE; Surgeon: Carole Civil, MD; Location: [facility]; Service: Orthopedics; Laterality: Left;   .  Carpal tunnel release   06/17/2012     Procedure: CARPAL TUNNEL RELEASE; Surgeon: Carole Civil, MD; Location: [facility]; Service: Orthopedics; Laterality: Left;    Assessment & Plan  Clinical Impression: Patient is a 43 y.o. year old female with history of  depression with anxiety, recent NSTEMI, who was readmitted on 01/16/14 with recurrent chest pain as well as confusion. CTA chest negative for PE and new hepatomegaly and hepatic steatosis noted. UDS positive for marijuana and benzos. Cardiology consulted and felt that symptoms due to psychosocial issues. Patient reported difficulty walking and well as BLE weakness on 07/29 and MRI lumbar spine without disc herniation or stenosis. 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 She was treated with a round of IVIG without much improvement. Patient with stable NIF/VC but with elevated ESR. HD cath was placed by IVR and she was started on PLEX due to minimal response with IVIG. Confusion has resolved. Kleb UTI treated with cipro and foley placed due to urinary retention. Therapy ongoing and patient continues to have high levels of anxiety as well as fear with mobility. Noted to have BLE ataxia with instability as well as neuropathy with BUE affecting mobility. On 01/31/2014 patient had an episode of chest pain. EKG showed sinus tachycardia. Troponin was normal MD, therapy team recommending CIR to lessen burden of care and patient admitted today. Patient transferred to CIR on 02/01/2014.  Pt presents with decreased activity tolerance, decreased functional mobility, decreased balance,  decreased coordination, increased pain & anxiety, difficulty maintaining precaution Limiting pt's independence with leisure/community pursuits.   Leisure History/Participation Premorbid leisure interest/current participation: [employer];Community - Travel (Comment);Sports - Exercise (Comment) Expression Interests: Music (Comment) Other Leisure Interests:  Reading;Television Leisure Participation Style: With Family/Friends Awareness of Community Resources: Good-identify 3 post discharge leisure resources Psychosocial / Spiritual Does patient have pets?: Yes (cat) Social interaction - Mood/Behavior: Cooperative Academic librarian Appropriate for Education?: Yes Recreational Therapy Orientation Orientation -Reviewed with patient: Available activity resources Strengths/Weaknesses Patient Strengths/Abilities: Willingness to participate;Active premorbidly Patient weaknesses: Physical limitations TR Patient demonstrates impairments in the following area(s): Endurance;Motor;Pain;Safety;Sensory;Skin Integrity  Plan Rec Therapy Plan Is patient appropriate for Therapeutic Recreation?: Yes Rehab Potential: Good Treatment times per week: Min 1 time per week >  20 minutes Estimated Length of Stay: 4 weeks TR Treatment/Interventions: Adaptive equipment instruction;1:1 session;Balance/vestibular training;Functional mobility training;Community reintegration;Patient/family education;Therapeutic activities;Recreation/leisure participation;Therapeutic exercise;UE/LE Coordination activities;Wheelchair propulsion/positioning Recommendations for other services: Neuropsych  Recommendations for other services: Neuropsych  Discharge Criteria: Patient will be discharged from TR if patient refuses treatment 3 consecutive times without medical reason.  If treatment goals not met, if there is a change in medical status, if patient makes no progress towards goals or if patient is discharged from hospital.  The above assessment, treatment plan, treatment alternatives and goals were discussed and mutually agreed upon: by patient  Hewitt 02/05/2014, 3:40 PM

## 2014-02-05 NOTE — Progress Notes (Signed)
Occupational Therapy Session Note  Patient Details  Name: Crystal DikeJennifer P Guisinger MRN: [id number] Date of Birth: [birth year]  Today's Date: 02/05/2014 OT Individual Time: 1401-1501 OT Individual Time Calculation (min): 60 min  Short Term Goals: Week 1:  OT Short Term Goal 1 (Week 1): Pt will feed self 50% of  meal with setup OT Short Term Goal 2 (Week 1): pt will don shirt with min a  OT Short Term Goal 3 (Week 1): pt will tolerate sitting eob for one adl task with steady assist for 5 min OT Short Term Goal 4 (Week 1): pt will roll in bed with min a with bed rail for LB clothing management  Skilled Therapeutic Interventions/Progress Updates:  Patient sleeping in bed upon arrival.  Engaged in bed mobility, BUE stretching, UB, LB, and core strengthening, and self feeding.  Patient required mod assist to bend both knees yet once positioned, she was able to stabilize BLEs in this position then continued with hip ad & abduction AROM with focus on controlled movement.  Patient able to then roll to her left with min assist and work on core strengthening in sidelying. BUE A/AAROM exercises in sidelying and supine.  Patient able to [facility] once the bed slightly positioned in trendelenburg and after OT assisted to position her BLEs so she could push through her legs.  Patient able to tolerate HOB all of the way up to perform self drinking from mug with snap on lid and straw as well as feed self 3-4 bites of applesauce with RUE using R dorsal wrist support with u-cuff.  Therapy Documentation Precautions:  Precautions Precautions: Fall Precaution Comments: bilateral ue/le, fear of falling  Restrictions Weight Bearing Restrictions: No Pain: 8/10 pain in BLEs, repositioned, rest, activity.  RN notified and medication provided. ADL: See FIM for current functional status  Therapy/Group: Individual Therapy  Mansa Willers 02/05/2014, 3:07 PM

## 2014-02-05 NOTE — Plan of Care (Signed)
Problem: RH BLADDER ELIMINATION Goal: RH STG MANAGE BLADDER WITH ASSISTANCE STG Manage Bladder With Assistance. Min A  Foley for retention

## 2014-02-05 NOTE — Progress Notes (Signed)
Patient with orthostatic changes with standing attempts. Note that metoprolol as well as imdur added this weekend. Patient has had resting tachycardia with HR 110-120 at rest but also has low blood pressure to GBS and deconditioning. Will d/c medications as not needed at this time. May need low dose inderal for mood stabilization and tachycardia if HR further increases with activity.

## 2014-02-05 NOTE — Progress Notes (Signed)
NIF -50, VC 2.2L. Good effort.

## 2014-02-05 NOTE — Progress Notes (Signed)
TPE 3/5 completed without issue. Next treatment Wednesday 8/19 per schedule. Pt has no complaints.

## 2014-02-05 NOTE — Progress Notes (Signed)
Physical Therapy Session Note  Patient Details  Name: Crystal Stein MRN: [id number] Date of Birth: 1970/08/20  Today's Date: 02/05/2014 PT Individual Time: 0905-1005 PT Individual Time Calculation (min): 60 min   Short Term Goals: Week 1:  PT Short Term Goal 1 (Week 1): pt will roll R with min assist after set-up PT Short Term Goal 2 (Week 1): pt will transfer bed > w/c with assist of 1 person consistently PT Short Term Goal 3 (Week 1): pt will tolerate sitting up in w/c x 2 hours at least once per day PT Short Term Goal 4 (Week 1): pt will demonstrate R or L lateral lean x 30 seconds with assistance in order to perform pressure relief in sitting  Skilled Therapeutic Interventions/Progress Updates:   Pt received in bed; reporting pain all over but reporting receiving pain medication earlier.  Discussed with pt PLOF and plan at D/C.  Pt reports she will D/C to parents' home which is one level but 5-6 STE.  Discussed with patient possible need for w/c at D/C and need for parents to start looking into having a ramp built.  Pt performed AAROM of bilat LE and assisted bridges in supine to assist with donning shorts and shoes.  Performed bed mobility rolling to L side and L side > sit EOB with total A +2 with pt performing 20% of LE management.  Pt sat EOB with min-mod A of PTA while PT set up w/c for transfer.  Pt performed slideboard transfer bed > w/c with total A with therapist providing verbal and tactile cues for safe hand placement and cues to keep fingers flexed during transfer to prevent over stretch of finger flexors during wrist extension to maintain tenodesis.  Pt very anxious during transfers and requesting anti-anxiety medication; RN alerted.  In gym pt set up in standing frame harness with pt performing reaching and anterior/posterior leaning mod A.  Pt brought to standing in frame with total A; pt only able to tolerated ~10-15 seconds in standing secondary to stretching discomfort in  bilat gastroc and reporting feeling very dizzy.  Pt returned to sitting and BP assessed; low BP noted and reported to PA and RN; PA reports pt started on BP medication over the weekend which may be dropping her too low; PA to discontinue BP medicaton.  PA stating to hold on TED hose and re-assess pt tolerance to upright once BP medication cleared.  Pt returned to room and set up in tilted position in w/c to await OT session.       Therapy Documentation Precautions:  Precautions Precautions: Fall Precaution Comments: bilateral ue/le, fear of falling  Restrictions Weight Bearing Restrictions: No Vital Signs: Therapy Vitals Pulse Rate: 138 BP: 78/49 mmHg Patient Position (if appropriate): Standing Oxygen Therapy SpO2: 98 % O2 Device: None (Room air) Pain: Pain Assessment Pain Assessment: 0-10 Pain Score: 9  Pain Type: Chronic pain Pain Location: Other (Comment) (C/o pain from neck down) Pain Orientation: Other (Comment) (bilateral sides distal of neck) Pain Descriptors / Indicators: Constant;Radiating;Aching;Dull Pain Frequency: Intermittent Pain Onset: On-going Patients Stated Pain Goal: 3 Pain Intervention(s): Medication (See eMAR) Multiple Pain Sites: No Locomotion : Wheelchair Mobility Distance: 150   See FIM for current functional status  Therapy/Group: Individual Therapy  Crystal Stein, Crystal Stein 02/05/2014, 12:24 PM

## 2014-02-05 NOTE — Progress Notes (Signed)
Physical Therapy Session Note  Patient Details  Name: Crystal Stein MRN: [id number] Date of Birth: May 27, 1971  Today's Date: 02/05/2014  PT present for therapy session and took-over treatment from PTA.    Raiford NobleSmith, [name] 02/05/2014, 9:10 AM

## 2014-02-05 NOTE — Progress Notes (Signed)
Subjective/Complaints: 43 y.o. female with history of depression with anxiety, recent NSTEMI, who was readmitted on 01/16/14 with recurrent chest pain as well as confusion. CTA chest negative for PE and new hepatomegaly and hepatic steatosis noted. UDS positive for marijuana and benzos. Cardiology consulted and felt that symptoms due to psychosocial issues. Patient reported difficulty walking and well as BLE weakness on 07/29 and MRI lumbar spine without disc herniation or stenosis. 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 She was treated with a round of IVIG without much improvement. Patient with stable NIF/VC but with elevated ESR.  HD cath was placed by IVR and she was started on PLEX due to minimal response with IVIG. Confusion has resolved. Kleb UTI treated with cipro and foley placed due to urinary retention. Therapy ongoing and patient continues to have high levels of anxiety as well as fear with mobility.  Pt has been on xanax PTA, ?abuse Pt feels her heart is pounding because of anxiety  Objective: Vital Signs: Blood pressure 115/89, pulse 110, temperature 98 F (36.7 C), temperature source Oral, resp. rate 18, height '5\' 4"'  (1.626 m), weight 52.1 kg (114 lb 13.8 oz), last menstrual period 04/23/2011, SpO2 96.00%. No results found. Results for orders placed during the hospital encounter of 02/01/14 (from the past 72 hour(s))  POCT I-STAT, CHEM 8     Status: Abnormal   Collection Time    02/02/14  9:41 PM      Result Value Ref Range   Sodium 135 (*) 137 - 147 mEq/L   Potassium 3.3 (*) 3.7 - 5.3 mEq/L   Chloride 99  96 - 112 mEq/L   BUN 11  6 - 23 mg/dL   Creatinine, Ser 0.40 (*) 0.50 - 1.10 mg/dL   Glucose, Bld 95  70 - 99 mg/dL   Calcium, Ion 1.27 (*) 1.12 -  1.23 mmol/L   TCO2 23  0 - 100 mmol/L   Hemoglobin 9.2 (*) 12.0 - 15.0 g/dL   HCT 27.0 (*) 36.0 - 46.0 %     HEENT: normal Cardio: tachy and no murmur Resp: CTA B/L and unlabored GI: BS positive and NT,ND Extremity:  Pulses positive and No Edema Skin:   Intact 3/5 bilateral deltoids  3 minus/5 bilateral bicep  2 minus/5 bilateral tricep  3 minus/5 left finger flexors  Trace right finger flexors  0/5 bilateral wrist extensors  2 minus/5 right hip flexors  Trace left hip flexors  Trace bilateral knee extensors  2 minus/5 bilateral hip extensors  Trace right total flexion and extension  0/5 left total flexion and extension    Assessment/Plan: 1. 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 Physiatrist is providing close team supervision and 24 hour management of active medical problems listed below. Physiatrist and rehab team continue to assess barriers to discharge/monitor patient progress toward functional and medical goals. FIM: FIM - Bathing Bathing: 1: Total-Patient completes 0-2 of 10 parts or less than 25%  FIM - Upper Body Dressing/Undressing Upper body dressing/undressing steps patient completed: Thread/unthread right sleeve of pullover shirt/dresss;Thread/unthread left sleeve of pullover shirt/dress Upper body dressing/undressing: 3: Mod-Patient completed 50-74% of tasks FIM - Lower Body Dressing/Undressing Lower body dressing/undressing:  1: Two helpers        FIM - IT sales professional Transfer: 3: Supine > Sit: Mod A (lifting assist/Pt. 50-74%/lift 2 legs;1: Bed > Chair or W/C: Total A (helper does all/Pt. < 25%)  FIM - Locomotion: Wheelchair Locomotion: Wheelchair: 0: Activity did not occur FIM - Locomotion: Ambulation Locomotion: Ambulation: 0: Activity did not occur  Comprehension Comprehension Mode:  Auditory Comprehension: 5-Follows basic conversation/direction: With extra time/assistive device  Expression Expression Mode: Verbal Expression: 5-Expresses basic needs/ideas: With extra time/assistive device  Social Interaction Social Interaction: 4-Interacts appropriately 75 - 89% of the time - Needs redirection for appropriate language or to initiate interaction.  Problem Solving Problem Solving: 5-Solves basic 90% of the time/requires cueing < 10% of the time  Memory Memory: 6-More than reasonable amt of time  Medical Problem List and Plan: 1. 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  DVT Prophylaxis/Anticoagulation: Pharmaceutical: Lovenox 3. Neuropathy/Pain Management: Increase Neurontin to 300 mg qid.    4. Bipolar disorder/Mood:  Ego support, neuropsych assessment. klonopin 5. Neuropsych: This patient is capable of making decisions on her own behalf. 6. Skin/Wound Care: Pressure relief measures to prevent breakdown. Encourage boosting when [place]. Has very low protein stores--added nutritional supplement. 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 Hyponatremia:  improved 9. Hypokalemia: supplementing, re-check    10. Neurogenic bladder: continue foley. Has MRSA---will rx with macrodantin  -contact precautions 11. Mild anemia: recheck CBC     12. Abnormal LFTs: Hepatitis panel negative.    -follow up    LOS (Days) 4 A FACE TO FACE EVALUATION WAS PERFORMED  SWARTZ,ZACHARY T 02/05/2014, 8:16 AM

## 2014-02-05 NOTE — Progress Notes (Signed)
Occupational Therapy Session Note  Patient Details  Name: Crystal DikeJennifer P Winberg MRN: [id number] Date of Birth: [birth year]  Today's Date: 02/05/2014 OT Individual Time: 1000-1100 OT Individual Time Calculation (min): 60 min   Short Term Goals: Week 1:  OT Short Term Goal 1 (Week 1): Pt will feed self 50% of  meal with setup OT Short Term Goal 2 (Week 1): pt will don shirt with min a  OT Short Term Goal 3 (Week 1): pt will tolerate sitting eob for one adl task with steady assist for 5 min OT Short Term Goal 4 (Week 1): pt will roll in bed with min a with bed rail for LB clothing management  Skilled Therapeutic Interventions/Progress Updates:     Pt seated in w/c upon arrival.  Pt stated she was "exhausted" from previous therapy but agreeable to engaging in BADL retraining at bed level.  Pt performed sliding board transfer w/c->bed with tot A +2.  Pt performed bathing and dressing tasks supine with HOB elevated.  [facility] to bathe face and abdomen.  Pt was able to thread BUE into shirt sleeve in supported seating but required assistance to don over head and pull down in back.  Pt initiates rolling right and left in bed but required max A to complete task.  Pt required multiple rest breaks throughout session.  Focus on activity tolerance, bed mobility, transfers, and BADLs.  Therapy Documentation Precautions:  Precautions Precautions: Fall Precaution Comments: bilateral ue/le, fear of falling  Restrictions Weight Bearing Restrictions: No   Pain: Pain Assessment Pain Assessment: 0-10 Pain Score: 9  Pain Type: Chronic pain Pain Location: Other (Comment) (C/o pain from neck down) Pain Orientation: Other (Comment) (bilateral sides distal of neck) Pain Descriptors / Indicators: Constant;Radiating;Aching;Dull Pain Frequency: Intermittent Pain Onset: On-going Patients Stated Pain Goal: 3 Pain Intervention(s):RN aware and repositioned Multiple Pain Sites: No ADL: ADL ADL  Comments: see fim Exercises:   Other Treatments:    See FIM for current functional status  Therapy/Group: Individual Therapy  Rich BraveLanier, Ha Placeres Chappell 02/05/2014, 11:56 AM

## 2014-02-05 NOTE — Progress Notes (Signed)
NIF -50, VC=1.2L

## 2014-02-06 ENCOUNTER — Inpatient Hospital Stay (HOSPITAL_COMMUNITY): Payer: Medicaid Other | Admitting: Occupational Therapy

## 2014-02-06 ENCOUNTER — Inpatient Hospital Stay (HOSPITAL_COMMUNITY): Payer: Medicaid Other | Admitting: *Deleted

## 2014-02-06 LAB — GLUCOSE, CAPILLARY: Glucose-Capillary: 93 mg/dL (ref 70–99)

## 2014-02-06 LAB — TROPONIN I

## 2014-02-06 MED ORDER — ALPRAZOLAM 0.5 MG PO TABS
0.5000 mg | ORAL_TABLET | Freq: Three times a day (TID) | ORAL | Status: DC
  Administered 2014-02-06 – 2014-02-21 (×44): 0.5 mg via ORAL
  Filled 2014-02-06 (×45): qty 1

## 2014-02-06 NOTE — Progress Notes (Signed)
Physical Therapy Session Note  Patient Details  Name: Crystal DikeJennifer P Mcmahill MRN: [id number] Date of Birth: 1971/06/12  Today's Date: 02/06/2014 PT Individual Time: 1000-1100 PT Individual Time Calculation (min): 60 min   Short Term Goals: Week 1:  PT Short Term Goal 1 (Week 1): pt will roll R with min assist after set-up PT Short Term Goal 2 (Week 1): pt will transfer bed > w/c with assist of 1 person consistently PT Short Term Goal 3 (Week 1): pt will tolerate sitting up in w/c x 2 hours at least once per day PT Short Term Goal 4 (Week 1): pt will demonstrate R or L lateral lean x 30 seconds with assistance in order to perform pressure relief in sitting  Skilled Therapeutic Interventions/Progress Updates:    Co-tx w/ RT Misty StanleyLisa for safety and mutual goals of distraction and pain management. Pt received supine in bed agreeable to participate in therapy w/ min encouragement. Pt continues to demonstrate high anxiety w/ all mobility and requires frequent distraction and deep breathing techniques during rest breaks in order to accomplish mobility tasks. Pt moved supine>sit w/ Mod-MaxA, able to sit EOB w/ close S to MinGuard A depending on setup and fatigue. Pt w/ TotalA sliding board transfer to R bed>w/c, TotalA to reposition hips back in chair. Therapist w/ gentle stretches to hamstrings and gastroc-soleus complex. Pt reported pain in calf during stretches in mid-range of available motion, ? Neural tension vs tight musculature vs anticipatory pain reaction. Pt stood 45-60 seconds in standing frame before needing to sit down due to pain in knee caps. Pt allowed to sit and tilt back for extended rest break, then refused another stand despite suggestion from therapist for increased padding in front of knee caps. Pt agreed to sliding board transfer training and seated balance work. TotalA for SBT w/c<>mat table, then completed seated reaching activities across midline and outside BOS w/ MinGuard to MinA to  maintain seated balance. Pt w/ TotalA to propel w/c back to room, left seated in w/c w/ all needs within reach.  Therapy Documentation Precautions:  Precautions Precautions: Fall Precaution Comments: bilateral ue/le, fear of falling  Restrictions Weight Bearing Restrictions: No Vital Signs: Therapy Vitals Temp: 98 F (36.7 C) Temp src: Oral Pulse Rate: 104 Resp: 18 BP: 110/78 mmHg Patient Position (if appropriate): Lying Oxygen Therapy SpO2: 98 % O2 Device: None (Room air) Pain: Pain Assessment Pain Score: 5   See FIM for current functional status  Therapy/Group: Individual Therapy  Hosie SpangleGodfrey, Joylene Wescott Hosie SpangleJess Maize Brittingham, PT, DPT 02/06/2014, 7:51 AM

## 2014-02-06 NOTE — Progress Notes (Signed)
Social Work  Social Work Assessment and Plan  Patient Details  Name: Crystal Stein MRN: [id number] Date of Birth: 1970/08/27  Today's Date: 02/02/2014  Problem List:  Patient Active Problem List   Diagnosis Date Noted  . 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 Guillain Barr syndrome 01/21/2014  . Quadriplegia and quadriparesis 01/21/2014  . Bilateral leg weakness 01/20/2014  . Hyperkalemia 01/19/2014  . UTI (urinary tract infection) 01/19/2014  . Hypokalemia 01/18/2014  . 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 Acute encephalopathy 01/17/2014  . Substance abuse 01/17/2014  . Sinus tachycardia 01/17/2014  . Elevated transaminase level 01/17/2014  . Metabolic acidosis 01/17/2014  . Chest pressure 01/16/2014  . Chest pain 01/16/2014  . Tobacco abuse 01/12/2014  . Dyslipidemia 01/12/2014  . NSTEMI (non-ST elevated myocardial infarction) 01/10/2014  . Left ankle sprain 11/28/2012  . Carpal tunnel syndrome of left wrist 11/15/2012  . Left ankle instability 11/15/2012  . Muscle weakness (generalized) 08/19/2012  . Wrist fracture 08/15/2012  . Wrist pain, left 07/01/2012  . Edema of hand 07/01/2012  . Radius distal fracture 06/16/2012  . Ankle sprain 11/04/2011  . OTHER ABNORMAL BLOOD CHEMISTRY 01/22/2009  . LIVER FUNCTION TESTS, ABNORMAL, HX OF 01/16/2009  . ANEMIA 09/13/2008  . DIARRHEA NOS 09/13/2008  . ABDOMINAL PAIN, GENERALIZED 09/13/2008  . ABDOMINAL PAIN, LOWER 09/13/2008  . ABNORMAL TRANSAMINASE, (LFT'S) 09/13/2008  . RUQ PAIN 08/17/2008  . ACUTE SINUSITIS, UNSPECIFIED 07/23/2008  . ACUTE BRONCHITIS 07/23/2008  . LATERAL EPICONDYLITIS 04/25/2008  . CUBITAL TUNNEL SYNDROME 02/07/2008  . Pain in Joint, Upper Arm 02/07/2008  . CLOSED FRACTURE OF SURGICAL NECK OF HUMERUS 02/07/2008  . BIPOLAR DISORDER UNSPECIFIED 12/02/2007  . ANXIETY, CHRONIC 12/02/2007  . NICOTINE ADDICTION 12/02/2007  . NECK PAIN, CHRONIC 12/02/2007   Past Medical History:  Past  Medical History  Diagnosis Date  . Anxiety   . Depression   . Asthma   . 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 COPD (chronic obstructive pulmonary disease)   . GERD (gastroesophageal reflux disease)   . Bipolar disorder   . Blood transfusion 2006  . History of cardiac catheterization July 2015    Angiographically normal coronary arteries with distal tortuosity  . NSTEMI (non-ST elevated myocardial infarction)     Not secondary to obstructive CAD or obvious plaque rupture, possibly vasospasm   Past Surgical History:  Past Surgical History  Procedure Laterality Date  . Tennis elbow release  04/27/06    Left; Romeo AppleHarrison, [facility]  . Multiple tooth extractions  10/07    Full upper  . Orif proximal humerus fracture  08/06    Left,  . Dilation and curettage of uterus  June 2012    Failed ablation  . Tubal ligation  June 2012    Ferguson  . Bipolar disorder    . Hysteroscopy  01/27/2011    Procedure: HYSTEROSCOPY;  Surgeon: Tilda BurrowJohn V Ferguson, MD;  Location: [facility];  Service: Gynecology;  Laterality: N/A;  . Vaginal hysterectomy  05/19/2011    Procedure: HYSTERECTOMY VAGINAL;  Surgeon: Tilda BurrowJohn V Ferguson, MD;  Location: [facility];  Service: Gynecology;  Laterality: N/A;  . Orif wrist fracture  06/17/2012    Procedure: OPEN REDUCTION INTERNAL FIXATION (ORIF) WRIST FRACTURE;  Surgeon: Vickki HearingStanley E Harrison, MD;  Location: [facility];  Service: Orthopedics;  Laterality: Left;  . Carpal tunnel release  06/17/2012    Procedure: CARPAL TUNNEL RELEASE;  Surgeon: Vickki HearingStanley E Harrison, MD;  Location: [facility];  Service: Orthopedics;  Laterality: Left;   Social History:  reports that she has been smoking Cigarettes.  She has a 22 pack-year smoking history. She does not have any smokeless tobacco history on file. She reports that she does not drink alcohol or use illicit drugs.  Family / Support Systems Marital Status: Separated How Long?: June 2014;  currently in divorce proceedings. Patient Roles:  Spouse;Parent (Separated from husband and has 3 children.) Children: pt has three children ages 58,12 and 59 who are all in custody of their father. Other Supports: father, Deetta Perla @ [phone] and mother, Alvira Philips @ [phone].  Also in the home is her brother, Anthoney Harada, who receives SSD "for his back".   Anticipated Caregiver: Mom can provide supervision and get patient something to eat.  Pt reports that her brother will assist as well "but he can't lift me" Ability/Limitations of Caregiver: Mom and brother not working and can provide supervision but no physical assistance.  Dad works. Caregiver Availability: 24/7 Family Dynamics: pt reports that she has a good, supportive relationship with her parents and brother.  Denies any concerns about returning to their home at d/c.  Social History Preferred language: English Religion: None Cultural Background: NA Education: HS Read: Yes Write: Yes Employment Status: Unemployed Date Retired/Disabled/Unemployed: since 2000 Legal Hisotry/Current Legal Issues: Legal issues related to divorce and custody issues Guardian/Conservator: None - per MD, pt capable of making decisions on her own behalf   Abuse/Neglect Physical Abuse: Denies Verbal Abuse: Denies Sexual Abuse: Denies Exploitation of patient/patient's resources: Denies Self-Neglect: Denies  Emotional Status Pt's affect, behavior adn adjustment status: Pt lying in bed and reports feeling very fatigued.  Very soft-spoken and difficult to hear at times.  She does admit she is "down" about her current limitations and concerned about how much recovery she might make.  She had returned from [place] just a couple of weeks prior in order to work on her custody issues and to see her children.  Now she is discouraged about this acute illness and it's impact on her abilities to work on that now.  Have referred for neurpsychology consult to further delve into emotional distress and  determine support needed. Recent Psychosocial Issues: As noted, recent return home to parents to work on custody and relationship with her children. Pyschiatric History: Pt does report h/o anxiety and depression for which she takes xanax.  Not currently receiving any formal counseling. Substance Abuse History: Pt denies  Patient / Family Perceptions, Expectations & Goals Pt/Family understanding of illness & functional limitations: Pt and family with very basic understanding of her GBS and report they have never known anyone with this illness.  She has a general understanding of the treatments she is receiving and the purpose of CIR. Premorbid pt/family roles/activities: PT was completely independent at home and in the community. Anticipated changes in roles/activities/participation: pt will likley require 24/7 supervision at a minimum - may need physical assistance dependent on progress. Pt/family expectations/goals: "I need to get to a point that my family doesn't have to physically help me."  [employer]: None Premorbid Home Care/DME Agencies: None Transportation available at discharge: yes Resource referrals recommended: Neuropsychology;Support group (specify)  Discharge Planning Living Arrangements: Parent Support Systems: Parent;Other relatives Type of Residence: Private residence Insurance Resources: Customer service manager Resources: Family Support Financial Screen Referred: Previously completed Living Expenses: Lives with family Money Management: Patient Does the patient have any problems obtaining your medications?: Yes (Describe) (No insurance) Home Management: pt was sharing this  with family PTA Patient/Family Preliminary Plans: Pt hopes to be able to d/c home with parents and brother. Barriers to Discharge: Family Support;Self care (potential barrier if needs physical assistance) Social Work Anticipated Follow Up Needs: HH/OP;SNF;Support Group Expected  length of stay: 4 weeks  Clinical Impression Unfortunate woman here with acute onset of GBS and with quadraparesis.  Will need to be able to reach a supervision level of care to return home.  Family supportive, however, cannot physically assist.  Pt notes h/o depression and anxiety.  Have referred to neuropsych.  Will follow for support and d/c planning needs.  Aries Townley 02/02/2014, 10:36 AM

## 2014-02-06 NOTE — Care Management Note (Signed)
[facility] Individual Statement of Services  Patient Name:  Crystal DikeJennifer P Crafts  Date:  02/02/14  Welcome to the [facility].  Our goal is to provide you with an individualized program based on your diagnosis and situation, designed to meet your specific needs.  With this comprehensive rehabilitation program, you will be expected to participate in at least 3 hours of rehabilitation therapies Monday-Friday, with modified therapy programming on the weekends.  Your rehabilitation program will include the following services:  Physical Therapy (PT), Occupational Therapy (OT), Speech Therapy (ST), 24 hour per day rehabilitation nursing, Therapeutic Recreaction (TR), Neuropsychology, Case Management (Social Worker), Rehabilitation Medicine, Nutrition Services and Pharmacy Services  Weekly team conferences will be held on Tuesdays to discuss your progress.  Your Social Worker will talk with you frequently to get your input and to update you on team discussions.  Team conferences with you and your family in attendance may also be held.  Expected length of stay: 4 weeks  Overall anticipated outcome: minimal assistance  Depending on your progress and recovery, your program may change. Your Social Worker will coordinate services and will keep you informed of any changes. Your Social Worker's name and contact numbers are listed  below.  The following services may also be recommended but are not provided by the [facility]:   Driving Evaluations  Home Health Rehabiltiation Services  Outpatient Rehabilitation Services    Arrangements will be made to provide these services after discharge if needed.  Arrangements include referral to agencies that provide these services.  Your insurance has been verified to be:  None (Medicaid application pending) Your primary doctor is:  None (Child psychotherapistocial Worker will refer to local clinic at discharge)  Pertinent  information will be shared with your doctor and your insurance company.  Social Worker:  [place]cy Asia [place], [place] [phone] or [phone]) [phone]   Information discussed with and copy given to patient by: Amada JupiterHOYLE, Jaydi Bray, 02/02/2014, 10:42 AM

## 2014-02-06 NOTE — Patient Care Conference (Signed)
Inpatient RehabilitationTeam Conference and Plan of Care Update Date: 02/06/2014   Time: 2:10  PM    Patient Name: Crystal DikeJennifer P Scheffler      Medical Record Number: [id number]  Date of Birth: [birth year] Sex: Female         Room/Bed: 4W03C/4W03C-01 Payor Info: Payor: /    Admitting Diagnosis: gbs  Admit Date/Time:  02/01/2014  4:00 PM Admission Comments: No comment available   Primary Diagnosis:  <principal problem not specified> Principal Problem: <principal problem not specified>  Patient Active Problem List   Diagnosis Date Noted  . 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 Guillain Barr syndrome 01/21/2014  . Quadriplegia and quadriparesis 01/21/2014  . Bilateral leg weakness 01/20/2014  . Hyperkalemia 01/19/2014  . UTI (urinary tract infection) 01/19/2014  . Hypokalemia 01/18/2014  . 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 Acute encephalopathy 01/17/2014  . Substance abuse 01/17/2014  . Sinus tachycardia 01/17/2014  . Elevated transaminase level 01/17/2014  . Metabolic acidosis 01/17/2014  . Chest pressure 01/16/2014  . Chest pain 01/16/2014  . Tobacco abuse 01/12/2014  . Dyslipidemia 01/12/2014  . NSTEMI (non-ST elevated myocardial infarction) 01/10/2014  . Left ankle sprain 11/28/2012  . Carpal tunnel syndrome of left wrist 11/15/2012  . Left ankle instability 11/15/2012  . Muscle weakness (generalized) 08/19/2012  . Wrist fracture 08/15/2012  . Wrist pain, left 07/01/2012  . Edema of hand 07/01/2012  . Radius distal fracture 06/16/2012  . Ankle sprain 11/04/2011  . OTHER ABNORMAL BLOOD CHEMISTRY 01/22/2009  . LIVER FUNCTION TESTS, ABNORMAL, HX OF 01/16/2009  . ANEMIA 09/13/2008  . DIARRHEA NOS 09/13/2008  . ABDOMINAL PAIN, GENERALIZED 09/13/2008  . ABDOMINAL PAIN, LOWER 09/13/2008  . ABNORMAL TRANSAMINASE, (LFT'S) 09/13/2008  . RUQ PAIN 08/17/2008  . ACUTE SINUSITIS, UNSPECIFIED 07/23/2008  . ACUTE BRONCHITIS 07/23/2008  . LATERAL EPICONDYLITIS  04/25/2008  . CUBITAL TUNNEL SYNDROME 02/07/2008  . Pain in Joint, Upper Arm 02/07/2008  . CLOSED FRACTURE OF SURGICAL NECK OF HUMERUS 02/07/2008  . BIPOLAR DISORDER UNSPECIFIED 12/02/2007  . ANXIETY, CHRONIC 12/02/2007  . NICOTINE ADDICTION 12/02/2007  . NECK PAIN, CHRONIC 12/02/2007    Expected Discharge Date: Expected Discharge Date:  (4 weeks)  Team Members Present: Physician leading conference: Dr. Faith RogueZachary Swartz Social Worker Present: Amada JupiterLucy Gwendolin Briel, LCSW Nurse Present: Kennyth ArnoldStacey Jennings, RN PT Present: Edman CircleAudra Hall, PT;Jess Glenetta HewGodfrey, PT OT Present: Donzetta KohutFrank Barthold, OT SLP Present: Feliberto Gottronourtney Payne, SLP PPS Coordinator present : Edson SnowballBecky Windsor, PT     Current Status/Progress Goal Weekly Team Focus  Medical   gbs with CAD, pain and anxiety issues are prevalent, urinary retention-foley  improve activity level and pain control  anxiety and pain mgt   Bowel/Bladder   Foley in place, Continent of bowel LBM 02/05/14  To be continent of bladder and bowel  Assess for constipation and the need for the foley to continue   Swallow/Nutrition/ Hydration             ADL's   tot A overall, decreased activity tolerance  min A/supervision overall  activity tolerance, sitting balance, transfers, bed mobility   Mobility   total +2 for bed mobility, basic transfers, orthostatic with standing  supervision w/c level  sitting balance, activity tolerance, transfers   Communication             Safety/Cognition/ Behavioral Observations            Pain   C/o pain in legs and thighs, Oxy IR 10mg  q6hrs, Tramadol 50mg  q6hrs, Robaxin 50mg  q6hrs given to  ease pain   Pain <3  Assess for and treat pain q shift with prn pain meds and muscle relaxers as ordered     Skin   Fissure between buttocks-foam dsg in place   No new skin breakdown  Assess skin q shift any skin breakdown. Turn q 2hrs    Rehab Goals Patient on target to meet rehab goals: Yes *See Care Plan and progress notes for long and short-term  goals.  Barriers to Discharge: anxiety, severity of weakness    Possible Resolutions to Barriers:  rx above    Discharge Planning/Teaching Needs:  home with family to provide supervision - cannot offer physical assistance so may have to change plan to SNF if cannot reach supervision level      Team Discussion:  Anxiety is still very prominent and interferes with tx sessions.  Still total assist with ADLs and mobility.  Anticipate will likely downgrade original goals which will cause concern about family ability to manage at home.  SW to follow up  Revisions to Treatment Plan:  None at this point.   Continued Need for Acute Rehabilitation Level of Care: The patient requires daily medical management by a physician with specialized training in physical medicine and rehabilitation for the following conditions: Daily direction of a multidisciplinary physical rehabilitation program to ensure safe treatment while eliciting the highest outcome that is of practical value to the patient.: Yes Daily medical management of patient stability for increased activity during participation in an intensive rehabilitation regime.: Yes Daily analysis of laboratory values and/or radiology reports with any subsequent need for medication adjustment of medical intervention for : Neurological problems;Other  Dorethea Strubel 02/06/2014, 4:44 PM

## 2014-02-06 NOTE — Progress Notes (Signed)
NIF -50  VC 1.8 l  Good effort noted.  Will continue to monitor.

## 2014-02-06 NOTE — Progress Notes (Signed)
Subjective/Complaints: 43 y.o. female with history of depression with anxiety, recent NSTEMI, who was readmitted on 01/16/14 with recurrent chest pain as well as confusion. CTA chest negative for PE and new hepatomegaly and hepatic steatosis noted. UDS positive for marijuana and benzos. Cardiology consulted and felt that symptoms due to psychosocial issues. Patient reported difficulty walking and well as BLE weakness on 07/29 and MRI lumbar spine without disc herniation or stenosis. 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 She was treated with a round of IVIG without much improvement. Patient with stable NIF/VC but with elevated ESR.  HD cath was placed by IVR and she was started on PLEX due to minimal response with IVIG. Confusion has resolved. Kleb UTI treated with cipro and foley placed due to urinary retention. Therapy ongoing and patient continues to have high levels of anxiety as well as fear with mobility.  Feels better today. Slept fairly well. Denies anxiety, sob, cp, palps  Objective: Vital Signs: Blood pressure 110/78, pulse 104, temperature 98 F (36.7 C), temperature source Oral, resp. rate 18, height '5\' 4"'  (1.626 m), weight 51.1 kg (112 lb 10.5 oz), last menstrual period 04/23/2011, SpO2 98.00%. No results found. Results for orders placed during the hospital encounter of 02/01/14 (from the past 72 hour(s))  BASIC METABOLIC PANEL     Status: Abnormal   Collection Time    02/05/14  9:02 AM      Result Value Ref Range   Sodium 135 (*) 137 - 147 mEq/L   Potassium 4.1  3.7 - 5.3 mEq/L   Chloride 98  96 - 112 mEq/L   CO2 23  19 - 32 mEq/L   Glucose, Bld 94  70 - 99 mg/dL   BUN 10  6 - 23 mg/dL   Creatinine, Ser 0.37 (*) 0.50 - 1.10 mg/dL   Calcium 9.4  8.4 -  10.5 mg/dL   GFR calc non Af Amer >90  >90 mL/min   GFR calc Af Amer >90  >90 mL/min   Comment: (NOTE)     The eGFR has been calculated using the CKD EPI equation.     This calculation has not been validated in all clinical situations.     eGFR's persistently <90 mL/min signify possible Chronic Kidney     Disease.   Anion gap 14  5 - 15  POCT I-STAT, CHEM 8     Status: Abnormal   Collection Time    02/05/14  4:04 PM      Result Value Ref Range   Sodium 132 (*) 137 - 147 mEq/L   Potassium 4.4  3.7 - 5.3 mEq/L   Chloride 96  96 - 112 mEq/L   BUN 11  6 - 23 mg/dL   Creatinine, Ser 0.50  0.50 - 1.10 mg/dL   Glucose, Bld 96  70 - 99 mg/dL   Calcium, Ion 1.27 (*) 1.12 - 1.23 mmol/L   TCO2 25  0 - 100 mmol/L   Hemoglobin 10.5 (*) 12.0 - 15.0 g/dL   HCT 31.0 (*) 36.0 - 46.0 %  TROPONIN I     Status: None   Collection Time    02/05/14  4:06 PM      Result Value Ref Range   Troponin I <0.30  <0.30  ng/mL   Comment:            Due to the release kinetics of cTnI,     a negative result within the first hours     of the onset of symptoms does not rule out     myocardial infarction with certainty.     If myocardial infarction is still suspected,     repeat the test at appropriate intervals.  CBC     Status: Abnormal   Collection Time    02/05/14  4:07 PM      Result Value Ref Range   WBC 7.2  4.0 - 10.5 K/uL   RBC 2.91 (*) 3.87 - 5.11 MIL/uL   Hemoglobin 9.6 (*) 12.0 - 15.0 g/dL   HCT 28.2 (*) 36.0 - 46.0 %   MCV 96.9  78.0 - 100.0 fL   MCH 33.0  26.0 - 34.0 pg   MCHC 34.0  30.0 - 36.0 g/dL   RDW 15.9 (*) 11.5 - 15.5 %   Platelets 304  150 - 400 K/uL  TROPONIN I     Status: None   Collection Time    02/05/14  9:14 PM      Result Value Ref Range   Troponin I <0.30  <0.30 ng/mL   Comment:            Due to the release kinetics of cTnI,     a negative result within the first hours     of the onset of symptoms does not rule out     myocardial infarction with certainty.     If  myocardial infarction is still suspected,     repeat the test at appropriate intervals.  TROPONIN I     Status: None   Collection Time    02/06/14  3:30 AM      Result Value Ref Range   Troponin I <0.30  <0.30 ng/mL   Comment:            Due to the release kinetics of cTnI,     a negative result within the first hours     of the onset of symptoms does not rule out     myocardial infarction with certainty.     If myocardial infarction is still suspected,     repeat the test at appropriate intervals.  GLUCOSE, CAPILLARY     Status: None   Collection Time    02/06/14  6:52 AM      Result Value Ref Range   Glucose-Capillary 93  70 - 99 mg/dL     HEENT: normal Cardio: tachy and no murmur Resp: CTA B/L and unlabored GI: BS positive and NT,ND Extremity:  Pulses positive and No Edema Skin:   Intact 3/5 bilateral deltoids  3 minus/5 bilateral bicep  2 minus/5 bilateral tricep  3 minus/5 left finger flexors  Trace right finger flexors  0/5 bilateral wrist extensors  2 minus/5 right hip flexors  Trace left hip flexors  Trace bilateral knee extensors  2 minus/5 bilateral hip extensors  Trace right total flexion and extension  0/5 left total flexion and extension    Assessment/Plan: 1. 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 Physiatrist is providing close team supervision and 24 hour management of active medical problems listed below. Physiatrist and rehab team continue to assess barriers to discharge/monitor patient progress toward functional and medical goals.  FIM: FIM - Bathing Bathing Steps Patient Completed: Abdomen Bathing: 1: Total-Patient completes 0-2 of 10 parts or less than 25%  FIM - Upper Body Dressing/Undressing Upper body dressing/undressing steps patient completed: Thread/unthread right sleeve of pullover shirt/dresss;Thread/unthread  left sleeve of pullover shirt/dress Upper body dressing/undressing: 3: Mod-Patient completed 50-74% of tasks FIM - Lower Body Dressing/Undressing Lower body dressing/undressing: 1: Total-Patient completed less than 25% of tasks  FIM - Toileting Toileting: 1: Two helpers (per Jolyne Loa, NT report)     FIM - Control and instrumentation engineer Devices: Sliding board;Arm rests;Bed rails Bed/Chair Transfer: 1: Two helpers;1: Supine > Sit: Total A (helper does all/Pt. < 25%);1: Sit > Supine: Total A (helper does all/Pt. < 25%);1: Bed > Chair or W/C: Total A (helper does all/Pt. < 25%);1: Chair or W/C > Bed: Total A (helper does all/Pt. < 25%)  FIM - Locomotion: Wheelchair Distance: 150 Locomotion: Wheelchair: 1: Total Assistance/staff pushes wheelchair (Pt<25%) FIM - Locomotion: Ambulation Locomotion: Ambulation: 0: Activity did not occur  Comprehension Comprehension Mode: Auditory Comprehension: 5-Follows basic conversation/direction: With extra time/assistive device  Expression Expression Mode: Verbal Expression: 5-Expresses basic needs/ideas: With extra time/assistive device  Social Interaction Social Interaction: 4-Interacts appropriately 75 - 89% of the time - Needs redirection for appropriate language or to initiate interaction.  Problem Solving Problem Solving: 5-Solves basic 90% of the time/requires cueing < 10% of the time  Memory Memory: 6-More than reasonable amt of time  Medical Problem List and Plan: 1. 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  DVT Prophylaxis/Anticoagulation: Pharmaceutical: Lovenox 3. Neuropathy/Pain Management: Increase Neurontin to 300 mg qid.    4. Bipolar disorder/Mood:  Ego support, neuropsych assessment. klonopin 5. Neuropsych: This patient is capable of making decisions on her own behalf. 6. Skin/Wound Care: Pressure relief measures to prevent breakdown. Encourage boosting when [place]. Has very low protein stores--added  nutritional supplement. 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 Hyponatremia:  improved 9. Hypokalemia: supplementing, re-check    10. Neurogenic bladder: continue foley. Has MRSA---will rx with macrodantin  -contact precautions 11. Mild anemia: recheck CBC     12. Abnormal LFTs: Hepatitis panel negative.    -follow up  13. CV---imdur and metoprolol held due to orthostasis yesterday-  -need to keep an eye on HR/BP again today  -TEDS/abd binder potentially  -encourage fluids  -a little anemic still but hgb stable   LOS (Days) 5 A FACE TO FACE EVALUATION WAS PERFORMED  Kort Stettler T 02/06/2014, 8:57 AM

## 2014-02-06 NOTE — Consult Note (Signed)
INITIAL DIAGNOSTIC EVALUATION - [facility]   MEDICAL NECESSITY:  Crystal Stein was seen on the [facility] for an initial diagnostic evaluation owing to the patient's diagnosis of Guillan-Barre Syndrome (GBS).   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 CTA chest negative for PE and new hepatomegaly and hepatic steatosis noted. Urine drug screen was positive for marijuana and benzodiazepines. Cardiology consulted and felt that her symptoms were due to psychosocial issues. 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 She was treated with a round of IVIG without much improvement.   During today's visit, Crystal Stein reported suffering from mild memory loss but no major cognitive difficulties. She feels that this began suddenly and coincides with the onset of GBS. Emotionally, she describes herself as feeling "miserable" and terribly anxious, though she did not appear to be in that much distress. She mentioned being quite fearful that she will fall when transitioning from her bed to the wheelchair, though this is abating with time and practice. She has a history of treatment for depression and anxiety. She reports having taken antidepressants in the past as well as Xanax but adamantly said that she has not taken either type of drug in a long time. Her UDS upon admission says otherwise.   In regard to therapy, Crystal Stein is only beginning therapy so she is unsure whether she is making gains. She is enjoying the rehab staff. Her friends and family have been available to  provide support. She discussed being somewhat worried about her ability to participate in therapy owing to fatigue and low energy.   PROCEDURES ADMINISTERED: [1 unit [id number] on 02/05/14] Diagnostic clinical interview  Review of available records  Emotional & Behavioral Evaluation: Crystal Stein was appropriately dressed for season and situation. She was generally friendly and rapport was adequately established. Her voice was hypophonic and raspy but she was able to express ideas effectively. Her affect was somewhat blunted. Attention and motivation were adequate.   From an emotional standpoint, Crystal Stein admitted to suffering from at least a moderate degree of current depression and anxiety in relation to her present medical situation.  No major adjustment issues endorsed. Fatigue and low energy are reported barriers to therapy that have been identified. Suicidal/homicidal ideation, plan or intent was denied. No manic or hypomanic episodes were reported. The patient denied ever experiencing any auditory/visual hallucinations. No major behavioral or personality changes were endorsed.    Overall, Crystal Stein reports suffering from mild memory loss and significant depression and anxiety owing to her present situation. We discussed treatment for mood and I recommended initiating an antidepressant. Patient was agreeable but also asked if she could be prescribed a benzodiazepine. I said that decision would be left to her physician but I encouraged her to try the antidepressant first. I will also follow Crystal Stein throughout her admission for coping and supportive psychotherapy. Cognitive testing may be warranted at some point during her stay or as an outpatient.    RECOMMENDATIONS  Recommendations for treatment team:    Consider implementing an antidepressant, if not medically contraindicated. Fluoxetine might be a good choice owing to  its energizing properties. Use benzo with caution.    Brief counseling for  social support seems warranted during this hospitalization. Please place her on my schedule for next Monday. She can see Dr. Wylene SimmerPollard sooner if necessary.    Will consider neuropsychological screen at next visit.   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 Renly Guedes, Psy.D.  Clinical Neuropsychologist

## 2014-02-06 NOTE — Progress Notes (Signed)
Recreational Therapy Session Note  Patient Details  Name: Crystal Stein MRN: [id number] Date of Birth: 08-19-1970 Today's Date: 02/06/2014  Pain: c/o pain 5/10, premedicated Skilled Therapeutic Interventions/Progress Updates: Session focused primarily on anxiety/pain management during  [place] with PT.  Pt stated feelings of anxiety throughout session with all aspects of mobility including transfers, standing in standing frame & stretching.  Pt required encouragement to participate throughout session and max instructional & demonstration cues for deep breathing techniques with all mobility.  Pt with c/o pain in knee caps in standing frame and refused further standing activities despite encouragement & activity modification. Therapy/Group: Co-Treatment  Markesia Crilly 02/06/2014, 3:39 PM

## 2014-02-06 NOTE — Progress Notes (Signed)
Occupational Therapy Session Note  Patient Details  Name: Crystal Stein MRN: [id number] Date of Birth: 07/27/1970  Today's Date: 02/06/2014 OT Individual Time: 0800-0900 and 100-200 OT Individual Time Calculation (min): 60 min and 60 min  Short Term Goals: Week 1:  OT Short Term Goal 1 (Week 1): Pt will feed self 50% of  meal with setup OT Short Term Goal 2 (Week 1): pt will don shirt with min a  OT Short Term Goal 3 (Week 1): pt will tolerate sitting eob for one adl task with steady assist for 5 min OT Short Term Goal 4 (Week 1): pt will roll in bed with min a with bed rail for LB clothing management  Skilled Therapeutic Interventions/Progress Updates:  1)  Patient sleeping in bed upon arrival.  Engaged in self care retraining to include bath dress and groom tasks at bed level.  Focused on activity tolerance, increased use of BUEs and hands, bed mobility, adaptive techniques.  ADLs performed with HOB up and rolling.  2)  Patient seated in w/c upon arrival.  Educated patient on need for pressure relief to be performed ~ every 20 minutes.  Informed patient that she  needed to be responsible to call when time for staff to tilt w/c back for pressure relief to occur and she said she would try to remember.  Also wrote this information on the safety plan.  Educated patient on need to wear right dorsal wrist support during the day to support her wrist joint and to improve functional use of RUE.  Adapted her tube of Chapstick and her comb to improve ability to manage these items.  Issued bath mitt with velcro strap to assist with bathing and issued plastic-waterproof U-Cuff to be used for brushing her teeth.  Following practice with above items, assisted patient back to bed with total assist squat pivot.   Therapy Documentation Precautions:  Precautions Precautions: Fall Precaution Comments: bilateral ue/le, fear of falling  Restrictions Weight Bearing Restrictions: No Pain:   1)  9/10, "all  over", when asked to describe, she reported, "i think it is more in the muscles that in the joints". RN notified and medication provided, rest and repositioned. 2)  No report of pain ADL: See FIM for current functional status  Therapy/Group: Individual Therapy both sessions  Yuvia Plant 02/06/2014, 9:13 AM

## 2014-02-07 ENCOUNTER — Inpatient Hospital Stay (HOSPITAL_COMMUNITY): Payer: Medicaid Other | Admitting: Occupational Therapy

## 2014-02-07 ENCOUNTER — Inpatient Hospital Stay (HOSPITAL_COMMUNITY): Payer: Medicaid Other | Admitting: Physical Therapy

## 2014-02-07 ENCOUNTER — Inpatient Hospital Stay (HOSPITAL_COMMUNITY): Payer: Medicaid Other | Admitting: *Deleted

## 2014-02-07 DIAGNOSIS — G610 Guillain-Barre syndrome: Secondary | ICD-10-CM

## 2014-02-07 LAB — RETICULOCYTES
RBC.: 2.9 MIL/uL — ABNORMAL LOW (ref 3.87–5.11)
RETIC CT PCT: 4.9 % — AB (ref 0.4–3.1)
Retic Count, Absolute: 142.1 10*3/uL (ref 19.0–186.0)

## 2014-02-07 LAB — CBC
HEMATOCRIT: 28 % — AB (ref 36.0–46.0)
HEMOGLOBIN: 9.7 g/dL — AB (ref 12.0–15.0)
MCH: 33.4 pg (ref 26.0–34.0)
MCHC: 34.6 g/dL (ref 30.0–36.0)
MCV: 96.6 fL (ref 78.0–100.0)
Platelets: 308 10*3/uL (ref 150–400)
RBC: 2.9 MIL/uL — ABNORMAL LOW (ref 3.87–5.11)
RDW: 15.5 % (ref 11.5–15.5)
WBC: 5.9 10*3/uL (ref 4.0–10.5)

## 2014-02-07 LAB — POCT I-STAT, CHEM 8
BUN: 11 mg/dL (ref 6–23)
CHLORIDE: 99 meq/L (ref 96–112)
CREATININE: 0.3 mg/dL — AB (ref 0.50–1.10)
Calcium, Ion: 1.37 mmol/L — ABNORMAL HIGH (ref 1.12–1.23)
GLUCOSE: 99 mg/dL (ref 70–99)
HCT: 31 % — ABNORMAL LOW (ref 36.0–46.0)
Hemoglobin: 10.5 g/dL — ABNORMAL LOW (ref 12.0–15.0)
POTASSIUM: 3.9 meq/L (ref 3.7–5.3)
Sodium: 134 mEq/L — ABNORMAL LOW (ref 137–147)
TCO2: 21 mmol/L (ref 0–100)

## 2014-02-07 MED ORDER — CALCIUM CARBONATE ANTACID 500 MG PO CHEW
2.0000 | CHEWABLE_TABLET | ORAL | Status: AC
  Administered 2014-02-07: 400 mg via ORAL
  Filled 2014-02-07 (×2): qty 2

## 2014-02-07 MED ORDER — SODIUM CHLORIDE 0.9 % IV SOLN
INTRAVENOUS | Status: AC
  Administered 2014-02-07 (×3): via INTRAVENOUS_CENTRAL
  Filled 2014-02-07: qty 200

## 2014-02-07 MED ORDER — OXYCODONE HCL 5 MG PO TABS
ORAL_TABLET | ORAL | Status: AC
  Administered 2014-02-07: 10 mg
  Filled 2014-02-07: qty 2

## 2014-02-07 MED ORDER — DIPHENHYDRAMINE HCL 25 MG PO CAPS: 25.0000 mg | ORAL_CAPSULE | Freq: Four times a day (QID) | ORAL | Status: DC | PRN

## 2014-02-07 MED ORDER — GABAPENTIN 400 MG PO CAPS
400.0000 mg | ORAL_CAPSULE | Freq: Three times a day (TID) | ORAL | Status: DC
  Administered 2014-02-07 – 2014-02-09 (×9): 400 mg via ORAL
  Filled 2014-02-07 (×12): qty 1

## 2014-02-07 MED ORDER — CALCIUM CARBONATE ANTACID 500 MG PO CHEW
CHEWABLE_TABLET | ORAL | Status: AC
  Filled 2014-02-07: qty 2

## 2014-02-07 MED ORDER — ACD FORMULA A 0.73-2.45-2.2 GM/100ML VI SOLN: 500.0000 mL | Status: DC

## 2014-02-07 MED ORDER — ACD FORMULA A 0.73-2.45-2.2 GM/100ML VI SOLN
Status: AC
  Administered 2014-02-07: 16:00:00
  Filled 2014-02-07: qty 500

## 2014-02-07 MED ORDER — AMITRIPTYLINE HCL 10 MG PO TABS
10.0000 mg | ORAL_TABLET | Freq: Every day | ORAL | Status: DC
  Administered 2014-02-07 – 2014-02-12 (×6): 10 mg via ORAL
  Filled 2014-02-07 (×7): qty 1

## 2014-02-07 MED ORDER — ACETAMINOPHEN 325 MG PO TABS: 650.0000 mg | ORAL_TABLET | ORAL | Status: DC | PRN

## 2014-02-07 MED ORDER — HEPARIN SODIUM (PORCINE) 1000 UNIT/ML IJ SOLN: 1000.0000 [IU] | Freq: Once | INTRAMUSCULAR | Status: DC

## 2014-02-07 MED ORDER — SODIUM CHLORIDE 0.9 % IV SOLN
4.0000 g | Freq: Once | INTRAVENOUS | Status: AC
  Administered 2014-02-07: 4 g via INTRAVENOUS
  Filled 2014-02-07: qty 40

## 2014-02-07 NOTE — Progress Notes (Signed)
Pt asleep, unable to perform NIF / FVC

## 2014-02-07 NOTE — Progress Notes (Addendum)
Subjective/Complaints: 43 y.o. female with history of depression with anxiety, recent NSTEMI, who was readmitted on 01/16/14 with recurrent chest pain as well as confusion. CTA chest negative for PE and new hepatomegaly and hepatic steatosis noted. UDS positive for marijuana and benzos. Cardiology consulted and felt that symptoms due to psychosocial issues. Patient reported difficulty walking and well as BLE weakness on 07/29 and MRI lumbar spine without disc herniation or stenosis. 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 She was treated with a round of IVIG without much improvement. Patient with stable NIF/VC but with elevated ESR.  HD cath was placed by IVR and she was started on PLEX due to minimal response with IVIG. Confusion has resolved. Kleb UTI treated with cipro and foley placed due to urinary retention. Therapy ongoing and patient continues to have high levels of anxiety as well as fear with mobility.  Didn't sleep very well. Pain an issue as is anxiety during activities with therapy  Objective: Vital Signs: Blood pressure 116/85, pulse 120, temperature 98 F (36.7 C), temperature source Oral, resp. rate 18, height _0  (1.626 m), weight 49.896 kg (110 lb), last menstrual period 04/23/2011, SpO2 100.00%. No results found. Results for orders placed during the hospital encounter of 02/01/14 (from the past 72 hour(s))  BASIC METABOLIC PANEL     Status: Abnormal   Collection Time    02/05/14  9:02 AM      Result Value Ref Range   Sodium 135 (*) 137 - 147 mEq/L   Potassium 4.1  3.7 - 5.3 mEq/L   Chloride 98  96 - 112 mEq/L   CO2 23  19 - 32 mEq/L   Glucose, Bld 94  70 - 99 mg/dL   BUN 10  6 - 23 mg/dL   Creatinine, Ser 0.37 (*) 0.50 - 1.10 mg/dL   Calcium 9.4   8.4 - 10.5 mg/dL   GFR calc non Af Amer >90  >90 mL/min   GFR calc Af Amer >90  >90 mL/min   Comment: (NOTE)     The eGFR has been calculated using the CKD EPI equation.     This calculation has not been validated in all clinical situations.     eGFR's persistently <90 mL/min signify possible Chronic Kidney     Disease.   Anion gap 14  5 - 15  POCT I-STAT, CHEM 8     Status: Abnormal   Collection Time    02/05/14  4:04 PM      Result Value Ref Range   Sodium 132 (*) 137 - 147 mEq/L   Potassium 4.4  3.7 - 5.3 mEq/L   Chloride 96  96 - 112 mEq/L   BUN 11  6 - 23 mg/dL   Creatinine, Ser 0.50  0.50 - 1.10 mg/dL   Glucose, Bld 96  70 - 99 mg/dL   Calcium, Ion 1.27 (*) 1.12 - 1.23 mmol/L   TCO2 25  0 - 100 mmol/L   Hemoglobin 10.5 (*) 12.0 - 15.0 g/dL   HCT 31.0 (*) 36.0 - 46.0 %  TROPONIN I     Status: None   Collection Time    02/05/14  4:06 PM      Result Value Ref Range   Troponin I <0.30  <  0.30 ng/mL   Comment:            Due to the release kinetics of cTnI,     a negative result within the first hours     of the onset of symptoms does not rule out     myocardial infarction with certainty.     If myocardial infarction is still suspected,     repeat the test at appropriate intervals.  CBC     Status: Abnormal   Collection Time    02/05/14  4:07 PM      Result Value Ref Range   WBC 7.2  4.0 - 10.5 K/uL   RBC 2.91 (*) 3.87 - 5.11 MIL/uL   Hemoglobin 9.6 (*) 12.0 - 15.0 g/dL   HCT 28.2 (*) 36.0 - 46.0 %   MCV 96.9  78.0 - 100.0 fL   MCH 33.0  26.0 - 34.0 pg   MCHC 34.0  30.0 - 36.0 g/dL   RDW 15.9 (*) 11.5 - 15.5 %   Platelets 304  150 - 400 K/uL  TROPONIN I     Status: None   Collection Time    02/05/14  9:14 PM      Result Value Ref Range   Troponin I <0.30  <0.30 ng/mL   Comment:            Due to the release kinetics of cTnI,     a negative result within the first hours     of the onset of symptoms does not rule out     myocardial infarction with certainty.      If myocardial infarction is still suspected,     repeat the test at appropriate intervals.  TROPONIN I     Status: None   Collection Time    02/06/14  3:30 AM      Result Value Ref Range   Troponin I <0.30  <0.30 ng/mL   Comment:            Due to the release kinetics of cTnI,     a negative result within the first hours     of the onset of symptoms does not rule out     myocardial infarction with certainty.     If myocardial infarction is still suspected,     repeat the test at appropriate intervals.  GLUCOSE, CAPILLARY     Status: None   Collection Time    02/06/14  6:52 AM      Result Value Ref Range   Glucose-Capillary 93  70 - 99 mg/dL     HEENT: normal Cardio: tachy and no murmur Resp: CTA B/L and unlabored GI: BS positive and NT,ND Extremity:  Pulses positive and No Edema Skin:   Intact 3/5 bilateral deltoids  3 minus/5 bilateral bicep  2 minus/5 bilateral tricep  3 minus/5 left finger flexors  Trace right finger flexors  0/5 bilateral wrist extensors  2 minus/5 right hip flexors  Trace left hip flexors  Trace bilateral knee extensors  2 minus/5 bilateral hip extensors  Trace right total flexion and extension  0/5 left total flexion and extension  Psych: flat, sometimes anxious, speaks in soft voice   Assessment/Plan: 1. 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 Physiatrist is providing close team supervision and 24 hour management of active medical problems listed below. Physiatrist and rehab team continue to assess barriers  to discharge/monitor patient progress toward functional and medical goals.  FIM: FIM - Bathing Bathing Steps Patient Completed: Abdomen;Right Arm;Chest Bathing: 2: Max-Patient completes 3-4 68f10 parts or 25-49% (bed level rol and HOB up)  FIM - Upper Body Dressing/Undressing Upper body dressing/undressing  steps patient completed: Thread/unthread right sleeve of pullover shirt/dresss;Put head through opening of pull over shirt/dress;Pull shirt over trunk (HOB up) Upper body dressing/undressing: 4: Min-Patient completed 75 plus % of tasks FIM - Lower Body Dressing/Undressing Lower body dressing/undressing: 1: Total-Patient completed less than 25% of tasks  FIM - Toileting Toileting: 1: Two helpers (per AJolyne Loa NT report)     FIM - BControl and instrumentation engineerDevices: Sliding board;Arm rests;Bed rails Bed/Chair Transfer: 1: Sit > Supine: Total A (helper does all/Pt. < 25%);1: Bed > Chair or W/C: Total A (helper does all/Pt. < 25%);1: Chair or W/C > Bed: Total A (helper does all/Pt. < 25%) (squat pivot)  FIM - Locomotion: Wheelchair Distance: 150 Locomotion: Wheelchair: 1: Total Assistance/staff pushes wheelchair (Pt<25%) FIM - Locomotion: Ambulation Locomotion: Ambulation: 0: Activity did not occur  Comprehension Comprehension Mode: Auditory Comprehension: 5-Follows basic conversation/direction: With extra time/assistive device  Expression Expression Mode: Verbal Expression: 5-Expresses basic needs/ideas: With extra time/assistive device  Social Interaction Social Interaction: 4-Interacts appropriately 75 - 89% of the time - Needs redirection for appropriate language or to initiate interaction.  Problem Solving Problem Solving: 5-Solves basic 90% of the time/requires cueing < 10% of the time  Memory Memory: 5-Recognizes or recalls 90% of the time/requires cueing < 10% of the time  Medical Problem List and Plan: 1. 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  DVT Prophylaxis/Anticoagulation: Pharmaceutical: Lovenox 3. Neuropathy/Pain Management: Increase Neurontin to 400 mg qid.    -also add LOW dose elavil at HS  4. Bipolar disorder/Mood:  Ego support, neuropsych assessment. klonopin 5. Neuropsych: This patient is capable of making decisions on her own  behalf. 6. Skin/Wound Care: Pressure relief measures to prevent breakdown. [place] protein [place]--added nutritional supplement. 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 Hyponatremia:  Improved--recheck thursday 9. Hypokalemia: supplementing, re-check    10. Neurogenic bladder: continue foley. Has MRSA--- rxing  with macrodantin  -contact precautions 11. Mild anemia: recheck CBC     12. Abnormal LFTs: Hepatitis panel negative.    -follow up  13. CV---imdur and metoprolol held due to orthostasis yesterday-  -HR still elevated due to deconditioning and anxiety  -TEDS/abd binder potentially  -encourage fluids  -a little anemic still but hgb stable   LOS (Days) 6 A FACE TO FACE EVALUATION WAS PERFORMED  Cadarius Nevares T 02/07/2014, 8:12 AM

## 2014-02-07 NOTE — Progress Notes (Signed)
Occupational Therapy Session Notes  Patient Details  Name: Crystal DikeJennifer P Ginsberg MRN: [id number] Date of Birth: 12-Mar-1971  Today's Date: 02/07/2014 OT Individual Time: [id number] and  102-205 OT Individual Time Calculation (min): 63 min and 63  Short Term Goals: Week 1:  OT Short Term Goal 1 (Week 1): Pt will feed self 50% of  meal with setup OT Short Term Goal 2 (Week 1): pt will don shirt with min a  OT Short Term Goal 3 (Week 1): pt will tolerate sitting eob for one adl task with steady assist for 5 min OT Short Term Goal 4 (Week 1): pt will roll in bed with min a with bed rail for LB clothing management  Skilled Therapeutic Interventions/Progress Updates:  1)  Patient resting in bed upon arrival.  Patient wearing right dorsal wrist splint upside down on her RUE and when asked if patient slept in it she could not recall and did not know that it was upside down.  Additional education provided regarding recommendation to only wear during the day and not to sleep in it as well as reviewed how the brace needs to be worn.  Patient agreed that she needed to guide her care and be responsible regarding when to wear the brace and how to wear the brace.  Engaged in self care retraining to include bath dress and groom tasks at a bed level.  Focused session on activity tolerance secondary to quickly fatigues with minimal activity, bed mobility, increased use of BUEs, adaptive techniques to reach BLEs for bath and to donn her shirt.  Patient demonstrating memory issues related to new information learned and the ability to carry over that information from one session to the next in the same day as well as from day to day.  Patient reported feeling very exhausted by the end of the session.  2)   Patient resting in bed upon arrival and very tearful secondary had heard the news that the rehab team has decided that her discharge would be in about 4 weeks and she was concerned that she would miss her court date  regarding custody of her children and "have to go to jail".  RN provided medication to calm her down, support and encouragement was provided by the SW and this OT.  Patient eventually agreeable to participate in therapy.  She reluctantly agreed to get out of bed.  Patient was total assist +1 for bed>w/c via slide board.  Patient performed grooming tasks at sink to include donning a headband and brushing hair with a brush that has been adapted.  By end of session, patient had calmed down and watching TV with all items within reach.  Therapy Documentation Precautions:  Precautions Precautions: Fall Precaution Comments: bilateral ue/le, fear of falling  Restrictions Weight Bearing Restrictions: No Pain: 1)  8/10 muscle ache "from the neck down", premedicated, rest, repositioned, activity 2)  7/10 muscle ache "all over", premedicated, rest, repositioned and activity ADL: See FIM for current functional status  Therapy/Group: Individual Therapy both sessions  Dowell Hoon 02/07/2014, 9:23 AM

## 2014-02-07 NOTE — Progress Notes (Signed)
NIF -50 VC 2.8 Good Effort

## 2014-02-07 NOTE — Progress Notes (Signed)
Physical Therapy Session Note  Patient Details  Name: Crystal Stein MRN: [id number] Date of Birth: [birth year]  Today's Date: 02/07/2014 PT Individual Time: 1510-1540 PT Individual Time Calculation (min): 30 min   Short Term Goals: Week 1:  PT Short Term Goal 1 (Week 1): pt will roll R with min assist after set-up PT Short Term Goal 2 (Week 1): pt will transfer bed > w/c with assist of 1 person consistently PT Short Term Goal 3 (Week 1): pt will tolerate sitting up in w/c x 2 hours at least once per day PT Short Term Goal 4 (Week 1): pt will demonstrate R or L lateral lean x 30 seconds with assistance in order to perform pressure relief in sitting  Skilled Therapeutic Interventions/Progress Updates:    Therapeutic Activity: PT instructs pt in scoot transfer w/c to bed with slideboard req tot A x 2 persons. PT instructs pt in head-hips relationship, but pt is fearful and has difficulty following. Pt's B UEs are extremely weak and unable to effectively push. Pt req 2 person tot A - one at trunk and one for B legs - sit to supine transfer. Pt req mod-max A to roll R/L in bed with rail while PT adjusts pt's shorts for improved comfort.   Therapy session ended early due to plasmaphoresis appointment. PT donned pt's B PRAFO boots and covered her with blanket and sheet for her comfort during the procedure. Pt will benefit from continued PT to maximize functional independence and decrease burden of care.   Therapy Documentation Precautions:  Precautions Precautions: Fall Precaution Comments: bilateral ue/le, fear of falling  Restrictions Weight Bearing Restrictions: No Contact Precautions General: PT Amount of Missed Time (min): 30 Minutes PT Missed Treatment Reason: Other (Comment) (plasmaphoresis appointment in hospital during therapy time) Pain: Pain Assessment Pain Assessment: 0-10 Pain Score: 6  Pain Type: Acute pain Pain Location: Arm Pain Orientation: Right;Left Pain  Descriptors / Indicators: Sore Pain Onset: On-going Pain Intervention(s): Rest;Repositioned Multiple Pain Sites: No     See FIM for current functional status  Therapy/Group: Individual Therapy  Crystal Stein 02/07/2014, 3:45 PM

## 2014-02-07 NOTE — Plan of Care (Signed)
Problem: RH BOWEL ELIMINATION Goal: RH STG MANAGE BOWEL WITH ASSISTANCE STG Manage Bowel with Assistance. Max A  Outcome: Not Progressing Last bm 8/14 Goal: RH STG MANAGE BOWEL W/MEDICATION W/ASSISTANCE STG Manage Bowel with Medication with Assistance.  Outcome: Not Progressing Last BM 8/14  Problem: RH BLADDER ELIMINATION Goal: RH STG MANAGE BLADDER WITH ASSISTANCE STG Manage Bladder With Assistance. Min A  Outcome: Not Progressing Foley cath in place

## 2014-02-07 NOTE — Progress Notes (Signed)
Patient's last bowel movement 02/02/14.  Patient was agreeable to a dulcolax suppository after therapy but after returning from plasmapheresis, patient is very tearful and is refusing a laxative at this time.  Patient was educated on the importance of bowel movements and bowel function; patient verbalizes understanding.  Will continue to monitor.

## 2014-02-08 ENCOUNTER — Inpatient Hospital Stay (HOSPITAL_COMMUNITY): Payer: Medicaid Other | Admitting: Occupational Therapy

## 2014-02-08 ENCOUNTER — Inpatient Hospital Stay (HOSPITAL_COMMUNITY): Payer: Medicaid Other

## 2014-02-08 ENCOUNTER — Inpatient Hospital Stay (HOSPITAL_COMMUNITY): Payer: Medicaid Other | Admitting: *Deleted

## 2014-02-08 LAB — BASIC METABOLIC PANEL
Anion gap: 13 (ref 5–15)
BUN: 8 mg/dL (ref 6–23)
CO2: 18 mEq/L — ABNORMAL LOW (ref 19–32)
CREATININE: 0.29 mg/dL — AB (ref 0.50–1.10)
Calcium: 9.4 mg/dL (ref 8.4–10.5)
Chloride: 105 mEq/L (ref 96–112)
GFR calc Af Amer: >90 mL/min (ref >90)
GFR calc non Af Amer: >90 mL/min (ref >90)
GLUCOSE: 88 mg/dL (ref 70–99)
Potassium: 3.6 mEq/L — ABNORMAL LOW (ref 3.7–5.3)
Sodium: 136 mEq/L — ABNORMAL LOW (ref 137–147)

## 2014-02-08 NOTE — Progress Notes (Signed)
Social Work Patient ID: Crystal Stein, female   DOB: Apr 27, 1971, 43 y.o.   MRN: [id number]  Spoke with pt's mother yesterday following team conference and with pt this morning to review d/c planning concerns.  Have explained to both that target LOS was set for 4 weeks, however, goals at minimal assist and may need to be downgraded.  Mother very distressed by her own limitations and inability to provide care to pt.  She notes that there are no family members in the home who could provide any level of physical assistance due to their own poor health.  Mother notes that, even prior to hospital admission, pt was "crawling around on the floor because nobody could get her up."   Have discussed these concerns and limitations with pt who confirms that she is well aware that family could not provide physical assistance to her.  We had a very frank conversation about only appropriate option for d/c would be to transition to SNF.  Pt briefly tearful and then calms and reports that she understands and is in agreement with this d/c plan.  Have explained to her and to family that I will need to discuss further with tx team the timing of her "readiness" to transfer and what gains we could make to help her transition.  (tx team aware that the amount of therapy that pt will receive in SNF will be very, very limited if at all.) Will continue to follow for d/c planning and support needs.  Brentney Goldbach, LCSW

## 2014-02-08 NOTE — Progress Notes (Signed)
Occupational Therapy Session Notes  Patient Details  Name: Crystal Stein MRN: [id number] Date of Birth: [birth year]  Today's Date: 02/08/2014 OT Individual Time: 1004-1104 and 100-200 OT Individual Time Calculation (min): 60 min and 60 min   Short Term Goals: Week 1:  OT Short Term Goal 1 (Week 1): Pt will feed self 50% of  meal with setup OT Short Term Goal 2 (Week 1): pt will don shirt with min a  OT Short Term Goal 3 (Week 1): pt will tolerate sitting eob for one adl task with steady assist for 5 min OT Short Term Goal 4 (Week 1): pt will roll in bed with min a with bed rail for LB clothing management  Skilled Therapeutic Interventions/Progress Updates:  1)  Patient sleeping in bed upon arrival.  Engaged in self care retraining to include bath dress and groom tasks at a bed level dues to quickly fatigues. Focused session on activity tolerance secondary to quickly fatigues with minimal activity, bed mobility, increased use of BUEs, adaptive techniques to reach BLEs for bath and to donn her shirt and use of bath mitt to improve independence. Patient continues to demonstratememory issues related to new information learned and the ability to carry over that information from one session to the next in the same day as well as from day to day. Patient continues to report feeling very exhausted by the end of the session.   2)  Patient sleeping in bed upon arrival and very sleepy in beginning of session stating that she does not sleep well at night.  Suspect pain meds contribute to sleepiness secondary to this afternoon is first time patient has indicated a 3/10 pain score.  She usually reports 7-10/10.  Engaged in bed mobility to place pad under her bottom to prepare for side board transfer.  Sat EOB with supervision for ~15 min while performing grooming tasks and talking on the phone.  Total assist with slide board transfer with report of feeling dizzy once in w/c.  Denies dizzines after tilt w/c  back ~1 min. Discussed use of the drop arm commode for toileting and shower equipment to be practiced in the near future. Engaged in washing hair with no rinse shower cap then patient charged with remembering to ask staff to tilt her w/c back for pressure relief every 20-30 min.  RT & NT notified as well.  Patient continues to display poor memory   Therapy Documentation Precautions:  Precautions Precautions: Fall Precaution Comments: bilateral ue/le, fear of falling  Restrictions Weight Bearing Restrictions: No Pain: 1)  7/10 pain, "all over", premedicated, rest, repositioned and activity 2)  3/10 pain, "all over", premedicated, rest, repositioned and activity ADL: See FIM for current functional status  Therapy/Group: Individual Therapy both sessions  Crystal Stein 02/08/2014, 8:31 AM

## 2014-02-08 NOTE — Progress Notes (Signed)
Pt stated she was upset so results were not as good as the last time.  NIF -48 and VC was 1.9L.  Good effort

## 2014-02-08 NOTE — Plan of Care (Signed)
Problem: RH BOWEL ELIMINATION Goal: RH STG MANAGE BOWEL WITH ASSISTANCE STG Manage Bowel with Assistance. Max A  Outcome: Progressing Had large BM total assist/ suppository and digital stim  Problem: RH BLADDER ELIMINATION Goal: RH STG MANAGE BLADDER WITH ASSISTANCE STG Manage Bladder With Assistance. Min A  Outcome: Not Progressing Foley cath in place d/t neurogenic bladder

## 2014-02-08 NOTE — Progress Notes (Signed)
Patient has complaint of discomfort trying to move bowels. Provided suppository. Will continue to monitor.  Diamantina MonksARPENTER,Carolee Channell, RN

## 2014-02-08 NOTE — Progress Notes (Signed)
Recreational Therapy Session Note  Patient Details  Name: Crystal Stein MRN: [id number] Date of Birth: [birth year] Today's Date: 02/08/2014  LATE ENTRY from 02/07/2014 Pain: no c/o Skilled Therapeutic Interventions/Progress Updates: Session focused on discussion of relaxation techniques to assist with anxiety management.  Pt identified activities that increased anxiety, specifically PT as she has a fear of falling with movement.  Discussed use of diaphragmatic breathing, progressive muscle relaxation & guided imagery to assist with relief.  Plan to offer relaxation training session prior to scheduled PT sessions in the future PRN.  PT made aware.  Therapy/Group: Individual Therapy  [place] 02/08/2014, 8:11 AM

## 2014-02-08 NOTE — Progress Notes (Signed)
Physical Therapy Session Note  Patient Details  Name: Crystal DikeJennifer P Stein MRN: [id number] Date of Birth: 1971-01-25  Today's Date: 02/08/2014 PT Individual Time: 1500-1605 PT Individual Time Calculation (min): 65 min   Short Term Goals: Week 1:  PT Short Term Goal 1 (Week 1): pt will roll R with min assist after set-up PT Short Term Goal 2 (Week 1): pt will transfer bed > w/c with assist of 1 person consistently PT Short Term Goal 3 (Week 1): pt will tolerate sitting up in w/c x 2 hours at least once per day PT Short Term Goal 4 (Week 1): pt will demonstrate R or L lateral lean x 30 seconds with assistance in order to perform pressure relief in sitting  Skilled Therapeutic Interventions/Progress Updates:  Patient sitting in wheelchair upon entering room. Patient pushed in wheelchair to gym. Patient reported dizziness when sat upright in wheelchair (no tilt). Bp taken as above. Patient transferred wheelchair to mat using sliding board and +2 max assist for safety. Patient worked on sitting edge of mat about 2 minutes before complaining of being lightheaded again. Patient sit to supine with max assist for lifting LE's and trunk control for descent. Patient performed bilateral LE active assistive and passive stretching for ROM and strengthening x 10 - 20 reps each of: hip flexion, hip ab/adduction, SAQ's, eccentric quad sets, hip IR/ER, ankle dorsiflexion/plantarflexion, and bridging. Patient demonstrates initiation of movement in all planes and can take slight manual resistance in gravity eliminated positions. Patient max assist for supine to sit and transferring with sliding board into wheelchair and wheelchair to bed once back in room. Patient was able to assist by pushing with LE's some with transfer. Patient was left resting in bed with all items in reach.  Therapy Documentation Precautions:  Precautions Precautions: Fall Precaution Comments: bilateral ue/le, fear of falling   Restrictions Weight Bearing Restrictions: No  Vital Signs: BP: 94/66 mmHg Patient Position (if appropriate): Sitting in wheelchair Patient reported feeling dizzy. "may be due to anxiety."  Pain: Pain Assessment Pain Assessment: 0-10 Pain Score: 8  Pain Location: Leg Pain Descriptors / Indicators: Aching  See FIM for current functional status  Therapy/Group: Individual Therapy  Arelia LongestWindsor, Renezmae Canlas M 02/08/2014, 4:14 PM

## 2014-02-08 NOTE — Progress Notes (Signed)
Patient continue to have complaint of difficulty passing stool, after digital stim, patient relieved bowels, large stool.  Diamantina MonksARPENTER,Danilynn Jemison, RN

## 2014-02-08 NOTE — Progress Notes (Signed)
Subjective/Complaints: 43 y.o. female with history of depression with anxiety, recent NSTEMI, who was readmitted on 01/16/14 with recurrent chest pain as well as confusion. CTA chest negative for PE and new hepatomegaly and hepatic steatosis noted. UDS positive for marijuana and benzos. Cardiology consulted and felt that symptoms due to psychosocial issues. Patient reported difficulty walking and well as BLE weakness on 07/29 and MRI lumbar spine without disc herniation or stenosis. 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 She was treated with a round of IVIG without much improvement. Patient with stable NIF/VC but with elevated ESR.  HD cath was placed by IVR and she was started on PLEX due to minimal response with IVIG. Confusion has resolved. Kleb UTI treated with cipro and foley placed due to urinary retention. Therapy ongoing and patient continues to have high levels of anxiety as well as fear with mobility.  Slept a little better. Pain and anxiety still problematic  Objective: Vital Signs: Blood pressure 103/70, pulse 125, temperature 98.8 F (37.1 C), temperature source Oral, resp. rate 18, height '5\' 4"'  (1.626 m), weight 49.896 kg (110 lb), last menstrual period 04/23/2011, SpO2 100.00%. No results found. Results for orders placed during the hospital encounter of 02/01/14 (from the past 72 hour(s))  BASIC METABOLIC PANEL     Status: Abnormal   Collection Time    02/05/14  9:02 AM      Result Value Ref Range   Sodium 135 (*) 137 - 147 mEq/L   Potassium 4.1  3.7 - 5.3 mEq/L   Chloride 98  96 - 112 mEq/L   CO2 23  19 - 32 mEq/L   Glucose, Bld 94  70 - 99 mg/dL   BUN 10  6 - 23 mg/dL   Creatinine, Ser 0.37 (*) 0.50 - 1.10 mg/dL   Calcium 9.4  8.4 - 10.5 mg/dL   GFR  calc non Af Amer >90  >90 mL/min   GFR calc Af Amer >90  >90 mL/min   Comment: (NOTE)     The eGFR has been calculated using the CKD EPI equation.     This calculation has not been validated in all clinical situations.     eGFR's persistently <90 mL/min signify possible Chronic Kidney     Disease.   Anion gap 14  5 - 15  POCT I-STAT, CHEM 8     Status: Abnormal   Collection Time    02/05/14  4:04 PM      Result Value Ref Range   Sodium 132 (*) 137 - 147 mEq/L   Potassium 4.4  3.7 - 5.3 mEq/L   Chloride 96  96 - 112 mEq/L   BUN 11  6 - 23 mg/dL   Creatinine, Ser 0.50  0.50 - 1.10 mg/dL   Glucose, Bld 96  70 - 99 mg/dL   Calcium, Ion 1.27 (*) 1.12 - 1.23 mmol/L   TCO2 25  0 - 100 mmol/L   Hemoglobin 10.5 (*) 12.0 - 15.0 g/dL   HCT 31.0 (*) 36.0 - 46.0 %  TROPONIN I     Status: None   Collection Time    02/05/14  4:06 PM      Result Value Ref Range   Troponin I <0.30  <0.30 ng/mL   Comment:  Due to the release kinetics of cTnI,     a negative result within the first hours     of the onset of symptoms does not rule out     myocardial infarction with certainty.     If myocardial infarction is still suspected,     repeat the test at appropriate intervals.  CBC     Status: Abnormal   Collection Time    02/05/14  4:07 PM      Result Value Ref Range   WBC 7.2  4.0 - 10.5 K/uL   RBC 2.91 (*) 3.87 - 5.11 MIL/uL   Hemoglobin 9.6 (*) 12.0 - 15.0 g/dL   HCT 28.2 (*) 36.0 - 46.0 %   MCV 96.9  78.0 - 100.0 fL   MCH 33.0  26.0 - 34.0 pg   MCHC 34.0  30.0 - 36.0 g/dL   RDW 15.9 (*) 11.5 - 15.5 %   Platelets 304  150 - 400 K/uL  TROPONIN I     Status: None   Collection Time    02/05/14  9:14 PM      Result Value Ref Range   Troponin I <0.30  <0.30 ng/mL   Comment:            Due to the release kinetics of cTnI,     a negative result within the first hours     of the onset of symptoms does not rule out     myocardial infarction with certainty.     If myocardial  infarction is still suspected,     repeat the test at appropriate intervals.  TROPONIN I     Status: None   Collection Time    02/06/14  3:30 AM      Result Value Ref Range   Troponin I <0.30  <0.30 ng/mL   Comment:            Due to the release kinetics of cTnI,     a negative result within the first hours     of the onset of symptoms does not rule out     myocardial infarction with certainty.     If myocardial infarction is still suspected,     repeat the test at appropriate intervals.  GLUCOSE, CAPILLARY     Status: None   Collection Time    02/06/14  6:52 AM      Result Value Ref Range   Glucose-Capillary 93  70 - 99 mg/dL  POCT I-STAT, CHEM 8     Status: Abnormal   Collection Time    02/07/14  3:58 PM      Result Value Ref Range   Sodium 134 (*) 137 - 147 mEq/L   Potassium 3.9  3.7 - 5.3 mEq/L   Chloride 99  96 - 112 mEq/L   BUN 11  6 - 23 mg/dL   Creatinine, Ser 0.30 (*) 0.50 - 1.10 mg/dL   Glucose, Bld 99  70 - 99 mg/dL   Calcium, Ion 1.37 (*) 1.12 - 1.23 mmol/L   TCO2 21  0 - 100 mmol/L   Hemoglobin 10.5 (*) 12.0 - 15.0 g/dL   HCT 31.0 (*) 36.0 - 46.0 %  CBC     Status: Abnormal   Collection Time    02/07/14  4:00 PM      Result Value Ref Range   WBC 5.9  4.0 - 10.5 K/uL   RBC 2.90 (*) 3.87 - 5.11 MIL/uL   Hemoglobin  9.7 (*) 12.0 - 15.0 g/dL   HCT 28.0 (*) 36.0 - 46.0 %   MCV 96.6  78.0 - 100.0 fL   MCH 33.4  26.0 - 34.0 pg   MCHC 34.6  30.0 - 36.0 g/dL   RDW 15.5  11.5 - 15.5 %   Platelets 308  150 - 400 K/uL  RETICULOCYTES     Status: Abnormal   Collection Time    02/07/14  4:00 PM      Result Value Ref Range   Retic Ct Pct 4.9 (*) 0.4 - 3.1 %   RBC. 2.90 (*) 3.87 - 5.11 MIL/uL   Retic Count, Manual 142.1  19.0 - 186.0 K/uL     HEENT: normal Cardio: tachy and no murmur Resp: CTA B/L and unlabored GI: BS positive and NT,ND Extremity:  Pulses positive and No Edema Skin:   Intact 3/5 bilateral deltoids  3 minus/5 bilateral bicep  2 minus/5  bilateral tricep  3 minus/5 left finger flexors  Trace right finger flexors  0/5 bilateral wrist extensors  2 minus/5 right hip flexors  Trace to 1 left hip flexors  Trace to 1 bilateral knee extensors  2- to 2+/5 bilateral hip extensors  Trace right total flexion and extension  1/5 left total flexion and extension  Psych: flat, sometimes anxious, speaks in soft voice   Assessment/Plan: 1. 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 Physiatrist is providing close team supervision and 24 hour management of active medical problems listed below. Physiatrist and rehab team continue to assess barriers to discharge/monitor patient progress toward functional and medical goals.  FIM: FIM - Bathing Bathing Steps Patient Completed: Abdomen;Right Arm;Chest;Left Arm Bathing: 2: Max-Patient completes 3-4 48f10 parts or 25-49%  FIM - Upper Body Dressing/Undressing Upper body dressing/undressing steps patient completed: Thread/unthread right bra strap;Thread/unthread left bra strap;Thread/unthread right sleeve of pullover shirt/dresss;Thread/unthread left sleeve of pullover shirt/dress;Put head through opening of pull over shirt/dress Upper body dressing/undressing: 4: Min-Patient completed 75 plus % of tasks (HOB up and lean forward) FIM - Lower Body Dressing/Undressing Lower body dressing/undressing: 1: Total-Patient completed less than 25% of tasks  FIM - Toileting Toileting: 1: Two helpers (per AJolyne Loa NT report)     FIM - BControl and instrumentation engineerDevices: Sliding board;Arm rests Bed/Chair Transfer: 1: Sit > Supine: Total A (helper does all/Pt. < 25%);1: Chair or W/C > Bed: Total A (helper does all/Pt. < 25%);1: Two helpers  FIM - Locomotion: Wheelchair Distance: 150 Locomotion: Wheelchair: 1: Total Assistance/staff pushes wheelchair  (Pt<25%) FIM - Locomotion: Ambulation Locomotion: Ambulation: 0: Activity did not occur  Comprehension Comprehension Mode: Auditory Comprehension: 5-Follows basic conversation/direction: With no assist  Expression Expression Mode: Verbal Expression: 6-Expresses complex ideas: With extra time/assistive device  Social Interaction Social Interaction: 4-Interacts appropriately 75 - 89% of the time - Needs redirection for appropriate language or to initiate interaction.  Problem Solving Problem Solving: 5-Solves basic 90% of the time/requires cueing < 10% of the time  Memory Memory: 5-Recognizes or recalls 90% of the time/requires cueing < 10% of the time  Medical Problem List and Plan: 1. 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  DVT Prophylaxis/Anticoagulation: Pharmaceutical: Lovenox 3. Neuropathy/Pain Management: Increase Neurontin to 400 mg qid.    -added LOW dose elavil at HS   -watch for neurosedation 4. Bipolar disorder/Mood:  Ego support, neuropsych assessment. klonopin 5. Neuropsych: This patient is capable  of making decisions on her own behalf. 6. Skin/Wound Care: Pressure relief measures to prevent breakdown. Encourage boosting when [place]. Has very low protein stores--added nutritional supplement. 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 Hyponatremia:  Improved-- 134 9. Hypokalemia: supplementing, re-check    10. Neurogenic bladder: continue foley. Has MRSA--- rxing  with macrodantin  -discuss voiding trial with RN  -contact precautions 11. Mild anemia: recheck CBC     12. Abnormal LFTs: Hepatitis panel negative.    -follow up  13. CV---imdur and metoprolol held due to orthostasis yesterday-  -HR still elevated due to deconditioning and anxiety  -TEDS/abd binder potentially  -encourage fluids  -a little anemic still but hgb stable   LOS (Days) 7 A FACE TO FACE EVALUATION WAS PERFORMED  Darek Eifler T 02/08/2014, 7:59 AM

## 2014-02-09 ENCOUNTER — Inpatient Hospital Stay (HOSPITAL_COMMUNITY): Payer: Medicaid Other | Admitting: Occupational Therapy

## 2014-02-09 ENCOUNTER — Inpatient Hospital Stay (HOSPITAL_COMMUNITY): Payer: Medicaid Other | Admitting: Physical Therapy

## 2014-02-09 ENCOUNTER — Inpatient Hospital Stay (HOSPITAL_COMMUNITY): Payer: Medicaid Other | Admitting: *Deleted

## 2014-02-09 LAB — POCT I-STAT, CHEM 8
BUN: 8 mg/dL (ref 6–23)
Calcium, Ion: 1.21 mmol/L (ref 1.12–1.23)
Chloride: 109 mEq/L (ref 96–112)
Creatinine, Ser: 0.3 mg/dL — ABNORMAL LOW (ref 0.50–1.10)
Glucose, Bld: 96 mg/dL (ref 70–99)
HCT: 25 % — ABNORMAL LOW (ref 36.0–46.0)
Hemoglobin: 8.5 g/dL — ABNORMAL LOW (ref 12.0–15.0)
POTASSIUM: 3.5 meq/L — AB (ref 3.7–5.3)
SODIUM: 129 meq/L — AB (ref 137–147)
TCO2: 21 mmol/L (ref 0–100)

## 2014-02-09 LAB — CBC
HEMATOCRIT: 26.2 % — AB (ref 36.0–46.0)
HEMOGLOBIN: 8.9 g/dL — AB (ref 12.0–15.0)
MCH: 33.6 pg (ref 26.0–34.0)
MCHC: 34 g/dL (ref 30.0–36.0)
MCV: 98.9 fL (ref 78.0–100.0)
Platelets: 288 10*3/uL (ref 150–400)
RBC: 2.65 MIL/uL — AB (ref 3.87–5.11)
RDW: 15.1 % (ref 11.5–15.5)
WBC: 5.8 10*3/uL (ref 4.0–10.5)

## 2014-02-09 LAB — RETICULOCYTES
RBC.: 2.65 MIL/uL — ABNORMAL LOW (ref 3.87–5.11)
Retic Count, Absolute: 108.7 10*3/uL (ref 19.0–186.0)
Retic Ct Pct: 4.1 % — ABNORMAL HIGH (ref 0.4–3.1)

## 2014-02-09 MED ORDER — ACETAMINOPHEN 325 MG PO TABS
650.0000 mg | ORAL_TABLET | ORAL | Status: DC | PRN
  Administered 2014-02-10: 650 mg via ORAL
  Filled 2014-02-09: qty 2

## 2014-02-09 MED ORDER — DIPHENHYDRAMINE HCL 25 MG PO CAPS
25.0000 mg | ORAL_CAPSULE | Freq: Four times a day (QID) | ORAL | Status: DC | PRN
  Administered 2014-02-16: 25 mg via ORAL
  Filled 2014-02-09: qty 1

## 2014-02-09 MED ORDER — OXYCODONE HCL 5 MG PO TABS
ORAL_TABLET | ORAL | Status: AC
  Administered 2014-02-09: 10 mg via ORAL
  Filled 2014-02-09: qty 2

## 2014-02-09 MED ORDER — HEPARIN SODIUM (PORCINE) 1000 UNIT/ML IJ SOLN
1000.0000 [IU] | Freq: Once | INTRAMUSCULAR | Status: AC
  Administered 2014-02-09: 1000 [IU]
  Filled 2014-02-09: qty 1

## 2014-02-09 MED ORDER — SODIUM CHLORIDE 0.9 % IV SOLN
4.0000 g | Freq: Once | INTRAVENOUS | Status: AC
  Administered 2014-02-09: 4 g via INTRAVENOUS
  Filled 2014-02-09: qty 40

## 2014-02-09 MED ORDER — ENSURE COMPLETE PO LIQD
237.0000 mL | Freq: Three times a day (TID) | ORAL | Status: DC
  Administered 2014-02-09 – 2014-02-21 (×28): 237 mL via ORAL

## 2014-02-09 MED ORDER — GABAPENTIN 400 MG PO CAPS
400.0000 mg | ORAL_CAPSULE | Freq: Three times a day (TID) | ORAL | Status: DC
  Administered 2014-02-10: 400 mg via ORAL
  Filled 2014-02-09 (×4): qty 1

## 2014-02-09 MED ORDER — ACD FORMULA A 0.73-2.45-2.2 GM/100ML VI SOLN
500.0000 mL | Status: DC
  Administered 2014-02-09: 500 mL via INTRAVENOUS
  Filled 2014-02-09 (×5): qty 500

## 2014-02-09 MED ORDER — CALCIUM GLUCONATE 10 % IV SOLN
2.0000 g | Freq: Once | INTRAVENOUS | Status: DC
  Filled 2014-02-09: qty 20

## 2014-02-09 MED ORDER — ACD FORMULA A 0.73-2.45-2.2 GM/100ML VI SOLN
Status: AC
  Filled 2014-02-09: qty 500

## 2014-02-09 MED ORDER — SODIUM CHLORIDE 0.9 % IV SOLN
INTRAVENOUS | Status: AC
  Administered 2014-02-09 (×3): via INTRAVENOUS_CENTRAL
  Filled 2014-02-09 (×3): qty 200

## 2014-02-09 NOTE — Progress Notes (Signed)
Subjective/Complaints: 43 y.o. female with history of depression with anxiety, recent NSTEMI, who was readmitted on 01/16/14 with recurrent chest pain as well as confusion. CTA chest negative for PE and new hepatomegaly and hepatic steatosis noted. UDS positive for marijuana and benzos. Cardiology consulted and felt that symptoms due to psychosocial issues. Patient reported difficulty walking and well as BLE weakness on 07/29 and MRI lumbar spine without disc herniation or stenosis. 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 She was treated with a round of IVIG without much improvement. Patient with stable NIF/VC but with elevated ESR.  HD cath was placed by IVR and she was started on PLEX due to minimal response with IVIG. Confusion has resolved. Kleb UTI treated with cipro and foley placed due to urinary retention. Therapy ongoing and patient continues to have high levels of anxiety as well as fear with mobility.  Nerve pain is better (still numb). Right shoulder giving her problems. Heating pad helps somewhat.  Objective: Vital Signs: Blood pressure 116/84, pulse 144, temperature 97.8 F (36.6 C), temperature source Oral, resp. rate 18, height _0  (1.626 m), weight 54.296 kg (119 lb 11.2 oz), last menstrual period 04/23/2011, SpO2 99.00%. No results found. Results for orders placed during the hospital encounter of 02/01/14 (from the past 72 hour(s))  POCT I-STAT, CHEM 8     Status: Abnormal   Collection Time    02/07/14  3:58 PM      Result Value Ref Range   Sodium 134 (*) 137 - 147 mEq/L   Potassium 3.9  3.7 - 5.3 mEq/L   Chloride 99  96 - 112 mEq/L   BUN 11  6 - 23 mg/dL   Creatinine, Ser 0.30 (*) 0.50 - 1.10 mg/dL   Glucose, Bld 99  70 - 99 mg/dL   Calcium, Ion  1.37 (*) 1.12 - 1.23 mmol/L   TCO2 21  0 - 100 mmol/L   Hemoglobin 10.5 (*) 12.0 - 15.0 g/dL   HCT 31.0 (*) 36.0 - 46.0 %  CBC     Status: Abnormal   Collection Time    02/07/14  4:00 PM      Result Value Ref Range   WBC 5.9  4.0 - 10.5 K/uL   RBC 2.90 (*) 3.87 - 5.11 MIL/uL   Hemoglobin 9.7 (*) 12.0 - 15.0 g/dL   HCT 28.0 (*) 36.0 - 46.0 %   MCV 96.6  78.0 - 100.0 fL   MCH 33.4  26.0 - 34.0 pg   MCHC 34.6  30.0 - 36.0 g/dL   RDW 15.5  11.5 - 15.5 %   Platelets 308  150 - 400 K/uL  RETICULOCYTES     Status: Abnormal   Collection Time    02/07/14  4:00 PM      Result Value Ref Range   Retic Ct Pct 4.9 (*) 0.4 - 3.1 %   RBC. 2.90 (*) 3.87 - 5.11 MIL/uL   Retic Count, Manual 142.1  19.0 - 186.0 K/uL  BASIC METABOLIC PANEL     Status: Abnormal   Collection Time    02/08/14  6:52 AM      Result Value Ref Range   Sodium 136 (*) 137 - 147 mEq/L   Potassium 3.6 (*) 3.7 - 5.3  mEq/L   Chloride 105  96 - 112 mEq/L   CO2 18 (*) 19 - 32 mEq/L   Glucose, Bld 88  70 - 99 mg/dL   BUN 8  6 - 23 mg/dL   Creatinine, Ser 0.29 (*) 0.50 - 1.10 mg/dL   Calcium 9.4  8.4 - 10.5 mg/dL   GFR calc non Af Amer >90  >90 mL/min   GFR calc Af Amer >90  >90 mL/min   Comment: (NOTE)     The eGFR has been calculated using the CKD EPI equation.     This calculation has not been validated in all clinical situations.     eGFR's persistently <90 mL/min signify possible Chronic Kidney     Disease.   Anion gap 13  5 - 15     HEENT: normal Cardio: tachy and no murmur Resp: CTA B/L and unlabored GI: BS positive and NT,ND Extremity:  Pulses positive and No Edema Skin:   Intact Neuro: 3/5 bilateral deltoids  3 minus/5 bilateral bicep  2 minus/5 bilateral tricep  3 minus/5 left finger flexors  Trace grip and wrist extension bilaterally   2 minus/5 right hip flexors   1 left hip flexors   1 bilateral knee extensors  2- to 2+/5 bilateral hip extensors  Trace movement at ankles Decreased sense to  PP/LT stocking glove bilateral UE and LE's Psych: flat, sometimes anxious, speaks in soft voice M/S: right shoulder tender with IR/ER---no obvious TP's or spasm appreciated   Assessment/Plan: 1. 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 Physiatrist is providing close team supervision and 24 hour management of active medical problems listed below. Physiatrist and rehab team continue to assess barriers to discharge/monitor patient progress toward functional and medical goals.  FIM: FIM - Bathing Bathing Steps Patient Completed: Abdomen;Right Arm;Chest;Left Arm Bathing: 2: Max-Patient completes 3-4 38f10 parts or 25-49%  FIM - Upper Body Dressing/Undressing Upper body dressing/undressing steps patient completed: Thread/unthread right bra strap;Thread/unthread left bra strap;Thread/unthread right sleeve of pullover shirt/dresss;Thread/unthread left sleeve of pullover shirt/dress;Put head through opening of pull over shirt/dress Upper body dressing/undressing: 4: Min-Patient completed 75 plus % of tasks (HOB up and lean forward) FIM - Lower Body Dressing/Undressing Lower body dressing/undressing: 1: Total-Patient completed less than 25% of tasks  FIM - Toileting Toileting: 1: Two helpers (per AJolyne Loa NT report)     FIM - BControl and instrumentation engineerDevices: Sliding board Bed/Chair Transfer: 1: Supine > Sit: Total A (helper does all/Pt. < 25%);1: Sit > Supine: Total A (helper does all/Pt. < 25%);1: Bed > Chair or W/C: Total A (helper does all/Pt. < 25%);1: Chair or W/C > Bed: Total A (helper does all/Pt. < 25%);1: Two helpers  FIM - Locomotion: Wheelchair Distance: 150 Locomotion: Wheelchair: 1: Total Assistance/staff pushes wheelchair (Pt<25%) FIM - Locomotion: Ambulation Locomotion: Ambulation: 0: Activity did not  occur  Comprehension Comprehension Mode: Auditory Comprehension: 5-Follows basic conversation/direction: With extra time/assistive device  Expression Expression Mode: Verbal Expression: 5-Expresses basic needs/ideas: With extra time/assistive device  Social Interaction Social Interaction: 5-Interacts appropriately 90% of the time - Needs monitoring or encouragement for participation or interaction.  Problem Solving Problem Solving: 5-Solves complex 90% of the time/cues < 10% of the time  Memory Memory: 5-Recognizes or recalls 90% of the time/requires cueing < 10% of the time  Medical Problem List and Plan: 1. 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  DVT Prophylaxis/Anticoagulation: Pharmaceutical: Lovenox 3. Neuropathy/Pain Management: Increase Neurontin to 400 mg qid.    -added LOW dose elavil at HS which seems to have helped  -ordered Kpad for right shoulder 4. Bipolar disorder/Mood:  Ego support, neuropsych assessment. klonopin 5. Neuropsych: This patient is capable of making decisions on her own behalf. 6. Skin/Wound Care: Pressure relief measures to prevent breakdown. Encourage boosting when [place]. Has very low protein stores--added nutritional supplement. 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 Hyponatremia:  Improved-- 134 9. Hypokalemia: supplementing, re-check    10. Neurogenic bladder: continue foley. Has MRSA--- rxing  with macrodantin  -discuss voiding trial with RN  -contact precautions 11. Mild anemia: recheck CBC     12. Abnormal LFTs: Hepatitis panel negative.    -follow up  13. CV---imdur and metoprolol held due to orthostasis yesterday-  -HR still elevated due to deconditioning and anxiety  -TEDS/abd binder potentially  -encourage fluids  -a little anemic still but hgb stable   LOS (Days) 8 A FACE TO FACE EVALUATION WAS PERFORMED  Holdyn Poyser T 02/09/2014, 8:16 AM

## 2014-02-09 NOTE — Progress Notes (Signed)
Note and chart reviewed. Agree. One correction- pt is being provided Ensure Complete TID, not Ensure Pudding.  Ian Malkineanne Barnett RD, LDN Inpatient Clinical Dietitian Pager: [phone] After Hours Pager: [phone]

## 2014-02-09 NOTE — Progress Notes (Signed)
Notified [name] PAc taht HD staff noted pt appeared different today than Wednesdaly. More memory issues and fatigue. Pt noted increased pain and numbness, tingling of hands and incoordination appears to be getting worse. Reviewed information with Pam; see new orders. Crystal Stein, Kyreese Chio B

## 2014-02-09 NOTE — Progress Notes (Signed)
Physical Therapy Session Note  Patient Details  Name: Crystal DikeJennifer P Horgan MRN: [id number] Date of Birth: 05-Oct-1970  Today's Date: 02/09/2014 PT Individual Time: 0930-1015 PT Individual Time Calculation (min): 45 min   Short Term Goals: Week 1:  PT Short Term Goal 1 (Week 1): pt will roll R with min assist after set-up PT Short Term Goal 2 (Week 1): pt will transfer bed > w/c with assist of 1 person consistently PT Short Term Goal 3 (Week 1): pt will tolerate sitting up in w/c x 2 hours at least once per day PT Short Term Goal 4 (Week 1): pt will demonstrate R or L lateral lean x 30 seconds with assistance in order to perform pressure relief in sitting  Skilled Therapeutic Interventions/Progress Updates:    Therapeutic Activity: PT instructs pt in rolling R and L with bedrail req max-tot A due to weakness. PT and Aide place shoes on pt for sitting stability. Pt req 2 person tot A for L side lie to sit transfer. Once sitting edge of bed, pt req initial min A static sit balance then progressing to CGA static sit balance with foot support. Pt's BP drops sitting edge of bed, but pt is able to participate in scoot transfer to R from bed to w/c with slideboard req tot A and verbal cues for head hips relationship. Pt req 2 person assist to scoot bottom back in chair once up. Pt tilted in w/c and BP raises slightly. Pt very painful and anxious this morning - deep breathing used to calm pt down. PT then instructs pt in scoot transfer back to bed with slideboard using same cues and assist as before. Pt req 2 person assist for sit to supine transfer and PRAFO boots donned once in bed. All needs in reach.   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 Pt assisted back to bed and PRAFOs placed on both feet.   Therapy Documentation Precautions:  Precautions Precautions: Fall Precaution Comments: bilateral ue/le, fear of falling   Restrictions Weight Bearing Restrictions: No    Vital Signs: Therapy Vitals Temp: 98.3 F (36.8 C) Temp src: Oral Pulse Rate: 80 Resp: 18 BP: 99/74 mmHg Patient Position (if appropriate): Lying Oxygen Therapy SpO2: 99 % O2 Device: None (Room air) Pain: Pain Assessment Pain Assessment: 0-10 Pain Score: 4  Pain Type: Neuropathic pain Pain Location: Leg Pain Orientation: Left;Right Pain Descriptors / Indicators: Burning Pain Onset: On-going Pain Intervention(s): Rest;Repositioned Multiple Pain Sites: No   See FIM for current functional status  Therapy/Group: Individual Therapy  Alvah Lagrow M 02/09/2014, 9:42 AM

## 2014-02-09 NOTE — Progress Notes (Signed)
Occupational Therapy Weekly Progress Note And Therapy Session Notes  Patient Details  Name: Crystal Stein MRN: [id number] Date of Birth: June 15, 1971  Beginning of progress report period: February 02, 2014 End of progress report period: February 09, 2014  Today's Date: 02/09/2014 OT Individual Time: 0802-0902 and 9798-9211 OT Individual Time Calculation (min): 60 min and 55 min  Patient has met 2 of 4 short term goals.  Patient has made minimal progress this reporting period in the area of endurance and sitting balance, UB dressing, and self feeding.  Patient is primarily limited by pain, sensation deficits, anxiety, poor endurance, and significantly impaired memory.  Family has not been present during any OT sessions this reporting period.  Patient continues to demonstrate the following deficits: muscle weakness, muscle joint tightness, muscle spasms and impaired timing and sequencing, abnormal tone, unbalanced muscle activation, decreased sensation, pain in BUEs & BLEs, decreased cardiorespiratory endurance, decreased postural control, decreased sitting balance strategies, very poor memory, and problem solving skills.  Therefore, patient will continue to benefit from skilled OT intervention to enhance overall performance with BADL and Reduce care partner burden.  Patient progressing very slowly toward long term goals.  Continue plan of care however, may need to consider downgrading some her LTGs.  OT Short Term Goals Week 1:  OT Short Term Goal 1 (Week 1): Pt will feed self 50% of  meal with setup OT Short Term Goal 1 - Progress (Week 1): Progressing toward goal OT Short Term Goal 2 (Week 1): pt will don shirt with min a  OT Short Term Goal 2 - Progress (Week 1): Met (HOB up and long sit in bed) OT Short Term Goal 3 (Week 1): pt will tolerate sitting eob for one adl task with steady assist for 5 min OT Short Term Goal 3 - Progress (Week 1): Progressing toward goal (met before began having OH  issues 8/20 & now using THT & abdominal binder) OT Short Term Goal 4 (Week 1): pt will roll in bed with min a with bed rail for LB clothing management OT Short Term Goal 4 - Progress (Week 1): Met Week 2:  OT Short Term Goal 1 (Week 2): Pt will feed self 50% of 2 meals with setup and use of right dorsal wrist support with u-cuff and left hand for finger food PRN OT Short Term Goal 2 (Week 2): UB dressing:  Patient will donn pullover shirt sitting EOB with close supervision OT Short Term Goal 3 (Week 2): Grooming:  Patient will perform 3 grooming tasks with min assist while seated EOB or at sink. OT Short Term Goal 4 (Week 2): Toilet transfer:  Max assist slide board transfer to padded tub bench with cut out as a BSC OT Short Term Goal 5 (Week 2): BUEs:  Patient will tolerate/participate in BUE P/AA/AROM exercises at least 3/7 days  Skilled Therapeutic Interventions/Progress Updates:  1)  Patient resting in bed with HOB up.  Engaged in self care retraining to include bath, dress and groom tasks.  Focused session on functional use of BUEs, use of bath mitt with velcro strap for sponge bath, bed mobility with increased use of trunk during rolling and during long sitting.  Patient with significant pain during long sitting due to muscle tightness in hamstrings and calves therefore has been unable to reach feet to bath or to assist with LB dressing.  Patient continues to have significant memory issues and unable to recall information she has learned about all aspects of  OT training, rehab process, events that have occurred since CIR admission.  Yesterday, patient stated, "I have only had 1 Physical Therapy Session since I have been here".  2)  Patient resting in bed upon arrival.  When patient was asked why she got out of the w/c and back to bed, she did not recall ever getting out of bed with PTEngaged in self feeding tasks with focus on optimal positioning while in the bed, use of adaptive equipment and  adaptive techniques to improve independence.  Patient used right dorsal wrist support with u-cuff with a bent spoon or bent fork inserted then mod cues for technique and occasional hand over hand for quality of movement and success.  Patient used her left hand to pick up finger food such as french fries and chicken tenders using a tenodesis grasp and/or both hands for technique, stability and success.  Patient with very limited hand wrist and arm function as well as absent and impaired sensation.  Patient's tenodesis grasp on left is very weak and at this time she has no active wrist extension on right.  Patient able to drink independently from mug with snap on lid, hand and straw when mug is in optimal position.  Therapy Documentation Precautions:  Precautions Precautions: Fall Precaution Comments: bilateral ue/le, fear of falling  Restrictions Weight Bearing Restrictions: No Pain: 1) 6/10 "arms and legs" before activity, 10/10 after activity, rest, repositioned and RN provided medication near end of session. 2) no report of pain initially.  Near end of session patient reports RUE pain and discomfort following self feeding with RUE.  Rest and repositioned. ADL: See FIM for current functional status  Therapy/Group: Individual Therapy both sessions  [place], [place] 02/09/2014, 11:06 AM

## 2014-02-09 NOTE — Progress Notes (Signed)
Patient's pain remained at a 9/10 this shift with medication a, repositioning and heat therapy.  Diamantina MonksARPENTER,Kearsten Ginther, RN

## 2014-02-09 NOTE — Progress Notes (Signed)
Orthopedic Tech Progress Note Patient Details:  Crystal Stein [birth year] [id number] Delivered to nursing secretary Ortho Devices Type of Ortho Device: Abdominal binder Ortho Device/Splint Interventions: Ordered   [name] 02/09/2014, 9:13 AM

## 2014-02-09 NOTE — Plan of Care (Signed)
Problem: RH BOWEL ELIMINATION Goal: RH STG MANAGE BOWEL W/MEDICATION W/ASSISTANCE STG Manage Bowel with Medication with Assistance.  Outcome: Not Progressing Required supp with laxative  Problem: RH BLADDER ELIMINATION Goal: RH STG MANAGE BLADDER WITH ASSISTANCE STG Manage Bladder With Assistance. Min A  Outcome: Not Progressing Foley in place and managed by staff

## 2014-02-09 NOTE — Progress Notes (Signed)
INITIAL NUTRITION ASSESSMENT  Pt meets criteria for NON-SEVERE (MODERATE) MALNUTRITION in the context of chronic illness as evidenced by moderate fat and moderate-severe muscle mass depletion.  DOCUMENTATION CODES Per approved criteria  -Non-severe (moderate) malnutrition in the context of chronic illness   INTERVENTION: Provide Ensure Pudding po TID, each supplement provides 170 kcal and 4 grams of protein  Downgrade diet to dysphagia 3 with continue regular diet.  NUTRITION DIAGNOSIS: Inadequate oral intake related to difficulties chewing as evidenced by meal completion of 50-70%.   Goal: Pt to meet >/= 90% of their estimated nutrition needs   Monitor:  PO intake, weight trends, labs, I/O's  Reason for Assessment: MST  43 y.o. female  Admitting Dx: Alene Mires syndrome  ASSESSMENT: 43 y.o. female with history of depression with anxiety, recent NSTEMI, who was readmitted on 01/16/14 with recurrent chest pain as well as confusion. 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  Pt reports having a good appetite. Meal completion is 50-100%. Pt reports she has been having difficulties chewing her food as she has dentures and am not able to eat tough foods. Will downgrade diet to dysphagia 3 diet. Pt also reports her appetite frequently comes and goes due to when she has nerve pain or stress. During the time her appetite is down, pt tends to have weight loss. Pt reports her usual body weight is 110 lbs. Lately she reports her appetite has been good with no recent significant weight loss. Pt reports she usually drinks Ensure at home 2 to 3 times a day to help prevent further weight loss. Will order Ensure.   Nutrition Focused Physical Exam:  Subcutaneous Fat:  Orbital Region: Moderate depletion Upper Arm Region: WNL Thoracic and Lumbar Region: WNL  Muscle:  Temple Region: Mild depletion Clavicle Bone Region: WNL Clavicle and Acromion  Bone Region: WNL Scapular Bone Region: N/A Dorsal Hand: Moderate depletion Patellar Region: N/A Anterior Thigh Region: N/A Posterior Calf Region: Severe depletion  Edema: none  Height: Ht Readings from Last 1 Encounters:  02/01/14 5\' 4"  (1.626 m)    Weight: Wt Readings from Last 1 Encounters:  02/09/14 119 lb 11.2 oz (54.296 kg)    Ideal Body Weight: 120 lbs  % Ideal Body Weight: 99%  Wt Readings from Last 10 Encounters:  02/09/14 119 lb 11.2 oz (54.296 kg)  01/31/14 113 lb 12.1 oz (51.6 kg)  01/12/14 119 lb 14.9 oz (54.4 kg)  01/12/14 119 lb 14.9 oz (54.4 kg)  11/24/12 136 lb (61.689 kg)  11/15/12 136 lb (61.689 kg)  09/13/12 136 lb (61.689 kg)  08/15/12 136 lb (61.689 kg)  07/28/12 136 lb (61.689 kg)  07/17/12 134 lb (60.782 kg)    Usual Body Weight: 110 lbs  % Usual Body Weight: 108%  BMI:  Body mass index is 20.54 kg/(m^2).  Estimated Nutritional Needs: Kcal: 1700-1900 Protein: 70-85 grams  Fluid: 1.7 - 1.9 L/day  Skin: no issues noted  Diet Order: General  EDUCATION NEEDS: -No education needs identified at this time   Intake/Output Summary (Last 24 hours) at 02/09/14 0935 Last data filed at 02/09/14 0900  Gross per 24 hour  Intake    720 ml  Output   1226 ml  Net   -506 ml    Last BM: 8/19   Labs:   Recent Labs Lab 02/05/14 0902 02/05/14 1604 02/07/14 1558 02/08/14 0652  NA 135* 132* 134* 136*  K 4.1 4.4 3.9 3.6*  CL 98 96 99 105  CO2 23  --   --  18*  BUN 10 11 11 8   CREATININE 0.37* 0.50 0.30* 0.29*  CALCIUM 9.4  --   --  9.4  GLUCOSE 94 96 99 88    CBG (last 3)  No results found for this basename: GLUCAP,  in the last 72 hours  Scheduled Meds: . therapeutic plasma exchange solution   Dialysis Q1 Hr x 3  . ALPRAZolam  0.5 mg Oral TID  . amitriptyline  10 mg Oral QHS  . antiseptic oral rinse  7 mL Mouth Rinse BID  . calcium gluconate IVPB  4 g Intravenous Once  . enoxaparin (LOVENOX) injection  40 mg Subcutaneous Q24H   . gabapentin  400 mg Oral TID WC & HS  . heparin  1,000 Units Intracatheter Once  . levothyroxine  25 mcg Oral QAC breakfast  . nitrofurantoin (macrocrystal-monohydrate)  100 mg Oral Q12H  . pantoprazole  40 mg Oral Daily  . polyethylene glycol  17 g Oral QHS  . potassium chloride  20 mEq Oral Daily  . traZODone  50 mg Oral QHS    Continuous Infusions: . citrate dextrose      Past Medical History  Diagnosis Date  . Anxiety   . Depression   . Asthma   . 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 COPD (chronic obstructive pulmonary disease)   . GERD (gastroesophageal reflux disease)   . Bipolar disorder   . Blood transfusion 2006  . History of cardiac catheterization July 2015    Angiographically normal coronary arteries with distal tortuosity  . NSTEMI (non-ST elevated myocardial infarction)     Not secondary to obstructive CAD or obvious plaque rupture, possibly vasospasm    Past Surgical History  Procedure Laterality Date  . Tennis elbow release  04/27/06    Left; [facility], [facility]  . Multiple tooth extractions  10/07    Full upper  . Orif proximal humerus fracture  08/06    Left,  . Dilation and curettage of uterus  June 2012    Failed ablation  . Tubal ligation  June 2012    [facility]  . Bipolar disorder    . Hysteroscopy  01/27/2011    Procedure: HYSTEROSCOPY;  Surgeon: Tilda BurrowJohn V Ferguson, MD;  Location: [facility];  Service: Gynecology;  Laterality: N/A;  . Vaginal hysterectomy  05/19/2011    Procedure: HYSTERECTOMY VAGINAL;  Surgeon: Tilda BurrowJohn V Ferguson, MD;  Location: [facility];  Service: Gynecology;  Laterality: N/A;  . Orif wrist fracture  06/17/2012    Procedure: OPEN REDUCTION INTERNAL FIXATION (ORIF) WRIST FRACTURE;  Surgeon: Vickki HearingStanley E Harrison, MD;  Location: [facility];  Service: Orthopedics;  Laterality: Left;  . Carpal tunnel release  06/17/2012    Procedure: CARPAL TUNNEL RELEASE;  Surgeon: Vickki HearingStanley E Harrison, MD;  Location: [facility];  Service: Orthopedics;   Laterality: Left;    Marijean NiemannStephanie La, MS, Provisional LDN Pager # [phone] After hours/ weekend pager # [phone]

## 2014-02-09 NOTE — Progress Notes (Signed)
TPE complete, tx 5/5. Please call hemodialysis if further treatments needed. Pt has no complaints.

## 2014-02-09 NOTE — Consult Note (Signed)
RN and OT reporting patient having difficulty with cognition with poor recall as well as poor carry over. She is more lethargic and different per HD team. Will decrease neurontin to 400 mg tid....hold pm doses today. Will get ST to assist with compensatory strategies due to impaired memory. Anxiety is a big component impacting affecting cognition as well as carryover.

## 2014-02-09 NOTE — Progress Notes (Signed)
NIF 58 FVC 0.4L

## 2014-02-09 NOTE — Progress Notes (Signed)
Patient NIF and VC done with great efforts. NIF -59 VC 2100

## 2014-02-09 NOTE — Progress Notes (Signed)
Physical Therapy Weekly Progress Note  Patient Details  Name: Crystal Stein MRN: [id number] Date of Birth: 09/27/1970  Beginning of progress report period: February 02, 2014 End of progress report period: February 09, 2014  Today's Date: 02/09/2014 PT Individual Time: 1300-1400 PT Individual Time Calculation (min): 60 min   Patient has met 0 of 4 short term goals.  But is demonstration progress towards all goals and is progressing from needing +2 to 1 person assist for most mobility needs. Pt is able to perform strengthening exercises in limited range, and tolerate unsupported sitting with min-guard A with relatively stabilized blood pressure. Pt continues to be limited by anxiety, but it is manageable with support and relaxation techniques.   Patient continues to demonstrate the following deficits: weakness, pain, decreased trunk control, decreased cardiorespiratory endurance, and therefore will continue to benefit from skilled PT intervention to enhance overall performance with activity tolerance, balance, postural control, ability to compensate for deficits and functional use of  right upper extremity, right lower extremity, left upper extremity and left lower extremity.  Patient progressing toward long term goals..  Continue plan of care.  PT Short Term Goals Week 1:  PT Short Term Goal 1 (Week 1): pt will roll R with min assist after set-up PT Short Term Goal 1 - Progress (Week 1): Progressing toward goal PT Short Term Goal 2 (Week 1): pt will transfer bed > w/c with assist of 1 person consistently PT Short Term Goal 2 - Progress (Week 1): Progressing toward goal PT Short Term Goal 3 (Week 1): pt will tolerate sitting up in w/c x 2 hours at least once per day PT Short Term Goal 3 - Progress (Week 1): Progressing toward goal PT Short Term Goal 4 (Week 1): pt will demonstrate R or L lateral lean x 30 seconds with assistance in order to perform pressure relief in sitting PT Short Term Goal  4 - Progress (Week 1): Progressing toward goal Week 2:  PT Short Term Goal 1 (Week 2): Pt will roll R/L with min A  PT Short Term Goal 2 (Week 2): Pt will maintain unsupported sitting x40mn with S PT Short Term Goal 3 (Week 2): Pt will perform sliding board transfer with max A consistently PT Short Term Goal 4 (Week 2): pt will demonstrate R or L lateral lean x 30 seconds with assistance in order to perform pressure relief in sitting  Skilled Therapeutic Interventions/Progress Updates:  Tx focused on activity tolerance, therex for strengthening and stretching, sliding board transfers, standing frame, and pt education/support for anxiety and self care with pressure relief. Pt asked what she is doing for pressure relief, and she says nothing. Pt able to perform lateral lean to R/L x30 sec with assist for trunk control. [facility], but has been requesting to return to bed most days. Pt educated on importance of habituation and OOB activity. Mild BP drop in sitting, but not in orthostatic range. Unable to read standing after 2 tries.   Performed passive stretching of all LE joints and strengthening with AAROM prn 2x10 each for ankle pumps, SAQ, heel slides, hip ADD squeeze, and quad sets. Pt with very tight HS and DF groups, only able to tolerate minimal range. Pt instructed in 4in/4out breathing for relaxation.   Performed rolling with Min A after set-up and sidelying>sit with max A.  Pt able to sit EOB x578m with min-guard A due to decreased trunk control and increased  anxiety. Pt encouraged to work through fears in safe setting.   Sliding board transfer bed>WC with Max A and second as spotter, cues for  technique and positioning  Pt able to tolerate 12mn in standing frame with encouragement and breathing.   [facility] Hand off to NT.        Therapy Documentation Precautions:  Precautions Precautions:  Fall Precaution Comments: bilateral ue/le, fear of falling  Restrictions Weight Bearing Restrictions: No General:   Vital Signs: Therapy Vitals Temp: 98.9 F (37.2 C) Temp src: Oral Pulse Rate: 114 Resp: 18 BP: 107/76 mmHg Patient Position (if appropriate): Sitting Oxygen Therapy SpO2: 99 % O2 Device: None (Room air) Pain: Pain Assessment Pain Assessment: 0-10 Pain Score: 6  Pain Type: Neuropathic pain Pain Location: Hand (legs/feet) Pain Orientation: Left;Right Pain Descriptors / Indicators: Aching;Burning Pain Onset: On-going Patients Stated Pain Goal: 4 Pain Intervention(s): Medication (See eMAR)  See FIM for current functional status  Therapy/Group: Individual Therapy  KSoundra Pilon8/21/2015, 2:48 PM

## 2014-02-10 ENCOUNTER — Inpatient Hospital Stay (HOSPITAL_COMMUNITY): Payer: Self-pay

## 2014-02-10 DIAGNOSIS — G8250 Quadriplegia, unspecified: Secondary | ICD-10-CM

## 2014-02-10 MED ORDER — WHITE PETROLATUM GEL
Status: AC
  Filled 2014-02-10: qty 5

## 2014-02-10 MED ORDER — GABAPENTIN 300 MG PO CAPS
600.0000 mg | ORAL_CAPSULE | Freq: Three times a day (TID) | ORAL | Status: DC
  Administered 2014-02-10 – 2014-02-13 (×10): 600 mg via ORAL
  Filled 2014-02-10 (×18): qty 2

## 2014-02-10 NOTE — Progress Notes (Signed)
NIF-60, VC 1900

## 2014-02-10 NOTE — Plan of Care (Signed)
Problem: RH BLADDER ELIMINATION Goal: RH STG MANAGE BLADDER WITH ASSISTANCE STG Manage Bladder With Assistance. Min A  Outcome: Not Progressing Patient with foley catheter

## 2014-02-10 NOTE — Consult Note (Signed)
Agree with above. Likely baseline cognitive issues as well.

## 2014-02-10 NOTE — Plan of Care (Signed)
Problem: RH PAIN MANAGEMENT Goal: RH STG PAIN MANAGED AT OR BELOW PT'S PAIN GOAL <5  Outcome: Not Progressing C/o pain 8/10 and 10/10 numbness and tingling lower extremities;

## 2014-02-10 NOTE — Plan of Care (Signed)
Problem: RH PAIN MANAGEMENT Goal: RH STG PAIN MANAGED AT OR BELOW PT'S PAIN GOAL <4  Outcome: Not Progressing Pain rated as >4

## 2014-02-10 NOTE — Progress Notes (Signed)
Subjective/Complaints: 43 y.o. female with history of depression with anxiety, recent NSTEMI, who was readmitted on 01/16/14 with recurrent chest pain as well as confusion. CTA chest negative for PE and new hepatomegaly and hepatic steatosis noted. UDS positive for marijuana and benzos. Cardiology consulted and felt that symptoms due to psychosocial issues. Patient reported difficulty walking and well as BLE weakness on 07/29 and MRI lumbar spine without disc herniation or stenosis. 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 She was treated with a round of IVIG without much improvement. Patient with stable NIF/VC but with elevated ESR.  HD cath was placed by IVR and she was started on PLEX due to minimal response with IVIG. Confusion has resolved. Kleb UTI treated with cipro and foley placed due to urinary retention. Therapy ongoing and patient continues to have high levels of anxiety as well as fear with mobility.    Objective: Vital Signs: Blood pressure 103/72, pulse 99, temperature 98.2 F (36.8 C), temperature source Oral, resp. rate 16, height '5\' 4"'  (1.626 m), weight 54 kg (119 lb 0.8 oz), last menstrual period 04/23/2011, SpO2 100.00%. No results found. Results for orders placed during the hospital encounter of 02/01/14 (from the past 72 hour(s))  POCT I-STAT, CHEM 8     Status: Abnormal   Collection Time    02/07/14  3:58 PM      Result Value Ref Range   Sodium 134 (*) 137 - 147 mEq/L   Potassium 3.9  3.7 - 5.3 mEq/L   Chloride 99  96 - 112 mEq/L   BUN 11  6 - 23 mg/dL   Creatinine, Ser 0.30 (*) 0.50 - 1.10 mg/dL   Glucose, Bld 99  70 - 99 mg/dL   Calcium, Ion 1.37 (*) 1.12 - 1.23 mmol/L   TCO2 21  0 - 100 mmol/L   Hemoglobin 10.5 (*) 12.0 - 15.0 g/dL   HCT  31.0 (*) 36.0 - 46.0 %  CBC     Status: Abnormal   Collection Time    02/07/14  4:00 PM      Result Value Ref Range   WBC 5.9  4.0 - 10.5 K/uL   RBC 2.90 (*) 3.87 - 5.11 MIL/uL   Hemoglobin 9.7 (*) 12.0 - 15.0 g/dL   HCT 28.0 (*) 36.0 - 46.0 %   MCV 96.6  78.0 - 100.0 fL   MCH 33.4  26.0 - 34.0 pg   MCHC 34.6  30.0 - 36.0 g/dL   RDW 15.5  11.5 - 15.5 %   Platelets 308  150 - 400 K/uL  RETICULOCYTES     Status: Abnormal   Collection Time    02/07/14  4:00 PM      Result Value Ref Range   Retic Ct Pct 4.9 (*) 0.4 - 3.1 %   RBC. 2.90 (*) 3.87 - 5.11 MIL/uL   Retic Count, Manual 142.1  19.0 - 186.0 K/uL  BASIC METABOLIC PANEL     Status: Abnormal   Collection Time    02/08/14  6:52 AM      Result Value Ref Range   Sodium 136 (*) 137 - 147 mEq/L   Potassium 3.6 (*) 3.7 - 5.3 mEq/L   Chloride 105  96 - 112 mEq/L   CO2 18 (*)  19 - 32 mEq/L   Glucose, Bld 88  70 - 99 mg/dL   BUN 8  6 - 23 mg/dL   Creatinine, Ser 0.29 (*) 0.50 - 1.10 mg/dL   Calcium 9.4  8.4 - 10.5 mg/dL   GFR calc non Af Amer >90  >90 mL/min   GFR calc Af Amer >90  >90 mL/min   Comment: (NOTE)     The eGFR has been calculated using the CKD EPI equation.     This calculation has not been validated in all clinical situations.     eGFR's persistently <90 mL/min signify possible Chronic Kidney     Disease.   Anion gap 13  5 - 15  CBC     Status: Abnormal   Collection Time    02/09/14  4:42 PM      Result Value Ref Range   WBC 5.8  4.0 - 10.5 K/uL   RBC 2.65 (*) 3.87 - 5.11 MIL/uL   Hemoglobin 8.9 (*) 12.0 - 15.0 g/dL   HCT 26.2 (*) 36.0 - 46.0 %   MCV 98.9  78.0 - 100.0 fL   MCH 33.6  26.0 - 34.0 pg   MCHC 34.0  30.0 - 36.0 g/dL   RDW 15.1  11.5 - 15.5 %   Platelets 288  150 - 400 K/uL  RETICULOCYTES     Status: Abnormal   Collection Time    02/09/14  4:42 PM      Result Value Ref Range   Retic Ct Pct 4.1 (*) 0.4 - 3.1 %   RBC. 2.65 (*) 3.87 - 5.11 MIL/uL   Retic Count, Manual 108.7  19.0 - 186.0 K/uL   POCT I-STAT, CHEM 8     Status: Abnormal   Collection Time    02/09/14  4:47 PM      Result Value Ref Range   Sodium 129 (*) 137 - 147 mEq/L   Potassium 3.5 (*) 3.7 - 5.3 mEq/L   Chloride 109  96 - 112 mEq/L   BUN 8  6 - 23 mg/dL   Creatinine, Ser 0.30 (*) 0.50 - 1.10 mg/dL   Glucose, Bld 96  70 - 99 mg/dL   Calcium, Ion 1.21  1.12 - 1.23 mmol/L   TCO2 21  0 - 100 mmol/L   Hemoglobin 8.5 (*) 12.0 - 15.0 g/dL   HCT 25.0 (*) 36.0 - 46.0 %     HEENT: normal Cardio: tachy and no murmur Resp: CTA B/L and unlabored GI: BS positive and NT,ND Extremity:  Pulses positive and No Edema Skin:   Intact Neuro: 3/5 bilateral deltoids  3 minus/5 bilateral bicep  2 minus/5 bilateral tricep  3 minus/5 left finger flexors  Trace grip and wrist extension left , 2- on Right   2 minus/5 right hip flexors   1 left hip flexors   2- Right and 1/5 Left knee extensors  2- to 2+/5 bilateral hip extensors  Trace movement at ankles Decreased sense to PP/LT stocking glove bilateral UE and LE's Psych: flat, sometimes anxious, speaks in soft voice    Assessment/Plan: 1. 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 Physiatrist is providing close team supervision and 24 hour management of active medical problems listed below. Physiatrist and rehab team continue to assess barriers to discharge/monitor patient progress toward functional and medical goals. Improved Right UE and LE strength since my last exam,  encouraged pt FIM: FIM - Bathing Bathing Steps Patient Completed: Abdomen;Right Arm;Chest;Left Arm Bathing: 2: Max-Patient completes 3-4 14f10 parts or 25-49%  FIM - Upper Body Dressing/Undressing Upper body dressing/undressing steps patient completed: Thread/unthread right bra strap;Thread/unthread left bra strap;Thread/unthread right sleeve of pullover  shirt/dresss;Thread/unthread left sleeve of pullover shirt/dress;Put head through opening of pull over shirt/dress Upper body dressing/undressing: 3: Mod-Patient completed 50-74% of tasks (HOB up and long sitting) FIM - Lower Body Dressing/Undressing Lower body dressing/undressing: 1: Total-Patient completed less than 25% of tasks  FIM - Toileting Toileting: 1: Two helpers (per AJolyne Loa NT report)     FIM - Bed/Chair Transfer Bed/Chair Transfer Assistive Devices: Bed rails;Arm rests;Sliding board Bed/Chair Transfer: 2: Supine > Sit: Max A (lifting assist/Pt. 25-49%);1: Bed > Chair or W/C: Total A (helper does all/Pt. < 25%);1: Chair or W/C > Bed: Total A (helper does all/Pt. < 25%)  FIM - Locomotion: Wheelchair Distance: 150 Locomotion: Wheelchair: 1: Total Assistance/staff pushes wheelchair (Pt<25%) FIM - Locomotion: Ambulation Locomotion: Ambulation: 0: Activity did not occur  Comprehension Comprehension Mode: Auditory Comprehension: 5-Follows basic conversation/direction: With extra time/assistive device  Expression Expression Mode: Verbal Expression: 5-Expresses basic needs/ideas: With extra time/assistive device  Social Interaction Social Interaction: 5-Interacts appropriately 90% of the time - Needs monitoring or encouragement for participation or interaction.  Problem Solving Problem Solving: 5-Solves complex 90% of the time/cues < 10% of the time  Memory Memory: 5-Recognizes or recalls 90% of the time/requires cueing < 10% of the time  Medical Problem List and Plan: 1. 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  DVT Prophylaxis/Anticoagulation: Pharmaceutical: Lovenox 3. Neuropathy/Pain Management: Increase Neurontin to 600 mg tid.    -added LOW dose elavil at HS which seems to have helped  -ordered Kpad for right shoulder 4. Bipolar disorder/Mood:  Ego support, neuropsych assessment. klonopin 5. Neuropsych: This patient is capable of making decisions on her  own behalf. 6. Skin/Wound Care: Pressure relief measures to prevent breakdown. Encourage [place] Has very low protein stores--added nutritional supplement. 7. GBS: plasmapheresis per nephrology     8. Hyponatremia:  Improved-- 134 9. Hypokalemia: supplementing, re-check    10. Neurogenic bladder: continue foley. Has MRSA--- rxing  with macrodantin  -discuss voiding trial with RN  -contact precautions 11. Mild anemia: recheck CBC     12. Abnormal LFTs: Hepatitis panel negative.    -follow up  13. CV---imdur and metoprolol held due to orthostasis yesterday-  -HR still elevated due to deconditioning and anxiety  -TEDS/abd binder potentially  -encourage fluids  -a little anemic still but hgb stable   LOS (Days) 9 A FACE TO FACE EVALUATION WAS PERFORMED  KIRSTEINS,ANDREW E 02/10/2014, 9:13 AM

## 2014-02-10 NOTE — Progress Notes (Signed)
Physical Therapy Session Note  Patient Details  Name: Crystal DikeJennifer P Omara MRN: [id number] Date of Birth: 04/16/1971  Today's Date: 02/10/2014 PT Individual Time: 1105-1205 PT Individual Time Calculation (min): 60 min   Short Term Goals: Week 2:  PT Short Term Goal 1 (Week 2): Pt will roll R/L with min A  PT Short Term Goal 2 (Week 2): Pt will maintain unsupported sitting x265min with S PT Short Term Goal 3 (Week 2): Pt will perform sliding board transfer with max A consistently PT Short Term Goal 4 (Week 2): pt will demonstrate R or L lateral lean x 30 seconds with assistance in order to perform pressure relief in sitting  Skilled Therapeutic Interventions/Progress Updates:  1:1. Pt received semi-reclined in bed, ready for therapy but verbalizing 10/10 pain in B LE. RN made aware and present to provide medication towards end of session. Focus this session on B LE NMR, bed mobility, functional transfers and pressure relief.   Pt performed AROM to AAROM DF/PF, quad sets, hip/knee flexion to target isometric control, 2x10 reps each. Progressing to assisted PNF D1 pattern. Overall pt req increased assist for L LE vs. R LE.   Pt req min A to place B LE in hooklying, but (S) for B rolling w/ use of bed rails x2 for placement of abdominal binder. Max A for t/f L sidlying>sit EOB w/ HOB flat and use of bed rails. Pt experiencing onset of anxiety during set up of SBT bed>w/c, therapies cueing pt regarding relaxation techniques. Ax2 persons for SBT bed>w/c.   Reviewed importance of pressure relief w/ pt, set up new pressure relief plan w/ sign in room. Pt to be tilted every 20min between 20-50deg. Pt verbalized understanding and timer set up on pt's phone. RN and nurse tech instructed on pressure relief plan and verbalized understanding.   Pt left sitting in w/c at end of session at 20deg in preparation for lunch w/ all needs in reach, quick release belt in place.   Therapy Documentation Precautions:   Precautions Precautions: Fall Precaution Comments: bilateral ue/le, fear of falling  Restrictions Weight Bearing Restrictions: No  See FIM for current functional status  Therapy/Group: Individual Therapy  Denzil HughesKing, Dannilynn Gallina S 02/10/2014, 12:33 PM

## 2014-02-11 ENCOUNTER — Inpatient Hospital Stay (HOSPITAL_COMMUNITY): Payer: Self-pay | Admitting: Rehabilitation

## 2014-02-11 NOTE — Progress Notes (Signed)
Subjective/Complaints: 43 y.o. female with history of depression with anxiety, recent NSTEMI, who was readmitted on 01/16/14 with recurrent chest pain as well as confusion. CTA chest negative for PE and new hepatomegaly and hepatic steatosis noted. UDS positive for marijuana and benzos. Cardiology consulted and felt that symptoms due to psychosocial issues. Patient reported difficulty walking and well as BLE weakness on 07/29 and MRI lumbar spine without disc herniation or stenosis. 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 She was treated with a round of IVIG without much improvement.   Pt denies pain this am, tired, ate ok Incont bowel in diaper   Objective: Vital Signs: Blood pressure 110/64, pulse 115, temperature 98.2 F (36.8 C), temperature source Oral, resp. rate 18, height  (1.626 m), weight 54.2 kg (119 lb 7.8 oz), last menstrual period 04/23/2011, SpO2 97.00%. No results found. Results for orders placed during the hospital encounter of 02/01/14 (from the past 72 hour(s))  CBC     Status: Abnormal   Collection Time    02/09/14  4:42 PM      Result Value Ref Range   WBC 5.8  4.0 - 10.5 K/uL   RBC 2.65 (*) 3.87 - 5.11 MIL/uL   Hemoglobin 8.9 (*) 12.0 - 15.0 g/dL   HCT 81.1 (*) 91.4 - 78.2 %   MCV 98.9  78.0 - 100.0 fL   MCH 33.6  26.0 - 34.0 pg   MCHC 34.0  30.0 - 36.0 g/dL   RDW 95.6  21.3 - 08.6 %   Platelets 288  150 - 400 K/uL  RETICULOCYTES     Status: Abnormal   Collection Time    02/09/14  4:42 PM      Result Value Ref Range   Retic Ct Pct 4.1 (*) 0.4 - 3.1 %   RBC. 2.65 (*) 3.87 - 5.11 MIL/uL   Retic Count, Manual 108.7  19.0 - 186.0 K/uL  POCT I-STAT, CHEM 8     Status: Abnormal   Collection Time    02/09/14  4:47 PM      Result Value  Ref Range   Sodium 129 (*) 137 - 147 mEq/L   Potassium 3.5 (*) 3.7 - 5.3 mEq/L   Chloride 109  96 - 112 mEq/L   BUN 8  6 - 23 mg/dL   Creatinine, Ser 5.78 (*) 0.50 - 1.10 mg/dL   Glucose, Bld 96  70 - 99 mg/dL   Calcium, Ion 4.69  6.29 - 1.23 mmol/L   TCO2 21  0 - 100 mmol/L   Hemoglobin 8.5 (*) 12.0 - 15.0 g/dL   HCT 52.8 (*) 41.3 - 24.4 %     HEENT: normal Cardio: tachy and no murmur Resp: CTA B/L and unlabored GI: BS positive and NT,ND Extremity:  Pulses positive and No Edema Skin:   Intact Neuro: 3/5 bilateral deltoids  3 minus/5 bilateral bicep  2 minus/5 bilateral tricep  3 minus/5 left finger flexors  2- grip and wrist extension left , 2- on Right   2 minus/5 right hip flexors   1 left hip flexors   2- Right and 1/5 Left knee extensors  2- to 2+/5 bilateral hip extensors  Trace movement at ankles Decreased sense to PP/LT stocking glove bilateral UE and LE's  Psych: flat, sometimes anxious, speaks in soft voice    Assessment/Plan: 1. 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 Physiatrist is providing close team supervision and 24 hour management of active medical problems listed below. Physiatrist and rehab team continue to assess barriers to discharge/monitor patient progress toward functional and medical goals. Increasing bilateral finger and wrist extensor strength FIM: FIM - Bathing Bathing Steps Patient Completed: Abdomen;Right Arm;Chest;Left Arm Bathing: 2: Max-Patient completes 3-4 52f 10 parts or 25-49%  FIM - Upper Body Dressing/Undressing Upper body dressing/undressing steps patient completed: Thread/unthread right bra strap;Thread/unthread left bra strap;Thread/unthread right sleeve of pullover shirt/dresss;Thread/unthread left sleeve of pullover shirt/dress;Put head through opening of pull over shirt/dress Upper body  dressing/undressing: 3: Mod-Patient completed 50-74% of tasks (HOB up and long sitting) FIM - Lower Body Dressing/Undressing Lower body dressing/undressing: 1: Total-Patient completed less than 25% of tasks  FIM - Toileting Toileting: 1: Two helpers (per Mady Gemma, NT report)     FIM - Bed/Chair Transfer Bed/Chair Transfer Assistive Devices: Bed rails;Arm rests;Sliding board Bed/Chair Transfer: 2: Supine > Sit: Max A (lifting assist/Pt. 25-49%);1: Two helpers  FIM - Locomotion: Wheelchair Distance: 150 Locomotion: Wheelchair: 1: Total Assistance/staff pushes wheelchair (Pt<25%) FIM - Locomotion: Ambulation Locomotion: Ambulation: 0: Activity did not occur  Comprehension Comprehension Mode: Auditory Comprehension: 5-Follows basic conversation/direction: With extra time/assistive device  Expression Expression Mode: Verbal Expression: 5-Expresses basic needs/ideas: With extra time/assistive device  Social Interaction Social Interaction: 5-Interacts appropriately 90% of the time - Needs monitoring or encouragement for participation or interaction.  Problem Solving Problem Solving: 5-Solves complex 90% of the time/cues < 10% of the time  Memory Memory: 5-Recognizes or recalls 90% of the time/requires cueing < 10% of the time  Medical Problem List and Plan: 1. 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  DVT Prophylaxis/Anticoagulation: Pharmaceutical: Lovenox 3. Neuropathy/Pain Management: Increase Neurontin to 600 mg tid.    -added LOW dose elavil at HS which seems to have helped  -ordered Kpad for right shoulder 4. Bipolar disorder/Mood:  Ego support, neuropsych assessment. klonopin 5. Neuropsych: This patient is capable of making decisions on her own behalf. 6. Skin/Wound Care: Pressure relief measures to prevent breakdown. Encourage boosting when OOB. Has very low protein stores--added nutritional supplement. 7. GBS: plasmapheresis per nephrology     8. Hyponatremia:   Improved-- 134 9. Hypokalemia: supplementing, re-check    10. Neurogenic bladder: continue foley. Has MRSA--- rxing  with macrodantin  -discuss voiding trial with RN  -contact precautions 11. Mild anemia: recheck CBC     12. Abnormal LFTs: Hepatitis panel negative.    -follow up  13. CV---imdur and metoprolol held due to orthostasis yesterday-  -HR still elevated due to deconditioning and anxiety  -TEDS/abd binder potentially  -encourage fluids  - anemic still but hgb stable   LOS (Days) 10 A FACE TO FACE EVALUATION WAS PERFORMED  Senetra Dillin E 02/11/2014, 8:44 AM

## 2014-02-11 NOTE — Progress Notes (Signed)
NIF -52 and VC 1.8 performed with good efforts.

## 2014-02-11 NOTE — Progress Notes (Signed)
Physical Therapy Session Note  Patient Details  Name: Crystal Stein MRN: [id number] Date of Birth: 02/03/1971  Today's Date: 02/11/2014 PT Individual Time: 0930-1004 PT Individual Time Calculation (min): 34 min   Short Term Goals: Week 2:  PT Short Term Goal 1 (Week 2): Pt will roll R/L with min A  PT Short Term Goal 2 (Week 2): Pt will maintain unsupported sitting x28min with S PT Short Term Goal 3 (Week 2): Pt will perform sliding board transfer with max A consistently PT Short Term Goal 4 (Week 2): pt will demonstrate R or L lateral lean x 30 seconds with assistance in order to perform pressure relief in sitting  Skilled Therapeutic Interventions/Progress Updates:   Pt received lying in bed this morning, agreeable to therapy session.  Note pt with increased c/o pain in BLEs, however states that she has already gotten pain meds this morning. Pt agreeable to performing bed level therex then transferring to w/c at end of session.  Provided total A to don TEDS and abd binder prior to getting OOB.  Performed BLE therex as follows, heel slides x 10 reps, hip abd/add x 10 reps, SLR (active assisted) x 10 reps and quad sets x 10 reps.  Pt states LEs feel better following exercise.  Performed rolling x 3 reps to don shorts and to get to EOB at mod A with pt able to use UEs on rails and assist for LE/hip.  Once in SL, requires max A to sit at EOB for LEs out of bed and for trunk and weight shift to opposite hip.  Transferred to w/c with use of slideboard with +2 assist for safety (pt perform approx 15%).  Pt continues to be anxious and has difficult time following commands due to this. Pt left in w/c reclined to approx 30 deg w/ QRB donned. All needs in reach.  Pt able to recall how often to get pressure reliefs from nurses.    Therapy Documentation Precautions:  Precautions Precautions: Fall Precaution Comments: bilateral ue/le, fear of falling  Restrictions Weight Bearing Restrictions: No    Pain: Pain Assessment Pain Assessment: 0-10 Pain Score: 5  Pain Type: Acute pain;Chronic pain Pain Location: Leg Pain Orientation: Right;Left Pain Radiating Towards:  (shoulders all the way down) Pain Descriptors / Indicators: Aching;Tightness;Numbness Pain Frequency: Constant Pain Onset: On-going Patients Stated Pain Goal: 3 Pain Intervention(s): Medication (See eMAR)  See FIM for current functional status  Therapy/Group: Individual Therapy  Vista Deck 02/11/2014, 12:14 PM

## 2014-02-11 NOTE — Progress Notes (Signed)
vc 1.9L,  NIF -50.2cmH20  Pt sitting in chair.  Good effort

## 2014-02-11 NOTE — Progress Notes (Signed)
Nif -55 VC 1900 with good efforts

## 2014-02-12 ENCOUNTER — Encounter (HOSPITAL_COMMUNITY): Payer: Self-pay | Admitting: Occupational Therapy

## 2014-02-12 ENCOUNTER — Inpatient Hospital Stay (HOSPITAL_COMMUNITY): Payer: Self-pay | Admitting: Speech Pathology

## 2014-02-12 ENCOUNTER — Inpatient Hospital Stay (HOSPITAL_COMMUNITY): Payer: Self-pay | Admitting: Physical Therapy

## 2014-02-12 ENCOUNTER — Inpatient Hospital Stay (HOSPITAL_COMMUNITY): Payer: Medicaid Other | Admitting: *Deleted

## 2014-02-12 DIAGNOSIS — G8250 Quadriplegia, unspecified: Secondary | ICD-10-CM

## 2014-02-12 LAB — BASIC METABOLIC PANEL
Anion gap: 13 (ref 5–15)
BUN: 9 mg/dL (ref 6–23)
CHLORIDE: 101 meq/L (ref 96–112)
CO2: 23 mEq/L (ref 19–32)
Calcium: 9.6 mg/dL (ref 8.4–10.5)
Creatinine, Ser: 0.34 mg/dL — ABNORMAL LOW (ref 0.50–1.10)
GFR calc Af Amer: >90 mL/min (ref >90)
GFR calc non Af Amer: >90 mL/min (ref >90)
GLUCOSE: 78 mg/dL (ref 70–99)
POTASSIUM: 3.9 meq/L (ref 3.7–5.3)
Sodium: 137 mEq/L (ref 137–147)

## 2014-02-12 LAB — CBC
HCT: 28.7 % — ABNORMAL LOW (ref 36.0–46.0)
Hemoglobin: 9.8 g/dL — ABNORMAL LOW (ref 12.0–15.0)
MCH: 33.8 pg (ref 26.0–34.0)
MCHC: 34.1 g/dL (ref 30.0–36.0)
MCV: 99 fL (ref 78.0–100.0)
Platelets: 310 10*3/uL (ref 150–400)
RBC: 2.9 MIL/uL — ABNORMAL LOW (ref 3.87–5.11)
RDW: 14.6 % (ref 11.5–15.5)
WBC: 5.4 10*3/uL (ref 4.0–10.5)

## 2014-02-12 LAB — TROPONIN I: Troponin I: <0.3 ng/mL (ref <0.30)

## 2014-02-12 NOTE — Progress Notes (Signed)
Occupational Therapy Session Note  Patient Details  Name: Crystal Stein MRN: [id number] Date of Birth: 11/18/1970  Today's Date: 02/12/2014 OT Individual Time: 1219-7588 OT Individual Time Calculation (min): 45 min   Short Term Goals: Week 1:  OT Short Term Goal 1 (Week 1): Pt will feed self 50% of  meal with setup OT Short Term Goal 1 - Progress (Week 1): Progressing toward goal OT Short Term Goal 2 (Week 1): pt will don shirt with min a  OT Short Term Goal 2 - Progress (Week 1): Met (HOB up and long sit in bed) OT Short Term Goal 3 (Week 1): pt will tolerate sitting eob for one adl task with steady assist for 5 min OT Short Term Goal 3 - Progress (Week 1): Progressing toward goal (met before began having OH issues 8/20 & now using THT & abdominal binder) OT Short Term Goal 4 (Week 1): pt will roll in bed with min a with bed rail for LB clothing management OT Short Term Goal 4 - Progress (Week 1): Met Week 2:  OT Short Term Goal 1 (Week 2): Pt will feed self 50% of 2 meals with setup and use of right dorsal wrist support with u-cuff and left hand for finger food PRN OT Short Term Goal 2 (Week 2): UB dressing:  Patient will donn pullover shirt sitting EOB with close supervision OT Short Term Goal 3 (Week 2): Grooming:  Patient will perform 3 grooming tasks with min assist while seated EOB or at sink. OT Short Term Goal 4 (Week 2): Toilet transfer:  Max assist slide board transfer to padded tub bench with cut out as a BSC OT Short Term Goal 5 (Week 2): BUEs:  Patient will tolerate/participate in BUE P/AA/AROM exercises at least 3/7 days  Skilled Therapeutic Interventions/Progress Updates:    Pt seen this am to facilitate BADL and functional mobility skills with a focus on bed mobility, trunk control, and sitting balance.  Pt sitting in bed at start of session stating she was in a great deal of pain even though she had received her pain medication. Pt was agreeable to working on self care.  With [facility] raised pt was able to wash UB by loosely gripping washcloth and then used washmit for washing upper thighs. She tolerated long sitting to reach all the way to her ankles. She worked on core strength and LE stability with flexing knees and rolling side to side in bed with min- mod A and bridging hips for short periods of time to don clothing over hips. Pt c/o pain with movement but tolerated it.  She sat to EOB with max A (pt pushing through LUE to A) and felt dizzy. After 2 min rest, felt well enough to work on reciprocal scooting to touch feet to floor, Rested again after feeling dizzy and then donned shirt with min to mod A.  Pt's PT arrived for her next session.   Therapy Documentation Precautions:  Precautions Precautions: Fall Precaution Comments: bilateral ue/le, fear of falling  Restrictions Weight Bearing Restrictions: No      Pain: Pain Assessment Pain Assessment: 0-10 Pain Score: 9  Faces Pain Scale: Hurts even more Pain Type: Acute pain Pain Location: Leg Pain Orientation: Right;Left Pain Descriptors / Indicators: Aching;Constant Pain Frequency: Constant Pain Onset: On-going Patients Stated Pain Goal: 3 Pain Intervention(s): Medication (See eMAR) ADL: ADL ADL Comments: see fim  See FIM for current functional status  Therapy/Group: Individual Therapy  Mead 02/12/2014, 10:39 AM

## 2014-02-12 NOTE — Progress Notes (Signed)
Subjective/Complaints: 43 y.o. female with history of depression with anxiety, recent NSTEMI, who was readmitted on 01/16/14 with recurrent chest pain as well as confusion. CTA chest negative for PE and new hepatomegaly and hepatic steatosis noted. UDS positive for marijuana and benzos. Cardiology consulted and felt that symptoms due to psychosocial issues. Patient reported difficulty walking and well as BLE weakness on 07/29 and MRI lumbar spine without disc herniation or stenosis. 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 She was treated with a round of IVIG without much improvement.   Slept fairly well. Slow to arouse however this am    Objective: Vital Signs: Blood pressure 98/68, pulse 127, temperature 99.9 F (37.7 C), temperature source Oral, resp. rate 18, height  (1.626 m), weight 54.7 kg (120 lb 9.5 oz), last menstrual period 04/23/2011, SpO2 99.00%. No results found. Results for orders placed during the hospital encounter of 02/01/14 (from the past 72 hour(s))  CBC     Status: Abnormal   Collection Time    02/09/14  4:42 PM      Result Value Ref Range   WBC 5.8  4.0 - 10.5 K/uL   RBC 2.65 (*) 3.87 - 5.11 MIL/uL   Hemoglobin 8.9 (*) 12.0 - 15.0 g/dL   HCT 96.0 (*) 45.4 - 09.8 %   MCV 98.9  78.0 - 100.0 fL   MCH 33.6  26.0 - 34.0 pg   MCHC 34.0  30.0 - 36.0 g/dL   RDW 11.9  14.7 - 82.9 %   Platelets 288  150 - 400 K/uL  RETICULOCYTES     Status: Abnormal   Collection Time    02/09/14  4:42 PM      Result Value Ref Range   Retic Ct Pct 4.1 (*) 0.4 - 3.1 %   RBC. 2.65 (*) 3.87 - 5.11 MIL/uL   Retic Count, Manual 108.7  19.0 - 186.0 K/uL  POCT I-STAT, CHEM 8     Status: Abnormal   Collection Time    02/09/14  4:47 PM      Result Value Ref Range   Sodium 129 (*) 137 - 147 mEq/L   Potassium 3.5 (*) 3.7 - 5.3 mEq/L   Chloride 109  96 - 112 mEq/L   BUN 8  6 - 23 mg/dL   Creatinine, Ser 5.62 (*) 0.50 - 1.10 mg/dL   Glucose, Bld 96  70 - 99 mg/dL   Calcium, Ion 1.30  8.65 - 1.23 mmol/L   TCO2 21  0 - 100 mmol/L   Hemoglobin 8.5 (*) 12.0 - 15.0 g/dL   HCT 78.4 (*) 69.6 - 29.5 %     HEENT: normal Cardio: tachy and no murmur Resp: CTA B/L and unlabored GI: BS positive and NT,ND Extremity:  Pulses positive and No Edema Skin:   Intact Neuro: 3/5 bilateral deltoids  3 minus/5 bilateral bicep  2 minus/5 bilateral tricep  3 minus/5 left finger flexors  2- grip and wrist extension left , 2- on Right   2 minus/5 right hip flexors   1 left hip flexors   2- Right and 1/5 Left knee extensors  2- to 2+/5 bilateral hip extensors  Trace movement at ankles Decreased sense to PP/LT stocking glove bilateral UE and LE's Psych: flat, sometimes  anxious, speaks in soft voice    Assessment/Plan: 1. 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 Physiatrist is providing close team supervision and 24 hour management of active medical problems listed below. Physiatrist and rehab team continue to assess barriers to discharge/monitor patient progress toward functional and medical goals. Increasing bilateral finger and wrist extensor strength FIM: FIM - Bathing Bathing Steps Patient Completed: Abdomen;Right Arm;Chest;Left Arm Bathing: 2: Max-Patient completes 3-4 58f 10 parts or 25-49%  FIM - Upper Body Dressing/Undressing Upper body dressing/undressing steps patient completed: Thread/unthread right bra strap;Thread/unthread left bra strap;Thread/unthread right sleeve of pullover shirt/dresss;Thread/unthread left sleeve of pullover shirt/dress;Put head through opening of pull over shirt/dress Upper body dressing/undressing: 3:  Mod-Patient completed 50-74% of tasks (HOB up and long sitting) FIM - Lower Body Dressing/Undressing Lower body dressing/undressing: 1: Total-Patient completed less than 25% of tasks  FIM - Toileting Toileting: 1: Two helpers (per Mady Gemma, NT report)     FIM - Bed/Chair Transfer Bed/Chair Transfer Assistive Devices: Bed rails;Arm rests;Sliding board Bed/Chair Transfer: 1: Two helpers;2: Supine > Sit: Max A (lifting assist/Pt. 25-49%)  FIM - Locomotion: Wheelchair Distance: 150 Locomotion: Wheelchair: 1: Total Assistance/staff pushes wheelchair (Pt<25%) FIM - Locomotion: Ambulation Locomotion: Ambulation: 0: Activity did not occur  Comprehension Comprehension Mode: Auditory Comprehension: 5-Follows basic conversation/direction: With extra time/assistive device  Expression Expression Mode: Verbal Expression: 5-Expresses basic needs/ideas: With extra time/assistive device  Social Interaction Social Interaction: 5-Interacts appropriately 90% of the time - Needs monitoring or encouragement for participation or interaction.  Problem Solving Problem Solving: 5-Solves complex 90% of the time/cues < 10% of the time  Memory Memory: 5-Recognizes or recalls 90% of the time/requires cueing < 10% of the time  Medical Problem List and Plan: 1. 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  DVT Prophylaxis/Anticoagulation: Pharmaceutical: Lovenox 3. Neuropathy/Pain Management: Increase Neurontin to 600 mg tid.    -added LOW dose elavil at HS which seems to have helped  -ordered Kpad for right shoulder 4. Bipolar disorder/Mood:  Ego support, neuropsych assessment. klonopin 5. Neuropsych: This patient is capable of making decisions on her own behalf. 6. Skin/Wound Care: Pressure relief measures to prevent breakdown. Encourage boosting when OOB. Has very low protein stores--added nutritional supplement. 7. GBS: plasmapheresis per nephrology     8. Hyponatremia:  Improved-- 134 9.  Hypokalemia: supplementing, re-check    10. Neurogenic bladder: continue foley due incontinence, generalize weakness.  - Has MRSA---     -contact precautions 11. Mild anemia: recheck CBC     12. Abnormal LFTs: Hepatitis panel negative.    -follow up  13. CV---imdur and metoprolol held due to orthostasis yesterday-  -HR still elevated due to deconditioning and anxiety  -TEDS/abd binder potentially  -encourage fluids  - anemic still but hgb stable   LOS (Days) 11 A FACE TO FACE EVALUATION WAS PERFORMED  Crystal Stein 02/12/2014, 8:06 AM

## 2014-02-12 NOTE — Progress Notes (Signed)
Physical Therapy Session Note  Patient Details  Name: Crystal Stein MRN: [id number] Date of Birth: 1971/01/18  Today's Date: 02/12/2014 PT Individual Time: 1103-1205 PT Individual Time Calculation (min): 62 min   Short Term Goals: Week 1:  PT Short Term Goal 1 (Week 1): pt will roll R with min assist after set-up PT Short Term Goal 1 - Progress (Week 1): Progressing toward goal PT Short Term Goal 2 (Week 1): pt will transfer bed > w/c with assist of 1 person consistently PT Short Term Goal 2 - Progress (Week 1): Progressing toward goal PT Short Term Goal 3 (Week 1): pt will tolerate sitting up in w/c x 2 hours at least once per day PT Short Term Goal 3 - Progress (Week 1): Progressing toward goal PT Short Term Goal 4 (Week 1): pt will demonstrate R or L lateral lean x 30 seconds with assistance in order to perform pressure relief in sitting PT Short Term Goal 4 - Progress (Week 1): Progressing toward goal Week 2:  PT Short Term Goal 1 (Week 2): Pt will roll R/L with min A  PT Short Term Goal 2 (Week 2): Pt will maintain unsupported sitting x74min with S PT Short Term Goal 3 (Week 2): Pt will perform sliding board transfer with max A consistently PT Short Term Goal 4 (Week 2): pt will demonstrate R or L lateral lean x 30 seconds with assistance in order to perform pressure relief in sitting  Skilled Therapeutic Interventions/Progress Updates:  Pt. Received seated in w/c with tray positioned in front. Pt. States she is very lightheaded and doesn't feel well. Pt. Agreeable to therapy session; "I'll do what I can." Pt. Room>gym in w/c max A.  1:1 Treatment focused on therapeutic exercises, positional tolerance, and functional transfers. Pt. Assist level is max A to +2A for transfer. Pt. Required frequent therapeutic rest/recovery breaks due to feeling fatigued with intermittent pain-like sensations in distal extremities that dissipated with repositioning. Pt. Performed BLE exercises (hip  flex, knee flex, hip add) with therapist AAROM and therapist providing resistance for increased strength and proprioception. Pt. Required visual confirmation for activities of proprioception (especially with L LE) with therapist providing additional tactile cues for indication of limb positioning. Pt. Had difficulty remaining focused due to overall discomfort/fatigue related to condition. W/C >room max A.  Pain: see below  Pt. Condition post therapy session: positioned in bed with HOB elevated with all needs within reach +2A for transfers and bed positioning. Bed alarm activated with 3 rails up for safety. Pt. Requested assistance with chap stick and water/refreshment due to current condition. Pt demonstrated use of pressure sensitive RN call button.  RN requested to room by therapist to attend patient c/o chest discomfort and fear that she was having a heart-attack. Pt. In RN care upon exiting session.   Therapy Documentation Precautions:  Precautions Precautions: Fall Precaution Comments: bilateral ue/le, fear of falling  Restrictions Weight Bearing Restrictions: No  Vital Signs: Therapy Vitals Pulse Rate: 103-122 BP: 100/76 mmHg Patient Position (if appropriate): seated Oxygen Therapy SpO2: 98 O2 Device: None (Room air)  Pain: 8/10 all over; RN made aware  Locomotion : Wheelchair Mobility Distance: 150 x 2   See FIM for current functional status  Therapy/Group: Individual Therapy  Raiford Noble 02/12/2014, 3:49 PM

## 2014-02-12 NOTE — Plan of Care (Signed)
Problem: RH BLADDER ELIMINATION Goal: RH STG MANAGE BLADDER WITH ASSISTANCE STG Manage Bladder With Assistance. Min A  Outcome: Not Progressing Foley catheter for urinary retention   Problem: RH PAIN MANAGEMENT Goal: RH STG PAIN MANAGED AT OR BELOW PT'S PAIN GOAL <4  Outcome: Not Progressing Pain scored as greater than 4 at all times

## 2014-02-12 NOTE — Progress Notes (Signed)
Attempted to perfrom nif/vc pt not available .  Therpist and or md with pt.

## 2014-02-12 NOTE — Progress Notes (Signed)
Attempted NIF/VC Pt was in therapy will attempt later

## 2014-02-12 NOTE — Progress Notes (Signed)
Physical Therapy Session Note  Patient Details  Name: Crystal Stein MRN: [id number] Date of Birth: 10/12/1970  Today's Date: 02/12/2014 PT Individual Time: 0930-1000 PT Individual Time Calculation (min): 30 min   Short Term Goals: Week 1:  PT Short Term Goal 1 (Week 1): pt will roll R with min assist after set-up PT Short Term Goal 1 - Progress (Week 1): Progressing toward goal PT Short Term Goal 2 (Week 1): pt will transfer bed > w/c with assist of 1 person consistently PT Short Term Goal 2 - Progress (Week 1): Progressing toward goal PT Short Term Goal 3 (Week 1): pt will tolerate sitting up in w/c x 2 hours at least once per day PT Short Term Goal 3 - Progress (Week 1): Progressing toward goal PT Short Term Goal 4 (Week 1): pt will demonstrate R or L lateral lean x 30 seconds with assistance in order to perform pressure relief in sitting PT Short Term Goal 4 - Progress (Week 1): Progressing toward goal Week 2:  PT Short Term Goal 1 (Week 2): Pt will roll R/L with min A  PT Short Term Goal 2 (Week 2): Pt will maintain unsupported sitting x62min with S PT Short Term Goal 3 (Week 2): Pt will perform sliding board transfer with max A consistently PT Short Term Goal 4 (Week 2): pt will demonstrate R or L lateral lean x 30 seconds with assistance in order to perform pressure relief in sitting  Skilled Therapeutic Interventions/Progress Updates:   Pt received EOB finishing OT B&D; rehab tech present to learn am stretching program for pt.  Pt returned to supine in bed with mod A with pt initiating lowering to elbow and bringing LE into bed with verbal cues.  In supine, assessed ROM of LE musculature and demonstrated to rehab tech prolonged stretches for bilat gastroc, hamstrings, adductors, quad, and hip flexor muscles in sidelying.  Rehab tech gave repeat demonstration on R side; stretching instructions also posted above HOB.  Following stretches donned abdominal binder in supine with pt  assisting with rolling to L and R mod A.  Performed supine > sit max A.  Performed slideboard transfer bed > w/c with total A with pt demonstrating less anxiety but still limited ability to assist with pushing through LE or UE.  Pt set up in w/c in semi-reclined position with UE supported and all items within reach.   Therapy Documentation Precautions:  Precautions Precautions: Fall Precaution Comments: bilateral ue/le, fear of falling  Restrictions Weight Bearing Restrictions: No Pain: Pain Assessment Faces Pain Scale: Hurts even more Pain Type: Acute pain Pain Location: Leg Pain Orientation: Right;Left Pain Descriptors / Indicators: Cramping Pain Onset: Sudden (with stretching) Pain Intervention(s): Repositioned;Rest  See FIM for current functional status  Therapy/Group: Individual Therapy  Edman Circle [facility] 02/12/2014, 10:24 AM

## 2014-02-12 NOTE — Evaluation (Signed)
Speech Language Pathology Assessment and Plan  Patient Details  Name: Crystal Stein MRN: [id number] Date of Birth: 1970-11-15  SLP Diagnosis: Voice disorder;Cognitive Impairments  Rehab Potential: Fair ELOS: 10-14 days     Today's Date: 02/12/2014 SLP Individual Time: 1305-1405 SLP Individual Time Calculation (min): 60 min   Problem List:  Patient Active Problem List   Diagnosis Date Noted  . 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 Guillain Barr syndrome 01/21/2014  . Quadriplegia and quadriparesis 01/21/2014  . Bilateral leg weakness 01/20/2014  . Hyperkalemia 01/19/2014  . UTI (urinary tract infection) 01/19/2014  . Hypokalemia 01/18/2014  . 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 Acute encephalopathy 01/17/2014  . Substance abuse 01/17/2014  . Sinus tachycardia 01/17/2014  . Elevated transaminase level 01/17/2014  . Metabolic acidosis 29/24/4628  . Chest pressure 01/16/2014  . Chest pain 01/16/2014  . Tobacco abuse 01/12/2014  . Dyslipidemia 01/12/2014  . NSTEMI (non-ST elevated myocardial infarction) 01/10/2014  . Left ankle sprain 11/28/2012  . Carpal tunnel syndrome of left wrist 11/15/2012  . Left ankle instability 11/15/2012  . Muscle weakness (generalized) 08/19/2012  . Wrist fracture 08/15/2012  . Wrist pain, left 07/01/2012  . Edema of hand 07/01/2012  . Radius distal fracture 06/16/2012  . Ankle sprain 11/04/2011  . OTHER ABNORMAL BLOOD CHEMISTRY 01/22/2009  . LIVER FUNCTION TESTS, ABNORMAL, HX OF 01/16/2009  . ANEMIA 09/13/2008  . DIARRHEA NOS 09/13/2008  . ABDOMINAL PAIN, GENERALIZED 09/13/2008  . ABDOMINAL PAIN, LOWER 09/13/2008  . ABNORMAL TRANSAMINASE, (LFT'S) 09/13/2008  . RUQ PAIN 08/17/2008  . ACUTE SINUSITIS, UNSPECIFIED 07/23/2008  . ACUTE BRONCHITIS 07/23/2008  . LATERAL EPICONDYLITIS 04/25/2008  . CUBITAL TUNNEL SYNDROME 02/07/2008  . Pain in Joint, Upper Arm 02/07/2008  . CLOSED FRACTURE OF SURGICAL NECK OF HUMERUS  02/07/2008  . BIPOLAR DISORDER UNSPECIFIED 12/02/2007  . ANXIETY, CHRONIC 12/02/2007  . NICOTINE ADDICTION 12/02/2007  . NECK PAIN, CHRONIC 12/02/2007   Past Medical History:  Past Medical History  Diagnosis Date  . Anxiety   . Depression   . Asthma   . 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 COPD (chronic obstructive pulmonary disease)   . GERD (gastroesophageal reflux disease)   . Bipolar disorder   . Blood transfusion 2006  . History of cardiac catheterization July 2015    Angiographically normal coronary arteries with distal tortuosity  . NSTEMI (non-ST elevated myocardial infarction)     Not secondary to obstructive CAD or obvious plaque rupture, possibly vasospasm   Past Surgical History:  Past Surgical History  Procedure Laterality Date  . Tennis elbow release  04/27/06    Left; [facility], [facility]  . Multiple tooth extractions  10/07    Full upper  . Orif proximal humerus fracture  08/06    Left,  . Dilation and curettage of uterus  June 2012    Failed ablation  . Tubal ligation  June 2012    [facility]  . Bipolar disorder    . Hysteroscopy  01/27/2011    Procedure: HYSTEROSCOPY;  Surgeon: Jonnie Kind, MD;  Location: [facility];  Service: Gynecology;  Laterality: N/A;  . Vaginal hysterectomy  05/19/2011    Procedure: HYSTERECTOMY VAGINAL;  Surgeon: Jonnie Kind, MD;  Location: [facility];  Service: Gynecology;  Laterality: N/A;  . Orif wrist fracture  06/17/2012    Procedure: OPEN REDUCTION INTERNAL FIXATION (ORIF) WRIST FRACTURE;  Surgeon: Carole Civil, MD;  Location: [facility];  Service: Orthopedics;  Laterality: Left;  .  Carpal tunnel release  06/17/2012    Procedure: CARPAL TUNNEL RELEASE;  Surgeon: Carole Civil, MD;  Location: [facility];  Service: Orthopedics;  Laterality: Left;    Assessment / Plan / Recommendation Clinical Impression [name] is a 43 y.o. female with history of depression with anxiety, recent NSTEMI, who was readmitted  on 01/16/14 with recurrent chest pain as well as confusion. CTA chest negative for PE and new hepatomegaly and hepatic steatosis noted. UDS positive for marijuana and benzos. Cardiology consulted and felt that symptoms due to psychosocial issues. Patient reported difficulty walking and well as BLE weakness on 07/29 and MRI lumbar spine without disc herniation or stenosis. 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 Therapy ongoing and patient continues to have high levels of anxiety as well as fear with mobility. Noted to have BLE ataxia with instability as well as neuropathy with BUE affecting mobility. On 01/31/2014 patient had an episode of chest pain. EKG showed sinus tachycardia. Troponin was normal MD, therapy team recommending CIR to lessen burden of care and patient admitted 02/01/14. Following 1 week of therapy with OT/PT SLP was ordered to address decreased speech intelligibility.  Cognitive-linguistic evaluation completed and patient demonstrates moderately severe dysphonia impacting her speech intelligibility as well as impaired recall of new/daily information.  As a result, skilled SLP services are warranted to address deficits and maximize functional gains prior to discharge.     Skilled Therapeutic Interventions          SLP initiated education regarding diaphragmatic breathing; patient required Max multimodal cues to accurately complete.    SLP Assessment  Patient will need skilled [facility] during CIR admission    Recommendations  Oral Care Recommendations: Oral care BID Recommendations for Other Services: Neuropsych consult Patient destination:  (TBD) Follow up Recommendations: 24 hour supervision/assistance;Skilled Nursing facility;Home Health SLP Equipment Recommended: None recommended by SLP    SLP Frequency 3 out of 7 days   SLP  Treatment/Interventions Cueing hierarchy;Environmental controls;Functional tasks;Medication managment;Patient/family education;Speech/Language facilitation;Therapeutic Exercise    Pain Pain Assessment Pain Score: 8  Prior Functioning Cognitive/Linguistic Baseline: Information not available  Short Term Goals: Week 1: SLP Short Term Goal 1 (Week 1): Patient will demonstrate diaphragmatic breathing with Min verbal cues SLP Short Term Goal 2 (Week 1): Patient will utilize pacing/chunking with speech production at the phrase level with Mod verbal/written cues SLP Short Term Goal 3 (Week 1): Patient will utilize exteral aids to assist with recall of daily information with Min question cues.  See FIM for current functional status Refer to Care Plan for Long Term Goals  Recommendations for other services: Neuropsych  Discharge Criteria: Patient will be discharged from SLP if patient refuses treatment 3 consecutive times without medical reason, if treatment goals not met, if there is a change in medical status, if patient makes no progress towards goals or if patient is discharged from hospital.  The above assessment, treatment plan, treatment alternatives and goals were discussed and mutually agreed upon: by patient  Gunnar Fusi, M.A., CCC-SLP [phone]  [place] 02/12/2014, 2:48 PM

## 2014-02-12 NOTE — Progress Notes (Signed)
Patient with chest pain. Does have soreness of chest wall to palpation but this feels "like an elephant sitting on the chest".  Discussed neuropathic symptoms but patient reports "I've never had this pain before". Last episode 8/18 with negative work up. Will check EKG. Is unable to tolerate BP or Imdur due to low BP. Symptoms related in part due to anxiety and question discomfort from IJ. Neurology does not plan on another course at this time and will revisit in 3-4 works depending on recovery.

## 2014-02-13 ENCOUNTER — Inpatient Hospital Stay (HOSPITAL_COMMUNITY): Payer: Medicaid Other | Admitting: Occupational Therapy

## 2014-02-13 ENCOUNTER — Encounter (HOSPITAL_COMMUNITY): Payer: Self-pay | Admitting: *Deleted

## 2014-02-13 ENCOUNTER — Inpatient Hospital Stay (HOSPITAL_COMMUNITY): Payer: Self-pay

## 2014-02-13 ENCOUNTER — Inpatient Hospital Stay (HOSPITAL_COMMUNITY): Payer: Self-pay | Admitting: Occupational Therapy

## 2014-02-13 ENCOUNTER — Inpatient Hospital Stay (HOSPITAL_COMMUNITY): Payer: Medicaid Other

## 2014-02-13 ENCOUNTER — Inpatient Hospital Stay (HOSPITAL_COMMUNITY): Payer: Medicaid Other | Admitting: *Deleted

## 2014-02-13 DIAGNOSIS — G8250 Quadriplegia, unspecified: Secondary | ICD-10-CM

## 2014-02-13 LAB — TROPONIN I
Troponin I: <0.3 ng/mL (ref <0.30)
Troponin I: <0.3 ng/mL (ref <0.30)

## 2014-02-13 MED ORDER — GABAPENTIN 300 MG PO CAPS
300.0000 mg | ORAL_CAPSULE | Freq: Three times a day (TID) | ORAL | Status: DC
  Administered 2014-02-13 – 2014-02-21 (×23): 300 mg via ORAL
  Filled 2014-02-13 (×26): qty 1

## 2014-02-13 MED ORDER — CHLORHEXIDINE GLUCONATE 4 % EX LIQD
CUTANEOUS | Status: AC
  Filled 2014-02-13: qty 15

## 2014-02-13 MED ORDER — LIDOCAINE HCL 1 % IJ SOLN
INTRAMUSCULAR | Status: AC
  Filled 2014-02-13: qty 20

## 2014-02-13 MED ORDER — SALINE SPRAY 0.65 % NA SOLN
1.0000 | NASAL | Status: DC | PRN
  Administered 2014-02-14: 1 via NASAL
  Filled 2014-02-13: qty 44

## 2014-02-13 NOTE — Progress Notes (Signed)
Called to discontinue HD cath per MD order. Upon assessment, HD catheter noted to be a tunneled permanent catheter. Discontinuance not attempted. Requires MD/PA/NP to discontinue. Primary RN Stacy notified to contact MD.

## 2014-02-13 NOTE — Plan of Care (Signed)
LTG downgraded to mod A overall transfers, min A manual w/c propulsion in controlled environment with home and community w/c mobility and car transfer goals D/C secondary to pt now SNF placement.

## 2014-02-13 NOTE — Progress Notes (Signed)
Occupational Therapy Session Note  Patient Details  Name: Crystal Stein MRN: [id number] Date of Birth: Mar 06, 1971  Today's Date: 02/13/2014 OT Individual Time: 1400-1505 OT Individual Time Calculation (min): 65 min   Short Term Goals: Week 2:  OT Short Term Goal 1 (Week 2): Pt will feed self 50% of 2 meals with setup and use of right dorsal wrist support with u-cuff and left hand for finger food PRN OT Short Term Goal 2 (Week 2): UB dressing:  Patient will donn pullover shirt sitting EOB with close supervision OT Short Term Goal 3 (Week 2): Grooming:  Patient will perform 3 grooming tasks with min assist while seated EOB or at sink. OT Short Term Goal 4 (Week 2): Toilet transfer:  Max assist slide board transfer to padded tub bench with cut out as a BSC OT Short Term Goal 5 (Week 2): BUEs:  Patient will tolerate/participate in BUE P/AA/AROM exercises at least 3/7 days  Skilled Therapeutic Interventions/Progress Updates:    1:1 Pt in tilt and space w/c when arrived. Pt with c/o needing to have a BM but can not. Played soft music in the background to promote calming environment.  Pt appeared to have an increased anxiety level with discussion of how assist pt with having a BM including transfers. Pt willing to participate in slide board transfer from w/c to drop arm commode with total A with focus on maintaining forward weight shift and having feet maintain contact with the floor.  Pt performed lateral leans to the bed on the left and into the w/c on the right for therapist to assist with clothing management. Pt required A to lean forward and posteriorly for sitting positioning for comfort.  Pt able to void BM on the toilet but unaware of the amount. Pt continued to have an BM with movement but wanted to stop and return to bed. Pt with ability to recall method for laterally leaning to assist caregiver with clothing management to pull them back up in prep for transfer back to bed. Total A slide  board transfer back into bed and max A for sit to supine. Focused on bed mobility including positioning with mod A with extra time. Pt able to recall "hooking" arm on bed rails to assist herself with bed mobility. Pt left in bed to rest and for RN to give enema. Call bed left within reach.   Pt continued to have elevated HR at end of session.     Therapy Documentation Precautions:  Precautions Precautions: Fall Precaution Comments: bilateral ue/le, fear of falling  Restrictions Weight Bearing Restrictions: No     Vital Signs: taken at the end of therapy session Therapy Vitals Temp: 98.3 F (36.8 C) Temp src: Oral Pulse Rate: 127 Resp: 17 BP: 95/66 mmHg Patient Position (if appropriate): Lying Oxygen Therapy SpO2: 100 % O2 Device: None (Room air) Pain: Pain Assessment Pain Score: 9  Pain Location: Leg Pain Orientation: Right;Left Pain Intervention(s): Medication (See eMAR) ADL: ADL ADL Comments: see fim  See FIM for current functional status  Therapy/Group: Individual Therapy  Crystal, Stein [facility] 02/13/2014, 2:10 PM

## 2014-02-13 NOTE — Progress Notes (Signed)
Informed by IV Team Nurse that HD cath is a tunneled permanent catheter. Discontinuance can not be performed by IV Team RN , requires MD or PA to d/c.  Diamantina Monks, RN

## 2014-02-13 NOTE — Plan of Care (Signed)
Problem: RH BLADDER ELIMINATION Goal: RH STG MANAGE BLADDER WITH ASSISTANCE STG Manage Bladder With Assistance. Min A  Urinary catheter in place  Problem: RH PAIN MANAGEMENT Goal: RH STG PAIN MANAGED AT OR BELOW PT'S PAIN GOAL <4  Outcome: Not Progressing Pain remains at 9/10 after pain medication intervention and repositioning

## 2014-02-13 NOTE — Progress Notes (Signed)
Subjective/Complaints: 43 y.o. female with history of depression with anxiety, recent NSTEMI, who was readmitted on 01/16/14 with recurrent chest pain as well as confusion. CTA chest negative for PE and new hepatomegaly and hepatic steatosis noted. UDS positive for marijuana and benzos. Cardiology consulted and felt that symptoms due to psychosocial issues. Patient reported difficulty walking and well as BLE weakness on 07/29 and MRI lumbar spine without disc herniation or stenosis. 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 She was treated with a round of IVIG without much improvement.   Chest pain yesterday. Work up negative. Catheter could not be removed by IV RN. Appears comfortable this am. ROS limited    Objective: Vital Signs: Blood pressure 104/71, pulse 102, temperature 98 F (36.7 C), temperature source Oral, resp. rate 18, height '5\' 4"'  (1.626 m), weight 54 kg (119 lb 0.8 oz), last menstrual period 04/23/2011, SpO2 100.00%. No results found. Results for orders placed during the hospital encounter of 02/01/14 (from the past 72 hour(s))  BASIC METABOLIC PANEL     Status: Abnormal   Collection Time    02/12/14  7:25 AM      Result Value Ref Range   Sodium 137  137 - 147 mEq/L   Potassium 3.9  3.7 - 5.3 mEq/L   Chloride 101  96 - 112 mEq/L   CO2 23  19 - 32 mEq/L   Glucose, Bld 78  70 - 99 mg/dL   BUN 9  6 - 23 mg/dL   Creatinine, Ser 0.34 (*) 0.50 - 1.10 mg/dL   Calcium 9.6  8.4 - 10.5 mg/dL   GFR calc non Af Amer >90  >90 mL/min   GFR calc Af Amer >90  >90 mL/min   Comment: (NOTE)     The eGFR has been calculated using the CKD EPI equation.     This calculation has not been validated in all clinical situations.     eGFR's persistently <90 mL/min signify  possible Chronic Kidney     Disease.   Anion gap 13  5 - 15  CBC     Status: Abnormal   Collection Time    02/12/14  7:25 AM      Result Value Ref Range   WBC 5.4  4.0 - 10.5 K/uL   RBC 2.90 (*) 3.87 - 5.11 MIL/uL   Hemoglobin 9.8 (*) 12.0 - 15.0 g/dL   HCT 28.7 (*) 36.0 - 46.0 %   MCV 99.0  78.0 - 100.0 fL   MCH 33.8  26.0 - 34.0 pg   MCHC 34.1  30.0 - 36.0 g/dL   RDW 14.6  11.5 - 15.5 %   Platelets 310  150 - 400 K/uL  TROPONIN I     Status: None   Collection Time    02/12/14  7:12 PM      Result Value Ref Range   Troponin I <0.30  <0.30 ng/mL   Comment:            Due to the release kinetics of cTnI,     a negative result within the first hours     of the onset of symptoms does not rule out     myocardial infarction with certainty.     If myocardial infarction is  still suspected,     repeat the test at appropriate intervals.  TROPONIN I     Status: None   Collection Time    02/13/14 12:50 AM      Result Value Ref Range   Troponin I <0.30  <0.30 ng/mL   Comment:            Due to the release kinetics of cTnI,     a negative result within the first hours     of the onset of symptoms does not rule out     myocardial infarction with certainty.     If myocardial infarction is still suspected,     repeat the test at appropriate intervals.     HEENT: normal Cardio: tachy and no murmur Resp: CTA B/L and unlabored GI: BS positive and NT,ND Extremity:  Pulses positive and No Edema Skin:   Intact Neuro: 3/5 bilateral deltoids  3 minus/5 bilateral bicep  2 minus/5 bilateral tricep  3 minus/5 left finger flexors  2- grip and wrist extension left , 2- on Right   2 minus/5 right hip flexors   1 left hip flexors   2- Right and 1/5 Left knee extensors  2- to 2+/5 bilateral hip extensors  Trace movement at ankles Decreased sense to PP/LT stocking glove bilateral UE and LE's Psych: flat, slow to initiate    Assessment/Plan: 1. 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 Physiatrist is providing close team supervision and 24 hour management of active medical problems listed below. Physiatrist and rehab team continue to assess barriers to discharge/monitor patient progress toward functional and medical goals. Increasing bilateral finger and wrist extensor strength FIM: FIM - Bathing Bathing Steps Patient Completed: Abdomen;Right Arm;Chest;Left Arm;Front perineal area;Right upper leg;Left upper leg (used wash mit, supine in bed) Bathing: 3: Mod-Patient completes 5-7 66f10 parts or 50-74%  FIM - Upper Body Dressing/Undressing Upper body dressing/undressing steps patient completed: Thread/unthread right bra strap;Thread/unthread left bra strap;Thread/unthread right sleeve of pullover shirt/dresss;Thread/unthread left sleeve of pullover shirt/dress;Put head through opening of pull over shirt/dress Upper body dressing/undressing: 3: Mod-Patient completed 50-74% of tasks FIM - Lower Body Dressing/Undressing Lower body dressing/undressing: 1: Total-Patient completed less than 25% of tasks  FIM - Toileting Toileting: 1: Two helpers (per AJolyne Loa NT report)     FIM - BControl and instrumentation engineerDevices: Sliding board Bed/Chair Transfer: 1: Two helpers  FIM - Locomotion: Wheelchair Distance: 150 x 2 Locomotion: Wheelchair: 1: Total Assistance/staff pushes wheelchair (Pt<25%) FIM - Locomotion: Ambulation Locomotion: Ambulation: 0: Activity did not occur  Comprehension Comprehension Mode: Auditory Comprehension: 5-Follows basic conversation/direction: With no assist  Expression Expression Mode: Verbal Expression: 4-Expresses basic 75 - 89% of the time/requires cueing 10 - 24% of the time. Needs helper to occlude trach/needs to repeat words.  Social Interaction Social Interaction: 5-Interacts  appropriately 90% of the time - Needs monitoring or encouragement for participation or interaction.  Problem Solving Problem Solving: 5-Solves basic 90% of the time/requires cueing < 10% of the time  Memory Memory: 5-Recognizes or recalls 90% of the time/requires cueing < 10% of the time  Medical Problem List and Plan: 1. 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  DVT Prophylaxis/Anticoagulation: Pharmaceutical: Lovenox 3. Neuropathy/Pain Management: Increase Neurontin to 600 mg tid.    -added LOW dose elavil at HS which seems to have helped  -ordered Kpad for right shoulder 4. Bipolar  disorder/Mood:  Ego support, neuropsych assessment. klonopin 5. Neuropsych: This patient is capable of making decisions on her own behalf. 6. Skin/Wound Care: Pressure relief measures to prevent breakdown. Encourage boosting when [place]. Has very low protein stores--added nutritional supplement. 7. GBS: plasmapheresis per nephrology    -remove catheter   8. Hyponatremia:  Improved-- 134 9. Hypokalemia: supplementing, re-check    10. Neurogenic bladder: continue foley due incontinence, generalize weakness.  - Has MRSA---     -contact precautions 11. Mild anemia: recheck CBC     12. Abnormal LFTs: Hepatitis panel negative.    -follow up  13. CV---chest pain yesterday---EKG negative---RN could not remove catheter--   -will  check with renal team today regarding removal  -imdur and metoprolol, holding prn  -HR still elevated due to deconditioning and anxiety  -TEDS/abd binder potentially  -encouraging fluids  - anemic still but hgb stable   LOS (Days) 12 A FACE TO FACE EVALUATION WAS PERFORMED  Gurpreet Mariani T 02/13/2014, 7:52 AM

## 2014-02-13 NOTE — Progress Notes (Signed)
Occupational Therapy Session Note  Patient Details  Name: Crystal Stein MRN: [id number] Date of Birth: [birth year]  Today's Date: 02/13/2014 OT Individual Time: 0900-1005 OT Individual Time Calculation (min): 65 min  Short Term Goals: Week 2:  OT Short Term Goal 1 (Week 2): Pt will feed self 50% of 2 meals with setup and use of right dorsal wrist support with u-cuff and left hand for finger food PRN OT Short Term Goal 2 (Week 2): UB dressing:  Patient will donn pullover shirt sitting EOB with close supervision OT Short Term Goal 3 (Week 2): Grooming:  Patient will perform 3 grooming tasks with min assist while seated EOB or at sink. OT Short Term Goal 4 (Week 2): Toilet transfer:  Max assist slide board transfer to padded tub bench with cut out as a BSC OT Short Term Goal 5 (Week 2): BUEs:  Patient will tolerate/participate in BUE P/AA/AROM exercises at least 3/7 days  Skilled Therapeutic Interventions/Progress Updates:  Patient resting in bed upon arrival attempting to push buttons on her cell phone.  Engaged in self care retraining to include sponge bath, dress and groom tasks.  Focused session on activity tolerance, use of AE, Max cues for guiding caregiver, Max-total cues for recall related to techniques, bed mobility, HOB up><long sit and attempts at circle sit.  Patient expressed on several occasions how exhausted she was and stated, "This is the first time my hands have been numb".  Patient expressed to this OT each time she has seen her---that her hands are numb.   Therapy Documentation Precautions:  Precautions Precautions: Fall Precaution Comments: bilateral ue/le, fear of falling  Restrictions Weight Bearing Restrictions: No Pain: Denies pain and reports numbness in hands and feet ADL: See FIM for current functional status  Therapy/Group: Individual Therapy  Malori Myers 02/13/2014, 8:14 AM

## 2014-02-13 NOTE — Procedures (Signed)
Tunneled right IJ catheter removed in its entirety without immediate complications. Medications utilized- 1% lidocaine to skin and SQ tissues.

## 2014-02-13 NOTE — Progress Notes (Signed)
Recreational Therapy Session Note  Patient Details  Name: JENNESIS RAMASWAMY MRN: [id number] Date of Birth: 05/16/1971 Today's Date: 02/13/2014  Pain: no c/o  Skilled Therapeutic Interventions/Progress Updates: Session focused on relaxation training at bed level.  Pt stating multiple stressors with current diagnosis, but states 'I'm dealing with it, I don't have any other choice" Dung Salinger 02/13/2014, 12:10 PM

## 2014-02-13 NOTE — Progress Notes (Signed)
Physical Therapy Session Note  Patient Details  Name: Crystal Stein MRN: [id number] Date of Birth: 1970/07/15  Today's Date: 02/13/2014 PT Individual Time: 1100-1200 PT Individual Time Calculation (min): 60 min   Short Term Goals: Week 2:  PT Short Term Goal 1 (Week 2): Pt will roll R/L with min A  PT Short Term Goal 2 (Week 2): Pt will maintain unsupported sitting x33min with S PT Short Term Goal 3 (Week 2): Pt will perform sliding board transfer with max A consistently PT Short Term Goal 4 (Week 2): pt will demonstrate R or L lateral lean x 30 seconds with assistance in order to perform pressure relief in sitting  Skilled Therapeutic Interventions/Progress Updates:  1:1. Pt received semi-reclined in bed, very anxious but ready for therapy. Focus this session on activity tolerance, bed mobility and functional transfers. Pt req max A to complete t/f sup>sit EOB w/ HOB flat and use of bed rails, req step-by-step cues for completion. Pt w/ increased anxiety in sitting regarding fear of falling and lightheadedness, cues for relaxation. Pt req total Ax1person for completion of SBT bed>w/c, cues to "stand up" for increased acceptance of weight in B LE. Transfer completed in multiple steps due to onset of cramping in B LE w/ weight acceptance. Pt introduced to steady to target increased WB through B LE as well as target acclimation of BP w/ partial standing by sitting on seat of steady vs. Full. Pt req total Ax2persons to achieve sitting on steady, only able to tolerate for due to anxiety and complaints of lightheadedness. Pt left sitting in w/c at end of session w/ all needs in reach, quick release belt in place.   Therapy Documentation Precautions:  Precautions Precautions: Fall Precaution Comments: bilateral ue/le, fear of falling  Restrictions Weight Bearing Restrictions: No   Pain: Pain Assessment Pain Score: 8   See FIM for current functional status  Therapy/Group: Individual  Therapy  Denzil Hughes 02/13/2014, 12:16 PM

## 2014-02-13 NOTE — Patient Care Conference (Signed)
Inpatient RehabilitationTeam Conference and Plan of Care Update Date: 02/13/2014   Time: 2:00 PM    Patient Name: Crystal Stein      Medical Record Number: [id number]  Date of Birth: 13-May-1971 Sex: Female         Room/Bed: 4W03C/4W03C-01 Payor Info: Payor: /    Admitting Diagnosis: gbs  Admit Date/Time:  02/01/2014  4:00 PM Admission Comments: No comment available   Primary Diagnosis:  <principal problem not specified> Principal Problem: <principal problem not specified>  Patient Active Problem List   Diagnosis Date Noted  . 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 Guillain Barr syndrome 01/21/2014  . Quadriplegia and quadriparesis 01/21/2014  . Bilateral leg weakness 01/20/2014  . Hyperkalemia 01/19/2014  . UTI (urinary tract infection) 01/19/2014  . Hypokalemia 01/18/2014  . 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 Acute encephalopathy 01/17/2014  . Substance abuse 01/17/2014  . Sinus tachycardia 01/17/2014  . Elevated transaminase level 01/17/2014  . Metabolic acidosis 01/17/2014  . Chest pressure 01/16/2014  . Chest pain 01/16/2014  . Tobacco abuse 01/12/2014  . Dyslipidemia 01/12/2014  . NSTEMI (non-ST elevated myocardial infarction) 01/10/2014  . Left ankle sprain 11/28/2012  . Carpal tunnel syndrome of left wrist 11/15/2012  . Left ankle instability 11/15/2012  . Muscle weakness (generalized) 08/19/2012  . Wrist fracture 08/15/2012  . Wrist pain, left 07/01/2012  . Edema of hand 07/01/2012  . Radius distal fracture 06/16/2012  . Ankle sprain 11/04/2011  . OTHER ABNORMAL BLOOD CHEMISTRY 01/22/2009  . LIVER FUNCTION TESTS, ABNORMAL, HX OF 01/16/2009  . ANEMIA 09/13/2008  . DIARRHEA NOS 09/13/2008  . ABDOMINAL PAIN, GENERALIZED 09/13/2008  . ABDOMINAL PAIN, LOWER 09/13/2008  . ABNORMAL TRANSAMINASE, (LFT'S) 09/13/2008  . RUQ PAIN 08/17/2008  . ACUTE SINUSITIS, UNSPECIFIED 07/23/2008  . ACUTE BRONCHITIS 07/23/2008  . LATERAL EPICONDYLITIS 04/25/2008   . CUBITAL TUNNEL SYNDROME 02/07/2008  . Pain in Joint, Upper Arm 02/07/2008  . CLOSED FRACTURE OF SURGICAL NECK OF HUMERUS 02/07/2008  . BIPOLAR DISORDER UNSPECIFIED 12/02/2007  . ANXIETY, CHRONIC 12/02/2007  . NICOTINE ADDICTION 12/02/2007  . NECK PAIN, CHRONIC 12/02/2007    Expected Discharge Date: Expected Discharge Date:  (SNF)  Team Members Present: Physician leading conference: Dr. Faith Rogue Social Worker Present: Amada Jupiter, LCSW;Jenny Prevatt, LCSW Nurse Present: Carlean Purl, RN PT Present: Edman Circle, PT;Jess Quinn Plowman, PT OT Present: Donzetta Kohut, OT;Pleas Patricia, OT SLP Present: Jackalyn Lombard, SLP Other (Discipline and Name): Ottie Glazier, RN [facility]) PPS Coordinator present : Tora Duck, RN, CRRN     Current Status/Progress Goal Weekly Team Focus  Medical   anxiety and pain issues  improve tolerance/cognition  see prior   Bowel/Bladder   Foley in place, Continent of bowel, reports some discomfort with moving bowels. LBM 02/12/14   To be continent of bowel and bladder  Assess for constipation q shift and as needed, assess need for foley to continue   Swallow/Nutrition/ Hydration             ADL's   Mod A-Total overall  3 LTGs downgraded and 2 LTG discharged.  S: self feeding, MIN: bath, UB dress, groom & toilet transfer, MAX: LB dress and toileting  activity tolerance, use of AE to improve independence, guiding own care, BUE function   Mobility   total A of 1 for bed mobility, basic transfers, +2 and use of lift equipment for standing; started am stretching program  LTG downgraded to min A w/c level due to slow progress  all transfers,  standing tolerance, OOB tolerance, change to manual w/c   Communication   moderately severe dysphonia  min cuing to improve speech intelligibility to ~80% accurate at the phrase level   education and use of compensatory strategies for intelligibility    Safety/Cognition/ Behavioral Observations   moderately decreased recall of new information   min cuing   education and use of compensatory strategies for memory    Pain   C/O pain in legs, feet and hands, Oxy IR 5-10mg  q6hrs, Tramadol  q6hrs, Robaxin  q6hrs   Pain less than 3  Assess for and treat pain q shift and prn    Skin   Turn q2hrs, fissure between buttucks perineum red, area open to air at night  No new skin breakdown  Assess skin q shift and prn for skin breakdown and s/s of infection, turn q2hrs    Rehab Goals Patient on target to meet rehab goals: No *See Care Plan and progress notes for long and short-term goals.  Barriers to Discharge: see prior, poor progress    Possible Resolutions to Barriers:  lower level approach    Discharge Planning/Teaching Needs:  plan changed to SNF as family cannot provide needed assistance.  pt and family in agreement with change      Team Discussion:  Poor progress overall this week.  Anxiety and pain complaints still a significant barrier.  Have d/c'd and downgraded several goals. Do not anticipate much more improvement unless participation were to improve.  Team/ MD feel can go ahead and begin SNF bed search as d/c plan has changed to SNF.  Revisions to Treatment Plan:  Downgraded and some d/c'd goals.  Change in d/c plan to SNF.   Continued Need for Acute Rehabilitation Level of Care: The patient requires daily medical management by a physician with specialized training in physical medicine and rehabilitation for the following conditions: Daily direction of a multidisciplinary physical rehabilitation program to ensure safe treatment while eliciting the highest outcome that is of practical value to the patient.: Yes Daily analysis of laboratory values and/or radiology reports with any subsequent need for medication adjustment of medical intervention for : Post surgical problems;Neurological problems;Cardiac problems  Jo Cerone 02/13/2014, 4:05 PM

## 2014-02-14 ENCOUNTER — Encounter (HOSPITAL_COMMUNITY): Payer: Self-pay | Admitting: Occupational Therapy

## 2014-02-14 ENCOUNTER — Inpatient Hospital Stay (HOSPITAL_COMMUNITY): Payer: Self-pay | Admitting: Speech Pathology

## 2014-02-14 ENCOUNTER — Encounter (HOSPITAL_COMMUNITY): Payer: Self-pay | Admitting: *Deleted

## 2014-02-14 ENCOUNTER — Inpatient Hospital Stay (HOSPITAL_COMMUNITY): Payer: Self-pay

## 2014-02-14 ENCOUNTER — Inpatient Hospital Stay (HOSPITAL_COMMUNITY): Payer: Self-pay | Admitting: Occupational Therapy

## 2014-02-14 DIAGNOSIS — G8250 Quadriplegia, unspecified: Secondary | ICD-10-CM

## 2014-02-14 NOTE — Progress Notes (Signed)
Subjective/Complaints: 43 y.o. female with history of depression with anxiety, recent NSTEMI, who was readmitted on 01/16/14 with recurrent chest pain as well as confusion. CTA chest negative for PE and new hepatomegaly and hepatic steatosis noted. UDS positive for marijuana and benzos. Cardiology consulted and felt that symptoms due to psychosocial issues. Patient reported difficulty walking and well as BLE weakness on 07/29 and MRI lumbar spine without disc herniation or stenosis. 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 She was treated with a round of IVIG without much improvement.   Generally sore, tingling/numbness in arms, legs. Chest feels better with catheter out ROS limited    Objective: Vital Signs: Blood pressure 102/76, pulse 120, temperature 98 F (36.7 C), temperature source Oral, resp. rate 18, height _0  (1.626 m), weight 50.1 kg (110 lb 7.2 oz), last menstrual period 04/23/2011, SpO2 97.00%. Ir Removal Tun Cv Cath W/o Fl  02/13/2014   CLINICAL DATA:  Patient with history of Guillain-Barre and prior placement of a tunneled right internal jugular central venous catheter on 01/29/2014 for plasmapheresis. Request is now made for catheter removal.  EXAM:.: EXAM:. REMOVAL TUNNELED CENTRAL VENOUS CATHETER  ANESTHESIA/SEDATION: None  MEDICATIONS: 1% lidocaine to the skin and subcutaneous tissues  CONTRAST:  None  PROCEDURE: Following written informed consent, the patient's right upper hemothorax was prepped and draped in the usual sterile fashion. Sutures were removed . The catheter was then removed and hemostasis was achieved with direct pressure. A Vaseline gauze dressing was applied over the site. The patient tolerated the procedure well.  COMPLICATIONS: None  immediate  FINDINGS: Clean ,intact right internal jugular central venous catheter.  IMPRESSION: Successful right internal jugular tunneled central venous catheter removal.  Read by: Rowe Robert, PA-C   Electronically Signed   By: Sandi Mariscal M.D.   On: 02/13/2014 15:23   Results for orders placed during the hospital encounter of 02/01/14 (from the past 72 hour(s))  BASIC METABOLIC PANEL     Status: Abnormal   Collection Time    02/12/14  7:25 AM      Result Value Ref Range   Sodium 137  137 - 147 mEq/L   Potassium 3.9  3.7 - 5.3 mEq/L   Chloride 101  96 - 112 mEq/L   CO2 23  19 - 32 mEq/L   Glucose, Bld 78  70 - 99 mg/dL   BUN 9  6 - 23 mg/dL   Creatinine, Ser 0.34 (*) 0.50 - 1.10 mg/dL   Calcium 9.6  8.4 - 10.5 mg/dL   GFR calc non Af Amer >90  >90 mL/min   GFR calc Af Amer >90  >90 mL/min   Comment: (NOTE)     The eGFR has been calculated using the CKD EPI equation.     This calculation has not been validated in all clinical situations.     eGFR's persistently <90 mL/min signify possible Chronic Kidney     Disease.   Anion gap 13  5 - 15  CBC     Status: Abnormal   Collection Time    02/12/14  7:25 AM      Result Value Ref Range   WBC 5.4  4.0 - 10.5 K/uL   RBC 2.90 (*) 3.87 - 5.11 MIL/uL   Hemoglobin  9.8 (*) 12.0 - 15.0 g/dL   HCT 28.7 (*) 36.0 - 46.0 %   MCV 99.0  78.0 - 100.0 fL   MCH 33.8  26.0 - 34.0 pg   MCHC 34.1  30.0 - 36.0 g/dL   RDW 14.6  11.5 - 15.5 %   Platelets 310  150 - 400 K/uL  TROPONIN I     Status: None   Collection Time    02/12/14  7:12 PM      Result Value Ref Range   Troponin I <0.30  <0.30 ng/mL   Comment:            Due to the release kinetics of cTnI,     a negative result within the first hours     of the onset of symptoms does not rule out     myocardial infarction with certainty.     If myocardial infarction is still suspected,     repeat the test at appropriate intervals.  TROPONIN I     Status: None   Collection Time    02/13/14  12:50 AM      Result Value Ref Range   Troponin I <0.30  <0.30 ng/mL   Comment:            Due to the release kinetics of cTnI,     a negative result within the first hours     of the onset of symptoms does not rule out     myocardial infarction with certainty.     If myocardial infarction is still suspected,     repeat the test at appropriate intervals.  TROPONIN I     Status: None   Collection Time    02/13/14  8:40 AM      Result Value Ref Range   Troponin I <0.30  <0.30 ng/mL   Comment:            Due to the release kinetics of cTnI,     a negative result within the first hours     of the onset of symptoms does not rule out     myocardial infarction with certainty.     If myocardial infarction is still suspected,     repeat the test at appropriate intervals.    Gen: appears much more alert today HEENT: normal Cardio: tachy and no murmur Resp: CTA B/L and unlabored GI: BS positive and NT,ND Extremity:  Pulses positive and No Edema Skin:   Intact Neuro: 3/5 bilateral deltoids  3 minus/5 bilateral bicep  2 minus/5 bilateral tricep  3 minus/5 left finger flexors  1+ grip and wrist extension left , 1 on Right   2 minus/5 right hip flexors   1 left hip flexors   2- Right and 1/5 Left knee extensors  2- to 2+/5 bilateral hip extensors  Trace movement at ankles persists Decreased sense to PP/LT stocking glove bilateral UE and LE's Psych: flat, slow to initiate    Assessment/Plan: 1. 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 Physiatrist is providing close team supervision and 24 hour management of active medical problems listed below. Physiatrist and rehab team continue to assess barriers to discharge/monitor patient progress toward functional and medical goals. Increasing bilateral finger and wrist extensor strength FIM: FIM - Bathing Bathing  Steps Patient Completed: Abdomen;Right Arm;Chest;Left Arm;Front perineal area;Right upper leg;Left upper leg (used wash mit, supine & HOB  up) Bathing: 3: Mod-Patient completes 5-7 [id number] parts or 50-74%  FIM - Upper Body Dressing/Undressing Upper body dressing/undressing steps patient completed: Thread/unthread right bra strap;Thread/unthread left bra strap;Thread/unthread right sleeve of pullover shirt/dresss;Thread/unthread left sleeve of pullover shirt/dress Upper body dressing/undressing: 3: Mod-Patient completed 50-74% of tasks FIM - Lower Body Dressing/Undressing Lower body dressing/undressing: 1: Total-Patient completed less than 25% of tasks  FIM - Toileting Toileting: 1: Two helpers (per AJolyne Loa NT report)     FIM - BControl and instrumentation engineerDevices: Sliding board;Bed rails Bed/Chair Transfer: 1: Bed > Chair or W/C: Total A (helper does all/Pt. < 25%);2: Supine > Sit: Max A (lifting assist/Pt. 25-49%)  FIM - Locomotion: Wheelchair Distance: 150 x 2 Locomotion: Wheelchair: 1: Total Assistance/staff pushes wheelchair (Pt<25%) FIM - Locomotion: Ambulation Locomotion: Ambulation: 0: Activity did not occur  Comprehension Comprehension Mode: Auditory Comprehension: 5-Follows basic conversation/direction: With no assist  Expression Expression Mode: Verbal Expression: 4-Expresses basic 75 - 89% of the time/requires cueing 10 - 24% of the time. Needs helper to occlude trach/needs to repeat words.  Social Interaction Social Interaction Mode: Not assessed Social Interaction: 5-Interacts appropriately 90% of the time - Needs monitoring or encouragement for participation or interaction.  Problem Solving Problem Solving Mode: Not assessed Problem Solving: 4-Solves basic 75 - 89% of the time/requires cueing 10 - 24% of the time  Memory Memory: 2-Recognizes or recalls 25 - 49% of the time/requires cueing 51 - 75% of the time  Medical Problem List and  Plan: 1. 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  DVT Prophylaxis/Anticoagulation: Pharmaceutical: Lovenox 3. Neuropathy/Pain Management: .    -elavil stopped and neurontin decreased due to neurosedation---appears more alert today  - Kpad for right shoulder  -reiterated to patient that she will need to work through some of this pain given her tolerance of meds.   -i think her pain tolerance will be an ongoing issue however 4. Bipolar disorder/Mood:  Ego support, neuropsych assessment. klonopin 5. Neuropsych: This patient is capable of making decisions on her own behalf. 6. Skin/Wound Care: Pressure relief measures to prevent breakdown. Encourage boosting when ORaymer Has very low protein stores--added nutritional supplement. 7. GBS: plasmapheresis completed per nephrology    -removed catheter   8. Hyponatremia:  Improved-- 134 9. Hypokalemia: supplementing   10. Neurogenic bladder: continue foley due incontinence, generalize weakness.  - Has MRSA---     -contact precautions 11. Mild anemia: recheck CBC     12. Abnormal LFTs: Hepatitis panel negative.    -follow up  13. CV---  -imdur and metoprolol, holding prn  -HR still elevated due to deconditioning and anxiety  -TEDS/abd binder potentially  -encouraging fluids  - anemic still but hgb stable   LOS (Days) 13 A FACE TO FACE EVALUATION WAS PERFORMED  SWARTZ,ZACHARY T 02/14/2014, 7:47 AM

## 2014-02-14 NOTE — Plan of Care (Signed)
Problem: RH BLADDER ELIMINATION Goal: RH STG MANAGE BLADDER WITH ASSISTANCE STG Manage Bladder With Assistance. Min A  Outcome: Not Progressing Foley in place     

## 2014-02-14 NOTE — Progress Notes (Signed)
Speech Language Pathology Daily Session Note  Patient Details  Name: KEARSTYN AVITIA MRN: [id number] Date of Birth: [birth year]  Today's Date: 02/14/2014 SLP Individual Time: 8295-6213 SLP Individual Time Calculation (min): 30 min  Short Term Goals: Week 1: SLP Short Term Goal 1 (Week 1): Patient will demonstrate diaphragmatic breathing with Min verbal cues SLP Short Term Goal 2 (Week 1): Patient will utilize pacing/chunking with speech production at the phrase level with Mod verbal/written cues SLP Short Term Goal 3 (Week 1): Patient will utilize exteral aids to assist with recall of daily information with Min question cues.  Skilled Therapeutic Interventions:  Pt was seen for skilled speech therapy session targeting cognitive-linguistic goals.  Pt upright in wheelchair upon arrival with complaints of pain "all over." Pt exhibited anxiety when trying to recall whether or not she was due for medication and stated that she was "embarrassed" that she couldn't remember when RN brought her pain medications.  SLP initiated skilled education related to use of external aids to facilitate improved recall of new, daily information including having having medication schedule written on white board in room.  RN made aware.  Pt recalled at least 2 details from today's therapy sessions with max assist question cues and use of daily schedule as a written aid.  Overall, pt required mod assist redirection and frequent rest breaks between tasks due to pt being internally distracted by pain.  Pt was noted with significant dysphonia resulting in decreased speech intelligibility at the phrase level; therefore, SLP initiated skilled education related to diaphragmatic breathing and positioning to maximize breath support for speech.  Pt was unable to return demonstration of diaphragmatic breathing exercises secondary to pain and abdominal binder in place.  Continue per current plan of care.    FIM:   Comprehension Comprehension Mode: Auditory Comprehension: 5-Follows basic conversation/direction: With extra time/assistive device Expression Expression Mode: Verbal Expression: 4-Expresses basic 75 - 89% of the time/requires cueing 10 - 24% of the time. Needs helper to occlude trach/needs to repeat words. Social Interaction Social Interaction: 4-Interacts appropriately 75 - 89% of the time - Needs redirection for appropriate language or to initiate interaction. Problem Solving Problem Solving: 4-Solves basic 75 - 89% of the time/requires cueing 10 - 24% of the time Memory Memory: 2-Recognizes or recalls 25 - 49% of the time/requires cueing 51 - 75% of the time  Pain Pain Assessment Pain Assessment: 0-10 Pain Score: 8  Faces Pain Scale: Hurts even more Pain Type: Acute pain Pain Location: Generalized Pain Descriptors / Indicators: Aching Pain Onset: On-going Pain Intervention(s): RN made aware  Therapy/Group: Individual Therapy  Jackalyn Lombard, M.A. CCC-SLP  Valiant Dills, Melanee Spry 02/14/2014, 3:55 PM

## 2014-02-14 NOTE — Progress Notes (Signed)
Occupational Therapy Session Note  Patient Details  Name: Crystal Stein MRN: [id number] Date of Birth: 09/25/1970  Today's Date: 02/14/2014 OT Individual Time: [id number] and 130-200 OT Individual Time Calculation (min): 35 min (Missed 25 min in AM due to pain) and 30 min  Short Term Goals: Week 2:  OT Short Term Goal 1 (Week 2): Pt will feed self 50% of 2 meals with setup and use of right dorsal wrist support with u-cuff and left hand for finger food PRN OT Short Term Goal 2 (Week 2): UB dressing:  Patient will donn pullover shirt sitting EOB with close supervision OT Short Term Goal 3 (Week 2): Grooming:  Patient will perform 3 grooming tasks with min assist while seated EOB or at sink. OT Short Term Goal 4 (Week 2): Toilet transfer:  Max assist slide board transfer to padded tub bench with cut out as a BSC OT Short Term Goal 5 (Week 2): BUEs:  Patient will tolerate/participate in BUE P/AA/AROM exercises at least 3/7 days  Skilled Therapeutic Interventions/Progress Updates:  1)  Patient crying upon arrival stating that she was in so much pain and that her hands and feet were feeling really numb and could not use her hands.  Patient wearing right dorsal wrist support however did not know if she had it on for breakfast or if she had used it for breakfast and was unsure if she had slept in it---she did not think so.   Patient also stating that she did not know what she had done to deserve this "disease and pain" and stated that she just wish "he (God?) would have just taken me cause I can't live like this" After support and encouragement were provided, patient agreed to attempt bathing and dressing at a bed level.  Approximately midway through the session, patient unable to continue due to pain and discomfort as well as stating that her chest hurt.  Patient with RN upon early completion of session.  2)  Patient up in w/c upon arrival stating that she hurt all over and that her buttocks hurt  really bad.  When asked if patient had asked for staff to tilt her chair for pressure relief life she agreed to do and per the note on the wall in front of her as a reminder, she said "I don't think so, I did not know that sign was there for me".  Another sign has been posted in her room that may make this task more clear for patient.  Assisted patient with slide board transfer back to be with NT providing +2 for safety only as patient stated, "I'm not going to be able to assist with the transfer at all because I'm in so much pain and numb all over."  Patient assisted comfortably on her side with all items in reach.  Therapy Documentation Precautions:  Precautions Precautions: Fall Precaution Comments: bilateral ue/le, fear of falling  Restrictions Weight Bearing Restrictions: No Pain: 1) 10/10 all over, premedicated, rest, repositioned and activity, RN aware 2) 10/10 all over, premedicated, rest, repositioned ADL: See FIM for current functional status  Therapy/Group: Individual Therapy both sessions  Jeananne Bedwell 02/14/2014, 8:20 AM

## 2014-02-14 NOTE — Progress Notes (Signed)
Physical Therapy Session Note  Patient Details  Name: Crystal Stein MRN: [id number] Date of Birth: 04-Dec-1970  Today's Date: 02/14/2014 PT Individual Time: 1000-1100 PT Individual Time Calculation (min): 60 min   Short Term Goals: Week 2:  PT Short Term Goal 1 (Week 2): Pt will roll R/L with min A  PT Short Term Goal 2 (Week 2): Pt will maintain unsupported sitting x68min with S PT Short Term Goal 3 (Week 2): Pt will perform sliding board transfer with max A consistently PT Short Term Goal 4 (Week 2): pt will demonstrate R or L lateral lean x 30 seconds with assistance in order to perform pressure relief in sitting  Skilled Therapeutic Interventions/Progress Updates:    Pt received supine in bed, agreeable to participate in therapy after max encouragement. Pt catastrophizing about condition throughout session, repeating "I don't understand why this is happening to me", perseverating on numbness, reporting today she can't feel anything below her chest. Session focused on functional mobility, upright tolerance. After max encouragement and comforting, pt transferred supine>sit w/ MaxA for managing BLE and assisting trunk to come up. Pt w/ squat pivot transfer TotalA bed>w/c. During all mobility pt complaining of pain all over but especially LE. When asked about pain intensity, pt reported it was the same amount of pain as before. Pt moved sit>stand>high perch w/ STEDI lift and +2A to promote increased LE WBing for motor and sensory return. Pt required TotalA to remain standing, MinA to maintain sitting on high perch. High perch>stand>high perch, then high perch>stand>sit in w/c. Pt very anxious and fearful "I'm terrified" during all upright activity. Pt given max reassurance, but continues to perseverate on LE numbness and falling. Pt agreed to remain up in chair through lunch. Pt left seated in w/c w/ all needs within reach. RN made aware.  PT discussed w/ pt's RN about providing educational  materials for pt that she could read each day, may help her anxiety if she has better idea of disease process (impaired memory, so not carrying over much information day to day). RN agreed to provide materials.   Therapy Documentation Precautions:  Precautions Precautions: Fall Precaution Comments: bilateral ue/le, fear of falling  Restrictions Weight Bearing Restrictions: No Vital Signs: Therapy Vitals Pulse Rate: 114 BP: 101/80 mmHg Oxygen Therapy SpO2: 100 % O2 Device: None (Room air) Pain: Pain Assessment Pain Assessment: 0-10 Pain Score: 10-Worst pain ever Pain Type: Acute pain Pain Location: Generalized Pain Descriptors / Indicators: Aching;Numbness Pain Onset: On-going Patients Stated Pain Goal: 4 Pain Intervention(s):  (Pt pre-medicated prior to session)  See FIM for current functional status  Therapy/Group: Individual Therapy  Hosie Spangle Hosie Spangle, PT, DPT 02/14/2014, 12:37 PM

## 2014-02-14 NOTE — Plan of Care (Signed)
Problem: RH PAIN MANAGEMENT Goal: RH STG PAIN MANAGED AT OR BELOW PT'S PAIN GOAL Less than or equal to 6 Outcome: Not Progressing Pain continues to be 9/10 even with medication and heat pad

## 2014-02-15 ENCOUNTER — Encounter (HOSPITAL_COMMUNITY): Payer: Self-pay | Admitting: Occupational Therapy

## 2014-02-15 ENCOUNTER — Inpatient Hospital Stay (HOSPITAL_COMMUNITY): Payer: Self-pay | Admitting: Rehabilitation

## 2014-02-15 ENCOUNTER — Inpatient Hospital Stay (HOSPITAL_COMMUNITY): Payer: Self-pay

## 2014-02-15 ENCOUNTER — Encounter (HOSPITAL_COMMUNITY): Payer: Self-pay | Admitting: *Deleted

## 2014-02-15 NOTE — Progress Notes (Signed)
Occupational Therapy Session Note  Patient Details  Name: Crystal Stein MRN: [id number] Date of Birth: Nov 19, 1970  Today's Date: 02/15/2014 OT Individual Time: 1300-1400 OT Individual Time Calculation (min): 60 min    Short Term Goals: Week 2:  OT Short Term Goal 1 (Week 2): Pt will feed self 50% of 2 meals with setup and use of right dorsal wrist support with u-cuff and left hand for finger food PRN OT Short Term Goal 2 (Week 2): UB dressing:  Patient will donn pullover shirt sitting EOB with close supervision OT Short Term Goal 3 (Week 2): Grooming:  Patient will perform 3 grooming tasks with min assist while seated EOB or at sink. OT Short Term Goal 4 (Week 2): Toilet transfer:  Max assist slide board transfer to padded tub bench with cut out as a BSC OT Short Term Goal 5 (Week 2): BUEs:  Patient will tolerate/participate in BUE P/AA/AROM exercises at least 3/7 days  Skilled Therapeutic Interventions/Progress Updates:    Pt seen for 1:1 OT session with focus on functional transfers, activity tolerance, and BUE AA/AROM. Pt received sitting in w/c. Pt reporting pain "all over" however willing to participate with min cues of encouragement. Completed sliding board transfer bed<>w/c 3x each direction with +2 assist initially and progressing to max-total +1. Pt directed care to verbalize setup of transfer with min cues. Engaged in long sitting, circling sitting, and rolling in bed with min assist. Completed AA/AROM to BUE and BLE while supine in bed. Pt required frequent rest breaks throughout session d/t fatigue and anxiety. Noted that pt's anxiety decreased slightly with repetition of transfers. At end of session pt left supine in bed with all needs in reach.   Therapy Documentation Precautions:  Precautions Precautions: Fall Precaution Comments: bilateral ue/le, fear of falling  Restrictions Weight Bearing Restrictions: No General:   Vital Signs: Therapy Vitals Temp: 98 F (36.7  C) Temp src: Oral Pulse Rate: 125 Resp: 18 BP: 94/72 mmHg Patient Position (if appropriate): Lying Oxygen Therapy SpO2: 98 % O2 Device: None (Room air) Pain:  Pt reports pain "all over" however willing to participate.   See FIM for current functional status  Therapy/Group: Individual Therapy  Daneil Dan 02/15/2014, 3:07 PM

## 2014-02-15 NOTE — Progress Notes (Signed)
Social Work Patient ID: Crystal Stein, female   DOB: [birth year], 43 y.o.   MRN: [id number]  In to see pt today and discuss plans to begin active SNF bed search.  Pt very tearful and crying as she states, "I don't want to go to a nursing home.  Why did this have to happen to me?"  Attempted to provide emotional support and offered chaplain visit as well which she declined, "I don't think they'd want to hear what I would say."  Again, trying to encourage her to self soothe with breathing techniques.  Pt then begins to complain of pain and shaking all over.  Alert NT that she wanted to change positions in bed.  Will check back in with pt tomorrow.  Of note, despite pt not wanting to have to go to NH, she is realistic and agrees to this SW beginning bed search process.  Crystal Johannesen, LCSW

## 2014-02-15 NOTE — Progress Notes (Signed)
Physical Therapy Session Note  Patient Details  Name: Crystal Stein MRN: [id number] Date of Birth: [birth year]  Today's Date: 02/15/2014 PT Individual Time: 1028-1130 PT Individual Time Calculation (min): 62 min   Short Term Goals: Week 2:  PT Short Term Goal 1 (Week 2): Pt will roll R/L with min A  PT Short Term Goal 2 (Week 2): Pt will maintain unsupported sitting x92min with S PT Short Term Goal 3 (Week 2): Pt will perform sliding board transfer with max A consistently PT Short Term Goal 4 (Week 2): pt will demonstrate R or L lateral lean x 30 seconds with assistance in order to perform pressure relief in sitting  Skilled Therapeutic Interventions/Progress Updates:   Pt received lying in bed, stating that she was trying to have a bowel movement.  Note that she was not on bedpan, but trying to go in brief.  Educated that it was not good for skin to sit in bowel and offered to get onto bed pan.  Pt refused, but was willing to participate in session in hopes of increasing motility.  Performed bed mobility (and rolling to don abdominal binder) at max A level with cues for hooking bed rail and flexion of BLEs.  Pt able to actively assist with getting LEs out of bed, but requires max A for trunk.  Once in sitting, educated on pursed lip breathing for relaxation.  Pt feels that she has had bowel movement in brief, therefore encouraged pt to use stedy to get on bedside commode.  Pt agreeable, therefore assisted pt into standing x 2 reps to check bowel movement (note small amount) and to adjust pants.  While on stedy, worked on anterior weight shifts, scapular retraction and anterior pelvic tilts.  Pt able to perform very well with rest breaks in between.  While on bedside commode, pt successful in having large bowel movement.  While sitting on bedside commode, worked on forward weight shift for decreased pressure on sacrum.  Pt tolerated well at min/guard to close S level.  Assisted back into standing  with +2 assist (each time +2, esp from lower bedside commode) but did note pt with active LE assist in quads and glutes for standing.  Assisted into w/c and left tilted at approx 30 deg.  Pt able to verbalize when to call for nurses to boost in chair.  Pt left with all needs in reach.    Therapy Documentation Precautions:  Precautions Precautions: Fall Precaution Comments: bilateral ue/le, fear of falling  Restrictions Weight Bearing Restrictions: No   Pain: Pain Assessment Pain Assessment: 0-10 Pain Score: 4  Pain Type: Chronic pain Pain Location: Generalized Pain Descriptors / Indicators: Aching Pain Frequency: Constant Pain Onset: On-going Patients Stated Pain Goal: 3 Pain Intervention(s): Medication (See eMAR)  See FIM for current functional status  Therapy/Group: Individual Therapy  Vista Deck 02/15/2014, 12:16 PM

## 2014-02-15 NOTE — Progress Notes (Signed)
Subjective/Complaints: 43 y.o. female with history of depression with anxiety, recent NSTEMI, who was readmitted on 01/16/14 with recurrent chest pain as well as confusion. CTA chest negative for PE and new hepatomegaly and hepatic steatosis noted. UDS positive for marijuana and benzos. Cardiology consulted and felt that symptoms due to psychosocial issues. Patient reported difficulty walking and well as BLE weakness on 07/29 and MRI lumbar spine without disc herniation or stenosis. 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 She was treated with a round of IVIG without much improvement.   Pain prominent yesterday ROS limited    Objective: Vital Signs: Blood pressure 95/78, pulse 112, temperature 98 F (36.7 C), temperature source Oral, resp. rate 19, height  (1.626 m), weight 49.8 kg (109 lb 12.6 oz), last menstrual period 04/23/2011, SpO2 97.00%. Ir Removal Tun Cv Cath W/o Fl  02/13/2014   CLINICAL DATA:  Patient with history of Guillain-Barre and prior placement of a tunneled right internal jugular central venous catheter on 01/29/2014 for plasmapheresis. Request is now made for catheter removal.  EXAM:.: EXAM:. REMOVAL TUNNELED CENTRAL VENOUS CATHETER  ANESTHESIA/SEDATION: None  MEDICATIONS: 1% lidocaine to the skin and subcutaneous tissues  CONTRAST:  None  PROCEDURE: Following written informed consent, the patient's right upper hemothorax was prepped and draped in the usual sterile fashion. Sutures were removed . The catheter was then removed and hemostasis was achieved with direct pressure. A Vaseline gauze dressing was applied over the site. The patient tolerated the procedure well.  COMPLICATIONS: None immediate  FINDINGS: Clean ,intact right internal jugular central  venous catheter.  IMPRESSION: Successful right internal jugular tunneled central venous catheter removal.  Read by: Jeananne Rama, PA-C   Electronically Signed   By: Simonne Come M.D.   On: 02/13/2014 15:23   Results for orders placed during the hospital encounter of 02/01/14 (from the past 72 hour(s))  TROPONIN I     Status: None   Collection Time    02/12/14  7:12 PM      Result Value Ref Range   Troponin I <0.30  <0.30 ng/mL   Comment:            Due to the release kinetics of cTnI,     a negative result within the first hours     of the onset of symptoms does not rule out     myocardial infarction with certainty.     If myocardial infarction is still suspected,     repeat the test at appropriate intervals.  TROPONIN I     Status: None   Collection Time    02/13/14 12:50 AM      Result Value Ref Range   Troponin I <0.30  <0.30 ng/mL   Comment:            Due to the release kinetics of cTnI,     a negative result within the first hours     of the onset of symptoms does not rule out     myocardial infarction with certainty.     If myocardial infarction is still suspected,     repeat the test at appropriate intervals.  TROPONIN I     Status: None   Collection Time    02/13/14  8:40 AM  Result Value Ref Range   Troponin I <0.30  <0.30 ng/mL   Comment:            Due to the release kinetics of cTnI,     a negative result within the first hours     of the onset of symptoms does not rule out     myocardial infarction with certainty.     If myocardial infarction is still suspected,     repeat the test at appropriate intervals.    Gen: appears much more alert today HEENT: normal Cardio: tachy and no murmur Resp: CTA B/L and unlabored GI: BS positive and NT,ND Extremity:  Pulses positive and No Edema Skin:   Intact Neuro: 3/5 bilateral deltoids  3 minus/5 bilateral bicep  2 minus/5 bilateral tricep  3 minus/5 left finger flexors  1+ grip and wrist extension left , 1 on  Right   2 minus/5 right hip flexors   1 left hip flexors   2- Right and 1/5 Left knee extensors  2- to 2+/5 bilateral hip extensors  Trace movement at ankles persists Decreased sense to PP/LT stocking glove bilateral UE and LE's Psych: flat, anxious at times    Assessment/Plan: 1. 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 Physiatrist is providing close team supervision and 24 hour management of active medical problems listed below. Physiatrist and rehab team continue to assess barriers to discharge/monitor patient progress toward functional and medical goals. Increasing bilateral finger and wrist extensor strength FIM: FIM - Bathing Bathing Steps Patient Completed: Abdomen;Right Arm;Chest;Left Arm;Front perineal area;Right upper leg;Left upper leg (used wash mit, supine & HOB up) Bathing: 3: Mod-Patient completes 5-7 80f 10 parts or 50-74%  FIM - Upper Body Dressing/Undressing Upper body dressing/undressing steps patient completed: Thread/unthread right bra strap;Thread/unthread left bra strap;Thread/unthread right sleeve of pullover shirt/dresss;Thread/unthread left sleeve of pullover shirt/dress Upper body dressing/undressing: 3: Mod-Patient completed 50-74% of tasks FIM - Lower Body Dressing/Undressing Lower body dressing/undressing: 1: Total-Patient completed less than 25% of tasks  FIM - Toileting Toileting: 1: Two helpers (per Mady Gemma, NT report)     FIM - Banker Devices: Sliding board;Bed rails Bed/Chair Transfer: 2: Supine > Sit: Max A (lifting assist/Pt. 25-49%);1: Bed > Chair or W/C: Total A (helper does all/Pt. < 25%)  FIM - Locomotion: Wheelchair Distance: 150 x 2 Locomotion: Wheelchair: 1: Total Assistance/staff pushes wheelchair (Pt<25%) FIM - Locomotion: Ambulation Locomotion: Ambulation:  0: Activity did not occur  Comprehension Comprehension Mode: Auditory Comprehension: 5-Understands basic 90% of the time/requires cueing < 10% of the time  Expression Expression Mode: Verbal Expression: 4-Expresses basic 75 - 89% of the time/requires cueing 10 - 24% of the time. Needs helper to occlude trach/needs to repeat words.  Social Interaction Social Interaction Mode: Not assessed Social Interaction: 4-Interacts appropriately 75 - 89% of the time - Needs redirection for appropriate language or to initiate interaction.  Problem Solving Problem Solving Mode: Not assessed Problem Solving: 4-Solves basic 75 - 89% of the time/requires cueing 10 - 24% of the time  Memory Memory: 2-Recognizes or recalls 25 - 49% of the time/requires cueing 51 - 75% of the time  Medical Problem List and Plan: 1. 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  DVT Prophylaxis/Anticoagulation: Pharmaceutical: Lovenox 3. Neuropathy/Pain Management: .    -elavil stopped and neurontin decreased due to neurosedation---more alert but pain increased  - Kpad for  right shoulder  -reiterated to patient that she will need to work through some of this pain given her tolerance of meds.     pain tolerance will be an ongoing issue   4. Bipolar disorder/Mood:  Ego support, neuropsych assessment. klonopin 5. Neuropsych: This patient is capable of making decisions on her own behalf. 6. Skin/Wound Care: Pressure relief measures to prevent breakdown. Encourage boosting when OOB. Has very low protein stores--added nutritional supplement. 7. GBS: plasmapheresis completed per nephrology    -removed catheter   8. Hyponatremia:  Improved-- 134 9. Hypokalemia: supplementing   10. Neurogenic bladder: continue foley due incontinence, generalize weakness.  - Has MRSA---     -contact precautions 11. Mild anemia: recheck CBC     12. Abnormal LFTs: Hepatitis panel negative.    -follow up  13. CV---  -imdur and metoprolol, holding  prn  -HR still elevated due to deconditioning and anxiety  -TEDS/abd binder potentially  -encouraging fluids  - anemic still but hgb stable   LOS (Days) 14 A FACE TO FACE EVALUATION WAS PERFORMED  SWARTZ,ZACHARY T 02/15/2014, 7:47 AM

## 2014-02-15 NOTE — Progress Notes (Signed)
Occupational Therapy Session Note  Patient Details  Name: Crystal Stein MRN: [id number] Date of Birth: [birth year]  Today's Date: 02/15/2014 OT Individual Time: 0900-1000 OT Individual Time Calculation (min): 60 min   Short Term Goals: Week 2:  OT Short Term Goal 1 (Week 2): Pt will feed self 50% of 2 meals with setup and use of right dorsal wrist support with u-cuff and left hand for finger food PRN OT Short Term Goal 2 (Week 2): UB dressing:  Patient will donn pullover shirt sitting EOB with close supervision OT Short Term Goal 3 (Week 2): Grooming:  Patient will perform 3 grooming tasks with min assist while seated EOB or at sink. OT Short Term Goal 4 (Week 2): Toilet transfer:  Max assist slide board transfer to padded tub bench with cut out as a BSC OT Short Term Goal 5 (Week 2): BUEs:  Patient will tolerate/participate in BUE P/AA/AROM exercises at least 3/7 days  Skilled Therapeutic Interventions/Progress Updates:  Patient resting in bed.  NT pulled this OT aside prior to entering the room and reported that patient refused to eat breakfast with NT assisting and that patient refused the rehab tech who was scheduled to stretch patient.  Patient states that she did not recall the refusals and agreed to feed herself breakfast with OT providing set up and VCs PRN.  Once patient was positioned upright in the bed with a pillow behind her back she was able to feed herself using right dorsal wrist support with bent spoon in the u-cuff and drink from mug with snap on lid, handle and straw.  Following breakfast, patient was assisted to don brief and perform dressing.  Focused session on bed mobility to include long sitting and rolling, functional use of BUEs, core muscle strengthening, and activity tolerance.  Yesterday, patient was given printed information about GBS per her request and she stated that she really wanted to read about the syndrome and either she would read it or have her father read  it to her.  This morning she did not recall if she had read it and also did not recall if nursing assisted her with an evening bath last night.  As of today, patient has been changed to evening bath with nursing until she has more endurance and less pain.  Therapy Documentation Precautions:  Precautions Precautions: Fall Precaution Comments: bilateral ue/le, fear of falling  Restrictions Weight Bearing Restrictions: No Pain: Prior to activity patient reports 3/10 then 10/10 "all over" midway through session. ADL: See FIM for current functional status  Therapy/Group: Individual Therapy  Goebel Hellums 02/15/2014, 1:38 PM

## 2014-02-15 NOTE — Plan of Care (Signed)
Problem: RH BLADDER ELIMINATION Goal: RH STG MANAGE BLADDER WITH ASSISTANCE STG Manage Bladder With Assistance. Min A  Outcome: Not Progressing Patient with foley catheter due to acute urinary retention

## 2014-02-16 ENCOUNTER — Inpatient Hospital Stay (HOSPITAL_COMMUNITY): Payer: Medicaid Other | Admitting: Speech Pathology

## 2014-02-16 ENCOUNTER — Encounter (HOSPITAL_COMMUNITY): Payer: Self-pay | Admitting: *Deleted

## 2014-02-16 ENCOUNTER — Encounter (HOSPITAL_COMMUNITY): Payer: Self-pay | Admitting: Occupational Therapy

## 2014-02-16 ENCOUNTER — Inpatient Hospital Stay (HOSPITAL_COMMUNITY): Payer: Self-pay

## 2014-02-16 MED ORDER — AMITRIPTYLINE HCL 10 MG PO TABS
10.0000 mg | ORAL_TABLET | Freq: Every day | ORAL | Status: DC
  Administered 2014-02-16 – 2014-02-18 (×3): 10 mg via ORAL
  Filled 2014-02-16 (×4): qty 1

## 2014-02-16 MED ORDER — ALPRAZOLAM 0.5 MG PO TABS
0.5000 mg | ORAL_TABLET | Freq: Once | ORAL | Status: AC
  Administered 2014-02-16: 0.5 mg via ORAL

## 2014-02-16 NOTE — Progress Notes (Signed)
Physical Therapy Session Note  Patient Details  Name: Crystal Stein MRN: [id number] Date of Birth: [birth year]  Today's Date: 02/16/2014 PT Individual Time: 1300-1400 PT Individual Time Calculation (min): 60 min   Short Term Goals: Week 2:  PT Short Term Goal 1 (Week 2): Pt will roll R/L with min A  PT Short Term Goal 2 (Week 2): Pt will maintain unsupported sitting x55min with S PT Short Term Goal 3 (Week 2): Pt will perform sliding board transfer with max A consistently PT Short Term Goal 4 (Week 2): pt will demonstrate R or L lateral lean x 30 seconds with assistance in order to perform pressure relief in sitting  Skilled Therapeutic Interventions/Progress Updates:   Session focused on functional SB transfers with emphasis on pt recalling sequence needed (mod verbal cues), dynamic sitting balance, bed mobility for supine <-> sit with cues for technique and min to mod A needed on flat surface, weightbearing through LUE during mobility, lateral leans down to R and L elbow x 5 reps each, and overall endurance/activity tolerance. Pt continues to require encouragement and reassurance throughout session. Pt's transfers with SB varied from min A to max A (increased A needed with fatigue when going up hill back to bed in pt's room) with cues for hand placement, foot placement, and head/hips relationship. Also used block underneath feet during transfer for improved WB through BLE.   Therapy Documentation Precautions:  Precautions Precautions: Fall Precaution Comments: bilateral ue/le, fear of falling  Restrictions Weight Bearing Restrictions: No  Pain: Reports tingling in arms and legs with occasional pain in LLE with mobility - repositioned and rest breaks as needed.  See FIM for current functional status  Therapy/Group: Individual Therapy  Karolee Stamps [facility] 02/16/2014, 3:23 PM

## 2014-02-16 NOTE — Progress Notes (Signed)
NUTRITION FOLLOW-UP  Pt meets criteria for NON-SEVERE (MODERATE) MALNUTRITION in the context of chronic illness as evidenced by moderate fat and moderate-severe muscle mass depletion.  DOCUMENTATION CODES Per approved criteria  -Non-severe (moderate) malnutrition in the context of chronic illness   INTERVENTION: Continue Ensure Complete po TID, each supplement provides 350 kcal and 13 grams of protein.  Encourage PO intake.  NUTRITION DIAGNOSIS: Inadequate oral intake related to difficulties chewing as evidenced by meal completion of 50-70%; Resolved.  NEW NUTRITION Dx: Increased nutrient needs related to muscle wasting as evidenced by estimated nutrition needs.  Goal: Pt to meet >/= 90% of their estimated nutrition needs; met  Monitor:  PO intake, weight trends, labs, I/O's  43 y.o. female  Admitting Dx: Rosalee Kaufman syndrome  ASSESSMENT: 43 y.o. female with history of depression with anxiety, recent NSTEMI, who was readmitted on 01/16/14 with recurrent chest pain as well as confusion. 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  8/21-Pt reports having a good appetite. Meal completion is 50-100%. Pt reports she has been having difficulties chewing her food as she has dentures and am not able to eat tough foods. Will downgrade diet to dysphagia 3 diet. Pt also reports her appetite frequently comes and goes due to when she has nerve pain or stress. During the time her appetite is down, pt tends to have weight loss. Pt reports her usual body weight is 110 lbs. Lately she reports her appetite has been good with no recent significant weight loss. Pt reports she usually drinks Ensure at home 2 to 3 times a day to help prevent further weight loss.   8/28- Pt reports having a good appetite. Meal completion is 50-100%. Pt reports she has also been drinking her Ensure and enjoys it. Pt was encouraged to continue eating her food at meals and to drink  her oral supplements.   Height: Ht Readings from Last 1 Encounters:  02/01/14 '5\' 4"'  (1.626 m)    Weight: Wt Readings from Last 1 Encounters:  02/16/14 109 lb 5.6 oz (49.6 kg)  01/31/14 113 lbs  BMI:  Body mass index is 18.76 kg/(m^2).  Re-Estimated Nutritional Needs: Kcal: 1700-1900 Protein: 70-85 grams  Fluid: 1.7 - 1.9 L/day  Skin: no issues noted  Diet Order: Dysphagia 3   Intake/Output Summary (Last 24 hours) at 02/16/14 1431 Last data filed at 02/16/14 1400  Gross per 24 hour  Intake    840 ml  Output    751 ml  Net     89 ml    Last BM: 8/27  Labs:   Recent Labs Lab 02/09/14 1647 02/12/14 0725  NA 129* 137  K 3.5* 3.9  CL 109 101  CO2  --  23  BUN 8 9  CREATININE 0.30* 0.34*  CALCIUM  --  9.6  GLUCOSE 96 78    CBG (last 3)  No results found for this basename: GLUCAP,  in the last 72 hours  Scheduled Meds: . ALPRAZolam  0.5 mg Oral TID  . amitriptyline  10 mg Oral QHS  . antiseptic oral rinse  7 mL Mouth Rinse BID  . calcium gluconate  2 g Intravenous Once  . enoxaparin (LOVENOX) injection  40 mg Subcutaneous Q24H  . feeding supplement (ENSURE COMPLETE)  237 mL Oral TID BM  . gabapentin  300 mg Oral TID  . heparin  1,000 Units Intracatheter Once  . levothyroxine  25 mcg Oral QAC breakfast  .  nitrofurantoin (macrocrystal-monohydrate)  100 mg Oral Q12H  . pantoprazole  40 mg Oral Daily  . polyethylene glycol  17 g Oral QHS  . potassium chloride  20 mEq Oral Daily  . traZODone  50 mg Oral QHS    Continuous Infusions: . citrate dextrose      Past Medical History  Diagnosis Date  . Anxiety   . Depression   . Asthma   . 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 COPD (chronic obstructive pulmonary disease)   . GERD (gastroesophageal reflux disease)   . Bipolar disorder   . Blood transfusion 2006  . History of cardiac catheterization July 2015    Angiographically normal coronary arteries with distal tortuosity  . NSTEMI  (non-ST elevated myocardial infarction)     Not secondary to obstructive CAD or obvious plaque rupture, possibly vasospasm    Past Surgical History  Procedure Laterality Date  . Tennis elbow release  04/27/06    Left; [facility], [facility]  . Multiple tooth extractions  10/07    Full upper  . Orif proximal humerus fracture  08/06    Left,  . Dilation and curettage of uterus  June 2012    Failed ablation  . Tubal ligation  June 2012    [facility]  . Bipolar disorder    . Hysteroscopy  01/27/2011    Procedure: HYSTEROSCOPY;  Surgeon: Jonnie Kind, MD;  Location: [facility];  Service: Gynecology;  Laterality: N/A;  . Vaginal hysterectomy  05/19/2011    Procedure: HYSTERECTOMY VAGINAL;  Surgeon: Jonnie Kind, MD;  Location: [facility];  Service: Gynecology;  Laterality: N/A;  . Orif wrist fracture  06/17/2012    Procedure: OPEN REDUCTION INTERNAL FIXATION (ORIF) WRIST FRACTURE;  Surgeon: Carole Civil, MD;  Location: [facility];  Service: Orthopedics;  Laterality: Left;  . Carpal tunnel release  06/17/2012    Procedure: CARPAL TUNNEL RELEASE;  Surgeon: Carole Civil, MD;  Location: [facility];  Service: Orthopedics;  Laterality: Left;    Kallie Locks, MS, Provisional LDN Pager # [phone] After hours/ weekend pager # [phone]

## 2014-02-16 NOTE — Progress Notes (Signed)
Chart and note reviewed. Agree with note.  Kimberly Harris, RD, LDN, CNSC Pager [phone] After Hours Pager [phone]   

## 2014-02-16 NOTE — Progress Notes (Signed)
Physical Therapy Weekly Progress Note  Patient Details  Name: Crystal Stein MRN: [id number] Date of Birth: Sep 01, 1970  Beginning of progress report period: February 10, 2014 End of progress report period: February 16, 2014   Patient has met 4 of 4 short term goals. Pt is making slow progress while here on CIR. Pt's anxiety and decreased memory impacts mobility and ability to make significant progress. Pt continues to require up to max A for transfers using SB and requires frequent encouragement throughout sessions due to anxiety and fearfulness. Pt is currently in tilt in space w/c to aid with positioning and pressure relief (dependent) but plan to work towards manual w/c for ability to work on w/c propulsion. Plan is now to d/c to SNF due to lack of support for her needs.   Patient continues to demonstrate the following deficits: decreased functional mobility, decreased sensation and proprioception, decreased strength, decreased endurance, pain, decreased functional use of BUE and BLE, decreased ROM and therefore will continue to benefit from skilled PT intervention to enhance overall performance with activity tolerance, balance, postural control, ability to compensate for deficits, functional use of  right upper extremity, right lower extremity, left upper extremity and left lower extremity, coordination and cognition.  Patient progressing toward long term goals..  Plan of care revisions: Goals were downgraded this week due to change in D/c Plan to SNF.  PT Short Term Goals Week 2:  PT Short Term Goal 1 (Week 2): Pt will roll R/L with min A  PT Short Term Goal 1 - Progress (Week 2): Met PT Short Term Goal 2 (Week 2): Pt will maintain unsupported sitting x11mn with S PT Short Term Goal 2 - Progress (Week 2): Met PT Short Term Goal 3 (Week 2): Pt will perform sliding board transfer with max A consistently PT Short Term Goal 3 - Progress (Week 2): Met PT Short Term Goal 4 (Week 2): pt will  demonstrate R or L lateral lean x 30 seconds with assistance in order to perform pressure relief in sitting PT Short Term Goal 4 - Progress (Week 2): Met Week 3:  PT Short Term Goal 1 (Week 3): = LTGs (SNF Placement)  Skilled Therapeutic Interventions/Progress Updates:  Ambulation/gait training;Balance/vestibular training;Cognitive remediation/compensation;Discharge planning;Community reintegration;DME/adaptive equipment instruction;Functional electrical stimulation;Functional mobility training;Patient/family education;Pain management;Neuromuscular re-education;Psychosocial support;Splinting/orthotics;Therapeutic Exercise;Therapeutic Activities;Stair training;UE/LE Strength taining/ROM;UE/LE Coordination activities;Wheelchair propulsion/positioning;Skin care/wound management   Therapy Documentation Precautions:  Precautions Precautions: Fall Precaution Comments: bilateral ue/le, fear of falling  Restrictions Weight Bearing Restrictions: No  See FIM for current functional status  GCanary BrimBAsante [facility]8/28/2015, 3:25 PM

## 2014-02-16 NOTE — Progress Notes (Signed)
Occupational Therapy Weekly Progress Note  Patient Details  Name: Crystal Stein MRN: [id number] Date of Birth: 1970-07-27  Beginning of progress report period: February 09, 2014 End of progress report period: February 19, 2014  Patient is making slow progress on CIR, but able to met 5/5 STGs. Patient continues to be limited by pain and low BP symptoms. Patient wears bilateral thigh high TEDs and abdominal binder during mobility. Patient is now on evening bath and normally engages in dressing tasks during OT morning session. Patient requires total assist for LB dressing, min>max assist with bed mobility, supervision to don shirt while seated EOB, max assist for functional slide board transfers, and up to min assist for grooming tasks while seated in w/c. Patient continues to be tearful at times and presents with poor memory and carryover from session to session.   Patient continues to demonstrate the following deficits: decreased independence with BADLs, decreased independence with functional mobility/transfers, decreased functional use of BUEs, decreased overall activity tolerance/endurance, increased anxiety, decreased memory. Therefore, patient will continue to benefit from skilled OT intervention to enhance overall performance with BADL and Reduce care partner burden.  Patient progressing toward long term goals..  Continue plan of care.  OT Short Term Goals Week 1:  OT Short Term Goal 1 (Week 1): Pt will feed self 50% of  meal with setup OT Short Term Goal 1 - Progress (Week 1): Progressing toward goal OT Short Term Goal 2 (Week 1): pt will don shirt with min a  OT Short Term Goal 2 - Progress (Week 1): Met (HOB up and long sit in bed) OT Short Term Goal 3 (Week 1): pt will tolerate sitting eob for one adl task with steady assist for 5 min OT Short Term Goal 3 - Progress (Week 1): Progressing toward goal (met before began having OH issues 8/20 & now using THT & abdominal binder) OT Short Term  Goal 4 (Week 1): pt will roll in bed with min a with bed rail for LB clothing management OT Short Term Goal 4 - Progress (Week 1): Met  Week 2:  OT Short Term Goal 1 (Week 2): Pt will feed self 50% of 2 meals with setup and use of right dorsal wrist support with u-cuff and left hand for finger food PRN OT Short Term Goal 1 - Progress (Week 2): Met OT Short Term Goal 2 (Week 2): UB dressing:  Patient will donn pullover shirt sitting EOB with close supervision OT Short Term Goal 2 - Progress (Week 2): Met OT Short Term Goal 3 (Week 2): Grooming:  Patient will perform 3 grooming tasks with min assist while seated EOB or at sink. OT Short Term Goal 3 - Progress (Week 2): Met OT Short Term Goal 4 (Week 2): Toilet transfer:  Max assist slide board transfer to padded tub bench with cut out as a BSC OT Short Term Goal 4 - Progress (Week 2): Met OT Short Term Goal 5 (Week 2): BUEs:  Patient will tolerate/participate in BUE P/AA/AROM exercises at least 3/7 days OT Short Term Goal 5 - Progress (Week 2): Progressing toward goal  Week 3:  OT Short Term Goal 1 (Week 3): STGs=LTGs due to ELOS (pending SNF placement)  Skilled Therapeutic Interventions/Progress Updates:  Balance/vestibular training;Community reintegration;Discharge planning;Disease mangement/prevention;DME/adaptive equipment instruction;Functional electrical stimulation;Functional mobility training;Pain management;Neuromuscular re-education;Self Care/advanced ADL retraining;Psychosocial support;Patient/family education;Skin care/wound managment;Splinting/orthotics;Therapeutic Activities;Therapeutic Exercise;UE/LE Coordination activities;UE/LE Strength taining/ROM;Wheelchair propulsion/positioning;Cognitive remediation/compensation   Precautions:  Precautions Precautions: Fall Precaution Comments: bilateral ue/le, fear  of falling  Restrictions Weight Bearing Restrictions: No  See FIM for current functional status  Merced Hanners 02/16/2014,  3:36 PM

## 2014-02-16 NOTE — Progress Notes (Signed)
Subjective/Complaints: 43 y.o. female with history of depression with anxiety, recent NSTEMI, who was readmitted on 01/16/14 with recurrent chest pain as well as confusion. CTA chest negative for PE and new hepatomegaly and hepatic steatosis noted. UDS positive for marijuana and benzos. Cardiology consulted and felt that symptoms due to psychosocial issues. Patient reported difficulty walking and well as BLE weakness on 07/29 and MRI lumbar spine without disc herniation or stenosis. 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 She was treated with a round of IVIG without much improvement.   Didn't sleep well. More awake. Still complains of hands being numb. ROS limited    Objective: Vital Signs: Blood pressure 115/65, pulse 120, temperature 98.2 F (36.8 C), temperature source Oral, resp. rate 17, height  (1.626 m), weight 49.6 kg (109 lb 5.6 oz), last menstrual period 04/23/2011, SpO2 99.00%. No results found. Results for orders placed during the hospital encounter of 02/01/14 (from the past 72 hour(s))  TROPONIN I     Status: None   Collection Time    02/13/14  8:40 AM      Result Value Ref Range   Troponin I <0.30  <0.30 ng/mL   Comment:            Due to the release kinetics of cTnI,     a negative result within the first hours     of the onset of symptoms does not rule out     myocardial infarction with certainty.     If myocardial infarction is still suspected,     repeat the test at appropriate intervals.    Gen: appears much more alert today HEENT: normal Cardio: tachy and no murmur Resp: CTA B/L and unlabored GI: BS positive and NT,ND Extremity:  Pulses positive and No Edema Skin:   Intact Neuro: 3/5 bilateral deltoids  3 minus/5 bilateral bicep   2 minus/5 bilateral tricep  3 minus/5 left finger flexors  1+ grip and wrist extension left , 1 on Right   2 minus/5 right hip flexors   1 left hip flexors   2- Right and 1/5 Left knee extensors  2- to 2+/5 bilateral hip extensors  Trace movement at ankles persists Decreased sense to PP/LT stocking glove bilateral UE and LE's Psych: flat, anxious at times    Assessment/Plan: 1. 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 Physiatrist is providing close team supervision and 24 hour management of active medical problems listed below. Physiatrist and rehab team continue to assess barriers to discharge/monitor patient progress toward functional and medical goals.  Having continued discussions with patient regarding pain, tolerance of meds, etc   FIM: FIM - Bathing Bathing Steps Patient Completed: Abdomen;Right Arm;Chest;Left Arm;Front perineal area;Right upper leg;Left upper leg (used wash mit, supine & HOB up) Bathing: 3: Mod-Patient completes 5-7 65f 10 parts or 50-74%  FIM - Upper Body Dressing/Undressing Upper body dressing/undressing steps patient completed: Thread/unthread right bra strap;Thread/unthread left bra strap;Thread/unthread right sleeve of pullover shirt/dresss;Thread/unthread left sleeve of pullover shirt/dress Upper body dressing/undressing: 3: Mod-Patient completed 50-74% of tasks FIM - Lower Body Dressing/Undressing Lower body dressing/undressing: 1: Total-Patient completed less than 25% of tasks  FIM - Toileting Toileting: 1:  Two helpers (per Mady Gemma, NT report)  FIM - Toilet Transfers Toilet Transfers Assistive Devices: Bedside commode (and use of stedy) Toilet Transfers: 1-Two helpers  FIM - Banker Devices:  (stedy) Bed/Chair Transfer: 1: Two helpers;2: Supine > Sit: Max A (lifting  assist/Pt. 25-49%)  FIM - Locomotion: Wheelchair Distance: 150 x 2 Locomotion: Wheelchair: 0: Activity did not occur FIM - Locomotion: Ambulation Locomotion: Ambulation: 0: Activity did not occur  Comprehension Comprehension Mode: Auditory Comprehension: 5-Follows basic conversation/direction: With no assist  Expression Expression Mode: Verbal Expression: 4-Expresses basic 75 - 89% of the time/requires cueing 10 - 24% of the time. Needs helper to occlude trach/needs to repeat words.  Social Interaction Social Interaction Mode: Not assessed Social Interaction: 4-Interacts appropriately 75 - 89% of the time - Needs redirection for appropriate language or to initiate interaction.  Problem Solving Problem Solving Mode: Not assessed Problem Solving: 4-Solves basic 75 - 89% of the time/requires cueing 10 - 24% of the time  Memory Memory: 2-Recognizes or recalls 25 - 49% of the time/requires cueing 51 - 75% of the time  Medical Problem List and Plan: 1. 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  DVT Prophylaxis/Anticoagulation: Pharmaceutical: Lovenox 3. Neuropathy/Pain Management: .    -elavil stopped and neurontin decreased due to neurosedation---more alert but pain increased  -will resume LOW dose elavil tonight to see if we can help her sleep/neuro pain a bit more  - Kpad for right shoulder  -reiterated to patient that she will need to work through some of this pain given her tolerance of meds.     pain tolerance will be an ongoing issue   4. Bipolar disorder/Mood:  Ego support, neuropsych assessment. klonopin 5. Neuropsych: This patient is capable of making decisions on her own behalf. 6. Skin/Wound Care: Pressure relief measures to prevent breakdown. Encourage boosting when OOB. Has very low protein stores--added nutritional supplement. 7. GBS: plasmapheresis completed per nephrology      8. Hyponatremia:  Improved-- 134 9. Hypokalemia: supplementing   10. Neurogenic bladder:  continue foley due incontinence, generalize weakness.  - Has MRSA---     -contact precautions 11. Mild anemia: recheck CBC     12. Abnormal LFTs: Hepatitis panel negative.    -follow up  13. CV---  -imdur and metoprolol, holding prn  -HR still elevated due to deconditioning and anxiety  -TEDS/abd binder potentially  -encouraging fluids  - anemic still but hgb stable   LOS (Days) 15 A FACE TO FACE EVALUATION WAS PERFORMED  Hendrix Yurkovich T 02/16/2014, 8:03 AM

## 2014-02-16 NOTE — Progress Notes (Signed)
Speech Language Pathology Daily Session Note  Patient Details  Name: Crystal Stein MRN: [id number] Date of Birth: 01-09-1971  Today's Date: 02/16/2014 SLP Individual Time: 1130-1200 SLP Individual Time Calculation (min): 30 min  Short Term Goals: Week 1: SLP Short Term Goal 1 (Week 1): Patient will demonstrate diaphragmatic breathing with Min verbal cues SLP Short Term Goal 2 (Week 1): Patient will utilize pacing/chunking with speech production at the phrase level with Mod verbal/written cues SLP Short Term Goal 3 (Week 1): Patient will utilize exteral aids to assist with recall of daily information with Min question cues.  Skilled Therapeutic Interventions: Skilled treatment session focused on cognitive-linguistic goals. Upon arrival, pt was upright in chair. Pt was noted with significant dysphonia resulting in decreased speech intelligibility at the phrase level; therefore, SLP initiated skilled education related to diaphragmatic breathing and positioning to maximize breath support for speech. Pt was unable to return demonstration of diaphragmatic breathing exercises despite total A multimodal cues. Pt appeared anxious throughout the session and was asking appropriate questions in regards to memory impairments. SLP facilitated education with functional conversation in regards to possible reasons for impaired recall of new information and strategies such as external memory aids to increase recall/carryover. Patient verbalized understanding and reported baseline memory impairments in which she utilized a notebook to write down important information. Patient also educated this clinician on memory aids in her room that she utilizes for pressure relief with Mod I. Patient left in room with quick release belt in place and all needs within reach. Continue with current plan of care.    FIM:  Comprehension Comprehension Mode: Auditory Comprehension: 5-Follows basic conversation/direction: With no  assist Expression Expression Mode: Verbal Expression: 4-Expresses basic 75 - 89% of the time/requires cueing 10 - 24% of the time. Needs helper to occlude trach/needs to repeat words. Social Interaction Social Interaction: 4-Interacts appropriately 75 - 89% of the time - Needs redirection for appropriate language or to initiate interaction. Problem Solving Problem Solving: 4-Solves basic 75 - 89% of the time/requires cueing 10 - 24% of the time Memory Memory: 2-Recognizes or recalls 25 - 49% of the time/requires cueing 51 - 75% of the time  Pain Did not report any pain throughout the session  Therapy/Group: Individual Therapy  Temitope Griffing 02/16/2014, 4:10 PM

## 2014-02-16 NOTE — Progress Notes (Signed)
Physical Therapy Session Note  Patient Details  Name: Crystal Stein MRN: [id number] Date of Birth: 1971-05-23  Today's Date: 02/16/2014 PT Individual Time: 1100-1130 PT Individual Time Calculation (min): 30 min   Short Term Goals: Week 1:  PT Short Term Goal 1 (Week 1): pt will roll R with min assist after set-up PT Short Term Goal 1 - Progress (Week 1): Progressing toward goal PT Short Term Goal 2 (Week 1): pt will transfer bed > w/c with assist of 1 person consistently PT Short Term Goal 2 - Progress (Week 1): Progressing toward goal PT Short Term Goal 3 (Week 1): pt will tolerate sitting up in w/c x 2 hours at least once per day PT Short Term Goal 3 - Progress (Week 1): Progressing toward goal PT Short Term Goal 4 (Week 1): pt will demonstrate R or L lateral lean x 30 seconds with assistance in order to perform pressure relief in sitting PT Short Term Goal 4 - Progress (Week 1): Progressing toward goal Week 2:  PT Short Term Goal 1 (Week 2): Pt will roll R/L with min A  PT Short Term Goal 2 (Week 2): Pt will maintain unsupported sitting x37min with S PT Short Term Goal 3 (Week 2): Pt will perform sliding board transfer with max A consistently PT Short Term Goal 4 (Week 2): pt will demonstrate R or L lateral lean x 30 seconds with assistance in order to perform pressure relief in sitting  Skilled Therapeutic Interventions/Progress Updates:   Pt presents up in w/c with c/o of feeling light headed this morning. BP taken = 123/84 bpm and HR 114 bpm. Encouraged pursed lip breathing throughout session which improved symptoms per patient report. Focused on education for pressure relief techniques in w/c (worked on anterior weightshift coming forward on elbows), postural exercise for scapular retraction x 2 sets of 10 reps with cues for correct technique seated edge of w/c with feet on the floor for proprioceptive input, and functional use of UE's for manipulation of personal items on tray  table. Left with SLP for next session.  Therapy Documentation Precautions:  Precautions Precautions: Fall Precaution Comments: bilateral ue/le, fear of falling  Restrictions Weight Bearing Restrictions: No  Pain: Reports pain is "the same as usual."  See FIM for current functional status  Therapy/Group: Individual Therapy  Karolee Stamps [facility] 02/16/2014, 12:58 PM

## 2014-02-16 NOTE — Progress Notes (Signed)
Occupational Therapy Session Note  Patient Details  Name: Crystal Stein MRN: [id number] Date of Birth: 1970/06/26  Today's Date: 02/16/2014 OT Individual Time: 7062-3762 OT Individual Time Calculation (min): 60 min   Short Term Goals: Week 1:  OT Short Term Goal 1 (Week 1): Pt will feed self 50% of  meal with setup OT Short Term Goal 1 - Progress (Week 1): Progressing toward goal OT Short Term Goal 2 (Week 1): pt will don shirt with min a  OT Short Term Goal 2 - Progress (Week 1): Met (HOB up and long sit in bed) OT Short Term Goal 3 (Week 1): pt will tolerate sitting eob for one adl task with steady assist for 5 min OT Short Term Goal 3 - Progress (Week 1): Progressing toward goal (met before began having OH issues 8/20 & now using THT & abdominal binder) OT Short Term Goal 4 (Week 1): pt will roll in bed with min a with bed rail for LB clothing management OT Short Term Goal 4 - Progress (Week 1): Met  Week 2:  OT Short Term Goal 1 (Week 2): Pt will feed self 50% of 2 meals with setup and use of right dorsal wrist support with u-cuff and left hand for finger food PRN OT Short Term Goal 2 (Week 2): UB dressing:  Patient will donn pullover shirt sitting EOB with close supervision OT Short Term Goal 3 (Week 2): Grooming:  Patient will perform 3 grooming tasks with min assist while seated EOB or at sink. OT Short Term Goal 4 (Week 2): Toilet transfer:  Max assist slide board transfer to padded tub bench with cut out as a BSC OT Short Term Goal 5 (Week 2): BUEs:  Patient will tolerate/participate in BUE P/AA/AROM exercises at least 3/7 days  Skilled Therapeutic Interventions/Progress Updates:  Patient received supine in bed with pain in BLEs & BUEs(no rate given, RN aware). Focused skilled intervention on bed mobility, UB/LB bathing & dressing in supine position with HOB elevated prn, patient self-directing care appropriately & prn, memory, functional use of BUEs/hands, supine>sidelying>sit  EOB, EOB>w/c slide board transfer, positioning in w/c, grooming tasks seated at sink in w/c, and overall activity tolerance/endurance. At end of session, left patient seated in w/c with quick release belt donned and all needs within reach.   Precautions:  Precautions Precautions: Fall Precaution Comments: bilateral ue/le, fear of falling  Restrictions Weight Bearing Restrictions: No  See FIM for current functional status  Therapy/Group: Individual Therapy  Briana Newman 02/16/2014, 10:50 AM

## 2014-02-17 ENCOUNTER — Inpatient Hospital Stay (HOSPITAL_COMMUNITY): Payer: Self-pay | Admitting: Occupational Therapy

## 2014-02-17 DIAGNOSIS — F319 Bipolar disorder, unspecified: Secondary | ICD-10-CM

## 2014-02-17 DIAGNOSIS — R29898 Other symptoms and signs involving the musculoskeletal system: Secondary | ICD-10-CM

## 2014-02-17 NOTE — Progress Notes (Signed)
Patient ID: Crystal Stein, female   DOB: 1970-08-04, 43 y.o.   MRN: [id number]  02/17/14. Subjective/Complaints:  43 y.o. female with history of depression with anxiety, recent NSTEMI, who was readmitted on 01/16/14 with recurrent chest pain as well as confusion. CTA chest negative for PE and new hepatomegaly and hepatic steatosis noted. UDS positive for marijuana and benzos. Cardiology consulted and felt that symptoms due to psychosocial issues. Patient reported difficulty walking and well as BLE weakness on 07/29 and MRI lumbar spine without disc herniation or stenosis.    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 She was treated with a round of IVIG without much improv an extra dose of alprazolam   Slept better last night after an additional dose of alprazolam. Still complains of hands being numb. ROS limited  Past Medical History  Diagnosis Date  . Anxiety   . Depression   . Asthma   . 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 COPD (chronic obstructive pulmonary disease)   . GERD (gastroesophageal reflux disease)   . Bipolar disorder   . Blood transfusion 2006  . History of cardiac catheterization July 2015    Angiographically normal coronary arteries with distal tortuosity  . NSTEMI (non-ST elevated myocardial infarction)     Not secondary to obstructive CAD or obvious plaque rupture, possibly vasospasm     Intake/Output Summary (Last 24 hours) at 02/17/14 0821 Last data filed at 02/17/14 0800  Gross per 24 hour  Intake    360 ml  Output    750 ml  Net   -390 ml    Patient Vitals for the past 24 hrs:  BP Temp Temp src Pulse Resp SpO2  02/17/14 0526 124/88 mmHg 98.3 F (36.8 C) Oral 119 18 100 %  02/16/14 2205 118/88 mmHg 98.9 F (37.2 C) Oral 124 17 100 %  02/16/14 1427 121/89 mmHg 98.3 F (36.8 C) Oral 125 18 99 %      Objective: Vital Signs: Blood pressure 124/88, pulse 119, temperature 98.3 F (36.8 C), temperature source Oral, resp. rate 18, height  (1.626 m), weight 49.6 kg (109 lb 5.6 oz), last menstrual period 04/23/2011, SpO2 100.00%. No results found. No results found for this or any previous visit (from the past 72 hour(s)).  Gen: appears much more alert today HEENT: normal  upper dentures in place Cardio: tachy and no murmur Resp: CTA B/L and unlabored GI: BS positive and NT,ND Extremity:  Pulses positive and No Edema; ankle braces in place Skin:   Intact Neuro: 3/5 bilateral deltoids  3 minus/5 bilateral bicep  2 minus/5 bilateral tricep  3 minus/5 left finger flexors  1+ grip and wrist extension left , 1 on Right   2 minus/5 right hip flexors   1 left hip flexors   2- Right and 1/5 Left knee extensors  2- to 2+/5 bilateral hip extensors  Trace movement at ankles persists Decreased sense to PP/LT stocking glove bilateral UE and LE's Psych: flat, anxious at times    Assessment/Plan: 1. 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 2.  DVT Prophylaxis/Anticoagulation: Pharmaceutical: Lovenox 3. Neuropathy/Pain Management: .    -elavil stopped and neurontin decreased due to neurosedation---more alert but pain increased  -will resume LOW dose  elavil tonight to see if we can help her sleep/neuro pain a bit more  - Kpad for right shoulder  -reiterated to patient that she will need to work through some of this pain given her tolerance of meds.     pain tolerance will be an ongoing issue   4. Bipolar disorder/Mood:  Ego support, neuropsych assessment. klonopin 5. Neuropsych: This patient is capable of making decisions on her own behalf. 6. Skin/Wound Care: Pressure relief measures to prevent breakdown. Encourage boosting when OOB. Has very low protein  stores--added nutritional supplement. 7. GBS: plasmapheresis completed per nephrology      8. Hyponatremia:  Improved-- 134 9. Hypokalemia: supplementing   10. Neurogenic bladder: continue foley due incontinence, generalize weakness.  - Has MRSA---     -contact precautions 11. Mild anemia: recheck CBC     12. Abnormal LFTs: Hepatitis panel negative.    -follow up  13. CV---  -imdur and metoprolol, holding prn  -HR still elevated due to deconditioning and anxiety  -TEDS/abd binder potentially  -encouraging fluids  - anemic still but hgb stable   LOS (Days) 16 A FACE TO FACE EVALUATION WAS PERFORMED  Rogelia Boga 02/17/2014, 8:17 AM

## 2014-02-17 NOTE — Progress Notes (Signed)
Occupational Therapy Session Note  Patient Details  Name: Crystal Stein MRN: [id number] Date of Birth: 1970/09/27  Today's Date: 02/17/2014 OT Individual Time: 1300-1345 OT Individual Time Calculation (min): 45 min   Short Term Goals: Week 1:  OT Short Term Goal 1 (Week 1): Pt will feed self 50% of  meal with setup OT Short Term Goal 1 - Progress (Week 1): Progressing toward goal OT Short Term Goal 2 (Week 1): pt will don shirt with min a  OT Short Term Goal 2 - Progress (Week 1): Met (HOB up and long sit in bed) OT Short Term Goal 3 (Week 1): pt will tolerate sitting eob for one adl task with steady assist for 5 min OT Short Term Goal 3 - Progress (Week 1): Progressing toward goal (met before began having OH issues 8/20 & now using THT & abdominal binder) OT Short Term Goal 4 (Week 1): pt will roll in bed with min a with bed rail for LB clothing management OT Short Term Goal 4 - Progress (Week 1): Met  Week 2:  OT Short Term Goal 1 (Week 2): Pt will feed self 50% of 2 meals with setup and use of right dorsal wrist support with u-cuff and left hand for finger food PRN OT Short Term Goal 1 - Progress (Week 2): Progressing toward goal OT Short Term Goal 2 (Week 2): UB dressing:  Patient will donn pullover shirt sitting EOB with close supervision OT Short Term Goal 2 - Progress (Week 2): Progressing toward goal OT Short Term Goal 3 (Week 2): Grooming:  Patient will perform 3 grooming tasks with min assist while seated EOB or at sink. OT Short Term Goal 3 - Progress (Week 2): Met OT Short Term Goal 4 (Week 2): Toilet transfer:  Max assist slide board transfer to padded tub bench with cut out as a BSC OT Short Term Goal 4 - Progress (Week 2): Met OT Short Term Goal 5 (Week 2): BUEs:  Patient will tolerate/participate in BUE P/AA/AROM exercises at least 3/7 days OT Short Term Goal 5 - Progress (Week 2): Progressing toward goal  Week 3:  OT Short Term Goal 1 (Week 3): STGs=LTGs due to ELOS  (pending SNF placement)  Skilled Therapeutic Interventions/Progress Updates:  Patient received supine in bed with clothes donned and 5/10 complaints of pain in BLEs and BUEs; RN aware. Therapist assisted with donning of wrist support splint > RUE, bilateral shoes, and abdominal binder. Patient then engaged in bed mobility and sat EOB, BP=140/76 with TEDs also donned. Patient then transferred EOB>w/c using slide board with max assist. Patient sat in w/c at sink for grooming tasks of brushing hair and brushing teeth. Patient with decreased memory throughout session, repeating the same comments and questions multiple times. Patient with multiple questions about GBS, therapist reminded patient that she had a handout explaining GBS and encouraged patient to read the handout prn for educational purposes. Therapist then engaged patient in BUE AROM exercises of shoulder flexion/extension and elbow flexion/extension. Patient required up to max verbal cues for memory during session. At end of session, left patient seated in w/c with quick release belt donned and all needs within reach.   Precautions:  Precautions Precautions: Fall Precaution Comments: bilateral ue/le, fear of falling  Restrictions Weight Bearing Restrictions: No  ADL: ADL ADL Comments: see fim  See FIM for current functional status  Therapy/Group: Individual Therapy  Amiri Riechers 02/17/2014, 3:03 PM

## 2014-02-18 ENCOUNTER — Inpatient Hospital Stay (HOSPITAL_COMMUNITY): Payer: Medicaid Other

## 2014-02-18 NOTE — Progress Notes (Signed)
Patient ID: Crystal Stein, female   DOB: Apr 19, 1971, 43 y.o.   MRN: [id number]  Patient ID: Crystal Stein, female   DOB: 1970/07/19, 43 y.o.   MRN: [id number]  02/18/14.  Subjective/Complaints:  43 y.o. female with history of depression with anxiety, recent NSTEMI, who was readmitted on 01/16/14 with recurrent chest pain as well as confusion. CTA chest negative for PE and new hepatomegaly and hepatic steatosis noted. UDS positive for marijuana and benzos. Cardiology consulted and felt that symptoms due to psychosocial issues. Patient reported difficulty walking and well as BLE weakness on 07/29 and MRI lumbar spine without disc herniation or stenosis.    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 She was treated with a round of IVIG without much improv an extra dose of alprazolam   Restless night. Still complains of hands being numb. N onew complaints ROS limited  Past Medical History  Diagnosis Date  . Anxiety   . Depression   . Asthma   . 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 COPD (chronic obstructive pulmonary disease)   . GERD (gastroesophageal reflux disease)   . Bipolar disorder   . Blood transfusion 2006  . History of cardiac catheterization July 2015    Angiographically normal coronary arteries with distal tortuosity  . NSTEMI (non-ST elevated myocardial infarction)     Not secondary to obstructive CAD or obvious plaque rupture, possibly vasospasm     Intake/Output Summary (Last 24 hours) at 02/18/14 0817 Last data filed at 02/17/14 2206  Gross per 24 hour  Intake    480 ml  Output   1250 ml  Net   -770 ml    Patient Vitals for the past 24 hrs:  BP Temp Temp src Pulse Resp SpO2  02/18/14 0638 119/85 mmHg 97.8 F (36.6 C) Oral 120 18 99 %  02/17/14 2204 116/83 mmHg 98.3 F (36.8 C) Oral 125 17 98 %  02/17/14 1446 116/87  mmHg 97.8 F (36.6 C) Oral 130 19 99 %       Objective: Vital Signs: Blood pressure 119/85, pulse 120, temperature 97.8 F (36.6 C), temperature source Oral, resp. rate 18, height  (1.626 m), weight 49.6 kg (109 lb 5.6 oz), last menstrual period 04/23/2011, SpO2 99.00%. No results found. No results found for this or any previous visit (from the past 72 hour(s)).  Gen: alert, no distress HEENT: normal  upper dentures in place Cardio: tachy and no murmur Resp: CTA B/L and unlabored GI: BS positive and NT,ND Extremity:  Pulses positive and No Edema; ankle braces in place Skin:   Intact Neuro: 3/5 bilateral deltoids  3 minus/5 bilateral bicep  2 minus/5 bilateral tricep  3 minus/5 left finger flexors  1+ grip and wrist extension left , 1 on Right   2 minus/5 right hip flexors   1 left hip flexors   2- Right and 1/5 Left knee extensors  2- to 2+/5 bilateral hip extensors  Trace movement at ankles persists Decreased sense to PP/LT stocking glove bilateral UE and LE's Psych: flat, anxious at times    Assessment/Plan: 1. 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  DVT Prophylaxis/Anticoagulation: Pharmaceutical: Lovenox 3. Neuropathy/Pain Management: .    -elavil stopped and neurontin decreased due to neurosedation---more alert but pain increased  -will resume LOW dose elavil tonight to  see if we can help her sleep/neuro pain a bit more  - Kpad for right shoulder  -reiterated to patient that she will need to work through some of this pain given her tolerance of meds.     pain tolerance will be an ongoing issue   4. Bipolar disorder/Mood:  Ego support, neuropsych assessment. klonopin 5. GBS: plasmapheresis completed per nephrology       6.  Neurogenic bladder: continue foley due incontinence, generalize weakness.  - Has MRSA---     -contact precautions  7. CV---  -imdur and metoprolol, holding prn  -HR still elevated due to deconditioning and  anxiety  -TEDS/abd binder potentially  -encouraging fluids  - anemic still but hgb stable   LOS (Days) 17 A FACE TO FACE EVALUATION WAS PERFORMED  Rogelia Boga 02/18/2014, 8:17 AM

## 2014-02-18 NOTE — Progress Notes (Signed)
Physical Therapy Session Note  Patient Details  Name: Crystal Stein MRN: [id number] Date of Birth: 10/09/1970  Today's Date: 02/18/2014 PT Individual Time: 4696-2952 PT Individual Time Calculation (min): 60 min   Short Term Goals: Week 3:  PT Short Term Goal 1 (Week 3): = LTGs (SNF Placement)  Skilled Therapeutic Interventions/Progress Updates:    Pt received supine in bed, agreeable to participate in therapy. Pt noted to be in much better spirits w/ improved mood vs previous time this therapist saw her (Wednesday 8/26). Pt moved supine>sit w/ bed rails, HOB functions, and ModA from therapist for moving sidelying>sit. Pt reported small amount of dizziness w/ transfer, quickly abated. Pt able to direct care for set up of sliding board transfer and recall head-hip relationship w/ mod VC's, MaxA for transfer. TotalA to propel w/c to rehab gym. Stretching for HS/heel cords, suspect pt has combination tight musculature and neural tension contributing to pain w/ mobility. MaxA for SBT w/c>mat. 3x15" forward leans for anterior weight shift onto BLE. Sit<>supine w/ ModA for managing BLE (pt nearly able to manage RLE in/out of bed, may have been able to complete without shoes). Therapist taught pt rolling technique w/ UE PNF D2 extension pattern ("chop" pattern). Pt able to roll R w/ CGA and L w/ MinA from flat bed w/ no rails. Pt very satisfied w/ rolling  Technique. Pt w/ MaxA SBT mat>w/c and totalA for propelling w/c back to room. Pt left seated in chair w/ all needs within reach. Pt verbalized understanding of calling nurse to provide tilt back for pressure relief every 20 minutes. RN made aware.  Therapy Documentation Precautions:  Precautions Precautions: Fall Precaution Comments: bilateral ue/le, fear of falling  Restrictions Weight Bearing Restrictions: No Pain: Pain Assessment Pain Assessment: 0-10 Pain Score: 4  Pain Type: Chronic pain Pain Location: Generalized Pain Descriptors /  Indicators: Aching Pain Onset: On-going Patients Stated Pain Goal: 3 Pain Intervention(s): Medication (See eMAR);Repositioned  See FIM for current functional status  Therapy/Group: Individual Therapy  Hosie Spangle Hosie Spangle, PT, DPT 02/18/2014, 12:11 PM

## 2014-02-19 ENCOUNTER — Inpatient Hospital Stay (HOSPITAL_COMMUNITY): Payer: Self-pay | Admitting: Speech Pathology

## 2014-02-19 ENCOUNTER — Inpatient Hospital Stay (HOSPITAL_COMMUNITY): Payer: Self-pay

## 2014-02-19 ENCOUNTER — Encounter (HOSPITAL_COMMUNITY): Payer: Self-pay | Admitting: Occupational Therapy

## 2014-02-19 ENCOUNTER — Encounter (HOSPITAL_COMMUNITY): Payer: Self-pay

## 2014-02-19 ENCOUNTER — Encounter (HOSPITAL_COMMUNITY): Payer: Self-pay | Admitting: *Deleted

## 2014-02-19 DIAGNOSIS — G8250 Quadriplegia, unspecified: Secondary | ICD-10-CM

## 2014-02-19 MED ORDER — KETOTIFEN FUMARATE 0.025 % OP SOLN
1.0000 [drp] | Freq: Two times a day (BID) | OPHTHALMIC | Status: DC
  Administered 2014-02-19 – 2014-02-21 (×4): 1 [drp] via OPHTHALMIC
  Filled 2014-02-19: qty 5

## 2014-02-19 MED ORDER — AMITRIPTYLINE HCL 25 MG PO TABS
25.0000 mg | ORAL_TABLET | Freq: Every day | ORAL | Status: DC
  Administered 2014-02-19 – 2014-02-20 (×2): 25 mg via ORAL
  Filled 2014-02-19 (×3): qty 1

## 2014-02-19 NOTE — Progress Notes (Signed)
Speech Language Pathology Daily Session Note  Patient Details  Name: Crystal Stein MRN: [id number] Date of Birth: [birth year]  Today's Date: 02/19/2014 SLP Individual Time: 4098-1191 SLP Individual Time Calculation (min): 50 min  Short Term Goals: Week 1: SLP Short Term Goal 1 (Week 1): Patient will demonstrate diaphragmatic breathing with Min verbal cues SLP Short Term Goal 2 (Week 1): Patient will utilize pacing/chunking with speech production at the phrase level with Mod verbal/written cues SLP Short Term Goal 3 (Week 1): Patient will utilize exteral aids to assist with recall of daily information with Min question cues.  Skilled Therapeutic Interventions: Skilled treatment session focused on addressing speech and self-care goals.  Upon arrival, patient was upright in chair. SLP facilitated session with discussion regarding previously taught information regarding dysphonia and decreased speech intelligibility, which patient required Total assist to recall.  SLP implemented external aid to facilitate recall of diaphragmatic breathing and "chunking" of speech.   Patient requested to go back to bed and was able to direct RN and SLP during transfer.   Patient was then able to return demonstration of diaphragmatic breathing exercises while supine in bed with Max assist multimodal cues.  Patient verbalized strategies throughout session with use of external aid and required Min cues to chunk short phrases to facilitate improved speech intelligibility.  Patient reported feeling so fatigued that she needed to rest and requested that the session end early; as a result, patient missed 30 minutes of skilled SLP services.   FIM:  Comprehension Comprehension Mode: Auditory Comprehension: 5-Follows basic conversation/direction: With extra time/assistive device Expression Expression Mode: Verbal Expression: 4-Expresses basic 75 - 89% of the time/requires cueing 10 - 24% of the time. Needs helper to  occlude trach/needs to repeat words. Social Interaction Social Interaction: 4-Interacts appropriately 75 - 89% of the time - Needs redirection for appropriate language or to initiate interaction. Problem Solving Problem Solving: 4-Solves basic 75 - 89% of the time/requires cueing 10 - 24% of the time Memory Memory: 5-Requires cues to use assistive device  Pain  Leg pain RN aware and provided medication at end of session  Therapy/Group: Individual Therapy  Charlane Ferretti., CCC-SLP [phone]  Crystal Stein 02/19/2014, 5:19 PM

## 2014-02-19 NOTE — Progress Notes (Signed)
Occupational Therapy Session Note  Patient Details  Name: Crystal Stein MRN: [id number] Date of Birth: Jan 05, 1971  Today's Date: 02/19/2014 OT Individual Time: 2637-8588 OT Individual Time Calculation (min): 60 min    Short Term Goals: Week 1:  OT Short Term Goal 1 (Week 1): Pt will feed self 50% of  meal with setup OT Short Term Goal 1 - Progress (Week 1): Progressing toward goal OT Short Term Goal 2 (Week 1): pt will don shirt with min a  OT Short Term Goal 2 - Progress (Week 1): Met (HOB up and long sit in bed) OT Short Term Goal 3 (Week 1): pt will tolerate sitting eob for one adl task with steady assist for 5 min OT Short Term Goal 3 - Progress (Week 1): Progressing toward goal (met before began having OH issues 8/20 & now using THT & abdominal binder) OT Short Term Goal 4 (Week 1): pt will roll in bed with min a with bed rail for LB clothing management OT Short Term Goal 4 - Progress (Week 1): Met  Week 2:  OT Short Term Goal 1 (Week 2): Pt will feed self 50% of 2 meals with setup and use of right dorsal wrist support with u-cuff and left hand for finger food PRN OT Short Term Goal 1 - Progress (Week 2): Met OT Short Term Goal 2 (Week 2): UB dressing:  Patient will donn pullover shirt sitting EOB with close supervision OT Short Term Goal 2 - Progress (Week 2): Met OT Short Term Goal 3 (Week 2): Grooming:  Patient will perform 3 grooming tasks with min assist while seated EOB or at sink. OT Short Term Goal 3 - Progress (Week 2): Met OT Short Term Goal 4 (Week 2): Toilet transfer:  Max assist slide board transfer to padded tub bench with cut out as a BSC OT Short Term Goal 4 - Progress (Week 2): Met OT Short Term Goal 5 (Week 2): BUEs:  Patient will tolerate/participate in BUE P/AA/AROM exercises at least 3/7 days OT Short Term Goal 5 - Progress (Week 2): Met  Week 3:  OT Short Term Goal 1 (Week 3): STGs=LTGs due to ELOS (pending SNF placement)  Skilled Therapeutic  Interventions/Progress Updates:  Patient received supine in bed with 8/10 complaints of pain "everywhere", RN aware and therapist worked with re-positioning for pain elimination. Therapist assisted with donning of bilateral shoes and pulling pants > waist while patient in supine position. Patient already with pants threaded, TEDs donned, and shirt donned. Patient engaged in bed mobility of supine > side lying with minimal assistance and side lying > sit EOB with max assist. Therapist donned abdominal binder secondary to patient with complaints of dizziness. BP=94/71 and HR =121. Therapist re-checked vitals, BP=154/121 and HR=79. Therapist notified RN right away about drastic difference in viatal readings. RN told therapist to proceed on with therapy session; patient with no physical symptoms of low or high BP once seated EOB for a few minutes. Therapist assisted patient with doffing dirty shirt and donning clean shirt; patient required close supervision for donning of shirt while seated EOB. Therapist donned abdominal binder and BP=94/73 and HR=129. Patient worked on [employer] w/c [employer] transfer and required max assist. Patient then sat at sink for grooming tasks of washing face and brushing hair; patient set-up assist for these tasks. At end of session, left patient seated in w/c with quick release belt donned and all needs within reach.   Precautions:  Precautions Precautions: Fall  Precaution Comments: bilateral ue/le, fear of falling  Restrictions Weight Bearing Restrictions: No  See FIM for current functional status  Therapy/Group: Individual Therapy  Crystal Stein 02/19/2014, 9:58 AM

## 2014-02-19 NOTE — Progress Notes (Signed)
Physical Therapy Session Note  Patient Details  Name: DEMIKA LANGENDERFER MRN: [id number] Date of Birth: 11-14-1970  Today's Date: 02/19/2014 PT Individual Time: 1000-1100 PT Individual Time Calculation (min): 60 min   Short Term Goals: Week 2:  PT Short Term Goal 1 (Week 2): Pt will roll R/L with min A  PT Short Term Goal 1 - Progress (Week 2): Met PT Short Term Goal 2 (Week 2): Pt will maintain unsupported sitting x85mn with S PT Short Term Goal 2 - Progress (Week 2): Met PT Short Term Goal 3 (Week 2): Pt will perform sliding board transfer with max A consistently PT Short Term Goal 3 - Progress (Week 2): Met PT Short Term Goal 4 (Week 2): pt will demonstrate R or L lateral lean x 30 seconds with assistance in order to perform pressure relief in sitting PT Short Term Goal 4 - Progress (Week 2): Met Week 3:  PT Short Term Goal 1 (Week 3): = LTGs (SNF Placement)  Skilled Therapeutic Interventions/Progress Updates:    Session focused on functional SB transfers with emphasis on pt directing care (mod verbal cues needed) with mod A from w/c to mat and steady A from mat to w/c and to manage BLE's on the block and neuro re-ed for dynamic sitting balance and functional use of UE's and LE's to toss and kick beach ball back and forth with steady A needed for balance and verbal and tactile cueing for posture and balance. Pt tearful during session today stating she is concerned about her boyfriend not wanting to stay with her because she is "damaged goods" and emotional support provided. Pt also reports significant fatigue requiring rest breaks and encouragement throughout session. Positioned in w/c at end of session with all needs in reach and reminded to call for boost in 20 min.  Therapy Documentation Precautions:  Precautions Precautions: Fall Precaution Comments: bilateral ue/le, fear of falling  Restrictions Weight Bearing Restrictions: No  Pain:  Reports pain is the same - premedicated. C/o  calf pain during transfer - relieved with repositioning.  See FIM for current functional status  Therapy/Group: Individual Therapy and Co-Treatment with Recreational therapy (1030-11)  GCanary BrimBOceans [facility]8/31/2015, 11:47 AM

## 2014-02-19 NOTE — Progress Notes (Signed)
Subjective/Complaints: 43 y.o. female with history of depression with anxiety, recent NSTEMI, who was readmitted on 01/16/14 with recurrent chest pain as well as confusion. CTA chest negative for PE and new hepatomegaly and hepatic steatosis noted. UDS positive for marijuana and benzos. Cardiology consulted and felt that symptoms due to psychosocial issues. Patient reported difficulty walking and well as BLE weakness on 07/29 and MRI lumbar spine without disc herniation or stenosis. 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 She was treated with a round of IVIG without much improvement.   Up this morning. Sleeps intermittently. Overall pain/anxiety continue to be factors.    Objective: Vital Signs: Blood pressure 115/86, pulse 121, temperature 98.6 F (37 C), temperature source Oral, resp. rate 17, height  (1.626 m), weight 50.4 kg (111 lb 1.8 oz), last menstrual period 04/23/2011, SpO2 99.00%. No results found. No results found for this or any previous visit (from the past 72 hour(s)).  Gen: appears much more alert today HEENT: normal Cardio: tachy and no murmur Resp: CTA B/L and unlabored GI: BS positive and NT,ND Extremity:  Pulses positive and No Edema Skin:   Intact Neuro: 3/5 bilateral deltoids  3 minus/5 bilateral bicep  2 minus/5 bilateral tricep  3 minus/5 left finger flexors  1+ grip and wrist extension left , 1 on Right   2 minus/5 right hip flexors   1 left hip flexors   2- Right and 1/5 Left knee extensors  2- to 2+/5 bilateral hip extensors  Trace movement at ankles persists Decreased sense to PP/LT stocking glove bilateral UE and LE's Psych: flat, anxious at times    Assessment/Plan: 1. Functional deficits secondary to quadriparesis  secondary to GBS which require 3+ hours per day of interdisciplinary therapy in a comprehensive inpatient rehab setting. Physiatrist is providing close team supervision and 24 hour management of active medical problems listed below. Physiatrist and rehab team continue to assess barriers to discharge/monitor patient progress toward functional and medical goals.     FIM: FIM - Bathing Bathing Steps Patient Completed: Abdomen;Right Arm;Chest;Left Arm;Right upper leg;Left upper leg Bathing: 3: Mod-Patient completes 5-7 51f 10 parts or 50-74%  FIM - Upper Body Dressing/Undressing Upper body dressing/undressing steps patient completed: Thread/unthread right bra strap;Thread/unthread left bra strap;Thread/unthread right sleeve of pullover shirt/dresss;Thread/unthread left sleeve of pullover shirt/dress Upper body dressing/undressing: 3: Mod-Patient completed 50-74% of tasks FIM - Lower Body Dressing/Undressing Lower body dressing/undressing: 1: Total-Patient completed less than 25% of tasks  FIM - Toileting Toileting: 1: Two helpers (per Mady Gemma, NT report)  FIM - Diplomatic Services operational officer Devices: Bedside commode (and use of stedy) Toilet Transfers: 0-Activity did not occur  FIM - Banker Devices: Sliding board Bed/Chair Transfer: 3: Supine > Sit: Mod A (lifting assist/Pt. 50-74%/lift 2 legs;3: Sit > Supine: Mod A (lifting assist/Pt. 50-74%/lift 2 legs);2: Bed > Chair or W/C: Max A (lift and lower assist);2: Chair or W/C > Bed: Max A (lift and lower assist)  FIM - Locomotion: Wheelchair Distance: 150 x 2 Locomotion: Wheelchair: 1: Total Assistance/staff pushes wheelchair (Pt<25%) FIM - Locomotion: Ambulation Locomotion: Ambulation: 0: Activity did not occur  Comprehension Comprehension Mode: Auditory Comprehension: 5-Follows basic conversation/direction: With extra time/assistive device  Expression Expression Mode:  Verbal Expression: 4-Expresses basic  75 - 89% of the time/requires cueing 10 - 24% of the time. Needs helper to occlude trach/needs to repeat words.  Social Interaction Social Interaction Mode: Not assessed Social Interaction: 4-Interacts appropriately 75 - 89% of the time - Needs redirection for appropriate language or to initiate interaction.  Problem Solving Problem Solving Mode: Not assessed Problem Solving: 4-Solves basic 75 - 89% of the time/requires cueing 10 - 24% of the time  Memory Memory: 2-Recognizes or recalls 25 - 49% of the time/requires cueing 51 - 75% of the time  Medical Problem List and Plan: 1. 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  DVT Prophylaxis/Anticoagulation: Pharmaceutical: Lovenox 3. Neuropathy/Pain Management: .    - neurontin decreased due to neurosedation---more alert but pain increased  -increase LOW dose elavil to  qhs  - Kpad for right shoulder    4. Bipolar disorder/Mood:  Ego support, neuropsych assessment.   -continue xanax tid 5. Neuropsych: This patient is capable of making decisions on her own behalf. 6. Skin/Wound Care: Pressure relief measures to prevent breakdown. Encourage boosting when OOB. Has very low protein stores--added nutritional supplement. 7. GBS: plasmapheresis completed per nephrology      8. Hyponatremia:  Improved-- 134 9. Hypokalemia: supplementing   10. Neurogenic bladder: continue foley due incontinence, generalize weakness.  - Has MRSA---     -contact precautions 11. Mild anemia: recheck CBC     12. Abnormal LFTs: Hepatitis panel negative.    -follow up  13. CV---  -imdur and metoprolol, holding prn  -HR still elevated due to deconditioning and anxiety  -TEDS/abd binder potentially  -encouraging fluids  - anemic still but hgb stable   LOS (Days) 18 A FACE TO FACE EVALUATION WAS PERFORMED  Crystal Stein T 02/19/2014, 7:55 AM

## 2014-02-19 NOTE — Progress Notes (Signed)
Recreational Therapy Session Note  Patient Details  Name: Crystal Stein MRN: [id number] Date of Birth: 1970-10-07 Today's Date: 02/19/2014  Pain: c/o calf pain during slide board transfers, relieved with repositioning Skilled Therapeutic Interventions/Progress Updates: Session focused on activity tolerance, dynamic sitting balance, UE/LE strength & coordination, slide board transfer.  Pt sat EOM to toss or kick a ball with min assist, min cues for posture.  Pt tearful during session when talking about her boyfriend & their plans to get married.  Emotional support provided.  Pt complaining of fatigue throughout session needing frequent rest breaks.  Therapy/Group: Co-Treatment  Sachin Ferencz 02/19/2014, 12:16 PM

## 2014-02-19 NOTE — Consult Note (Signed)
02/19/14  PSYCHOSOCIAL NOTE - CONFIDENTIAL  [facility]   MEDICAL NECESSITY:  Crystal Stein was seen on the [facility] for neuropsychological follow-up owing to the patient's diagnosis of Guillan-Barre Syndrome (GBS).   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 CTA chest negative for PE and new hepatomegaly and hepatic steatosis noted. Urine drug screen was positive for marijuana and benzodiazepines. Cardiology consulted and felt that her symptoms were due to psychosocial issues. 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 She was treated with a round of IVIG without much improvement.   Crystal Stein was previously seen on 02/05/2014 for an initial diagnostic evaluation. Relevant information is as follows:   Overall, Crystal Stein reports suffering from mild memory loss and significant depression and anxiety owing to her present situation. We discussed treatment for mood and I recommended initiating an antidepressant. Patient was agreeable but also asked if she could be prescribed a benzodiazepine. I said that decision would be left to her physician but I encouraged her to try the antidepressant first. I will also follow Crystal Stein throughout her admission for coping and supportive psychotherapy. Cognitive testing may be warranted at some point during her stay or as an outpatient.   Crystal Stein was seen today for follow-up. During today's visit, she reported suffering from improved thinking skills and no major cognitive difficulties. She continues to suffer from depression and  anxiety in reaction to her present medical situation. She also mentioned feeling that she is not making much progress in therapy despite the reports of her therapists. Fortunately, there have been no barriers keeping her from participating. I am thankful for this as she previously stated that she thought fatigue and low energy might hold her back. Crystal Stein' only major sources of support are her parents and her boyfriend, but even he has been unable to visit because of his busy work schedule. She seemed upset that her children have not come to see her.   Emotional & Behavioral Evaluation: Crystal Stein was appropriately dressed for season and situation. She was generally friendly and rapport was adequately established. Her voice was hypophonic and raspy but she was able to express ideas effectively. This seemed improved since our last visit. Her affect was somewhat blunted and she seemed depressed and even became tearful at times. Attention and motivation were adequate.   From an emotional standpoint, Crystal Stein continues to suffer from at least a mild-to-moderate degree of current depression and anxiety in relation to her present medical situation.  No major adjustment issues endorsed. No major barriers to therapy identified. Suicidal/homicidal ideation, plan or intent was denied. No manic or hypomanic episodes were reported. The patient denied ever experiencing any auditory/visual hallucinations. No major behavioral or personality changes were endorsed.    IMPRESSION: Crystal Stein seems to be more energetic and less fatigued. She denied any major cognitive difficulties. She continues to suffer from mild-to-moderate depression and anxiety but she is not interested in taking an antidepressant. She focused mostly on her want for Xanax and mentioned several times that she is only getting 0.5mg  PRN and that was not doing much. She would  rather get . I spent time discussing other methods of controlling anxiety  but she did not seem interested. Plan at this time will be to provide her with as much psychotherapeutic support as possible to assist with coping.   RECOMMENDATIONS  Recommendations for treatment team:    If medication for mood control is utilized, I encourage implementing an antidepressant, if not medically contraindicated. Fluoxetine might be a good choice owing to its energizing properties. Use benzo with caution.    Brief counseling for social support seems warranted during this hospitalization. Please place her on my schedule for next Tuesday, September 8th. She can see Dr. Wylene Simmer sooner if necessary.   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  Clinical Neuropsychologist

## 2014-02-19 NOTE — Plan of Care (Signed)
Problem: RH BLADDER ELIMINATION Goal: RH STG MANAGE BLADDER WITH ASSISTANCE STG Manage Bladder With Assistance. Min A  Outcome: Not Progressing Foley in place due to neurogenic bladder

## 2014-02-20 ENCOUNTER — Inpatient Hospital Stay (HOSPITAL_COMMUNITY): Payer: Self-pay

## 2014-02-20 ENCOUNTER — Inpatient Hospital Stay (HOSPITAL_COMMUNITY): Payer: Self-pay | Admitting: *Deleted

## 2014-02-20 ENCOUNTER — Ambulatory Visit (HOSPITAL_COMMUNITY): Payer: Self-pay | Admitting: *Deleted

## 2014-02-20 ENCOUNTER — Encounter (HOSPITAL_COMMUNITY): Payer: Self-pay | Admitting: *Deleted

## 2014-02-20 ENCOUNTER — Encounter (HOSPITAL_COMMUNITY): Payer: Self-pay | Admitting: Occupational Therapy

## 2014-02-20 DIAGNOSIS — G8250 Quadriplegia, unspecified: Secondary | ICD-10-CM

## 2014-02-20 MED ORDER — GABAPENTIN 300 MG PO CAPS: 300.0000 mg | ORAL_CAPSULE | Freq: Three times a day (TID) | ORAL | Status: AC

## 2014-02-20 MED ORDER — DIPHENHYDRAMINE HCL 25 MG PO CAPS: 25.0000 mg | ORAL_CAPSULE | Freq: Four times a day (QID) | ORAL | Status: AC | PRN

## 2014-02-20 MED ORDER — SALINE SPRAY 0.65 % NA SOLN: 1.0000 | NASAL | Status: AC | PRN

## 2014-02-20 MED ORDER — POLYETHYLENE GLYCOL 3350 17 G PO PACK: 17.0000 g | PACK | Freq: Every day | ORAL | Status: AC

## 2014-02-20 MED ORDER — KETOTIFEN FUMARATE 0.025 % OP SOLN: 1.0000 [drp] | Freq: Two times a day (BID) | OPHTHALMIC | Status: AC

## 2014-02-20 MED ORDER — TRAMADOL HCL 50 MG PO TABS: 50.0000 mg | ORAL_TABLET | Freq: Four times a day (QID) | ORAL | Status: AC | PRN

## 2014-02-20 MED ORDER — POTASSIUM CHLORIDE CRYS ER 20 MEQ PO TBCR: 20.0000 meq | EXTENDED_RELEASE_TABLET | Freq: Every day | ORAL | Status: AC

## 2014-02-20 MED ORDER — OXYCODONE HCL 5 MG PO TABS: 10.0000 mg | ORAL_TABLET | Freq: Four times a day (QID) | ORAL | Status: AC | PRN

## 2014-02-20 MED ORDER — TRAZODONE HCL 50 MG PO TABS: 50.0000 mg | ORAL_TABLET | Freq: Every day | ORAL | Status: DC

## 2014-02-20 MED ORDER — LIDOCAINE HCL 2 % EX GEL
CUTANEOUS | Status: DC | PRN
  Filled 2014-02-20: qty 5

## 2014-02-20 MED ORDER — AMITRIPTYLINE HCL 25 MG PO TABS: 25.0000 mg | ORAL_TABLET | Freq: Every day | ORAL | Status: AC

## 2014-02-20 MED ORDER — ALPRAZOLAM 0.5 MG PO TABS: 0.5000 mg | ORAL_TABLET | Freq: Three times a day (TID) | ORAL | Status: AC

## 2014-02-20 MED ORDER — FLUOXETINE HCL 10 MG PO CAPS
10.0000 mg | ORAL_CAPSULE | Freq: Every day | ORAL | Status: DC
  Administered 2014-02-20 – 2014-02-21 (×2): 10 mg via ORAL
  Filled 2014-02-20 (×3): qty 1

## 2014-02-20 MED ORDER — METHOCARBAMOL 500 MG PO TABS: 500.0000 mg | ORAL_TABLET | Freq: Four times a day (QID) | ORAL | Status: AC | PRN

## 2014-02-20 MED ORDER — FLEET ENEMA 7-19 GM/118ML RE ENEM: 1.0000 | ENEMA | Freq: Every day | RECTAL | Status: AC | PRN

## 2014-02-20 MED ORDER — FLUOXETINE HCL 10 MG PO CAPS: 10.0000 mg | ORAL_CAPSULE | Freq: Every day | ORAL | Status: AC

## 2014-02-20 NOTE — Progress Notes (Signed)
Occupational Therapy Session Notes  Patient Details  Name: Crystal Stein MRN: [id number] Date of Birth: 1970-07-31  Today's Date: 02/20/2014  Session #1 OT Individual Time: 0093-8182 OT Individual Time Calculation: 60 min   Session #2 Co-Treatment Time: 1300-1400 with PT Co-Treatment OT Calculation:1300-1330 - 30 Minutes  Short Term Goals: Week 1:  OT Short Term Goal 1 (Week 1): Pt will feed self 50% of  meal with setup OT Short Term Goal 1 - Progress (Week 1): Progressing toward goal OT Short Term Goal 2 (Week 1): pt will don shirt with min a  OT Short Term Goal 2 - Progress (Week 1): Met (HOB up and long sit in bed) OT Short Term Goal 3 (Week 1): pt will tolerate sitting eob for one adl task with steady assist for 5 min OT Short Term Goal 3 - Progress (Week 1): Progressing toward goal (met before began having OH issues 8/20 & now using THT & abdominal binder) OT Short Term Goal 4 (Week 1): pt will roll in bed with min a with bed rail for LB clothing management OT Short Term Goal 4 - Progress (Week 1): Met  Week 2:  OT Short Term Goal 1 (Week 2): Pt will feed self 50% of 2 meals with setup and use of right dorsal wrist support with u-cuff and left hand for finger food PRN OT Short Term Goal 1 - Progress (Week 2): Met OT Short Term Goal 2 (Week 2): UB dressing:  Patient will donn pullover shirt sitting EOB with close supervision OT Short Term Goal 2 - Progress (Week 2): Met OT Short Term Goal 3 (Week 2): Grooming:  Patient will perform 3 grooming tasks with min assist while seated EOB or at sink. OT Short Term Goal 3 - Progress (Week 2): Met OT Short Term Goal 4 (Week 2): Toilet transfer:  Max assist slide board transfer to padded tub bench with cut out as a BSC OT Short Term Goal 4 - Progress (Week 2): Met OT Short Term Goal 5 (Week 2): BUEs:  Patient will tolerate/participate in BUE P/AA/AROM exercises at least 3/7 days OT Short Term Goal 5 - Progress (Week 2): Met  Week 3:   OT Short Term Goal 1 (Week 3): STGs=LTGs due to ELOS (pending SNF placement)  Skilled Therapeutic Interventions/Progress Updates:   Session #1 Patient with 5/10 complaints of pain in BLEs, RN aware. Patient received supine in bed with complaints of being tired and not sleeping well last night. Patient willing to work with therapist. Therapist engaged patient in LB undressing/dressing task with [facility] raised prn, therapist changing brief and assisting with peri cleansing. Therapist donned shampoo cap and patient used BUEs to shampoo hair. From here, patient engaged in bed mobility to sit EOB, patient sat EOB with moderate assistance from therapist. Patient then sat EOB with no complaints of dizziness and performed UB undressing/dressing. Patient able to doff shirt with min assist and don shirt with supervision. Therapist donned abdominal binder, then patient transferred EOB>w/c with max assist using slide board. Patient sat in w/c at sink for grooming tasks. At end of session, therapist donned quick release belt for safety and left patient with all needs within reach.  *Patient with poor memory during session and had TEDs donned at beginning of session, unsure when they were donned. Therapist re-iterated importance of doffing TEDs during the night and donning TEDs during the day.   Session #2 Co-Treatment with PT focusing on functional mobility, sit<>stands, static standing balance/tolerance/endurance,  and overall activity tolerance/endurance. Patient found seated in w/c with increased complaints of pain in buttock secondary to sitting up for awhile. Therapist propelled patient > therapy gym and patient transferred w/c>edge of mat using slide board with max assist. Patient sat EOB for dynamic sitting balance activity. Patient then engaged in bed mobility activity. Patient sat edge of mat from here and worked on sit<>stands using standing frame. Patient with TEDs and abdominal binder donned, BP decreased during  standing; please see doc flow sheets for BP readings. When BP decreased, therapist immediately sat patient down and BP would increase. Patient transferred back to w/c using slide board with max assist and therapist assisted patient back to room. Patient requested to get back to bed and with continued complaints of pain from catheter, notified RN of this. At end of session, left patient supine in bed with RN present.   Precautions:  Precautions Precautions: Fall Precaution Comments: bilateral ue/le, fear of falling  Restrictions Weight Bearing Restrictions: No  See FIM for current functional status  Roxine Whittinghill 02/20/2014, 9:32 AM

## 2014-02-20 NOTE — Progress Notes (Signed)
Occupational Therapy Discharge Summary  Patient Details  Name: Crystal Stein MRN: [id number] Date of Birth: [birth year]  Patient has met 4 of 9 long term goals due to improved activity tolerance, improved balance, postural control, ability to compensate for deficits, functional use of  RIGHT upper and LEFT upper extremity, improved attention, improved awareness and improved coordination.  Patient to discharge at overall supervision for UB ADLs, min assist for grooming tasks, total assist for LB ADLs, and max assist for functional slide board transfers level.  Patient's care partner requires assistance to provide the necessary physical assistance at discharge. Patient discharging > SNF.   Reasons goals not met:  1) Patient did not meet self-feeding goal of supervision, patient requires up to min assist for entire meal secondary to fatigue.  2) Patient did not meet bathing goal of min assist, patient requires mod assist for bathing.  3) Patient did not meet LB dressing goal, patient requires max assist for LB dressing.  4) Patient did not meet toileting goal of max assist, patient requires total assist for toileting. 5) Patient did not meet toilet transfer goal of min assist, patient requires max assist using slide board transfer  Recommendation:  Patient will benefit from ongoing skilled OT services in skilled nursing facility setting to continue to advance functional skills in the area of BADL, iADL and Reduce care partner burden.  Equipment: No equipment provided, defer to next venue.   Reasons for discharge: discharge from hospital  Patient/family agrees with progress made and goals achieved: Yes  Precautions/Restrictions  Precautions Precautions: Fall Precaution Comments: bilateral ue/le, fear of falling  Restrictions Weight Bearing Restrictions: No  ADL ADL ADL Comments: see fim  Vision/Perception  Vision- History Baseline Vision/History: Wears glasses Wears Glasses:  Reading only Patient Visual Report: No change from baseline Vision- Assessment Vision Assessment?: No apparent visual deficits Perception Perception: Within Functional Limits   Cognition Arousal/Alertness: Awake/alert Orientation Level: Oriented X4 (patient with memory deficits) Attention: Sustained Sustained Attention: Appears intact Memory: Impaired Memory Impairment: Decreased recall of new information;Decreased short term memory Awareness: Impaired Awareness Impairment: Anticipatory impairment Problem Solving: Appears intact Safety/Judgment: Appears intact  Sensation Sensation Light Touch: Impaired Detail Light Touch Impaired Details: Impaired RLE;Impaired LLE Proprioception: Impaired Detail Proprioception Impaired Details: Absent LLE;Absent RLE Additional Comments: Patient with complaints of numbness and tingling in BUEs Coordination Gross Motor Movements are Fluid and Coordinated: No Fine Motor Movements are Fluid and Coordinated: No  Motor  Motor Motor: Tetraplegia;Ataxia;Abnormal postural alignment and control;Abnormal tone  Trunk/Postural Assessment  Cervical Assessment Cervical Assessment: Within Functional Limits Thoracic Assessment Thoracic Assessment: Exceptions to [facility] (kyphotic) Lumbar Assessment Lumbar Assessment: Exceptions to [facility] (posterior pelvic tilt) Postural Control Postural Control: Deficits on evaluation   Balance Balance Balance Assessed: Yes Static Sitting Balance Static Sitting - Level of Assistance: 5: Stand by assistance Dynamic Sitting Balance Dynamic Sitting - Level of Assistance: 5: Stand by assistance Static Standing Balance Static Standing - Level of Assistance: 1: +1 Total assist (with lift equipment)  Extremity/Trunk Assessment RUE Assessment RUE Assessment: Exceptions to [facility] RUE AROM (degrees) Overall AROM Right Upper Extremity: Deficits RUE Overall AROM Comments: Patient's shoulder and elbow ROM is WFL, patient with  decreased AROM elbow>distally. See below for strength testing.  Right Shoulder Extension: 110 Degrees RUE Strength RUE Overall Strength: Deficits RUE Overall Strength Comments: proximal 3/5  distally 2-/5 LUE Assessment LUE Assessment: Exceptions to WFL LUE AROM (degrees) Overall AROM Left Upper Extremity: Deficits LUE Overall AROM Comments: limited by fatigue, patient  with pre-morbid decreased AROM secondary to shoulder surgery in past Left Shoulder Extension: 110 Degrees Left Wrist Extension: 15 Degrees Left Wrist Flexion:  (remains in flexion) LUE Strength LUE Overall Strength: Deficits LUE Overall Strength Comments: proximally 3/5   distally 2-5  See FIM for current functional status  Crystal Stein 02/20/2014, 3:39 PM

## 2014-02-20 NOTE — Progress Notes (Signed)
Physical Therapy Session Note  Patient Details  Name: Crystal Stein MRN: [id number] Date of Birth: 02/11/1971  Today's Date: 02/20/2014 PT Individual Time: 1030-1102 PT Individual Time Calculation (min): 32 min   Short Term Goals: Week 2:  PT Short Term Goal 1 (Week 2): Pt will roll R/L with min A  PT Short Term Goal 1 - Progress (Week 2): Met PT Short Term Goal 2 (Week 2): Pt will maintain unsupported sitting x50mn with S PT Short Term Goal 2 - Progress (Week 2): Met PT Short Term Goal 3 (Week 2): Pt will perform sliding board transfer with max A consistently PT Short Term Goal 3 - Progress (Week 2): Met PT Short Term Goal 4 (Week 2): pt will demonstrate R or L lateral lean x 30 seconds with assistance in order to perform pressure relief in sitting PT Short Term Goal 4 - Progress (Week 2): Met Week 3:  PT Short Term Goal 1 (Week 3): = LTGs (SNF Placement)  Skilled Therapeutic Interventions/Progress Updates:    Session focused on w/c propulsion in manual w/c (previous therapist assisted pt into manual w/c) and focused on w/c propulsion using BUE and BLE. Therapist put theraband on both sides of rims for increased ability to grip wheels and cues for technique and pt then able to propel x 10' with min A and extra time. While taking rest breaks reviewed and practiced anterior lean for pressure relief x 5 reps with 15-30 second hold and scapular retraction exercises 2 sets of 10 reps for postural musculature for strengthening. Reviewed pt still needs to call staff every 20 min for pressure relief (boosting) and pt verbalized understanding.   Therapy Documentation Precautions:  Precautions Precautions: Fall Precaution Comments: bilateral ue/le, fear of falling  Restrictions Weight Bearing Restrictions: No  Pain:  Reports pain is "the same" today and already premedicated.  See FIM for current functional status  Therapy/Group: Individual Therapy  GCanary BrimBGallup Indian Medical Center9/06/2013, 11:57  AM

## 2014-02-20 NOTE — Progress Notes (Signed)
Speech Language Pathology Discharge Summary  Patient Details  Name: Crystal Stein MRN: [id number] Date of Birth: 1970-11-06  Patient has met 2 of 2 long term goals.  Patient to discharge at overall Supervision;Min level.  Reasons goals not met: n/a   Clinical Impression/Discharge Summary: Patient has made functional gains during her inpatient rehab stay and has met 2 of 2 long term goals this admission due to improved use of external memory aids and speech intelligibility.  Patient is currently an overall Supervision-Min assist for creating and initial implementation of external memory aids and requires cues occasional cues for utilization.  Patient also requires Supervision-Min assist cues for effective use of speech intelligibility strategies due to her dysphonia.  Patient and family education is ongoing and as a result patient would benefit from skilled follow up SLP services at the next level of care to maximize her functional independence.   Care Partner:  Caregiver Able to Provide Assistance: No  Type of Caregiver Assistance: Physical;Cognitive  Recommendation:  24 hour supervision/assistance;Skilled Nursing facility  Rationale for SLP Follow Up: Maximize functional communication   Equipment: none   Reasons for discharge: Treatment goals met;Discharged from hospital   Patient/Family Agrees with Progress Made and Goals Achieved: Yes   See FIM for current functional status  Carmelia Roller., CCC-SLP [phone]  [place] 02/20/2014, 8:01 PM

## 2014-02-20 NOTE — Progress Notes (Signed)
Physical Therapy Session Note  Patient Details  Name: Crystal Stein MRN: [id number] Date of Birth: 11-24-1970  Today's Date: 02/20/2014 PT Individual Time: 1000-1030 PT Individual Time Calculation (min): 30 min   Short Term Goals: Week 3:  PT Short Term Goal 1 (Week 3): = LTGs (SNF Placement)  Skilled Therapeutic Interventions/Progress Updates:  1:1. Pt received sitting in tilt-in-space w/c, ready for therapy. Focus this session on functional transfers and positioning/tolerance in basic w/c. RN present at start of session to provide medications. Pt req mod Ax1 person for SBT tilt-in-space>bed>basic w/c. Pt positioned in 16x16 basic w/c with basic cushion to assess sitting tolerance x90min w/ no fatigue in postural control noted. Pt left sitting in basic w/c in care of pt's primary PT.    Therapy Documentation Precautions:  Precautions Precautions: Fall Precaution Comments: bilateral ue/le, fear of falling  Restrictions Weight Bearing Restrictions: No  See FIM for current functional status  Therapy/Group: Individual Therapy  Denzil Hughes 02/20/2014, 12:24 PM

## 2014-02-20 NOTE — Progress Notes (Signed)
Physical Therapy Session Note  Patient Details  Name: Crystal Stein MRN: [id number] Date of Birth: [birth year]  Today's Date: 02/20/2014 Total Co-treat time: 13:00-14:00 ( )  PT Co-Treatment Time: 1330-1400 PT Co-Treatment Time Calculation (min): 30 min  Short Term Goals: Week 3:  PT Short Term Goal 1 (Week 3): = LTGs (SNF Placement)  Skilled Therapeutic Interventions/Progress Updates:    Tx focused on increasing activity tolerance, standing frame, functional mobility, and strengthening. Pt up in standard WC, c/o sacral discomfort as well as catheter position - nursing made aware and assessing at end of tx.  Staff pushed pt to gym and assisted with max A transfers, likely due to increased pain and fatigue today.   Performed sit<>supine with pt encouraged to direct self-cares, needing Max A for LE lifting and trunk assist. Pt unable to recall sequence or technique of transfer, needing step-by step cues.   Pt able to perform rolling with cues for set-up, head turn and can lift hips via bridge in available range.   Performed strengthening activities in gravity-eliminated positions with manual assist prn, tactile cues provided and pt able to complete more than she thought she could.   Performed standing at standing frame 3x50min each, monitoring BP and orthostatic symptoms. See vitals sheet for low BP readings, which improved during each trial.   Assisted pt WC>bed with max A for advancing hips and placing sliding board. Nursing arrived for assessment.    Therapy Documentation Precautions:  Precautions Precautions: Fall Precaution Comments: bilateral ue/le, fear of falling  Restrictions Weight Bearing Restrictions: No    Vital Signs: Therapy Vitals Temp: 98.3 F (36.8 C) Temp src: Oral Pulse Rate: 107 Resp: 17 BP: 102/49 mmHg Patient Position (if appropriate): Lying Oxygen Therapy SpO2: 100 % O2 Device: None (Room air) Pain: no complaints    Trunk/Postural  Assessment : Cervical Assessment Cervical Assessment: Within Functional Limits   See FIM for current functional status  Therapy/Group: Co-Treatment Clydene Laming, PT, DPT   02/20/2014, 3:26 PM

## 2014-02-20 NOTE — Progress Notes (Signed)
Subjective/Complaints: 43 y.o. female with history of depression with anxiety, recent NSTEMI, who was readmitted on 01/16/14 with recurrent chest pain as well as confusion. CTA chest negative for PE and new hepatomegaly and hepatic steatosis noted. UDS positive for marijuana and benzos. Cardiology consulted and felt that symptoms due to psychosocial issues. Patient reported difficulty walking and well as BLE weakness on 07/29 and MRI lumbar spine without disc herniation or stenosis. 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 She was treated with a round of IVIG without much improvement.   Slept better last night. Pain/mood issues prevail    Objective: Vital Signs: Blood pressure 118/84, pulse 118, temperature 98.1 F (36.7 C), temperature source Oral, resp. rate 18, height  (1.626 m), weight 51.1 kg (112 lb 10.5 oz), last menstrual period 04/23/2011, SpO2 98.00%. No results found. No results found for this or any previous visit (from the past 72 hour(s)).  Gen: no distress HEENT: normal Cardio: tachy and no murmur Resp: CTA B/L and unlabored GI: BS positive and NT,ND Extremity:  Pulses positive and No Edema Skin:   Intact Neuro: 3/5 bilateral deltoids  3 minus/5 bilateral bicep  2 minus/5 bilateral tricep  3 minus/5 left finger flexors  1+ grip and wrist extension left , 1 on Right   2 minus/5 right hip flexors   1 left hip flexors   2- Right and 1/5 Left knee extensors  2- to 2+/5 bilateral hip extensors  Trace movement at ankles persists Decreased sense to PP/LT stocking glove bilateral UE and LE's Psych: flat, anxious at times    Assessment/Plan: 1. 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 Physiatrist is providing close team supervision and 24 hour management of active medical problems listed below. Physiatrist and rehab team continue to assess barriers to discharge/monitor patient progress toward functional and medical goals.     FIM: FIM - Bathing Bathing Steps Patient Completed: Abdomen;Right Arm;Chest;Left Arm;Right upper leg;Left upper leg Bathing: 3: Mod-Patient completes 5-7 63f 10 parts or 50-74%  FIM - Upper Body Dressing/Undressing Upper body dressing/undressing steps patient completed: Thread/unthread right bra strap;Thread/unthread left bra strap;Thread/unthread right sleeve of pullover shirt/dresss;Thread/unthread left sleeve of pullover shirt/dress Upper body dressing/undressing: 3: Mod-Patient completed 50-74% of tasks FIM - Lower Body Dressing/Undressing Lower body dressing/undressing: 1: Total-Patient completed less than 25% of tasks  FIM - Toileting Toileting: 1: Two helpers  FIM - Diplomatic Services operational officer Devices: Bedside commode (and use of stedy) Toilet Transfers: 0-Activity did not occur  FIM - Banker Devices: Sliding board;Arm rests Bed/Chair Transfer: 3: Chair or W/C > Bed: Mod A (lift or lower assist);4: Bed > Chair or W/C: Min A (steadying Pt. > 75%) (on mat surface)  FIM - Locomotion: Wheelchair Distance: 150 x 2 Locomotion: Wheelchair: 1: Total Assistance/staff pushes wheelchair (Pt<25%) FIM - Locomotion: Ambulation Locomotion: Ambulation: 0: Activity did not occur  Comprehension Comprehension Mode: Auditory Comprehension: 5-Follows basic conversation/direction: With no assist  Expression Expression Mode: Verbal Expression: 4-Expresses basic 75 - 89% of the time/requires cueing 10 - 24% of the time. Needs helper to occlude trach/needs to repeat words.  Social Interaction Social Interaction Mode:  Asleep Social  Interaction: 4-Interacts appropriately 75 - 89% of the time - Needs redirection for appropriate language or to initiate interaction.  Problem Solving Problem Solving Mode: Not assessed Problem Solving: 4-Solves basic 75 - 89% of the time/requires cueing 10 - 24% of the time  Memory Memory: 5-Recognizes or recalls 90% of the time/requires cueing < 10% of the time  Medical Problem List and Plan: 1. 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  DVT Prophylaxis/Anticoagulation: Pharmaceutical: Lovenox 3. Neuropathy/Pain Management: .    - neurontin decreased due to neurosedation---more alert but pain increased  -increased LOW dose elavil to  qhs  - Kpad for right shoulder    4. Bipolar disorder/Mood:  Ego support, neuropsych assessment.   -continue xanax tid  -appreciate psych recs---agree with antidepressant---initated prozac 5. Neuropsych: This patient is capable of making decisions on her own behalf. 6. Skin/Wound Care: Pressure relief measures to prevent breakdown. Encourage boosting when OOB. Has very low protein stores--added nutritional supplement. 7. GBS: plasmapheresis completed per nephrology      8. Hyponatremia:  Improved-- 134 9. Hypokalemia: supplementing   10. Neurogenic bladder: continue foley due incontinence, generalize weakness.  - Has MRSA---     -contact precautions 11. Mild anemia: recheck CBC     12. Abnormal LFTs: Hepatitis panel negative.    -follow up  13. CV---  -imdur and metoprolol, holding prn  -HR still elevated due to deconditioning and anxiety  -TEDS/abd binder potentially  -encouraging fluids  - anemic still but hgb stable   LOS (Days) 19 A FACE TO FACE EVALUATION WAS PERFORMED  Tikita Mabee T 02/20/2014, 7:33 AM

## 2014-02-20 NOTE — Progress Notes (Signed)
Social Work Patient ID: Crystal Stein, female   DOB: [birth year], 43 y.o.   MRN: [id number]  Received SNF bed offer from [facility] [place] who can admit pt tomorrow.  Have discussed with pt and her father (via phone) with both in agreement with plan to transfer to facility via ambulance tomorrow.  Alerting tx team  Amada Jupiter, LCSW

## 2014-02-20 NOTE — Discharge Summary (Signed)
Physician Discharge Summary  Patient ID: Crystal Stein MRN: [id number] DOB/AGE: 27-Sep-1970 43 y.o.  Admit date: 02/01/2014 Discharge date: 02/21/2014  Discharge Diagnoses:  Principal Problem:   GBS (Guillain-Barre syndrome) Active Problems:   ANXIETY, CHRONIC   Sinus tachycardia   Quadriplegia and quadriparesis   Depression with anxiety   Neuropathic pain syndrome (non-herpetic)   Orthostatic hypotension   Discharged Condition: Stable.    Labs:  Basic Metabolic Panel:    Component Value Date/Time   NA 137 02/12/2014 0725   K 3.9 02/12/2014 0725   CL 101 02/12/2014 0725   CO2 23 02/12/2014 0725   GLUCOSE 78 02/12/2014 0725   BUN 9 02/12/2014 0725   CREATININE 0.34* 02/12/2014 0725   CALCIUM 9.6 02/12/2014 0725   GFRNONAA >90 02/12/2014 0725   GFRAA >90 02/12/2014 0725     CBC: CBC Latest Ref Rng 02/12/2014 02/09/2014 02/09/2014  WBC 4.0 - 10.5 K/uL 5.4 - 5.8  Hemoglobin 12.0 - 15.0 g/dL 4.0(J) 8.1(X) 8.9(L)  Hematocrit 36.0 - 46.0 % 28.7(L) 25.0(L) 26.2(L)  Platelets 150 - 400 K/uL 310 - 288    Filed Vitals:   02/20/14 1402 02/20/14 1426 02/20/14 2201 02/21/14 0509  BP: 104/72 102/49 102/77 116/79  Pulse: 111 107 121 113  Temp:  98.3 F (36.8 C) 97.9 F (36.6 C) 98.5 F (36.9 C)  TempSrc:  Oral  Oral  Resp:  17  20  Height:      Weight:    49.2 kg (108 lb 7.5 oz)  SpO2:  100% 100% 100%     Brief HPI:   Crystal Stein is a 43 y.o. female with history of depression with anxiety, recent NSTEMI, who was readmitted on 01/16/14 with recurrent chest pain as well as confusion. CTA chest negative for PE and new hepatomegaly and hepatic steatosis noted. UDS positive for marijuana and benzos. Cardiology consulted and felt that symptoms due to psychosocial issues. Patient reported difficulty walking and well as BLE weakness with numbness on 07/29 and MRI spine without mild HNP C6/7 otherwise negative.  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 She was treated with a round of IVIG without much improvement. Therefore neurology recommended PLEX for treatment. Confusion is resolving but she continues to have high levels of anxiety as well as tachycardia with CP. Foley remains in place due to urinary retention.  Therapy ongoing and patient with  high levels of anxiety, fear, BLE ataxia with neuropathy affecting mobility. CIR recommended for follow up therapy.    Hospital Course: Crystal Stein was admitted to rehab 02/01/2014 for inpatient therapies to consist of PT, ST and OT at least three hours five days a week. Past admission physiatrist, therapy team and rehab RN have worked together to provide customized collaborative inpatient rehab. She completed 5 courses of plasmapheresis without much improvement motor or sensory deficits. She has continued to have intermittent issues with CP likely due to tachycardia as well as anxiety. Multiple EKG as well as cyclic cardiac enzymes have been checked and were negative for ischemia. She is unable to tolerate BB or nitrates due to hypotension. Check of lytes show that hyponatremia has resolved and renal status is stable. Abnormal LFTs are resolving with negative hepatitis panel noted at admission. CBC has been monitored and acute on chronic anemia is slowly improving. Xanax has been scheduled tid to help with  anxiety and Neurontin was added with titration to current dose to help with neuropathy. She is unable to tolerate higher doses due to sedative SE.    Neuropsychologist has been following for support and patient's mood is showing improvement. She continues to suffer from mild to moderate depression and anxiety therefore Prozac was added on 09/01 to help with mood and to help with activation. Her progress and participation has been limited in part due to anxiety as well as poor memory and inability to carryover day  to day. She continues to require step by step cues for activity. Foley was removed on 09/01 and patient is voiding incontinently. Post void checks show volumes of 0 and 108 cc. Patient is incontinent of bowel and bladder. Recommend toileting patient every 4 hours with post void checks and perform in and out cath for volumes greater than 350 cc. Also recommend bowel program with suppository daily to prevent incontinent episodes. She continues to require significant assistance and her parents have elected on further therapies at SNF. Patient is discharged to [facility] on 02/21/14   Rehab course: During patient's stay in rehab weekly team conferences were held to monitor patient's progress, set goals and discuss barriers to discharge.Patient has had improvement in activity tolerance, balance, postural control, as well as ability to compensate for deficits. She is has had improvement in functional use RUE and LUE as well as improved attention, awareness and coordination. She requires supervision for UB ADLs, min assist for grooming and total assist with LB ADLs. She is able to roll in bed with cues for set up and can lift hips via bridge. She requires mod to max assist for SB transfers. She is able to propel the wheelchair 10' with min assist. She is standing in standing frame for 3 mins X 2 with complaints of orthostatic changes. Speech therapy has been working with patient on dysphonia as well as improving memory with use of compensatory strategies. Patient requires occasional cues to utilize these. She requires supervision to min assist for effective use of speech intelligibility strategies.    Disposition: 62-Rehab Facility  Diet: Regular  Special Instructions: 1. Boost every 20 minutes when in chair. 2. Toilet every 2-3 hours. Monitor voiding with PVR checks x 48 hours.  3. Recheck lytes in 5 days.     Medication List    STOP taking these medications       isosorbide mononitrate 30 MG 24 hr  tablet  Commonly known as:  IMDUR     metoprolol succinate 100 MG 24 hr tablet  Commonly known as:  TOPROL-XL      TAKE these medications       acetaminophen 500 MG tablet  Commonly known as:  TYLENOL  Take 500 mg by mouth every 6 (six) hours as needed for mild pain or moderate pain.     ALPRAZolam 0.5 MG tablet--Rx 15 pills   Commonly known as:  XANAX  Take 1 tablet (0.5 mg total) by mouth 3 (three) times daily.     amitriptyline 25 MG tablet  Commonly known as:  ELAVIL  Take 1 tablet (25 mg total) by mouth at bedtime.     diphenhydrAMINE 25 mg capsule  Commonly known as:  BENADRYL  Take 1 capsule (25 mg total) by mouth every 6 (six) hours as needed for itching ( or hives).     FLUoxetine 10 MG capsule  Commonly known as:  PROZAC  Take 1 capsule (10 mg total) by mouth daily.  gabapentin 300 MG capsule  Commonly known as:  NEURONTIN  Take 1 capsule (300 mg total) by mouth 3 (three) times daily.     ketotifen 0.025 % ophthalmic solution  Commonly known as:  ZADITOR  Place 1 drop into both eyes 2 (two) times daily.     levothyroxine 25 MCG tablet  Commonly known as:  SYNTHROID, LEVOTHROID  Take 1 tablet (25 mcg total) by mouth daily before breakfast.     methocarbamol 500 MG tablet  Commonly known as:  ROBAXIN  Take 1 tablet (500 mg total) by mouth every 6 (six) hours as needed for muscle spasms.     nitroGLYCERIN 0.4 MG SL tablet  Commonly known as:  NITROSTAT  Place 1 tablet (0.4 mg total) under the tongue every 5 (five) minutes x 3 doses as needed for chest pain.     omeprazole 20 MG capsule  Commonly known as:  PRILOSEC  Take 20 mg by mouth daily.     oxyCODONE 5 MG immediate release tablet--Rx 15 pills  Commonly known as:  Oxy IR/ROXICODONE  Take 2 tablets (10 mg total) by mouth every 6 (six) hours as needed for severe pain.     polyethylene glycol packet  Commonly known as:  MIRALAX / GLYCOLAX  Take 17 g by mouth at bedtime.     potassium chloride  SA 20 MEQ tablet  Commonly known as:  K-DUR,KLOR-CON  Take 1 tablet (20 mEq total) by mouth daily.     sodium chloride 0.65 % Soln nasal spray  Commonly known as:  OCEAN  Place 1 spray into both nostrils as needed for congestion (6 times a day).     sodium phosphate 7-19 GM/118ML Enem  Place 133 mLs (1 enema total) rectally daily as needed for severe constipation.     traMADol 50 MG tablet--Rx # 30 pills  Commonly known as:  ULTRAM  Take 1 tablet (50 mg total) by mouth every 6 (six) hours as needed for moderate pain.     traZODone 50 MG tablet  Commonly known as:  DESYREL  Take 1 tablet (50 mg total) by mouth at bedtime.       Follow-up Information   Follow up with [name], MD.   Specialty:  Physical Medicine and Rehabilitation   Contact information:   [address], [address] [place] [address] [phone]       Follow up with [facility].   Contact information:   [address] [place] [address] [phone]      Signed: Jacquelynn Cree 02/21/2014, 8:48 AM

## 2014-02-20 NOTE — Progress Notes (Signed)
Physical Therapy Discharge Summary  Patient Details  Name: Crystal Stein MRN: [id number] Date of Birth: 07-07-1970   Patient has met 5 of 6 long term goals due to improved activity tolerance, improved balance, improved postural control, decreased pain and ability to compensate for deficits.  Patient to discharge at a wheelchair level mod to max A and currently using a sliding board for level transfers. Pt requires lift equipment for any standing activities. Initiated limited w/c propulsion in manual w/c today. Pt uses signs posted in room to recall need for pressure relief when up in w/c. Patient's family unable to provide the necessary physical assistance at discharge. Pt is discharging to SNF.  Reasons goals not met: w/c mobility goal not met as pt unable to travel 55' with min A   Recommendation:  Patient will benefit from ongoing skilled PT services in skilled nursing facility setting to continue to advance safe functional mobility, address ongoing impairments in strength, balance, endurance, quadriparesis, abnormal tone, impaired sensation, decreased endurance, decreased memory, and minimize fall risk.  Equipment: No equipment provided. Defer to next venue of care.  Reasons for discharge: discharge from hospital  Patient/family agrees with progress made and goals achieved: Yes  PT Discharge Precautions/Restrictions Precautions Precautions: Fall Precaution Comments: bilateral ue/le, fear of falling  Restrictions Weight Bearing Restrictions: No Vision/Perception  Perception Perception: Within Functional Limits  Cognition Arousal/Alertness: Awake/alert Orientation Level: Oriented X4 (patient with memory deficits) Attention: Sustained Sustained Attention: Appears intact Memory: Impaired Memory Impairment: Decreased recall of new information;Decreased short term memory Awareness: Impaired Awareness Impairment: Anticipatory impairment Problem Solving: Appears  intact Safety/Judgment: Appears intact Sensation Sensation Light Touch: Impaired Detail Light Touch Impaired Details: Impaired RLE;Impaired LLE Proprioception: Impaired Detail Proprioception Impaired Details: Absent LLE;Absent RLE Coordination Gross Motor Movements are Fluid and Coordinated: No Motor  Motor Motor: Tetraplegia;Ataxia;Abnormal postural alignment and control;Abnormal tone  Trunk/Postural Assessment  Cervical Assessment Cervical Assessment: Within Functional Limits Thoracic Assessment Thoracic Assessment: Exceptions to [facility] (kyphotic) Lumbar Assessment Lumbar Assessment: Exceptions to [facility] (posterior pelvic tilt) Postural Control Postural Control: Deficits on evaluation  Balance Balance Balance Assessed: Yes Static Sitting Balance Static Sitting - Level of Assistance: 5: Stand by assistance Dynamic Sitting Balance Dynamic Sitting - Level of Assistance: 5: Stand by assistance Static Standing Balance Static Standing - Level of Assistance: 1: +1 Total assist (with lift equipment) Extremity Assessment  RUE Assessment RUE Assessment: Exceptions to [facility] RUE AROM (degrees) Overall AROM Right Upper Extremity: Deficits RUE Overall AROM Comments: Patient's shoulder and elbow ROM is WFL, patient with decreased AROM elbow>distally. See below for strength testing.  Right Shoulder Extension: 110 Degrees RUE Strength RUE Overall Strength: Deficits RUE Overall Strength Comments: proximal 3/5  distally 2-/5 LUE Assessment LUE Assessment: Exceptions to WFL LUE AROM (degrees) Overall AROM Left Upper Extremity: Deficits LUE Overall AROM Comments: limited by fatigue, patient with pre-morbid decreased AROM secondary to shoulder surgery in past Left Shoulder Extension: 110 Degrees Left Wrist Extension: 15 Degrees Left Wrist Flexion:  (remains in flexion) LUE Strength LUE Overall Strength: Deficits LUE Overall Strength Comments: proximally 3/5   distally 2-5  RLE  Assessment RLE Assessment: Exceptions to WFL (Tight HS and heel cords, painful to stretch) RLE AROM (degrees) Overall AROM Right Lower Extremity: Deficits RLE Overall AROM Comments: Grossly 2+/5 throughout  RLE PROM (degrees) Overall PROM Right Lower Extremity: Deficits RLE Overall PROM Comments: 2+/5 overall RLE Strength RLE Overall Strength: Deficits RLE Overall Strength Comments: Groslly 2-2+/5 throughout except ankle 1+/5 Right Hip Flexion: 2+/5  Right Hip Extension: 2+/5 Right Hip ABduction: 3-/5 Right Hip ADduction: 2/5 Right Knee Flexion: 2+/5 Right Knee Extension: 2-/5 Right Ankle Dorsiflexion: 1/5 Right Ankle Plantar Flexion: 1/5 LLE Assessment LLE Assessment: Exceptions to WFL (Tight HS and heel cords, painful to stretch) LLE Strength LLE Overall Strength: Deficits LLE Overall Strength Comments: Grossly 2-2+/5 throughout except ankle 1+/5 Left Hip Flexion: 2+/5 Left Hip Extension: 2+/5 Left Hip ABduction: 3-/5 Left Hip ADduction: 2/5 Left Knee Flexion: 2/5 Left Knee Extension: 1/5 Left Ankle Dorsiflexion: 1/5 Left Ankle Plantar Flexion: 1/5    See FIM for current functional status  Canary Brim [facility] 02/20/2014, 3:46 PM

## 2014-02-21 ENCOUNTER — Encounter (HOSPITAL_COMMUNITY): Payer: Self-pay | Admitting: *Deleted

## 2014-02-21 DIAGNOSIS — G8250 Quadriplegia, unspecified: Secondary | ICD-10-CM

## 2014-02-21 DIAGNOSIS — I951 Orthostatic hypotension: Secondary | ICD-10-CM

## 2014-02-21 DIAGNOSIS — M792 Neuralgia and neuritis, unspecified: Secondary | ICD-10-CM

## 2014-02-21 DIAGNOSIS — F418 Other specified anxiety disorders: Secondary | ICD-10-CM | POA: Diagnosis present

## 2014-02-21 NOTE — Progress Notes (Signed)
At 0315 incontinent void, PVR=0! Crystal Stein A

## 2014-02-21 NOTE — Patient Care Conference (Signed)
Inpatient RehabilitationTeam Conference and Plan of Care Update Date: 02/20/2014   Time: 2:00 PM    Patient Name: Crystal Stein      Medical Record Number: [id number]  Date of Birth: Mar 06, 1971 Sex: Female         Room/Bed: 4W03C/4W03C-01 Payor Info: Payor: MEDICAID PENDING / Plan: MEDICAID PENDING / Product Type: *No Product type* /    Admitting Diagnosis: gbs  Admit Date/Time:  02/01/2014  4:00 PM Admission Comments: No comment available   Primary Diagnosis:  <principal problem not specified> Principal Problem: <principal problem not specified>  Patient Active Problem List   Diagnosis Date Noted  . 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 Guillain Barr syndrome 01/21/2014  . Quadriplegia and quadriparesis 01/21/2014  . Bilateral leg weakness 01/20/2014  . Hyperkalemia 01/19/2014  . UTI (urinary tract infection) 01/19/2014  . Hypokalemia 01/18/2014  . 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 Acute encephalopathy 01/17/2014  . Substance abuse 01/17/2014  . Sinus tachycardia 01/17/2014  . Elevated transaminase level 01/17/2014  . Metabolic acidosis 01/17/2014  . Chest pressure 01/16/2014  . Chest pain 01/16/2014  . Tobacco abuse 01/12/2014  . Dyslipidemia 01/12/2014  . NSTEMI (non-ST elevated myocardial infarction) 01/10/2014  . Left ankle sprain 11/28/2012  . Carpal tunnel syndrome of left wrist 11/15/2012  . Left ankle instability 11/15/2012  . Muscle weakness (generalized) 08/19/2012  . Wrist fracture 08/15/2012  . Wrist pain, left 07/01/2012  . Edema of hand 07/01/2012  . Radius distal fracture 06/16/2012  . Ankle sprain 11/04/2011  . OTHER ABNORMAL BLOOD CHEMISTRY 01/22/2009  . LIVER FUNCTION TESTS, ABNORMAL, HX OF 01/16/2009  . ANEMIA 09/13/2008  . DIARRHEA NOS 09/13/2008  . ABDOMINAL PAIN, GENERALIZED 09/13/2008  . ABDOMINAL PAIN, LOWER 09/13/2008  . ABNORMAL TRANSAMINASE, (LFT'S) 09/13/2008  . RUQ PAIN 08/17/2008  . ACUTE SINUSITIS, UNSPECIFIED  07/23/2008  . ACUTE BRONCHITIS 07/23/2008  . LATERAL EPICONDYLITIS 04/25/2008  . CUBITAL TUNNEL SYNDROME 02/07/2008  . Pain in Joint, Upper Arm 02/07/2008  . CLOSED FRACTURE OF SURGICAL NECK OF HUMERUS 02/07/2008  . BIPOLAR DISORDER UNSPECIFIED 12/02/2007  . ANXIETY, CHRONIC 12/02/2007  . NICOTINE ADDICTION 12/02/2007  . NECK PAIN, CHRONIC 12/02/2007    Expected Discharge Date: Expected Discharge Date: 02/21/14 (to SNF)  Team Members Present: Physician leading conference: Dr. Faith Rogue Social Worker Present: Amada Jupiter, LCSW;Jenny Prevatt, LCSW Nurse Present: Carlean Purl, RN PT Present: Hosie Spangle, Grayland Ormond, PT OT Present: Donzetta Kohut, OT;Izabell Katrinka Blazing, OT SLP Present: Fae Pippin, SLP PPS Coordinator present : Tora Duck, RN, CRRN     Current Status/Progress Goal Weekly Team Focus  Medical   ongoing pain issues. anxiety up and down  see prior  dc planning   Bowel/Bladder   Continent of bowel LBM 02/18/14./foley catheter due to neurogenic bladder  Remain continent of bowel and and manage bladder with min assist.  assess bowel and bladder q shift   Swallow/Nutrition/ Hydration             ADL's   supervision for UB dressing, max assist for LB dressing, patient on evening bath, max assist slide board transfers  3 LTGs downgraded and 2 LTG discharged.  S: self feeding, MIN: bath, UB dress, groom & toilet transfer, MAX: LB dress and toileting  ADL retraining, memory, activity tolerance/endurance, directing care, BUE functional use   Mobility   mod to max A overall; lift equipment for standing  mod A transfers, min A w/c propulsion, S sitting balance, total A standing with lift equipment  transfers, changing w/c to manual chair, sitting balance, bed mobility, pressure relief, standing with lift equipment   Communication   Min-Mod assist   Min cuing to improve speech intelligibility to ~80% accurate at the phrase level   self-monitoring and carryover     Safety/Cognition/ Behavioral Observations  Supervision   Min cuing   Use of compensatory strategies for memory   Pain   c/o pain to bil lower legs, feet and hands, OxyIR 5-to mg q shift PRN, Tramadol  q 6 hrs,, Robain 500 mg q 6 hr, Tylenol 650 mg  Pain lebel 3 or less on a scale of 0-10.  Monitor any onset of pain.q shift.   Skin   Turn q 2 hrs., fissure betwwn buttocks barrier cream applied.  No new skin breakdown/infection this admission.  Assess skin q shift.    Rehab Goals Patient on target to meet rehab goals: No *See Care Plan and progress notes for long and short-term goals.  Barriers to Discharge: see prior, poor activity tolerance    Possible Resolutions to Barriers:  snf for further care    Discharge Planning/Teaching Needs:  plan changed to SNF as family cannot provide needed assistance.  pt and family in agreement with change      Team Discussion:  Team aware pt to d/c to SNF tomorrow and feel she is medically ready to do so.  Pain and anxiety continue to be barriers to progress.  Revisions to Treatment Plan:  NA   Continued Need for Acute Rehabilitation Level of Care: The patient requires daily medical management by a physician with specialized training in physical medicine and rehabilitation for the following conditions: Daily direction of a multidisciplinary physical rehabilitation program to ensure safe treatment while eliciting the highest outcome that is of practical value to the patient.: Yes Daily medical management of patient stability for increased activity during participation in an intensive rehabilitation regime.: Yes Daily analysis of laboratory values and/or radiology reports with any subsequent need for medication adjustment of medical intervention for : Pulmonary problems;Neurological problems  Lucca Greggs 02/21/2014, 6:23 AM

## 2014-02-21 NOTE — Progress Notes (Signed)
Patient discharged to [facility] in [place], transported via ambulance. All patient's belongings packed and taken with her. Called report to [place], Chiropodist at [employer]. [place]

## 2014-02-21 NOTE — Progress Notes (Signed)
Social Work  Discharge Note  The overall goal for the admission was met for:   Discharge location: No - plan changed to SNF  Length of Stay: Yes  Discharge activity level: No  Home/community participation: No  Services provided included: MD, RD, PT, OT, SLP, RN, TR, Pharmacy, Neuropsych and SW  Financial Services: Other: Medicaid pending  Follow-up services arranged: Other: SNF @ [facility] (or additional information):  Patient/Family verbalized understanding of follow-up arrangements: Yes  Individual responsible for coordination of the follow-up plan: patient  Confirmed correct DME delivered: NA    Crystal Stein

## 2014-02-21 NOTE — Progress Notes (Addendum)
Subjective/Complaints: 43 y.o. female with history of depression with anxiety, recent NSTEMI, who was readmitted on 01/16/14 with recurrent chest pain as well as confusion. CTA chest negative for PE and new hepatomegaly and hepatic steatosis noted. UDS positive for marijuana and benzos. Cardiology consulted and felt that symptoms due to psychosocial issues. Patient reported difficulty walking and well as BLE weakness on 07/29 and MRI lumbar spine without disc herniation or stenosis. 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 She was treated with a round of IVIG without much improvement.   No new issues. Pain/anxiety persistent    Objective: Vital Signs: Blood pressure 116/79, pulse 113, temperature 98.5 F (36.9 C), temperature source Oral, resp. rate 20, height  (1.626 m), weight 49.2 kg (108 lb 7.5 oz), last menstrual period 04/23/2011, SpO2 100.00%. No results found. No results found for this or any previous visit (from the past 72 hour(s)).  Gen: no distress HEENT: normal Cardio: tachy and no murmur Resp: CTA B/L and unlabored GI: BS positive and NT,ND Extremity:  Pulses positive and No Edema Skin:   Intact Neuro: 3/5 bilateral deltoids  3 minus/5 bilateral bicep  2 minus/5 bilateral tricep  3 minus/5 left finger flexors  1+ grip and wrist extension left , 1 on Right   2 minus/5 right hip flexors   1 left hip flexors   2- Right and 1/5 Left knee extensors  2- to 2+/5 bilateral hip extensors  Trace movement at ankles persists Decreased sense to PP/LT stocking glove bilateral UE and LE's Psych: flat, anxious at times    Assessment/Plan: 1. 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 Physiatrist is providing close team supervision and 24 hour management of active medical problems listed below. Physiatrist and rehab team continue to assess barriers to discharge/monitor patient progress toward functional and medical goals.   To SNF today  FIM: FIM - Bathing Bathing Steps Patient Completed: Abdomen;Right Arm;Chest;Left Arm;Right upper leg;Left upper leg Bathing: 3: Mod-Patient completes 5-7 64f 10 parts or 50-74%  FIM - Upper Body Dressing/Undressing Upper body dressing/undressing steps patient completed: Thread/unthread right bra strap;Thread/unthread left bra strap;Thread/unthread right sleeve of pullover shirt/dresss;Thread/unthread left sleeve of pullover shirt/dress Upper body dressing/undressing: 3: Mod-Patient completed 50-74% of tasks FIM - Lower Body Dressing/Undressing Lower body dressing/undressing: 1: Total-Patient completed less than 25% of tasks  FIM - Toileting Toileting: 1: Two helpers  FIM - Diplomatic Services operational officer Devices: Bedside commode (and use of stedy) Toilet Transfers: 0-Activity did not occur  FIM - Banker Devices: Sliding board;Arm rests Bed/Chair Transfer: 2: Sit > Supine: Max A (lifting assist/Pt. 25-49%);2: Supine > Sit: Max A (lifting assist/Pt. 25-49%);2: Bed > Chair or W/C: Max A (lift and lower assist);2: Chair or W/C > Bed: Max A (lift and lower assist)  FIM - Locomotion: Wheelchair Distance: 150 x 2 Locomotion: Wheelchair: 1: Total Assistance/staff pushes wheelchair (Pt<25%) FIM - Locomotion: Ambulation Locomotion: Ambulation: 0: Activity did not occur  Comprehension Comprehension Mode: Asleep Comprehension: 5-Understands basic 90% of the time/requires cueing < 10% of the time  Expression Expression Mode: Asleep Expression: 5-Expresses basic needs/ideas: With extra time/assistive device  Social  Interaction Social Interaction  Mode: Asleep Social Interaction: 4-Interacts appropriately 75 - 89% of the time - Needs redirection for appropriate language or to initiate interaction.  Problem Solving Problem Solving Mode: Asleep Problem Solving: 4-Solves basic 75 - 89% of the time/requires cueing 10 - 24% of the time  Memory Memory Mode: Asleep Memory: 4-Recognizes or recalls 75 - 89% of the time/requires cueing 10 - 24% of the time  Medical Problem List and Plan: 1. 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  DVT Prophylaxis/Anticoagulation: Pharmaceutical: Lovenox 3. Neuropathy/Pain Management: .    - neurontin decreased due to neurosedation---more alert but pain increased  - elavil   qhs  -     4. Bipolar disorder/Mood:  Ego support, neuropsych assessment.   -continue xanax tid  -appreciate psych recs---agree with antidepressant---initated prozac 5. Neuropsych: This patient is capable of making decisions on her own behalf. 6. Skin/Wound Care: Pressure relief measures to prevent breakdown. Encourage boosting when OOB. Has very low protein stores--added nutritional supplement. 7. GBS: plasmapheresis completed per nephrology      8. Hyponatremia:  Improved-- 134 9. Hypokalemia: supplementing   10. Neurogenic bladder: continue foley due incontinence, generalize weakness.  - Has MRSA---     -contact precautions 11. Mild anemia: recheck CBC     12. Abnormal LFTs: Hepatitis panel negative.    -follow up  13. CV---  -imdur and metoprolol, holding prn  -HR still elevated due to deconditioning and anxiety  -TEDS/abd binder potentially  -encouraging fluids  - anemic still but hgb stable 14. Moderate protein malnutrition---encouraging better PO intake, supps   LOS (Days) 20 A FACE TO FACE EVALUATION WAS PERFORMED  Jacen Carlini T 02/21/2014, 8:01 AM

## 2014-02-21 NOTE — Progress Notes (Signed)
At 2310 patient incontinent of large amount of urine. PVR=108. Incontinent of small stool at that time. Crystal Stein

## 2014-02-22 ENCOUNTER — Encounter (HOSPITAL_COMMUNITY): Payer: Self-pay | Admitting: *Deleted

## 2014-02-23 ENCOUNTER — Encounter (HOSPITAL_COMMUNITY): Payer: Self-pay | Admitting: *Deleted

## 2014-02-27 ENCOUNTER — Encounter (HOSPITAL_COMMUNITY): Payer: Self-pay | Admitting: *Deleted

## 2014-02-28 ENCOUNTER — Encounter (HOSPITAL_COMMUNITY): Payer: Self-pay | Admitting: *Deleted

## 2014-03-01 ENCOUNTER — Encounter (HOSPITAL_COMMUNITY): Payer: Self-pay | Admitting: *Deleted

## 2014-03-02 ENCOUNTER — Encounter (HOSPITAL_COMMUNITY): Payer: Self-pay | Admitting: *Deleted

## 2014-03-05 ENCOUNTER — Encounter (HOSPITAL_COMMUNITY): Payer: Self-pay | Admitting: *Deleted

## 2014-03-06 ENCOUNTER — Encounter (HOSPITAL_COMMUNITY): Payer: Self-pay | Admitting: *Deleted

## 2014-03-07 ENCOUNTER — Encounter (HOSPITAL_COMMUNITY): Payer: Self-pay | Admitting: *Deleted

## 2014-03-08 ENCOUNTER — Encounter (HOSPITAL_COMMUNITY): Payer: Self-pay | Admitting: *Deleted

## 2014-03-09 ENCOUNTER — Encounter (HOSPITAL_COMMUNITY): Payer: Self-pay | Admitting: *Deleted

## 2014-03-12 ENCOUNTER — Encounter (HOSPITAL_COMMUNITY): Payer: Self-pay | Admitting: *Deleted

## 2014-03-13 ENCOUNTER — Encounter (HOSPITAL_COMMUNITY): Payer: Self-pay | Admitting: *Deleted

## 2014-04-05 ENCOUNTER — Ambulatory Visit (INDEPENDENT_AMBULATORY_CARE_PROVIDER_SITE_OTHER): Payer: Self-pay | Admitting: Neurology

## 2014-04-05 ENCOUNTER — Encounter: Payer: Self-pay | Admitting: Neurology

## 2014-04-05 VITALS — BP 98/82 | HR 105

## 2014-04-05 DIAGNOSIS — E039 Hypothyroidism, unspecified: Secondary | ICD-10-CM

## 2014-04-05 DIAGNOSIS — F411 Generalized anxiety disorder: Secondary | ICD-10-CM

## 2014-04-05 DIAGNOSIS — F32A Depression, unspecified: Secondary | ICD-10-CM

## 2014-04-05 DIAGNOSIS — F1290 Cannabis use, unspecified, uncomplicated: Secondary | ICD-10-CM

## 2014-04-05 DIAGNOSIS — Z597 Insufficient social insurance and welfare support: Secondary | ICD-10-CM

## 2014-04-05 DIAGNOSIS — G610 Guillain-Barre syndrome: Secondary | ICD-10-CM

## 2014-04-05 DIAGNOSIS — F1220 Cannabis dependence, uncomplicated: Secondary | ICD-10-CM

## 2014-04-05 DIAGNOSIS — F329 Major depressive disorder, single episode, unspecified: Secondary | ICD-10-CM

## 2014-04-05 DIAGNOSIS — F17200 Nicotine dependence, unspecified, uncomplicated: Secondary | ICD-10-CM

## 2014-04-05 DIAGNOSIS — Z720 Tobacco use: Secondary | ICD-10-CM

## 2014-04-05 NOTE — Progress Notes (Addendum)
[facility]    Provider:  Dr Jaynee Eagles Referring Provider: Meredith Staggers, MD Primary Care Physician:  No PCP Per Patient  CC:  GBS  HPI:  Crystal Stein is a 43 y.o. female here as a referral from Dr. Naaman Plummer for follow up of hosiptal admission for GBS  PMhx of psychiatric disorder, COPD (current smoker), hypothyroidism, HTN who was admitted to [facility] in July for MI. Discharged from [facility] and then a few days later she "fell out" at the house and went back to [facility] hospital. She just got up out of bed and fell down because her legs would not work. Previous to August she was strong, walking and driving.  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 Plasmapheresis and IVIG didn't work. Still can't walk. Arms are improving but legs are still weak. She still can't grab things, weak grip. Numbness and tingling in the hands which started with the GBS. Lots of muscle pain. Numbness from the toes all the way up the legs like the nerve ending are firing "rah rah rah" until she takes a pain pill. More aggravating than painful. Can't sleep. No SOB. No vision changes. Poor historian, can't give me details on where the symptoms started (feet?) or how the symptoms developed(ascending?). Previous to admission for MI doesn't recall illnesses or diarrhea, she was strong.   Doesn't know specific dates or any other details. Father provides some additional history. No FHx of neuromuscular disorders.   Reviewed notes, labs and imaging since 12/2013 to date from outside physicians, which showed: 01/10/2014 positive for benzos and THC, HIV neg, B12 812 in 12/2013, RPR neg, CSF culture negative, cell count with diff 0 WBC, CSF crypto neg, CSF VDRL neg, protein 9, glucose 61,  Myasthenia Gravis panel negative, ESR 70, acute help panel negative, heavy metals blood negative. Personally reviewed MR of the lumbar spine and cervical spine which did not show any significant stenosis and did not show  any cord enhancement or any results to explain her symptoms.   Patient admitted 01/10/2014 and discharged 01/12/2014. She was admitted with an NSTEMI after 5 days of chest pain. She was placed on IV heparin and went for a left heart cath which revealed angiographically normal Coronary Arteries with no obvious culprit lesion. Extremely tortuous vessels with distal tapering that are easy potential targets for spasm. Hyperdynamic left ventricular function with normal EF and normal LVEDP. Imdur added. Statin was DCd. Some mild hypotension and tachycardia at rest with no tachypnea. Potassium was replaced to WNL. Tobacco cessation was discussed. Low normal EF by echo and normal RV size and thickness. Lopressor was started at 61m BID.   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 Discharge states that patient failed to improve on IVIG and was then treated with plasmapheresis x 5 and a 5 day treatment of steroids again without much improvement. Unclear motor exam on admission, H&P only states "LE weakness bilaterally". Admission to rehab 8/13/ states: Hypersensitivity chest wall and dysesthesias RLE>LLE. UES: 3/5 deltoid, bicep, tricep, 3- HI, LE's 1/5 HF, KE and ankles. Decreased sensation in both hand and LE's. DTR's absent.     Review of Systems: Patient complains of symptoms per HPI as well as the following symptoms weight loss, cough, feeling cold, aching muscles, allergies, skin sensitivity, numbness, weakness, insomnia, depression, anxiety, not enough sleep. Pertinent negatives per HPI. All others  negative.   History   Social History  . Marital Status: Legally Separated    Spouse Name: N/A    Number of Children: 3  . Years of Education: HS   Occupational History  .     Social History Main Topics  . Smoking status: Current Every Day Smoker -- 1.00 packs/day for 22 years    Types:  Cigarettes  . Smokeless tobacco: Never Used  . Alcohol Use: No  . Drug Use: No  . Sexual Activity: Not on file   Other Topics Concern  . Not on file   Social History Narrative   Patient lives at [facility].   Caffeine Use: Sodas daily    Family History  Problem Relation Age of Onset  . Anesthesia problems Neg Hx   . Hypotension Neg Hx   . Malignant hyperthermia Neg Hx   . Pseudochol deficiency Neg Hx   . Diabetes      Past Medical History  Diagnosis Date  . Anxiety   . Depression   . Asthma   . 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 COPD (chronic obstructive pulmonary disease)   . GERD (gastroesophageal reflux disease)   . Bipolar disorder   . Blood transfusion 2006  . History of cardiac catheterization July 2015    Angiographically normal coronary arteries with distal tortuosity  . NSTEMI (non-ST elevated myocardial infarction)     Not secondary to obstructive CAD or obvious plaque rupture, possibly vasospasm    Past Surgical History  Procedure Laterality Date  . Tennis elbow release  04/27/06    Left; [facility], [facility]  . Multiple tooth extractions  10/07    Full upper  . Orif proximal humerus fracture  08/06    Left,  . Dilation and curettage of uterus  June 2012    Failed ablation  . Tubal ligation  June 2012    [facility]  . Bipolar disorder    . Hysteroscopy  01/27/2011    Procedure: HYSTEROSCOPY;  Surgeon: Jonnie Kind, MD;  Location: [facility];  Service: Gynecology;  Laterality: N/A;  . Vaginal hysterectomy  05/19/2011    Procedure: HYSTERECTOMY VAGINAL;  Surgeon: Jonnie Kind, MD;  Location: [facility];  Service: Gynecology;  Laterality: N/A;  . Orif wrist fracture  06/17/2012    Procedure: OPEN REDUCTION INTERNAL FIXATION (ORIF) WRIST FRACTURE;  Surgeon: Carole Civil, MD;  Location: [facility];  Service: Orthopedics;  Laterality: Left;  . Carpal tunnel release  06/17/2012    Procedure: CARPAL TUNNEL RELEASE;  Surgeon: Carole Civil, MD;  Location: [facility];  Service: Orthopedics;  Laterality: Left;    Current Outpatient Prescriptions  Medication Sig Dispense Refill  . acetaminophen (TYLENOL) 500 MG tablet Take 500 mg by mouth every 6 (six) hours as needed for mild pain or moderate pain.      Marland Kitchen ALPRAZolam (XANAX) 0.5 MG tablet Take 1 tablet (0.5 mg total) by mouth 3 (three) times daily.  15 tablet  0  . amitriptyline (ELAVIL) 25 MG tablet Take 1 tablet (25 mg total) by mouth at bedtime.      . bisacodyl (DULCOLAX) 5 MG EC tablet Take 10 mg by mouth 2 (two) times daily as needed for moderate constipation.      . diphenhydrAMINE (BENADRYL) 25 mg capsule Take 1 capsule (25 mg total) by mouth every 6 (six) hours as needed for itching ( or hives).  30 capsule  0  .  docusate sodium (COLACE) 100 MG capsule Take 100 mg by mouth daily.      . ferrous sulfate 325 (65 FE) MG tablet Take 325 mg by mouth 3 (three) times daily.      Marland Kitchen FLUoxetine (PROZAC) 10 MG capsule Take 1 capsule (10 mg total) by mouth daily.    3  . gabapentin (NEURONTIN) 300 MG capsule Take 1 capsule (300 mg total) by mouth 3 (three) times daily.      Marland Kitchen guaifenesin (ROBAFEN) 100 MG/5ML syrup Take 10 mLs by mouth every 6 (six) hours as needed for cough.      Marland Kitchen ketotifen (ZADITOR) 0.025 % ophthalmic solution Place 1 drop into both eyes 2 (two) times daily.  5 mL  0  . levothyroxine (SYNTHROID, LEVOTHROID) 25 MCG tablet Take 1 tablet (25 mcg total) by mouth daily before breakfast.      . methocarbamol (ROBAXIN) 500 MG tablet Take 1 tablet (500 mg total) by mouth every 6 (six) hours as needed for muscle spasms.  15 tablet  0  . nitroGLYCERIN (NITROSTAT) 0.4 MG SL tablet Place 1 tablet (0.4 mg total) under the tongue every 5 (five) minutes x 3 doses as needed for chest pain.  25 tablet  12  . omeprazole (PRILOSEC) 20 MG capsule Take 20 mg by mouth daily.        Marland Kitchen oxyCODONE (OXY IR/ROXICODONE) 5 MG immediate release tablet Take 2 tablets (10 mg total) by mouth every  6 (six) hours as needed for severe pain.  15 tablet  0  . polyethylene glycol (MIRALAX / GLYCOLAX) packet Take 17 g by mouth at bedtime.  14 each  0  . potassium chloride SA (K-DUR,KLOR-CON) 20 MEQ tablet Take 1 tablet (20 mEq total) by mouth daily.      . sodium chloride (OCEAN) 0.65 % SOLN nasal spray Place 1 spray into both nostrils as needed for congestion (6 times a day).    0  . sodium phosphate (FLEET) 7-19 GM/118ML ENEM Place 133 mLs (1 enema total) rectally daily as needed for severe constipation.    0  . traMADol (ULTRAM) 50 MG tablet Take 1 tablet (50 mg total) by mouth every 6 (six) hours as needed for moderate pain.  30 tablet  0  . traZODone (DESYREL) 100 MG tablet Take 100 mg by mouth at bedtime.       No current facility-administered medications for this visit.    Allergies as of 04/05/2014  . (No Known Allergies)    Vitals: BP 98/82  Pulse 105  LMP 04/23/2011 Last Weight:  Wt Readings from Last 1 Encounters:  02/21/14 108 lb 7.5 oz (49.2 kg)   Last Height:   Ht Readings from Last 1 Encounters:  02/01/14 _0  (1.626 m)      Physical exam: Exam: Gen: NAD, conversant, well nourised, well groomed, flat affect                CV: RRR, no MRG. No Carotid Bruits. No peripheral edema, warm, nontender Eyes: Conjunctivae clear without exudates or hemorrhage  Neuro: Detailed Neurologic Exam  Speech:    Speech is normal; fluent and spontaneous with normal comprehension.  Cognition: MoCA 18/30     The patient is oriented to person, place, and time;     recent and remote memory impaired;     language fluent;     Impaired attention, concentration,    fund of knowledge Cranial Nerves:    The pupils are equal,  round, and reactive to light. The fundi are normal and spontaneous venous pulsations are present. Visual fields are full to finger confrontation. Extraocular movements are intact. Trigeminal sensation is intact and the muscles of mastication are normal. The face  is symmetric. The palate elevates in the midline. Voice is normal. Shoulder shrug is normal. The tongue has normal motion without fasciculations.   Coordination:   Can do FTN with some difficulty, can't HTS due to weakness  Gait:   In wheelchair. Can't bear weight independently.  Motor Observation:    No asymmetry, no atrophy, and no involuntary movements noted.  Tone:    Low tone  Posture:    Posture is normal.     Strength: Deltoids 4+ bilaterally otherwise UE 3/5 Bilat hip flexion 3/5 otherwise 0-1/5 in the LE     Sensation: intact to LT bilaterally in the extremities bilaterally     Reflex Exam:  DTR's: Absent lowers 1+ biceps Toes:    The toes are equivocal bilat Clonus:    Clonus is absent.       Assessment/Plan:    Crystal Stein is a 43 y.o. female here as a referral from Dr. Naaman Plummer for follow up after hosiptal admission.  PMhx of psychiatric disorder and social difficulties, COPD (current smoker), hypothyroidism, HTN who was admitted to [facility] in July of this year for NSTEMI workup and then readmitted a few days after discharge for progressive numbness (BLE to chest and bilateral hands) with weakness BUE/BLE and areflexia. 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 Discharge states that patient failed to improve on IVIG and was then treated with plasmapheresis x 5 and a 5 day treatment of steroids again without much improvement. Unclear motor exam on admission, H&P only states "LE weakness bilaterally". Admission to rehab 02/01/14 states: Hypersensitivity chest wall and dysesthesias RLE>LLE. UES: 3/5 deltoid, bicep, tricep, 3- HI, LE's 1/5 HF, KE and ankles. Decreased sensation in both hand and LE's. DTR's absent.  LP did not show elevated protein however may have been too early (only approx. 50% show albuminocytologic dissociation in the first week). No EMG/NCS was performed. Was discharged to rehabilitation with some improvement reported. Poor historian and has many  social issues, is here with her father and does not have insurance. She is applying for Medicaid and hopefully she will be approved as she will most benefit from ongoing physical therapy. Currently patient states improvement as did rehabilitation discharge summary. Todays exam does show improvement in both strength and sensation. Will have to follow clinically, if worsens or has relapses can consider CIDP with treatment however given patient's social situation and lack of insurance it may be difficult to distinguish true worsening from disuse due to poor support and resources. About 2 percent of patients initially diagnosed with AIDP will develop CIDP.  At this point the most important thing we can do for patient is help her get insurance and the continuing physical therapy and resources she needs. Will follow up with patient and father. When she gets insurance will need emg/ncs as well as possibly more imaging and lab tests - they are financially unable to do any of this currently. Continue Neurontin for sensory complaints.  Sarina Ill, MD  [facility] [address] [place], [place] [address]  Phone [phone] Fax [phone]

## 2014-04-09 ENCOUNTER — Encounter: Payer: Self-pay | Admitting: Neurology

## 2014-04-09 DIAGNOSIS — F1290 Cannabis use, unspecified, uncomplicated: Secondary | ICD-10-CM | POA: Insufficient documentation

## 2014-04-10 ENCOUNTER — Telehealth: Payer: Self-pay | Admitting: Neurology

## 2014-04-10 NOTE — Telephone Encounter (Signed)
Spoke to father at length this evening, Mr Crystal Stein. They are trying to get Medicaid approved unfortunately per father this program does not pay for continuing physical therapy. At this point the most important thing we can do for patient is help her get insurance and the continuing physical therapy and resources she needs. PT will be critical for her improvement. Will discuss with office and see if there are any resources or any way we can help patient.

## 2014-05-01 ENCOUNTER — Encounter: Payer: Medicaid Other | Admitting: Physical Medicine & Rehabilitation

## 2014-05-31 ENCOUNTER — Encounter (HOSPITAL_COMMUNITY): Payer: Self-pay | Admitting: Cardiology

## 2014-08-06 DIAGNOSIS — Z0289 Encounter for other administrative examinations: Secondary | ICD-10-CM

## 2014-12-03 ENCOUNTER — Ambulatory Visit (HOSPITAL_COMMUNITY): Payer: Medicaid Other | Attending: Family Medicine | Admitting: Physical Therapy

## 2014-12-03 DIAGNOSIS — G610 Guillain-Barre syndrome: Secondary | ICD-10-CM | POA: Insufficient documentation

## 2014-12-03 DIAGNOSIS — R29898 Other symptoms and signs involving the musculoskeletal system: Secondary | ICD-10-CM | POA: Diagnosis not present

## 2014-12-03 DIAGNOSIS — Z789 Other specified health status: Secondary | ICD-10-CM

## 2014-12-03 DIAGNOSIS — R6889 Other general symptoms and signs: Secondary | ICD-10-CM | POA: Insufficient documentation

## 2014-12-03 DIAGNOSIS — R269 Unspecified abnormalities of gait and mobility: Secondary | ICD-10-CM | POA: Diagnosis not present

## 2014-12-03 DIAGNOSIS — Z658 Other specified problems related to psychosocial circumstances: Secondary | ICD-10-CM | POA: Diagnosis not present

## 2014-12-03 DIAGNOSIS — R2689 Other abnormalities of gait and mobility: Secondary | ICD-10-CM | POA: Diagnosis not present

## 2014-12-03 NOTE — Patient Instructions (Signed)
Functional Quadriceps: Sit to Stand   Sit on edge of chair, feet flat on floor. Stand upright, extending knees fully. Repeat __10__ times per set. Do __1__ sets per session. Do __2__ sessions per day.  http://orth.exer.us/735   Copyright  VHI. All rights reserved.        LONG ARC QUAD - LAQ - HIGH SEAT  While seated with your knee in a bent position, slowly straighten your knee as you raise your foot upwards as shown.   Hold your leg out at full extension for 3-5 seconds before you let it back down. Repeat 10 times each leg, twice a day.      SQUATS - ELASTIC BAND  Stand up with your walker and a chair behind you. Bend your knees, SLOWLY, while holding onto the walker and very SLOWLY come back up, making sure your knees do not hyperextend. Repeat 5 VERY SLOWLY with good control, twice a day.  Toe / Heel Raise (IN SITTING)   Gently rock back on heels and raise toes. Then rock forward on toes and raise heels. Repeat sequence _15___ times per session. Do __2__ sessions per day.  Copyright  VHI. All rights reserved.

## 2014-12-03 NOTE — Therapy (Signed)
[facility] [address] [place], [place], [address] Phone: [phone]   Fax:  [phone]  Physical Therapy Evaluation  Patient Details  Name: Crystal Stein MRN: [id number] Date of Birth: Jul 27, 1970 Referring Provider:  Shelle Iron, MD  Encounter Date: 12/03/2014      PT End of Session - 12/03/14 1652    Visit Number 1   Number of Visits 20   Date for PT Re-Evaluation 12/31/14   Authorization Type Medicaid (waiting on potential authorization for 20 units)   Authorization Time Period 12/03/14 to 02/02/15   PT Start Time 1347   PT Stop Time 1435   PT Time Calculation (min) 48 min   Activity Tolerance Patient tolerated treatment well   Behavior During Therapy [facility] for tasks assessed/performed      Past Medical History  Diagnosis Date  . Anxiety   . Depression   . Asthma   . 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 COPD (chronic obstructive pulmonary disease)   . GERD (gastroesophageal reflux disease)   . Bipolar disorder   . Blood transfusion 2006  . History of cardiac catheterization July 2015    Angiographically normal coronary arteries with distal tortuosity  . NSTEMI (non-ST elevated myocardial infarction)     Not secondary to obstructive CAD or obvious plaque rupture, possibly vasospasm    Past Surgical History  Procedure Laterality Date  . Tennis elbow release  04/27/06    Left; Romeo Apple, [facility]  . Multiple tooth extractions  10/07    Full upper  . Orif proximal humerus fracture  08/06    Left,  . Dilation and curettage of uterus  June 2012    Failed ablation  . Tubal ligation  June 2012    F[place]  . Bipolar disorder    . Hysteroscopy  01/27/2011    Procedure: HYSTEROSCOPY;  Surgeon: Tilda Burrow, MD;  Location: [facility];  Service: Gynecology;  Laterality: N/A;  . Vaginal hysterectomy  05/19/2011    Procedure: HYSTERECTOMY VAGINAL;  Surgeon: Tilda Burrow, MD;  Location: [facility];  Service:  Gynecology;  Laterality: N/A;  . Orif wrist fracture  06/17/2012    Procedure: OPEN REDUCTION INTERNAL FIXATION (ORIF) WRIST FRACTURE;  Surgeon: Vickki Hearing, MD;  Location: [facility];  Service: Orthopedics;  Laterality: Left;  . Carpal tunnel release  06/17/2012    Procedure: CARPAL TUNNEL RELEASE;  Surgeon: Vickki Hearing, MD;  Location: [facility];  Service: Orthopedics;  Laterality: Left;  . Left heart catheterization with coronary angiogram N/A 01/11/2014    Procedure: LEFT HEART CATHETERIZATION WITH CORONARY ANGIOGRAM;  Surgeon: Marykay Lex, MD;  Location: [facility] CATH LAB;  Service: Cardiovascular;  Laterality: N/A;    There were no vitals filed for this visit.  Visit Diagnosis:  GBS (Guillain-Barre syndrome) - Plan: PT plan of care cert/re-cert  Abnormality of gait - Plan: PT plan of care cert/re-cert  Bilateral leg weakness - Plan: PT plan of care cert/re-cert  Decreased functional activity tolerance - Plan: PT plan of care cert/re-cert  Difficulty navigating stairs - Plan: PT plan of care cert/re-cert  Poor balance - Plan: PT plan of care cert/re-cert      Subjective Assessment - 12/03/14 1352    Subjective Altered sensation in hands and feet, still having trouble with insomnia. Not staying very active recently, still living with parents and does remain active within the house.    Pertinent History Patient diagnosed  with Alene Mires in 2015 and received rehabilitation at [facility] including transfer/gait training. Does have a cardiac history as well as history of shoulder surgery.    Patient Stated Goals get rid of walker, eventually try to get rid of assistive devices for walking    Currently in Pain? No/denies            [facility] PT Assessment - 12/03/14 0001    Assessment   Medical Diagnosis Guillain Barre/gait abnormality    Onset Date/Surgical Date --  Summer 2015   Next MD Visit not sure when    Precautions   Precautions Other (comment)   cardiac history   Restrictions   Weight Bearing Restrictions No   Balance Screen   Has the patient fallen in the past 6 months Yes   How many times? was walking in gravel and fell onto elbows    Has the patient had a decrease in activity level because of a fear of falling?  Yes   Is the patient reluctant to leave their home because of a fear of falling?  Yes   Observation/Other Assessments   Observations TUG 24.7, 25.7, 24.15    Strength   Overall Strength Comments Core, upper and lower, approx 3-/5   Right Hip Flexion 3+/5   Right Hip Extension 3-/5   Right Hip ABduction 3-/5   Left Hip Flexion 3+/5   Left Hip Extension 3-/5   Left Hip ABduction 3/5   Right Knee Flexion 3/5   Right Knee Extension 4/5   Left Knee Flexion 4/5   Left Knee Extension 4/5   Right Ankle Dorsiflexion 3/5   Left Ankle Dorsiflexion 3/5   Bed Mobility   Rolling Right 7: Independent   Rolling Left 7: Independent   Right Sidelying to Sit 7: Independent   Left Sidelying to Sit 7: Independent   Transfers   Sit to Stand 6: Modified independent (Device/Increase time)   Stand to Sit 6: Modified independent (Device/Increase time)   Stand Pivot Transfers 6: Modified independent (Device/Increase time)   Ambulation/Gait   Ambulation/Gait Yes   Ambulation/Gait Assistance 6: Modified independent (Device/Increase time)   Ambulation Distance (Feet) 470 Feet  6 minute walk test   Assistive device Standard walker   Gait Pattern Step-through pattern;Decreased hip/knee flexion - right;Decreased hip/knee flexion - left;Right genu recurvatum;Left genu recurvatum;Trendelenburg;Decreased trunk rotation;Trunk flexed;Narrow base of support;Poor foot clearance - left;Poor foot clearance - right   Ambulation Surface Level   Stairs Yes   Stairs Assistance 4: Min guard   Stair Management Technique Step to pattern   Number of Stairs 7   Height of Stairs 4   Gait Comments bilateral knee hyperextension noted especially on  stairs likely due to quad and generalized weakness                            PT Education - 12/03/14 1652    Education provided Yes   Education Details prognosis, plan of care moving forward, HEP, Medicaid    Person(s) Educated Patient;Parent(s)   Methods Explanation;Handout   Comprehension Verbalized understanding;Returned demonstration;Need further instruction          PT Short Term Goals - 12/03/14 1657    PT SHORT TERM GOAL #1   Title Patient will demonstrate bilateral lower extremity strength of at least 4-/5 and proximal muscle strength of at least 4/5    Time 5   Period Weeks   Status New  PT SHORT TERM GOAL #2   Title Patient will be able to ambulate at least 851ft during 6 minute walk test with LRAD    Time 5   Period Weeks   Status New   PT SHORT TERM GOAL #3   Title Patient's balance to have improved to the point that she is able to complete Berg balance test and score at least 45   Time 5   Period Weeks   Status New   PT SHORT TERM GOAL #4   Title Patient will be able to ascend/descent full flight of stairs with U railng and minimal hyperextension of bilateral lower extremities, minimal unsteadiness    Time 5   Period Weeks   Status New   PT SHORT TERM GOAL #5   Title Patient to be independent in correctly and consistently performing appropriate HEP, to be advanced PRN    Time 5   Period Weeks   Status New           PT Long Term Goals - 12/03/14 1659    PT LONG TERM GOAL #1   Title Patient will be able to ambulate at least 847ft with single point cane, no unsteadiness or loss of balance, low fall risk    Time 10   Period Weeks   Status New   PT LONG TERM GOAL #2   Title Patient to be able to complete TUG test in under 15 seconds with single point cane, no unsteadiness and low fall risk    Time 10   Period Weeks   Status New   PT LONG TERM GOAL #3   Title Patient to be able to complete Berg balance test with score of at least  50   Time 10   Period Weeks   Status New   PT LONG TERM GOAL #4   Title Patient to be able to ascend/descend full flight of stairs with no railngs, single point cane, no unsteadiness and minimal fatigue    Time 10   Period Weeks   Status New   PT LONG TERM GOAL #5   Title Patient will be able to ambulate unlimited distances over uneven surfaces with single point cane with minimal unsteadiness and low fall risk throughout    Time 10   Period Weeks   Status New               Plan - 12/03/14 1653    Clinical Impression Statement Patient presents with generalized weakness, reduced functional activity tolerance, reduced balance, reduced ability to safely ascend/descend stairs and ambulate on uneven surfaces, and poor balance reaction skills. SHe is currently on the spectrum of recovery from [facility] syndrome and states that her major challenge recently has been trying to walk on uneven surfaces such as gravel although she would like to try to progress to the point where she is able to ambulate without an assistive device, if possible. Patient will benefit from skilled PT services in order to address these deficits and assist her in reachign an optimal level of function.    Pt will benefit from skilled therapeutic intervention in order to improve on the following deficits Abnormal gait;Decreased endurance;Impaired sensation;Decreased activity tolerance;Decreased knowledge of use of DME;Decreased strength;Decreased balance;Decreased mobility;Difficulty walking;Impaired perceived functional ability;Decreased coordination;Decreased safety awareness;Postural dysfunction   Rehab Potential Good   PT Frequency 2x / week   PT Duration Other (comment)  10 weeks    PT Treatment/Interventions ADLs/Self Care Home Management;DME Instruction;Gait training;Stair training;Functional  mobility training;Therapeutic activities;Therapeutic exercise;Balance training;Neuromuscular re-education;Patient/family  education;Manual techniques;Energy conservation   PT Next Visit Plan review HEP and goals; functional strengthening with focus on quads and hip extensors, gait and stair training, balance training    PT Home Exercise Plan given   Consulted and Agree with Plan of Care Patient;Family member/caregiver   Family Member Consulted father          Problem List Patient Active Problem List   Diagnosis Date Noted  . Marijuana smoker 04/09/2014  . Depression with anxiety 02/21/2014  . Neuropathic pain syndrome (non-herpetic) 02/21/2014  . Orthostatic hypotension 02/21/2014  . 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 Guillain Barr syndrome 01/21/2014  . Quadriplegia and quadriparesis 01/21/2014  . Bilateral leg weakness 01/20/2014  . Hyperkalemia 01/19/2014  . UTI (urinary tract infection) 01/19/2014  . Hypokalemia 01/18/2014  . 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 Acute encephalopathy 01/17/2014  . Substance abuse 01/17/2014  . Sinus tachycardia 01/17/2014  . Elevated transaminase level 01/17/2014  . Metabolic acidosis 01/17/2014  . Chest pressure 01/16/2014  . Chest pain 01/16/2014  . Tobacco abuse 01/12/2014  . Dyslipidemia 01/12/2014  . NSTEMI (non-ST elevated myocardial infarction) 01/10/2014  . Left ankle sprain 11/28/2012  . Carpal tunnel syndrome of left wrist 11/15/2012  . Left ankle instability 11/15/2012  . Muscle weakness (generalized) 08/19/2012  . Wrist fracture 08/15/2012  . Wrist pain, left 07/01/2012  . Edema of hand 07/01/2012  . Radius distal fracture 06/16/2012  . Ankle sprain 11/04/2011  . OTHER ABNORMAL BLOOD CHEMISTRY 01/22/2009  . LIVER FUNCTION TESTS, ABNORMAL, HX OF 01/16/2009  . ANEMIA 09/13/2008  . DIARRHEA NOS 09/13/2008  . ABDOMINAL PAIN, GENERALIZED 09/13/2008  . ABDOMINAL PAIN, LOWER 09/13/2008  . ABNORMAL TRANSAMINASE, (LFT'S) 09/13/2008  . RUQ PAIN 08/17/2008  . ACUTE SINUSITIS, UNSPECIFIED 07/23/2008  . ACUTE BRONCHITIS 07/23/2008   . LATERAL EPICONDYLITIS 04/25/2008  . CUBITAL TUNNEL SYNDROME 02/07/2008  . Pain in joint, upper arm 02/07/2008  . CLOSED FRACTURE OF SURGICAL NECK OF HUMERUS 02/07/2008  . BIPOLAR DISORDER UNSPECIFIED 12/02/2007  . ANXIETY, CHRONIC 12/02/2007  . NECK PAIN, CHRONIC 12/02/2007    Nedra Hai PT, DPT [phone]  [facility] [address] [place], [place], [address] Phone: [phone]   Fax:  [phone]

## 2014-12-12 ENCOUNTER — Ambulatory Visit (HOSPITAL_COMMUNITY): Payer: Medicaid Other | Admitting: Physical Therapy

## 2014-12-12 DIAGNOSIS — Z789 Other specified health status: Secondary | ICD-10-CM

## 2014-12-12 DIAGNOSIS — G610 Guillain-Barre syndrome: Secondary | ICD-10-CM

## 2014-12-12 DIAGNOSIS — R269 Unspecified abnormalities of gait and mobility: Secondary | ICD-10-CM

## 2014-12-12 DIAGNOSIS — R2689 Other abnormalities of gait and mobility: Secondary | ICD-10-CM

## 2014-12-12 DIAGNOSIS — R29898 Other symptoms and signs involving the musculoskeletal system: Secondary | ICD-10-CM

## 2014-12-12 DIAGNOSIS — R6889 Other general symptoms and signs: Secondary | ICD-10-CM

## 2014-12-12 NOTE — Therapy (Addendum)
[facility] [address] [place], [place], [address] Phone: [phone]   Fax:  [phone]  Physical Therapy Treatment  Patient Details  Name: Crystal Stein MRN: [id number] Date of Birth: December 05, 1970 Referring Provider:  Shelle Iron, MD  Encounter Date: 12/12/2014      PT End of Session - 12/12/14 1319    Visit Number 1   Number of Visits 4   Date for PT Re-Evaluation 12/31/14   Authorization Type Medicaid only 3 visits approved not counting evaluation   Authorization Time Period 12/03/14 to 02/02/15   Authorization - Visit Number 2   Authorization - Number of Visits 4   PT Start Time 1104   PT Stop Time 1158   PT Time Calculation (min) 54 min   Equipment Utilized During Treatment Gait belt   Activity Tolerance Patient tolerated treatment well   Behavior During Therapy [facility] for tasks assessed/performed      Past Medical History  Diagnosis Date  . Anxiety   . Depression   . Asthma   . 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 COPD (chronic obstructive pulmonary disease)   . GERD (gastroesophageal reflux disease)   . Bipolar disorder   . Blood transfusion 2006  . History of cardiac catheterization July 2015    Angiographically normal coronary arteries with distal tortuosity  . NSTEMI (non-ST elevated myocardial infarction)     Not secondary to obstructive CAD or obvious plaque rupture, possibly vasospasm    Past Surgical History  Procedure Laterality Date  . Tennis elbow release  04/27/06    Left; Romeo Apple, [facility]  . Multiple tooth extractions  10/07    Full upper  . Orif proximal humerus fracture  08/06    Left,  . Dilation and curettage of uterus  June 2012    Failed ablation  . Tubal ligation  June 2012    Ferguson  . Bipolar disorder    . Hysteroscopy  01/27/2011    Procedure: HYSTEROSCOPY;  Surgeon: Tilda Burrow, MD;  Location: [facility];  Service: Gynecology;  Laterality: N/A;  . Vaginal  hysterectomy  05/19/2011    Procedure: HYSTERECTOMY VAGINAL;  Surgeon: Tilda Burrow, MD;  Location: [facility];  Service: Gynecology;  Laterality: N/A;  . Orif wrist fracture  06/17/2012    Procedure: OPEN REDUCTION INTERNAL FIXATION (ORIF) WRIST FRACTURE;  Surgeon: Vickki Hearing, MD;  Location: [facility];  Service: Orthopedics;  Laterality: Left;  . Carpal tunnel release  06/17/2012    Procedure: CARPAL TUNNEL RELEASE;  Surgeon: Vickki Hearing, MD;  Location: [facility];  Service: Orthopedics;  Laterality: Left;  . Left heart catheterization with coronary angiogram N/A 01/11/2014    Procedure: LEFT HEART CATHETERIZATION WITH CORONARY ANGIOGRAM;  Surgeon: Marykay Lex, MD;  Location: [facility] CATH LAB;  Service: Cardiovascular;  Laterality: N/A;    There were no vitals filed for this visit.  Visit Diagnosis:  GBS (Guillain-Barre syndrome)  Abnormality of gait  Bilateral leg weakness  Decreased functional activity tolerance  Difficulty navigating stairs  Poor balance      Subjective Assessment - 12/12/14 1324    Subjective PT states she's been doing exericses with her mother.  States she is trying to walk more.  Frustrated wtih the lack of sensation in her feet.                          [facility]  Adult PT Treatment/Exercise - 12/12/14 0001    Knee/Hip Exercises: Aerobic   Nustep level 3 hills #3 LE only 10 minutes   Knee/Hip Exercises: Standing   Functional Squat 10 reps   Other Standing Knee Exercises high marching 10 reps   Knee/Hip Exercises: Seated   Marching Limitations heel and toe raise 10 reps   Knee/Hip Exercises: Supine   Bridges 2 sets;10 reps   Straight Leg Raises Both;2 sets;10 reps   Knee/Hip Exercises: Sidelying   Hip ABduction Both;10 reps;2 sets   Knee/Hip Exercises: Prone   Hamstring Curl 2 sets;10 reps   Hip Extension 2 sets;10 reps;Both                PT Education - 12/12/14 1358    Education provided Yes   Education Details  written copy of initial evaluation with explanation of measurements and goals.  Updated HEP   Person(s) Educated Patient   Methods Explanation;Handout   Comprehension Verbalized understanding;Returned demonstration;Verbal cues required;Tactile cues required;Need further instruction          PT Short Term Goals - 12/03/14 1657    PT SHORT TERM GOAL #1   Title Patient will demonstrate bilateral lower extremity strength of at least 4-/5 and proximal muscle strength of at least 4/5    Time 5   Period Weeks   Status New   PT SHORT TERM GOAL #2   Title Patient will be able to ambulate at least 885ft during 6 minute walk test with LRAD    Time 5   Period Weeks   Status New   PT SHORT TERM GOAL #3   Title Patient's balance to have improved to the point that she is able to complete Berg balance test and score at least 45   Time 5   Period Weeks   Status New   PT SHORT TERM GOAL #4   Title Patient will be able to ascend/descent full flight of stairs with U railng and minimal hyperextension of bilateral lower extremities, minimal unsteadiness    Time 5   Period Weeks   Status New   PT SHORT TERM GOAL #5   Title Patient to be independent in correctly and consistently performing appropriate HEP, to be advanced PRN    Time 5   Period Weeks   Status New           PT Long Term Goals - 12/03/14 1659    PT LONG TERM GOAL #1   Title Patient will be able to ambulate at least 870ft with single point cane, no unsteadiness or loss of balance, low fall risk    Time 10   Period Weeks   Status New   PT LONG TERM GOAL #2   Title Patient to be able to complete TUG test in under 15 seconds with single point cane, no unsteadiness and low fall risk    Time 10   Period Weeks   Status New   PT LONG TERM GOAL #3   Title Patient to be able to complete Berg balance test with score of at least 50   Time 10   Period Weeks   Status New   PT LONG TERM GOAL #4   Title Patient to be able to  ascend/descend full flight of stairs with no railngs, single point cane, no unsteadiness and minimal fatigue    Time 10   Period Weeks   Status New   PT LONG TERM GOAL #5   Title  Patient will be able to ambulate unlimited distances over uneven surfaces with single point cane with minimal unsteadiness and low fall risk throughout    Time 10   Period Weeks   Status New               Plan - 12/12/14 1320    Clinical Impression Statement Established exericses and updated HEP today.  Pt unable to complete heel and toe raises in standing at this point due to extreme weakness.  PT also tends to hyperextend knees with standing and ambulation indicating weak hamstrings.  Pt able to complete 2 sets of mat exericses and given writeen instructions to continue at home.  Pt given written copy of initial evaluation with explanation of measurements and goals.    PT Frequency 1x / week   PT Duration 4 weeks  10 weeks    PT Next Visit Plan continue to update HEP daily with progression of exericses in standing decreasing AD as able.  Increase balance and stair ability. Add lunges, static balance actvities and sit to stand next visit.    PT Home Exercise Plan given   Consulted and Agree with Plan of Care Patient;Family member/caregiver   Family Member Consulted father         Problem List Patient Active Problem List   Diagnosis Date Noted  . Marijuana smoker 04/09/2014  . Depression with anxiety 02/21/2014  . Neuropathic pain syndrome (non-herpetic) 02/21/2014  . Orthostatic hypotension 02/21/2014  . 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 Guillain Barr syndrome 01/21/2014  . Quadriplegia and quadriparesis 01/21/2014  . Bilateral leg weakness 01/20/2014  . Hyperkalemia 01/19/2014  . UTI (urinary tract infection) 01/19/2014  . Hypokalemia 01/18/2014  . 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 Acute encephalopathy 01/17/2014  . Substance abuse 01/17/2014  . Sinus tachycardia  01/17/2014  . Elevated transaminase level 01/17/2014  . Metabolic acidosis 01/17/2014  . Chest pressure 01/16/2014  . Chest pain 01/16/2014  . Tobacco abuse 01/12/2014  . Dyslipidemia 01/12/2014  . NSTEMI (non-ST elevated myocardial infarction) 01/10/2014  . Left ankle sprain 11/28/2012  . Carpal tunnel syndrome of left wrist 11/15/2012  . Left ankle instability 11/15/2012  . Muscle weakness (generalized) 08/19/2012  . Wrist fracture 08/15/2012  . Wrist pain, left 07/01/2012  . Edema of hand 07/01/2012  . Radius distal fracture 06/16/2012  . Ankle sprain 11/04/2011  . OTHER ABNORMAL BLOOD CHEMISTRY 01/22/2009  . LIVER FUNCTION TESTS, ABNORMAL, HX OF 01/16/2009  . ANEMIA 09/13/2008  . DIARRHEA NOS 09/13/2008  . ABDOMINAL PAIN, GENERALIZED 09/13/2008  . ABDOMINAL PAIN, LOWER 09/13/2008  . ABNORMAL TRANSAMINASE, (LFT'S) 09/13/2008  . RUQ PAIN 08/17/2008  . ACUTE SINUSITIS, UNSPECIFIED 07/23/2008  . ACUTE BRONCHITIS 07/23/2008  . LATERAL EPICONDYLITIS 04/25/2008  . CUBITAL TUNNEL SYNDROME 02/07/2008  . Pain in joint, upper arm 02/07/2008  . CLOSED FRACTURE OF SURGICAL NECK OF HUMERUS 02/07/2008  . BIPOLAR DISORDER UNSPECIFIED 12/02/2007  . ANXIETY, CHRONIC 12/02/2007  . NECK PAIN, CHRONIC 12/02/2007    Lurena Nida, PTA/CLT [phone]  12/12/2014, 1:59 PM  [facility] [address] [place], [place], [address] Phone: [phone]   Fax:  [phone]

## 2014-12-12 NOTE — Patient Instructions (Signed)
(  Clinic) Knee: Flexion / Hamstring Curl - Prone   Lie on stomach.  Pull foot toward buttocks. Repeat _10___ times per set. Do __2__ sets per session.    HIP / KNEE: Extension - Prone   Squeeze glutes. Raise leg up. Keep knee straight. _10__ reps per set, __2_ sets per day   Abduction: Side Leg Lift (Eccentric) - Side-Lying   Lie on side. Lift top leg slightly higher than shoulder level. Keep top leg straight with body, toes pointing forward. Slowly lower for 3-5 seconds. __10_ reps per set, __2_ sets per day,    [facility] small of back into mat, maintain pelvic tilt, roll up one vertebrae at a time. Focus on engaging posterior hip muscles. Hold for __5__ breaths. Repeat __10__ times.  Straight Leg Raise   Tighten stomach and slowly raise locked right leg __12__ inches from floor. Repeat _10___ times per set. Do ___2_ sets per session.    Marching In-Place   Standing straight, alternate bringing knees toward trunk. Arms swing alternately. Do _2__ sets. Do __10_ times per day.     Stand with legs hip-width apart. Inhaling, bend knees trying to keep knees over ankles, as if to sit on a chair and extend arms in front, wrists slightly lower than shoulders. Hold position for ___ breaths. Exhaling, stand up. Repeat _10__ times. Do _2__ times per day.  Copyright  VHI. All rights reserved.

## 2014-12-21 ENCOUNTER — Ambulatory Visit (HOSPITAL_COMMUNITY): Payer: Medicaid Other | Attending: Family Medicine | Admitting: Physical Therapy

## 2014-12-21 DIAGNOSIS — R2689 Other abnormalities of gait and mobility: Secondary | ICD-10-CM | POA: Insufficient documentation

## 2014-12-21 DIAGNOSIS — Z789 Other specified health status: Secondary | ICD-10-CM

## 2014-12-21 DIAGNOSIS — R29898 Other symptoms and signs involving the musculoskeletal system: Secondary | ICD-10-CM | POA: Insufficient documentation

## 2014-12-21 DIAGNOSIS — R6889 Other general symptoms and signs: Secondary | ICD-10-CM

## 2014-12-21 DIAGNOSIS — G610 Guillain-Barre syndrome: Secondary | ICD-10-CM | POA: Insufficient documentation

## 2014-12-21 DIAGNOSIS — R269 Unspecified abnormalities of gait and mobility: Secondary | ICD-10-CM | POA: Insufficient documentation

## 2014-12-21 DIAGNOSIS — Z658 Other specified problems related to psychosocial circumstances: Secondary | ICD-10-CM | POA: Insufficient documentation

## 2014-12-21 NOTE — Therapy (Signed)
[facility] [address] [place], [place], [address] Phone: [phone]   Fax:  [phone]  Physical Therapy Treatment  Patient Details  Name: Crystal Stein MRN: [id number] Date of Birth: 03/14/1971 Referring Provider:  Shelle IronSistasis, Jim, MD  Encounter Date: 12/21/2014      PT End of Session - 12/21/14 1057    Visit Number 3   Number of Visits 4   Date for PT Re-Evaluation 12/31/14   Authorization Type Medicaid only 3 visits approved not counting evaluation   Authorization Time Period 12/03/14 to 02/02/15   Authorization - Visit Number 3   Authorization - Number of Visits 4   PT Start Time 1020   PT Stop Time 1102   PT Time Calculation (min) 42 min   Activity Tolerance Patient tolerated treatment well   Behavior During Therapy [facility] for tasks assessed/performed      Past Medical History  Diagnosis Date  . Anxiety   . Depression   . Asthma   . 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 COPD (chronic obstructive pulmonary disease)   . GERD (gastroesophageal reflux disease)   . Bipolar disorder   . Blood transfusion 2006  . History of cardiac catheterization July 2015    Angiographically normal coronary arteries with distal tortuosity  . NSTEMI (non-ST elevated myocardial infarction)     Not secondary to obstructive CAD or obvious plaque rupture, possibly vasospasm    Past Surgical History  Procedure Laterality Date  . Tennis elbow release  04/27/06    Left; Romeo AppleHarrison, [facility]  . Multiple tooth extractions  10/07    Full upper  . Orif proximal humerus fracture  08/06    Left,  . Dilation and curettage of uterus  June 2012    Failed ablation  . Tubal ligation  June 2012    Ferguson  . Bipolar disorder    . Hysteroscopy  01/27/2011    Procedure: HYSTEROSCOPY;  Surgeon: Tilda BurrowJohn V Ferguson, MD;  Location: [facility];  Service: Gynecology;  Laterality: N/A;  . Vaginal hysterectomy  05/19/2011    Procedure: HYSTERECTOMY  VAGINAL;  Surgeon: Tilda BurrowJohn V Ferguson, MD;  Location: [facility];  Service: Gynecology;  Laterality: N/A;  . Orif wrist fracture  06/17/2012    Procedure: OPEN REDUCTION INTERNAL FIXATION (ORIF) WRIST FRACTURE;  Surgeon: Vickki HearingStanley E Harrison, MD;  Location: [facility];  Service: Orthopedics;  Laterality: Left;  . Carpal tunnel release  06/17/2012    Procedure: CARPAL TUNNEL RELEASE;  Surgeon: Vickki HearingStanley E Harrison, MD;  Location: [facility];  Service: Orthopedics;  Laterality: Left;  . Left heart catheterization with coronary angiogram N/A 01/11/2014    Procedure: LEFT HEART CATHETERIZATION WITH CORONARY ANGIOGRAM;  Surgeon: Marykay Lexavid W Harding, MD;  [facility];  Service: Cardiovascular;  Laterality: N/A;    There were no vitals filed for this visit.  Visit Diagnosis:  GBS (Guillain-Barre syndrome)  Abnormality of gait  Bilateral leg weakness  Decreased functional activity tolerance  Difficulty navigating stairs  Poor balance      Subjective Assessment - 12/21/14 1022    Subjective Patient states she has been doing HEP every day but feels that she does not do the exercises as well as she does at PT clinic.    Pertinent History Patient diagnosed with Alene MiresGuillain Barre in 2015 and [facility] including transfer/gait training. Does have a cardiac history as well as history of shoulder surgery.  Currently in Pain? No/denies                         [facility] Adult PT Treatment/Exercise - 12/21/14 0001    Knee/Hip Exercises: Aerobic   Nustep level 3 hills #3 LE only 10 minutes   Knee/Hip Exercises: Standing   Functional Squat 10 reps   Functional Squat Limitations isometric holds 3 seconds   Gait Training Focus on trying not to hyperextend knees 2x79ft   Other Standing Knee Exercises Sit to stand 1x10, no hands   Other Standing Knee Exercises Heel raises with walker, focus on not hyperextending stance knee; 3D hip excursions 1x10   Knee/Hip  Exercises: Seated   Other Seated Knee/Hip Exercises Pelvic walks at edge of mat table x5    Knee/Hip Exercises: Supine   Bridges Both;15 reps;2 sets   Straight Leg Raises Both;1 set;10 reps   Other Supine Knee/Hip Exercises Bent knee ranges 1x10 each leg    Knee/Hip Exercises: Sidelying   Clams 1x15 each leg, red TB                 PT Education - 12/21/14 1057    Education provided Yes   Education Details educated to perform HEP on bed rather than floor at home    Person(s) Educated Patient   Methods Explanation   Comprehension Verbalized understanding          PT Short Term Goals - 12/03/14 1657    PT SHORT TERM GOAL #1   Title Patient will demonstrate bilateral lower extremity strength of at least 4-/5 and proximal muscle strength of at least 4/5    Time 5   Period Weeks   Status New   PT SHORT TERM GOAL #2   Title Patient will be able to ambulate at least 832ft during 6 minute walk test with LRAD    Time 5   Period Weeks   Status New   PT SHORT TERM GOAL #3   Title Patient's balance to have improved to the point that she is able to complete Berg balance test and score at least 45   Time 5   Period Weeks   Status New   PT SHORT TERM GOAL #4   Title Patient will be able to ascend/descent full flight of stairs with U railng and minimal hyperextension of bilateral lower extremities, minimal unsteadiness    Time 5   Period Weeks   Status New   PT SHORT TERM GOAL #5   Title Patient to be independent in correctly and consistently performing appropriate HEP, to be advanced PRN    Time 5   Period Weeks   Status New           PT Long Term Goals - 12/03/14 1659    PT LONG TERM GOAL #1   Title Patient will be able to ambulate at least 834ft with single point cane, no unsteadiness or loss of balance, low fall risk    Time 10   Period Weeks   Status New   PT LONG TERM GOAL #2   Title Patient to be able to complete TUG test in under 15 seconds with single  point cane, no unsteadiness and low fall risk    Time 10   Period Weeks   Status New   PT LONG TERM GOAL #3   Title Patient to be able to complete Berg balance test with score of at least 50  Time 10   Period Weeks   Status New   PT LONG TERM GOAL #4   Title Patient to be able to ascend/descend full flight of stairs with no railngs, single point cane, no unsteadiness and minimal fatigue    Time 10   Period Weeks   Status New   PT LONG TERM GOAL #5   Title Patient will be able to ambulate unlimited distances over uneven surfaces with single point cane with minimal unsteadiness and low fall risk throughout    Time 10   Period Weeks   Status New               Plan - 12/21/14 1057    Clinical Impression Statement Patient reported that she is having trouble finding space at home to do HEP; advised patient to do exercises in bed and at edge of bed rather than trying to get up off of the floor. Continued functional exercises and gait training today with rest breaks as needed. Also continued Nustep.  Did not update HEP today as patient has reported difficulty keeping up with it at home recently.    Pt will benefit from skilled therapeutic intervention in order to improve on the following deficits Abnormal gait;Decreased endurance;Impaired sensation;Decreased activity tolerance;Decreased knowledge of use of DME;Decreased strength;Decreased balance;Decreased mobility;Difficulty walking;Impaired perceived functional ability;Decreased coordination;Decreased safety awareness;Postural dysfunction   Rehab Potential Good   PT Frequency Other (comment)  one more session    PT Duration Other (comment)  one more session   PT Next Visit Plan update HEP to include balance exercises; DC due to being out of approved Medicaid visits    PT Home Exercise Plan given   Consulted and Agree with Plan of Care Patient;Family member/caregiver        Problem List Patient Active Problem List   Diagnosis  Date Noted  . Marijuana smoker 04/09/2014  . Depression with anxiety 02/21/2014  . Neuropathic pain syndrome (non-herpetic) 02/21/2014  . Orthostatic hypotension 02/21/2014  . 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 Guillain Barr syndrome 01/21/2014  . Quadriplegia and quadriparesis 01/21/2014  . Bilateral leg weakness 01/20/2014  . Hyperkalemia 01/19/2014  . UTI (urinary tract infection) 01/19/2014  . Hypokalemia 01/18/2014  . 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 Acute encephalopathy 01/17/2014  . Substance abuse 01/17/2014  . Sinus tachycardia 01/17/2014  . Elevated transaminase level 01/17/2014  . Metabolic acidosis 01/17/2014  . Chest pressure 01/16/2014  . Chest pain 01/16/2014  . Tobacco abuse 01/12/2014  . Dyslipidemia 01/12/2014  . NSTEMI (non-ST elevated myocardial infarction) 01/10/2014  . Left ankle sprain 11/28/2012  . Carpal tunnel syndrome of left wrist 11/15/2012  . Left ankle instability 11/15/2012  . Muscle weakness (generalized) 08/19/2012  . Wrist fracture 08/15/2012  . Wrist pain, left 07/01/2012  . Edema of hand 07/01/2012  . Radius distal fracture 06/16/2012  . Ankle sprain 11/04/2011  . OTHER ABNORMAL BLOOD CHEMISTRY 01/22/2009  . LIVER FUNCTION TESTS, ABNORMAL, HX OF 01/16/2009  . ANEMIA 09/13/2008  . DIARRHEA NOS 09/13/2008  . ABDOMINAL PAIN, GENERALIZED 09/13/2008  . ABDOMINAL PAIN, LOWER 09/13/2008  . ABNORMAL TRANSAMINASE, (LFT'S) 09/13/2008  . RUQ PAIN 08/17/2008  . ACUTE SINUSITIS, UNSPECIFIED 07/23/2008  . ACUTE BRONCHITIS 07/23/2008  . LATERAL EPICONDYLITIS 04/25/2008  . CUBITAL TUNNEL SYNDROME 02/07/2008  . Pain in joint, upper arm 02/07/2008  . CLOSED FRACTURE OF SURGICAL NECK OF HUMERUS 02/07/2008  . BIPOLAR DISORDER UNSPECIFIED 12/02/2007  . ANXIETY, CHRONIC 12/02/2007  . NECK PAIN, CHRONIC 12/02/2007  Nedra Hai PT, DPT [phone]  [facility] [address]  [place], [place], [address] Phone: [phone]   Fax:  [phone]

## 2014-12-28 NOTE — Addendum Note (Signed)
Addended by: Milinda PointerUNGER, KRISTEN E on: 12/28/2014 08:22 AM   Modules accepted: Orders

## 2014-12-31 ENCOUNTER — Encounter (HOSPITAL_COMMUNITY): Payer: Medicaid Other | Admitting: Physical Therapy

## 2015-01-07 ENCOUNTER — Ambulatory Visit (HOSPITAL_COMMUNITY): Payer: Medicaid Other

## 2015-01-07 DIAGNOSIS — R29898 Other symptoms and signs involving the musculoskeletal system: Secondary | ICD-10-CM

## 2015-01-07 DIAGNOSIS — G610 Guillain-Barre syndrome: Secondary | ICD-10-CM | POA: Diagnosis not present

## 2015-01-07 DIAGNOSIS — Z789 Other specified health status: Secondary | ICD-10-CM

## 2015-01-07 DIAGNOSIS — R6889 Other general symptoms and signs: Secondary | ICD-10-CM

## 2015-01-07 DIAGNOSIS — R2689 Other abnormalities of gait and mobility: Secondary | ICD-10-CM

## 2015-01-07 DIAGNOSIS — R269 Unspecified abnormalities of gait and mobility: Secondary | ICD-10-CM

## 2015-01-07 NOTE — Therapy (Signed)
[facility] [address] [place], [place], [address] Phone: [phone]   Fax:  [phone]  Physical Therapy Treatment  Patient Details  Name: Crystal Stein MRN: [id number] Date of Birth: January 14, 1971 Referring Provider:  Charlotte Sanes, MD  Encounter Date: 01/07/2015      PT End of Session - 01/07/15 1146    Visit Number 4   Number of Visits 4   Date for PT Re-Evaluation 12/31/14   Authorization Type Medicaid only 3 visits approved not counting evaluation   Authorization Time Period 12/03/14 to 02/02/15   Authorization - Visit Number 4   Authorization - Number of Visits 4   PT Start Time 1017   PT Stop Time 1057   PT Time Calculation (min) 40 min   Equipment Utilized During Treatment Gait belt   Activity Tolerance Patient tolerated treatment well   Behavior During Therapy [facility] for tasks assessed/performed      Past Medical History  Diagnosis Date  . Anxiety   . Depression   . Asthma   . 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 COPD (chronic obstructive pulmonary disease)   . GERD (gastroesophageal reflux disease)   . Bipolar disorder   . Blood transfusion 2006  . History of cardiac catheterization July 2015    Angiographically normal coronary arteries with distal tortuosity  . NSTEMI (non-ST elevated myocardial infarction)     Not secondary to obstructive CAD or obvious plaque rupture, possibly vasospasm    Past Surgical History  Procedure Laterality Date  . Tennis elbow release  04/27/06    Left; Aline Brochure, [facility]  . Multiple tooth extractions  10/07    Full upper  . Orif proximal humerus fracture  08/06    Left,  . Dilation and curettage of uterus  June 2012    Failed ablation  . Tubal ligation  June 2012    Ferguson  . Bipolar disorder    . Hysteroscopy  01/27/2011    Procedure: HYSTEROSCOPY;  Surgeon: Jonnie Kind, MD;  Location: [facility];  Service: Gynecology;  Laterality: N/A;  . Vaginal  hysterectomy  05/19/2011    Procedure: HYSTERECTOMY VAGINAL;  Surgeon: Jonnie Kind, MD;  Location: [facility];  Service: Gynecology;  Laterality: N/A;  . Orif wrist fracture  06/17/2012    Procedure: OPEN REDUCTION INTERNAL FIXATION (ORIF) WRIST FRACTURE;  Surgeon: Carole Civil, MD;  Location: [facility];  Service: Orthopedics;  Laterality: Left;  . Carpal tunnel release  06/17/2012    Procedure: CARPAL TUNNEL RELEASE;  Surgeon: Carole Civil, MD;  Location: [facility];  Service: Orthopedics;  Laterality: Left;  . Left heart catheterization with coronary angiogram N/A 01/11/2014    Procedure: LEFT HEART CATHETERIZATION WITH CORONARY ANGIOGRAM;  Surgeon: Leonie Man, MD;  Location: [facility] CATH LAB;  Service: Cardiovascular;  Laterality: N/A;    There were no vitals filed for this visit.  Visit Diagnosis:  GBS (Guillain-Barre syndrome)  Abnormality of gait  Bilateral leg weakness  Decreased functional activity tolerance  Difficulty navigating stairs  Poor balance      Subjective Assessment - 01/07/15 1021    Subjective Patient states she has been doing HEP every day but feels that she does not do the exercises due to equipment limitations.    Pertinent History Patient diagnosed with Rosalee Kaufman in 2015 and received rehabilitation at [facility] including transfer/gait training. Does have a cardiac history as well as history  of shoulder surgery.    Patient Stated Goals get rid of walker, eventually try to get rid of assistive devices for walking                          [facility] Adult PT Treatment/Exercise - 01/07/15 1024    Ambulation/Gait   Ambulation/Gait Yes   Ambulation/Gait Assistance 4: Min guard   Ambulation Distance (Feet) 675 Feet   Assistive device Rolling walker   Gait Pattern Step-to pattern   Ambulation Surface Level   Stairs Yes   Stairs Assistance 4: Min assist   Stair Management Technique Step to pattern;Sideways;One rail  Right;One rail Left   Number of Stairs 4  1x4R; 1x4L    Height of Stairs 8   Static Standing Balance   Static Standing - Balance Support No upper extremity supported   Static Standing - Level of Assistance 4: Min assist   Static Standing - Comment/# of Minutes 4x30sec  normal stance- eyes open   Knee/Hip Exercises: Standing   Functional Squat 10 reps;2 sets  hands free sit-to stand from chair.   Other Standing Knee Exercises ankle dorsiflexion  2x15 green TB    Knee/Hip Exercises: Supine   Bridges Both;15 reps;2 sets   Straight Leg Raises Both;2 sets;15 reps   Knee/Hip Exercises: Sidelying   Clams 2x15 each leg, green TB                   PT Short Term Goals - 01/07/15 1207    PT SHORT TERM GOAL #1   Title Patient will demonstrate bilateral lower extremity strength of at least 4-/5 and proximal muscle strength of at least 4/5    Time 5   Period Weeks   Status Achieved   PT SHORT TERM GOAL #2   Title Patient will be able to ambulate at least 849f during 6 minute walk test with LRAD    Time 5   Period Weeks   Status On-going   PT SHORT TERM GOAL #3   Title Patient's balance to have improved to the point that she is able to complete Berg balance test and score at least 45   Time 5   Period Weeks   Status Not Met   PT SHORT TERM GOAL #4   Title Patient will be able to ascend/descent full flight of stairs with U railng and minimal hyperextension of bilateral lower extremities, minimal unsteadiness    Time 5   Period Weeks   Status Partially Met   PT SHORT TERM GOAL #5   Title Patient to be independent in correctly and consistently performing appropriate HEP, to be advanced PRN.    Time 5   Period Weeks   Status Partially Met           PT Long Term Goals - 01/07/15 1214    PT LONG TERM GOAL #1   Title Patient will be able to ambulate at least 8056fwith single point cane, no unsteadiness or loss of balance, low fall risk    Time 10   Period Weeks    Status Not Met   PT LONG TERM GOAL #2   Title Patient to be able to complete TUG test in under 15 seconds with single point cane, no unsteadiness and low fall risk    Time 10   Period Weeks   Status Not Met   PT LONG TERM GOAL #3   Title Patient to be  able to complete Berg balance test with score of at least 50   Time 10   Period Weeks   Status Not Met   PT LONG TERM GOAL #4   Title Patient to be able to ascend/descend full flight of stairs with no railngs, single point cane, no unsteadiness and minimal fatigue    Time 10   Period Weeks   Status Not Met   PT LONG TERM GOAL #5   Title Patient will be able to ambulate unlimited distances over uneven surfaces with single point cane with minimal unsteadiness and low fall risk throughout    Time 10   Period Weeks   Status Not Met               Plan - 01/07/15 1159    Clinical Impression Statement This is patient's final session. Pt continues to make mild progress as evidenced by improved indep in household amb and functional strength in BLE> Pt continues to demonstrate impairments in balance, c multiple LOB during balance training. Pt encouraged to continue attempting limited community distances outside of home when social support is available,.    Pt will benefit from skilled therapeutic intervention in order to improve on the following deficits Abnormal gait;Decreased endurance;Impaired sensation;Decreased activity tolerance;Decreased knowledge of use of DME;Decreased strength;Decreased balance;Decreased mobility;Difficulty walking;Impaired perceived functional ability;Decreased coordination;Decreased safety awareness;Postural dysfunction   Rehab Potential Good   PT Frequency Other (comment)   PT Duration Other (comment)   PT Treatment/Interventions ADLs/Self Care Home Management;DME Instruction;Gait training;Stair training;Functional mobility training;Therapeutic activities;Therapeutic exercise;Balance training;Neuromuscular  re-education;Patient/family education;Manual techniques;Energy conservation   PT Next Visit Plan This is final visit.    PT Home Exercise Plan continue s/p DC    Consulted and Agree with Plan of Care Patient        Problem List Patient Active Problem List   Diagnosis Date Noted  . Marijuana smoker 04/09/2014  . Depression with anxiety 02/21/2014  . Neuropathic pain syndrome (non-herpetic) 02/21/2014  . Orthostatic hypotension 02/21/2014  . 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 Guillain Barr syndrome 01/21/2014  . Quadriplegia and quadriparesis 01/21/2014  . Bilateral leg weakness 01/20/2014  . Hyperkalemia 01/19/2014  . UTI (urinary tract infection) 01/19/2014  . Hypokalemia 01/18/2014  . 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 Acute encephalopathy 01/17/2014  . Substance abuse 01/17/2014  . Sinus tachycardia 01/17/2014  . Elevated transaminase level 01/17/2014  . Metabolic acidosis 16/03/9603  . Chest pressure 01/16/2014  . Chest pain 01/16/2014  . Tobacco abuse 01/12/2014  . Dyslipidemia 01/12/2014  . NSTEMI (non-ST elevated myocardial infarction) 01/10/2014  . Left ankle sprain 11/28/2012  . Carpal tunnel syndrome of left wrist 11/15/2012  . Left ankle instability 11/15/2012  . Muscle weakness (generalized) 08/19/2012  . Wrist fracture 08/15/2012  . Wrist pain, left 07/01/2012  . Edema of hand 07/01/2012  . Radius distal fracture 06/16/2012  . Ankle sprain 11/04/2011  . OTHER ABNORMAL BLOOD CHEMISTRY 01/22/2009  . LIVER FUNCTION TESTS, ABNORMAL, HX OF 01/16/2009  . ANEMIA 09/13/2008  . DIARRHEA NOS 09/13/2008  . ABDOMINAL PAIN, GENERALIZED 09/13/2008  . ABDOMINAL PAIN, LOWER 09/13/2008  . ABNORMAL TRANSAMINASE, (LFT'S) 09/13/2008  . RUQ PAIN 08/17/2008  . ACUTE SINUSITIS, UNSPECIFIED 07/23/2008  . ACUTE BRONCHITIS 07/23/2008  . LATERAL EPICONDYLITIS 04/25/2008  . CUBITAL TUNNEL SYNDROME 02/07/2008  . Pain in joint, upper arm 02/07/2008  . CLOSED  FRACTURE OF SURGICAL NECK OF HUMERUS 02/07/2008  . BIPOLAR DISORDER UNSPECIFIED 12/02/2007  . ANXIETY, CHRONIC 12/02/2007  . NECK PAIN,  CHRONIC 12/02/2007   PHYSICAL THERAPY DISCHARGE SUMMARY  Visits from Start of Care: 4  Current functional level related to goals / functional outcomes: Pt is still performing household ambulation c RW at [place], requires Min guard for stairs at entry/exit of home c RW, and able to perform 75% of HEP indep.    Remaining deficits: Pt continues to demonstrate significant impairment in BLE functional strength, poor knee stability c genu recurvatum bilat, impaired balance c multiple LOB during intervention, and abnormal sensation/strength in BUE.    Education / Equipment: Unable to progress DME training prior to DC, limited by weakness and balance deficits.  Plan: Patient agrees to discharge.  Patient goals were not met. Patient is being discharged due to                                                     ?????no additional visits remaining.        Praneeth Bussey C 01/07/2015, 12:17 PM  12:21 PM  Etta Grandchild, PT, DPT [place] License # [id number]       [facility] [address] [place], [place], [address] Phone: [phone]   Fax:  [phone]

## 2021-09-14 DIAGNOSIS — W19XXXA Unspecified fall, initial encounter: Secondary | ICD-10-CM | POA: Diagnosis not present

## 2021-09-14 DIAGNOSIS — S72142A Displaced intertrochanteric fracture of left femur, initial encounter for closed fracture: Secondary | ICD-10-CM | POA: Diagnosis not present

## 2021-09-14 DIAGNOSIS — R000 Tachycardia, unspecified: Secondary | ICD-10-CM | POA: Diagnosis not present

## 2021-09-14 DIAGNOSIS — M25552 Pain in left hip: Secondary | ICD-10-CM | POA: Diagnosis not present

## 2021-09-14 DIAGNOSIS — I10 Essential (primary) hypertension: Secondary | ICD-10-CM | POA: Diagnosis not present

## 2021-09-14 DIAGNOSIS — S72102A Unspecified trochanteric fracture of left femur, initial encounter for closed fracture: Secondary | ICD-10-CM | POA: Diagnosis not present

## 2021-09-14 DIAGNOSIS — R110 Nausea: Secondary | ICD-10-CM | POA: Diagnosis not present

## 2021-09-15 DIAGNOSIS — R7309 Other abnormal glucose: Secondary | ICD-10-CM | POA: Diagnosis not present

## 2021-09-15 DIAGNOSIS — S72142A Displaced intertrochanteric fracture of left femur, initial encounter for closed fracture: Secondary | ICD-10-CM | POA: Diagnosis not present

## 2021-09-15 DIAGNOSIS — Z720 Tobacco use: Secondary | ICD-10-CM | POA: Diagnosis not present

## 2021-09-15 DIAGNOSIS — Z96612 Presence of left artificial shoulder joint: Secondary | ICD-10-CM | POA: Diagnosis not present

## 2021-09-15 DIAGNOSIS — Z87891 Personal history of nicotine dependence: Secondary | ICD-10-CM | POA: Diagnosis not present

## 2021-09-15 DIAGNOSIS — I252 Old myocardial infarction: Secondary | ICD-10-CM | POA: Diagnosis not present

## 2021-09-15 DIAGNOSIS — F17210 Nicotine dependence, cigarettes, uncomplicated: Secondary | ICD-10-CM | POA: Diagnosis not present

## 2021-09-15 DIAGNOSIS — D649 Anemia, unspecified: Secondary | ICD-10-CM | POA: Diagnosis not present

## 2021-09-15 DIAGNOSIS — E871 Hypo-osmolality and hyponatremia: Secondary | ICD-10-CM | POA: Diagnosis not present

## 2021-09-15 DIAGNOSIS — S72142D Displaced intertrochanteric fracture of left femur, subsequent encounter for closed fracture with routine healing: Secondary | ICD-10-CM | POA: Diagnosis not present

## 2021-09-15 DIAGNOSIS — R739 Hyperglycemia, unspecified: Secondary | ICD-10-CM | POA: Diagnosis not present

## 2021-09-15 DIAGNOSIS — D62 Acute posthemorrhagic anemia: Secondary | ICD-10-CM | POA: Diagnosis not present

## 2021-09-15 DIAGNOSIS — E876 Hypokalemia: Secondary | ICD-10-CM | POA: Diagnosis not present

## 2021-09-15 DIAGNOSIS — D539 Nutritional anemia, unspecified: Secondary | ICD-10-CM | POA: Diagnosis not present

## 2021-09-16 DIAGNOSIS — F17210 Nicotine dependence, cigarettes, uncomplicated: Secondary | ICD-10-CM | POA: Diagnosis not present

## 2021-09-16 DIAGNOSIS — F1090 Alcohol use, unspecified, uncomplicated: Secondary | ICD-10-CM | POA: Diagnosis not present

## 2021-09-16 DIAGNOSIS — S72142D Displaced intertrochanteric fracture of left femur, subsequent encounter for closed fracture with routine healing: Secondary | ICD-10-CM | POA: Diagnosis not present

## 2021-09-16 DIAGNOSIS — R7309 Other abnormal glucose: Secondary | ICD-10-CM | POA: Diagnosis not present

## 2021-09-16 DIAGNOSIS — D539 Nutritional anemia, unspecified: Secondary | ICD-10-CM | POA: Diagnosis not present

## 2021-09-16 DIAGNOSIS — E878 Other disorders of electrolyte and fluid balance, not elsewhere classified: Secondary | ICD-10-CM | POA: Diagnosis not present

## 2021-09-17 DIAGNOSIS — D539 Nutritional anemia, unspecified: Secondary | ICD-10-CM | POA: Diagnosis not present

## 2021-09-17 DIAGNOSIS — S72142D Displaced intertrochanteric fracture of left femur, subsequent encounter for closed fracture with routine healing: Secondary | ICD-10-CM | POA: Diagnosis not present

## 2021-09-17 DIAGNOSIS — R7309 Other abnormal glucose: Secondary | ICD-10-CM | POA: Diagnosis not present

## 2021-09-17 DIAGNOSIS — E878 Other disorders of electrolyte and fluid balance, not elsewhere classified: Secondary | ICD-10-CM | POA: Diagnosis not present

## 2021-09-17 DIAGNOSIS — F1090 Alcohol use, unspecified, uncomplicated: Secondary | ICD-10-CM | POA: Diagnosis not present

## 2021-09-17 DIAGNOSIS — F17210 Nicotine dependence, cigarettes, uncomplicated: Secondary | ICD-10-CM | POA: Diagnosis not present

## 2021-09-18 DIAGNOSIS — R7309 Other abnormal glucose: Secondary | ICD-10-CM | POA: Diagnosis not present

## 2021-09-18 DIAGNOSIS — S72142A Displaced intertrochanteric fracture of left femur, initial encounter for closed fracture: Secondary | ICD-10-CM | POA: Diagnosis not present

## 2021-09-18 DIAGNOSIS — F17210 Nicotine dependence, cigarettes, uncomplicated: Secondary | ICD-10-CM | POA: Diagnosis not present

## 2021-09-18 DIAGNOSIS — S72142D Displaced intertrochanteric fracture of left femur, subsequent encounter for closed fracture with routine healing: Secondary | ICD-10-CM | POA: Diagnosis not present

## 2021-09-18 DIAGNOSIS — E878 Other disorders of electrolyte and fluid balance, not elsewhere classified: Secondary | ICD-10-CM | POA: Diagnosis not present

## 2021-09-18 DIAGNOSIS — F1090 Alcohol use, unspecified, uncomplicated: Secondary | ICD-10-CM | POA: Diagnosis not present

## 2021-09-18 DIAGNOSIS — D539 Nutritional anemia, unspecified: Secondary | ICD-10-CM | POA: Diagnosis not present

## 2021-09-19 DIAGNOSIS — S72142A Displaced intertrochanteric fracture of left femur, initial encounter for closed fracture: Secondary | ICD-10-CM | POA: Diagnosis not present

## 2021-09-19 DIAGNOSIS — S72142D Displaced intertrochanteric fracture of left femur, subsequent encounter for closed fracture with routine healing: Secondary | ICD-10-CM | POA: Diagnosis not present

## 2021-09-20 DIAGNOSIS — S72142D Displaced intertrochanteric fracture of left femur, subsequent encounter for closed fracture with routine healing: Secondary | ICD-10-CM | POA: Diagnosis not present

## 2021-09-21 DIAGNOSIS — S72142D Displaced intertrochanteric fracture of left femur, subsequent encounter for closed fracture with routine healing: Secondary | ICD-10-CM | POA: Diagnosis not present

## 2021-09-22 DIAGNOSIS — S72142D Displaced intertrochanteric fracture of left femur, subsequent encounter for closed fracture with routine healing: Secondary | ICD-10-CM | POA: Diagnosis not present

## 2021-09-22 DIAGNOSIS — S72002A Fracture of unspecified part of neck of left femur, initial encounter for closed fracture: Secondary | ICD-10-CM | POA: Diagnosis not present

## 2021-09-22 DIAGNOSIS — S72142A Displaced intertrochanteric fracture of left femur, initial encounter for closed fracture: Secondary | ICD-10-CM | POA: Diagnosis not present

## 2021-09-23 DIAGNOSIS — S72002A Fracture of unspecified part of neck of left femur, initial encounter for closed fracture: Secondary | ICD-10-CM | POA: Diagnosis not present

## 2021-09-23 DIAGNOSIS — S72142D Displaced intertrochanteric fracture of left femur, subsequent encounter for closed fracture with routine healing: Secondary | ICD-10-CM | POA: Diagnosis not present

## 2021-09-23 DIAGNOSIS — F1090 Alcohol use, unspecified, uncomplicated: Secondary | ICD-10-CM | POA: Diagnosis not present

## 2021-09-23 DIAGNOSIS — D638 Anemia in other chronic diseases classified elsewhere: Secondary | ICD-10-CM | POA: Diagnosis not present

## 2021-09-23 DIAGNOSIS — S72142A Displaced intertrochanteric fracture of left femur, initial encounter for closed fracture: Secondary | ICD-10-CM | POA: Diagnosis not present

## 2021-09-24 DIAGNOSIS — S72002A Fracture of unspecified part of neck of left femur, initial encounter for closed fracture: Secondary | ICD-10-CM | POA: Diagnosis not present

## 2021-09-24 DIAGNOSIS — D638 Anemia in other chronic diseases classified elsewhere: Secondary | ICD-10-CM | POA: Diagnosis not present

## 2021-09-24 DIAGNOSIS — S72142D Displaced intertrochanteric fracture of left femur, subsequent encounter for closed fracture with routine healing: Secondary | ICD-10-CM | POA: Diagnosis not present

## 2021-09-24 DIAGNOSIS — F1090 Alcohol use, unspecified, uncomplicated: Secondary | ICD-10-CM | POA: Diagnosis not present

## 2021-09-24 DIAGNOSIS — S72142A Displaced intertrochanteric fracture of left femur, initial encounter for closed fracture: Secondary | ICD-10-CM | POA: Diagnosis not present

## 2021-09-25 DIAGNOSIS — F1090 Alcohol use, unspecified, uncomplicated: Secondary | ICD-10-CM | POA: Diagnosis not present

## 2021-09-25 DIAGNOSIS — D638 Anemia in other chronic diseases classified elsewhere: Secondary | ICD-10-CM | POA: Diagnosis not present

## 2021-09-25 DIAGNOSIS — S72142D Displaced intertrochanteric fracture of left femur, subsequent encounter for closed fracture with routine healing: Secondary | ICD-10-CM | POA: Diagnosis not present

## 2021-10-07 ENCOUNTER — Encounter: Payer: Self-pay | Admitting: *Deleted
